# Patient Record
Sex: Female | Born: 1935
Health system: Southern US, Community
[De-identification: ages and names within clinical notes are randomized; demographics above are authoritative.]

## PROBLEM LIST (undated history)

## (undated) DIAGNOSIS — I679 Cerebrovascular disease, unspecified: Secondary | ICD-10-CM

## (undated) DIAGNOSIS — I951 Orthostatic hypotension: Secondary | ICD-10-CM

## (undated) DIAGNOSIS — F32A Depression, unspecified: Secondary | ICD-10-CM

## (undated) DIAGNOSIS — I38 Endocarditis, valve unspecified: Secondary | ICD-10-CM

## (undated) DIAGNOSIS — R42 Dizziness and giddiness: Secondary | ICD-10-CM

## (undated) DIAGNOSIS — E7800 Pure hypercholesterolemia, unspecified: Secondary | ICD-10-CM

## (undated) DIAGNOSIS — I482 Chronic atrial fibrillation, unspecified: Secondary | ICD-10-CM

## (undated) DIAGNOSIS — K529 Noninfective gastroenteritis and colitis, unspecified: Secondary | ICD-10-CM

## (undated) DIAGNOSIS — Z7901 Long term (current) use of anticoagulants: Secondary | ICD-10-CM

## (undated) DIAGNOSIS — I10 Essential (primary) hypertension: Secondary | ICD-10-CM

## (undated) DIAGNOSIS — J189 Pneumonia, unspecified organism: Secondary | ICD-10-CM

## (undated) DIAGNOSIS — K5900 Constipation, unspecified: Secondary | ICD-10-CM

## (undated) DIAGNOSIS — I509 Heart failure, unspecified: Secondary | ICD-10-CM

## (undated) DIAGNOSIS — I4891 Unspecified atrial fibrillation: Secondary | ICD-10-CM

## (undated) DIAGNOSIS — M898X1 Other specified disorders of bone, shoulder: Secondary | ICD-10-CM

## (undated) DIAGNOSIS — B955 Unspecified streptococcus as the cause of diseases classified elsewhere: Secondary | ICD-10-CM

## (undated) DIAGNOSIS — Z9289 Personal history of other medical treatment: Secondary | ICD-10-CM

## (undated) DIAGNOSIS — Z952 Presence of prosthetic heart valve: Secondary | ICD-10-CM

## (undated) DIAGNOSIS — F419 Anxiety disorder, unspecified: Secondary | ICD-10-CM

## (undated) DIAGNOSIS — K219 Gastro-esophageal reflux disease without esophagitis: Secondary | ICD-10-CM

## (undated) DIAGNOSIS — F4323 Adjustment disorder with mixed anxiety and depressed mood: Secondary | ICD-10-CM

## (undated) DIAGNOSIS — I2510 Atherosclerotic heart disease of native coronary artery without angina pectoris: Secondary | ICD-10-CM

## (undated) DIAGNOSIS — M791 Myalgia, unspecified site: Secondary | ICD-10-CM

## (undated) DIAGNOSIS — I330 Acute and subacute infective endocarditis: Secondary | ICD-10-CM

## (undated) DIAGNOSIS — R0789 Other chest pain: Secondary | ICD-10-CM

## (undated) DIAGNOSIS — T826XXA Infection and inflammatory reaction due to cardiac valve prosthesis, initial encounter: Secondary | ICD-10-CM

## (undated) DIAGNOSIS — S92902A Unspecified fracture of left foot, initial encounter for closed fracture: Secondary | ICD-10-CM

## (undated) DIAGNOSIS — F329 Major depressive disorder, single episode, unspecified: Secondary | ICD-10-CM

## (undated) DIAGNOSIS — Z5181 Encounter for therapeutic drug level monitoring: Secondary | ICD-10-CM

## (undated) DIAGNOSIS — L309 Dermatitis, unspecified: Secondary | ICD-10-CM

## (undated) DIAGNOSIS — R413 Other amnesia: Secondary | ICD-10-CM

## (undated) DIAGNOSIS — I639 Cerebral infarction, unspecified: Secondary | ICD-10-CM

## (undated) DIAGNOSIS — R569 Unspecified convulsions: Secondary | ICD-10-CM

## (undated) DIAGNOSIS — R509 Fever, unspecified: Secondary | ICD-10-CM

## (undated) DIAGNOSIS — T50905A Adverse effect of unspecified drugs, medicaments and biological substances, initial encounter: Secondary | ICD-10-CM

## (undated) DIAGNOSIS — I25810 Atherosclerosis of coronary artery bypass graft(s) without angina pectoris: Secondary | ICD-10-CM

## (undated) DIAGNOSIS — M069 Rheumatoid arthritis, unspecified: Secondary | ICD-10-CM

## (undated) DIAGNOSIS — R9089 Other abnormal findings on diagnostic imaging of central nervous system: Secondary | ICD-10-CM

## (undated) HISTORY — DX: Acute and subacute infective endocarditis: I33.0

## (undated) HISTORY — DX: Endocarditis, valve unspecified: I38

## (undated) HISTORY — DX: Infection and inflammatory reaction due to cardiac valve prosthesis, initial encounter: T82.6XXA

## (undated) HISTORY — DX: Cerebrovascular disease, unspecified: I67.9

## (undated) HISTORY — DX: Unspecified convulsions: R56.9

## (undated) HISTORY — PX: CATARACT EXTRACTION W/ INTRAOCULAR LENS IMPLANT: SHX1309

## (undated) HISTORY — DX: Other abnormal findings on diagnostic imaging of central nervous system: R90.89

## (undated) HISTORY — PX: CARDIAC VALVE REPLACEMENT: SHX585

---
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
## 2000-05-27 DIAGNOSIS — I639 Cerebral infarction, unspecified: Secondary | ICD-10-CM

## 2000-05-27 HISTORY — DX: Cerebral infarction, unspecified: I63.9

## 2007-05-28 HISTORY — PX: CORONARY ANGIOPLASTY: SHX604

## 2008-03-27 HISTORY — PX: MITRAL VALVE REPAIR (MV)/CORONARY ARTERY BYPASS GRAFTING (CABG): SHX5983

## 2008-03-27 HISTORY — PX: AORTIC VALVE REPLACEMENT: SHX41

## 2008-03-27 HISTORY — PX: CORONARY ARTERY BYPASS GRAFT: SHX141

## 2008-04-13 ENCOUNTER — Telehealth (INDEPENDENT_AMBULATORY_CARE_PROVIDER_SITE_OTHER): Payer: Self-pay | Admitting: *Deleted

## 2008-04-15 ENCOUNTER — Encounter: Payer: Self-pay | Admitting: Internal Medicine

## 2008-05-31 ENCOUNTER — Ambulatory Visit: Payer: Self-pay | Admitting: Internal Medicine

## 2008-05-31 DIAGNOSIS — I509 Heart failure, unspecified: Secondary | ICD-10-CM

## 2008-05-31 DIAGNOSIS — K5900 Constipation, unspecified: Secondary | ICD-10-CM

## 2008-05-31 DIAGNOSIS — R0789 Other chest pain: Secondary | ICD-10-CM

## 2008-05-31 DIAGNOSIS — M791 Myalgia, unspecified site: Secondary | ICD-10-CM | POA: Insufficient documentation

## 2008-05-31 DIAGNOSIS — I10 Essential (primary) hypertension: Secondary | ICD-10-CM

## 2008-05-31 DIAGNOSIS — I2510 Atherosclerotic heart disease of native coronary artery without angina pectoris: Secondary | ICD-10-CM | POA: Insufficient documentation

## 2008-05-31 DIAGNOSIS — I482 Chronic atrial fibrillation, unspecified: Secondary | ICD-10-CM

## 2008-05-31 HISTORY — DX: Essential (primary) hypertension: I10

## 2008-05-31 HISTORY — DX: Chronic atrial fibrillation, unspecified: I48.20

## 2008-05-31 HISTORY — DX: Myalgia, unspecified site: M79.10

## 2008-05-31 HISTORY — DX: Constipation, unspecified: K59.00

## 2008-05-31 HISTORY — DX: Other chest pain: R07.89

## 2008-05-31 HISTORY — DX: Heart failure, unspecified: I50.9

## 2008-05-31 HISTORY — DX: Atherosclerotic heart disease of native coronary artery without angina pectoris: I25.10

## 2008-06-06 ENCOUNTER — Encounter: Payer: Self-pay | Admitting: Internal Medicine

## 2008-06-27 ENCOUNTER — Telehealth: Payer: Self-pay | Admitting: Internal Medicine

## 2008-07-14 ENCOUNTER — Encounter: Payer: Self-pay | Admitting: Internal Medicine

## 2008-09-13 ENCOUNTER — Telehealth: Payer: Self-pay | Admitting: Internal Medicine

## 2009-04-18 ENCOUNTER — Ambulatory Visit: Payer: Self-pay | Admitting: Internal Medicine

## 2009-04-25 ENCOUNTER — Telehealth: Payer: Self-pay | Admitting: Internal Medicine

## 2009-05-03 ENCOUNTER — Encounter: Payer: Self-pay | Admitting: Internal Medicine

## 2009-07-19 ENCOUNTER — Ambulatory Visit: Payer: Self-pay | Admitting: Internal Medicine

## 2012-03-26 ENCOUNTER — Ambulatory Visit: Payer: Self-pay | Admitting: Internal Medicine

## 2012-03-30 ENCOUNTER — Encounter: Payer: Self-pay | Admitting: Internal Medicine

## 2012-03-30 ENCOUNTER — Ambulatory Visit (INDEPENDENT_AMBULATORY_CARE_PROVIDER_SITE_OTHER): Payer: Self-pay | Admitting: Internal Medicine

## 2012-03-30 VITALS — BP 110/70 | HR 76 | Temp 97.1°F | Resp 16 | Wt 160.0 lb

## 2012-03-30 DIAGNOSIS — Z5181 Encounter for therapeutic drug level monitoring: Secondary | ICD-10-CM

## 2012-03-30 DIAGNOSIS — I509 Heart failure, unspecified: Secondary | ICD-10-CM

## 2012-03-30 DIAGNOSIS — I2510 Atherosclerotic heart disease of native coronary artery without angina pectoris: Secondary | ICD-10-CM

## 2012-03-30 DIAGNOSIS — I10 Essential (primary) hypertension: Secondary | ICD-10-CM

## 2012-03-30 DIAGNOSIS — Z7901 Long term (current) use of anticoagulants: Secondary | ICD-10-CM

## 2012-03-30 HISTORY — DX: Encounter for therapeutic drug level monitoring: Z51.81

## 2012-03-30 MED ORDER — IBUPROFEN 400 MG PO TABS: 400.0000 mg | ORAL_TABLET | Freq: Two times a day (BID) | ORAL | Status: DC | PRN

## 2012-03-30 MED ORDER — BISOPROLOL FUMARATE 5 MG PO TABS: 2.5000 mg | ORAL_TABLET | Freq: Every day | ORAL | Status: DC

## 2012-03-30 MED ORDER — RAMIPRIL 5 MG PO CAPS: 5.0000 mg | ORAL_CAPSULE | Freq: Every day | ORAL | Status: DC

## 2012-03-30 MED ORDER — ATORVASTATIN CALCIUM 10 MG PO TABS: 10.0000 mg | ORAL_TABLET | Freq: Every day | ORAL | Status: DC

## 2012-03-30 MED ORDER — TORSEMIDE 5 MG PO TABS: 5.0000 mg | ORAL_TABLET | Freq: Every day | ORAL | Status: DC

## 2012-03-30 MED ORDER — SPIRONOLACTONE 25 MG PO TABS: 25.0000 mg | ORAL_TABLET | Freq: Every day | ORAL | Status: DC

## 2012-03-30 MED ORDER — WARFARIN SODIUM 2.5 MG PO TABS: ORAL_TABLET | ORAL | Status: DC

## 2012-03-30 NOTE — Progress Notes (Signed)
  Subjective:    Patient ID: Alexis [name], female    DOB: 1934/01/13, 78 y.o.   MRN: [id number]  HPI C/o LLE pain x 3-4 d, resolved on Ibuprofen x 1 dose The patient presents for a follow-up of  chronic hypertension, chronic dyslipidemia, CAD controlled with medicines     Review of Systems  Constitutional: Negative for chills, activity change, appetite change, fatigue and unexpected weight change.  HENT: Negative for congestion, mouth sores and sinus pressure.   Eyes: Negative for visual disturbance.  Respiratory: Negative for cough and chest tightness.   Gastrointestinal: Negative for nausea and abdominal pain.  Genitourinary: Negative for frequency, difficulty urinating and vaginal pain.  Musculoskeletal: Negative for back pain and gait problem.  Skin: Negative for pallor and rash.  Neurological: Negative for dizziness, tremors, weakness, numbness and headaches.  Psychiatric/Behavioral: Negative for confusion and sleep disturbance.       Objective:   Physical Exam  Constitutional: She appears well-developed. No distress.       Obese  HENT:  Head: Normocephalic.  Right Ear: External ear normal.  Left Ear: External ear normal.  Nose: Nose normal.  Mouth/Throat: Oropharynx is clear and moist.  Eyes: Conjunctivae normal are normal. Pupils are equal, round, and reactive to light. Right eye exhibits no discharge. Left eye exhibits no discharge.  Neck: Normal range of motion. Neck supple. No JVD present. No tracheal deviation present. No thyromegaly present.  Cardiovascular: Normal rate, regular rhythm and normal heart sounds.   Pulmonary/Chest: No stridor. No respiratory distress. She has no wheezes.  Abdominal: Soft. Bowel sounds are normal. She exhibits no distension and no mass. There is no tenderness. There is no rebound and no guarding.  Musculoskeletal: She exhibits no edema and no tenderness.  Lymphadenopathy:    She has no cervical adenopathy.  Neurological: She  displays normal reflexes. No cranial nerve deficit. She exhibits normal muscle tone. Coordination normal.  Skin: No rash noted. No erythema.  Psychiatric: She has a normal mood and affect. Her behavior is normal. Judgment and thought content normal.           Assessment & Plan:

## 2012-03-30 NOTE — Progress Notes (Deleted)
Patient ID: Alexis [name], female   DOB: 09-21-1933, 78 y.o.   MRN: [id number]

## 2012-03-30 NOTE — Assessment & Plan Note (Signed)
Continue with current prescription therapy as reflected on the Med list.  

## 2012-04-07 ENCOUNTER — Other Ambulatory Visit: Payer: Self-pay | Admitting: *Deleted

## 2012-04-07 MED ORDER — TORSEMIDE 5 MG PO TABS: 5.0000 mg | ORAL_TABLET | Freq: Every day | ORAL | Status: DC

## 2012-04-10 ENCOUNTER — Other Ambulatory Visit: Payer: Self-pay | Admitting: *Deleted

## 2012-04-10 ENCOUNTER — Telehealth: Payer: Self-pay | Admitting: *Deleted

## 2012-04-10 MED ORDER — BISOPROLOL FUMARATE 5 MG PO TABS: 2.5000 mg | ORAL_TABLET | Freq: Every day | ORAL | Status: DC

## 2012-04-10 MED ORDER — SPIRONOLACTONE 25 MG PO TABS: 25.0000 mg | ORAL_TABLET | Freq: Every day | ORAL | Status: DC

## 2012-04-10 MED ORDER — WARFARIN SODIUM 2.5 MG PO TABS: ORAL_TABLET | ORAL | Status: DC

## 2012-04-10 MED ORDER — IBUPROFEN 400 MG PO TABS: 400.0000 mg | ORAL_TABLET | Freq: Two times a day (BID) | ORAL | Status: DC | PRN

## 2012-04-10 MED ORDER — RAMIPRIL 5 MG PO CAPS: 5.0000 mg | ORAL_CAPSULE | Freq: Every day | ORAL | Status: DC

## 2012-04-10 MED ORDER — ATORVASTATIN CALCIUM 10 MG PO TABS: 10.0000 mg | ORAL_TABLET | Freq: Every day | ORAL | Status: DC

## 2012-04-10 NOTE — Telephone Encounter (Signed)
Pharmacy states pt does not need to be on Coumadin and Ibuprofen.   And also the ramipril with spironolactone could increase her Kcl level. Please advise- does pt need all these meds?

## 2012-04-10 NOTE — Telephone Encounter (Signed)
Kristin informed per Dr. Alain Marion- ok to fill Ramipril and spironolactone. Pt can take ibuprofen prn once weekly but should not take it daily.

## 2012-04-13 ENCOUNTER — Telehealth: Payer: Self-pay | Admitting: Internal Medicine

## 2012-04-13 MED ORDER — DIGOXIN 250 MCG PO TABS: 0.1250 ug | ORAL_TABLET | Freq: Every day | ORAL | Status: DC

## 2012-05-08 ENCOUNTER — Telehealth: Payer: Self-pay | Admitting: Internal Medicine

## 2012-05-08 MED ORDER — DIGOXIN 250 MCG PO TABS: 0.1250 ug | ORAL_TABLET | Freq: Every day | ORAL | Status: DC

## 2012-05-08 NOTE — Telephone Encounter (Signed)
Rf sent

## 2012-05-08 NOTE — Telephone Encounter (Signed)
The patient's daughter called the triage line and is hoping to get a refill of her Digoxin .25mg  , thanks!

## 2012-06-12 ENCOUNTER — Telehealth: Payer: Self-pay | Admitting: *Deleted

## 2012-06-12 NOTE — Telephone Encounter (Signed)
Pt's daughter [name] calling requesting to have coumadin level checked here now. She hasn't had it checked since 02/2012. Jenny Reichmann, Can you call her please?

## 2012-06-15 NOTE — Telephone Encounter (Signed)
LMOM for pt or dtr to call coumadin clinic to schedule INR check.

## 2012-06-17 ENCOUNTER — Other Ambulatory Visit: Payer: Self-pay | Admitting: General Practice

## 2012-06-17 ENCOUNTER — Ambulatory Visit (INDEPENDENT_AMBULATORY_CARE_PROVIDER_SITE_OTHER): Payer: Self-pay | Admitting: General Practice

## 2012-06-17 DIAGNOSIS — Z7901 Long term (current) use of anticoagulants: Secondary | ICD-10-CM | POA: Insufficient documentation

## 2012-06-17 DIAGNOSIS — I2510 Atherosclerotic heart disease of native coronary artery without angina pectoris: Secondary | ICD-10-CM

## 2012-06-29 NOTE — Telephone Encounter (Signed)
x

## 2012-08-14 ENCOUNTER — Ambulatory Visit (INDEPENDENT_AMBULATORY_CARE_PROVIDER_SITE_OTHER): Payer: Self-pay | Admitting: General Practice

## 2012-08-14 DIAGNOSIS — Z7901 Long term (current) use of anticoagulants: Secondary | ICD-10-CM

## 2012-08-14 DIAGNOSIS — I2510 Atherosclerotic heart disease of native coronary artery without angina pectoris: Secondary | ICD-10-CM

## 2012-09-04 ENCOUNTER — Ambulatory Visit (INDEPENDENT_AMBULATORY_CARE_PROVIDER_SITE_OTHER): Payer: Self-pay | Admitting: General Practice

## 2012-09-04 DIAGNOSIS — Z7901 Long term (current) use of anticoagulants: Secondary | ICD-10-CM

## 2012-09-04 DIAGNOSIS — I2510 Atherosclerotic heart disease of native coronary artery without angina pectoris: Secondary | ICD-10-CM

## 2012-09-04 LAB — POCT INR: INR: 2.1

## 2012-09-15 ENCOUNTER — Other Ambulatory Visit: Payer: Self-pay | Admitting: *Deleted

## 2012-09-15 MED ORDER — DIGOXIN 250 MCG PO TABS: 0.1250 ug | ORAL_TABLET | Freq: Every day | ORAL | Status: DC

## 2013-04-16 ENCOUNTER — Encounter: Payer: Self-pay | Admitting: Internal Medicine

## 2013-04-16 ENCOUNTER — Ambulatory Visit (INDEPENDENT_AMBULATORY_CARE_PROVIDER_SITE_OTHER): Payer: Self-pay | Admitting: Internal Medicine

## 2013-04-16 VITALS — BP 122/64 | HR 68 | Temp 96.8°F | Resp 16 | Wt 162.0 lb

## 2013-04-16 DIAGNOSIS — Z23 Encounter for immunization: Secondary | ICD-10-CM

## 2013-04-16 DIAGNOSIS — I10 Essential (primary) hypertension: Secondary | ICD-10-CM

## 2013-04-16 DIAGNOSIS — I2510 Atherosclerotic heart disease of native coronary artery without angina pectoris: Secondary | ICD-10-CM

## 2013-04-16 DIAGNOSIS — I509 Heart failure, unspecified: Secondary | ICD-10-CM

## 2013-04-16 MED ORDER — MECLIZINE HCL 12.5 MG PO TABS: 12.5000 mg | ORAL_TABLET | Freq: Three times a day (TID) | ORAL | Status: DC | PRN

## 2013-04-16 MED ORDER — DIGOXIN 250 MCG PO TABS: 0.1250 ug | ORAL_TABLET | Freq: Every day | ORAL | Status: DC

## 2013-04-16 NOTE — Progress Notes (Signed)
  Subjective:    HPI   C/o LLE pain x 3-4 d, resolved on Ibuprofen x 1 dose The patient presents for a follow-up of  chronic hypertension, chronic dyslipidemia, CAD controlled with medicines     Review of Systems  Constitutional: Negative for chills, activity change, appetite change, fatigue and unexpected weight change.  HENT: Negative for congestion, mouth sores and sinus pressure.   Eyes: Negative for visual disturbance.  Respiratory: Negative for cough and chest tightness.   Gastrointestinal: Negative for nausea and abdominal pain.  Genitourinary: Negative for frequency, difficulty urinating and vaginal pain.  Musculoskeletal: Negative for back pain and gait problem.  Skin: Negative for pallor and rash.  Neurological: Negative for dizziness, tremors, weakness, numbness and headaches.  Psychiatric/Behavioral: Negative for confusion and sleep disturbance.       Objective:   Physical Exam  Constitutional: She appears well-developed. No distress.  Obese  HENT:  Head: Normocephalic.  Right Ear: External ear normal.  Left Ear: External ear normal.  Nose: Nose normal.  Mouth/Throat: Oropharynx is clear and moist.  Eyes: Conjunctivae are normal. Pupils are equal, round, and reactive to light. Right eye exhibits no discharge. Left eye exhibits no discharge.  Neck: Normal range of motion. Neck supple. No JVD present. No tracheal deviation present. No thyromegaly present.  Cardiovascular: Normal rate, regular rhythm and normal heart sounds.   Pulmonary/Chest: No stridor. No respiratory distress. She has no wheezes.  Abdominal: Soft. Bowel sounds are normal. She exhibits no distension and no mass. There is no tenderness. There is no rebound and no guarding.  Musculoskeletal: She exhibits no edema and no tenderness.  Lymphadenopathy:    She has no cervical adenopathy.  Neurological: She displays normal reflexes. No cranial nerve deficit. She exhibits normal muscle tone.  Coordination normal.  Skin: No rash noted. No erythema.  Psychiatric: She has a normal mood and affect. Her behavior is normal. Judgment and thought content normal.           Assessment & Plan:

## 2013-04-16 NOTE — Progress Notes (Signed)
Pre visit review using our clinic review tool, if applicable. No additional management support is needed unless otherwise documented below in the visit note. 

## 2013-04-17 NOTE — Assessment & Plan Note (Signed)
Continue with current prescription therapy as reflected on the Med list.  

## 2013-06-07 ENCOUNTER — Other Ambulatory Visit: Payer: Self-pay | Admitting: General Practice

## 2013-06-07 ENCOUNTER — Telehealth: Payer: Self-pay | Admitting: General Practice

## 2013-06-08 ENCOUNTER — Telehealth: Payer: Self-pay | Admitting: General Practice

## 2013-06-08 NOTE — Telephone Encounter (Signed)
[facility] for patient to return call to coumadin clinic @ [phone].

## 2013-06-11 NOTE — Telephone Encounter (Signed)
LMOM for patient to call office to schedule appointment for coumadin clinic.

## 2013-07-06 ENCOUNTER — Other Ambulatory Visit: Payer: Self-pay | Admitting: General Practice

## 2013-07-06 ENCOUNTER — Ambulatory Visit (INDEPENDENT_AMBULATORY_CARE_PROVIDER_SITE_OTHER): Payer: Self-pay | Admitting: General Practice

## 2013-07-06 DIAGNOSIS — Z5181 Encounter for therapeutic drug level monitoring: Secondary | ICD-10-CM

## 2013-07-06 DIAGNOSIS — I2510 Atherosclerotic heart disease of native coronary artery without angina pectoris: Secondary | ICD-10-CM

## 2013-07-06 LAB — POCT INR: INR: 2.4

## 2013-07-06 MED ORDER — WARFARIN SODIUM 2.5 MG PO TABS: ORAL_TABLET | ORAL | Status: DC

## 2013-07-06 NOTE — Progress Notes (Signed)
Pre-visit discussion using our clinic review tool. No additional management support is needed unless otherwise documented below in the visit note.  

## 2013-08-05 ENCOUNTER — Telehealth: Payer: Self-pay | Admitting: *Deleted

## 2013-08-05 MED ORDER — SPIRONOLACTONE 25 MG PO TABS: 25.0000 mg | ORAL_TABLET | Freq: Every day | ORAL | Status: DC

## 2013-08-05 NOTE — Telephone Encounter (Signed)
Daughter phoned requesting refill for aldactone.  Refilled per protocol.

## 2013-08-09 ENCOUNTER — Other Ambulatory Visit: Payer: Self-pay | Admitting: Internal Medicine

## 2013-08-13 ENCOUNTER — Telehealth: Payer: Self-pay

## 2013-08-13 NOTE — Telephone Encounter (Signed)
Ok next wk Thx

## 2013-08-13 NOTE — Telephone Encounter (Signed)
Phone call from patient's daughter [phone] stating patient is showing signs of dementia. She asks is you next available appt okay or do you want to see patient sooner? Please advise.

## 2013-08-16 ENCOUNTER — Ambulatory Visit (INDEPENDENT_AMBULATORY_CARE_PROVIDER_SITE_OTHER): Payer: Self-pay | Admitting: Internal Medicine

## 2013-08-16 ENCOUNTER — Encounter: Payer: Self-pay | Admitting: Internal Medicine

## 2013-08-16 VITALS — BP 122/84 | HR 43 | Temp 97.5°F | Resp 16 | Ht 59.0 in | Wt 153.0 lb

## 2013-08-16 DIAGNOSIS — I10 Essential (primary) hypertension: Secondary | ICD-10-CM

## 2013-08-16 DIAGNOSIS — I509 Heart failure, unspecified: Secondary | ICD-10-CM

## 2013-08-16 DIAGNOSIS — I2510 Atherosclerotic heart disease of native coronary artery without angina pectoris: Secondary | ICD-10-CM

## 2013-08-16 DIAGNOSIS — R413 Other amnesia: Secondary | ICD-10-CM

## 2013-08-16 HISTORY — DX: Other amnesia: R41.3

## 2013-08-16 NOTE — Assessment & Plan Note (Signed)
Mild. She is leaving for [place] tomorrow. Options to treat was discussed.

## 2013-08-16 NOTE — Assessment & Plan Note (Signed)
Compensated. Continue with current prescription therapy as reflected on the Med list.

## 2013-08-16 NOTE — Progress Notes (Signed)
   Subjective:    HPI  C/o memory loss  LLE pain x 3-4 d, resolved on Ibuprofen x 1 dose The patient presents for a follow-up of  chronic hypertension, chronic dyslipidemia, CAD controlled with medicines     Review of Systems  Constitutional: Negative for chills, activity change, appetite change, fatigue and unexpected weight change.  HENT: Negative for congestion, mouth sores and sinus pressure.   Eyes: Negative for visual disturbance.  Respiratory: Negative for cough and chest tightness.   Gastrointestinal: Negative for nausea and abdominal pain.  Genitourinary: Negative for frequency, difficulty urinating and vaginal pain.  Musculoskeletal: Negative for back pain and gait problem.  Skin: Negative for pallor and rash.  Neurological: Negative for dizziness, tremors, weakness, numbness and headaches.  Psychiatric/Behavioral: Negative for confusion and sleep disturbance.  memory loss     Objective:   Physical Exam  Constitutional: She appears well-developed. No distress.  Obese  HENT:  Head: Normocephalic.  Right Ear: External ear normal.  Left Ear: External ear normal.  Nose: Nose normal.  Mouth/Throat: Oropharynx is clear and moist.  Eyes: Conjunctivae are normal. Pupils are equal, round, and reactive to light. Right eye exhibits no discharge. Left eye exhibits no discharge.  Neck: Normal range of motion. Neck supple. No JVD present. No tracheal deviation present. No thyromegaly present.  Cardiovascular: Normal rate, regular rhythm and normal heart sounds.   Pulmonary/Chest: No stridor. No respiratory distress. She has no wheezes.  Abdominal: Soft. Bowel sounds are normal. She exhibits no distension and no mass. There is no tenderness. There is no rebound and no guarding.  Musculoskeletal: She exhibits no edema and no tenderness.  Lymphadenopathy:    She has no cervical adenopathy.  Neurological: She displays normal reflexes. No cranial nerve deficit. She exhibits normal  muscle tone. Coordination normal.  Skin: No rash noted. No erythema.  Psychiatric: She has a normal mood and affect. Her behavior is normal. Thought content normal.  Serial 7s WNL Clock drawing impaired A/o/c x3           Assessment & Plan:

## 2013-08-16 NOTE — Assessment & Plan Note (Signed)
Continue with current prescription therapy as reflected on the Med list.  

## 2013-08-16 NOTE — Patient Instructions (Signed)
Return when you are back in [place]

## 2013-08-16 NOTE — Progress Notes (Signed)
Pre visit review using our clinic review tool, if applicable. No additional management support is needed unless otherwise documented below in the visit note. 

## 2013-08-17 ENCOUNTER — Telehealth: Payer: Self-pay | Admitting: Internal Medicine

## 2013-08-17 NOTE — Telephone Encounter (Signed)
Relevant patient education mailed to patient.  

## 2013-08-31 ENCOUNTER — Ambulatory Visit (INDEPENDENT_AMBULATORY_CARE_PROVIDER_SITE_OTHER): Payer: Self-pay | Admitting: General Practice

## 2013-08-31 DIAGNOSIS — I2510 Atherosclerotic heart disease of native coronary artery without angina pectoris: Secondary | ICD-10-CM

## 2013-08-31 DIAGNOSIS — Z5181 Encounter for therapeutic drug level monitoring: Secondary | ICD-10-CM

## 2013-08-31 NOTE — Progress Notes (Signed)
Pre visit review using our clinic review tool, if applicable. No additional management support is needed unless otherwise documented below in the visit note. 

## 2014-06-20 ENCOUNTER — Other Ambulatory Visit (INDEPENDENT_AMBULATORY_CARE_PROVIDER_SITE_OTHER): Payer: Self-pay

## 2014-06-20 ENCOUNTER — Ambulatory Visit (INDEPENDENT_AMBULATORY_CARE_PROVIDER_SITE_OTHER): Payer: Self-pay | Admitting: Internal Medicine

## 2014-06-20 VITALS — BP 128/80 | HR 49 | Wt 158.0 lb

## 2014-06-20 DIAGNOSIS — I951 Orthostatic hypotension: Secondary | ICD-10-CM

## 2014-06-20 DIAGNOSIS — K529 Noninfective gastroenteritis and colitis, unspecified: Secondary | ICD-10-CM

## 2014-06-20 DIAGNOSIS — E871 Hypo-osmolality and hyponatremia: Secondary | ICD-10-CM

## 2014-06-20 HISTORY — DX: Orthostatic hypotension: I95.1

## 2014-06-20 HISTORY — DX: Noninfective gastroenteritis and colitis, unspecified: K52.9

## 2014-06-20 LAB — BASIC METABOLIC PANEL
BUN: 65 mg/dL — ABNORMAL HIGH (ref 6–23)
CO2: 18 mEq/L — ABNORMAL LOW (ref 19–32)
CREATININE: 1.66 mg/dL — AB (ref 0.40–1.20)
Calcium: 9.5 mg/dL (ref 8.4–10.5)
Chloride: 93 mEq/L — ABNORMAL LOW (ref 96–112)
GFR: 31.74 mL/min — ABNORMAL LOW (ref >60.00)
Glucose, Bld: 117 mg/dL — ABNORMAL HIGH (ref 70–99)
Potassium: 4.6 mEq/L (ref 3.5–5.1)
SODIUM: 121 meq/L — AB (ref 135–145)

## 2014-06-20 LAB — MAGNESIUM: MAGNESIUM: 2.4 mg/dL (ref 1.5–2.5)

## 2014-06-20 LAB — CBC
HCT: 37.6 % (ref 36.0–46.0)
Hemoglobin: 13.4 g/dL (ref 12.0–15.0)
MCHC: 35.5 g/dL (ref 30.0–36.0)
MCV: 83.4 fl (ref 78.0–100.0)
Platelets: 368 10*3/uL (ref 150.0–400.0)
RBC: 4.51 Mil/uL (ref 3.87–5.11)
RDW: 12.8 % (ref 11.5–15.5)
WBC: 9.8 10*3/uL (ref 4.0–10.5)

## 2014-06-20 MED ORDER — VITAMIN D 1000 UNITS PO TABS: 1000.0000 [IU] | ORAL_TABLET | Freq: Every day | ORAL | Status: AC

## 2014-06-20 NOTE — Patient Instructions (Addendum)
  Hold Ramipril and Spironolactone x 3 days ?????? (Diarrhea) ?????? -- ??? ?????? ?????? ????. ?????? ????? ????? ???????? ????????????? ?????????. ????????????? ???????? ? ?????? ??? ? ??????? ?????, ??????? ?? ???, ?????????? ??????????????, ???? ????? ??????????? ?????? ???????. ?????? ???????? ????????? ??????-?? ???????????, ???????? ????? -- ????????????????? ????????. ? ??????????? ??????? ?????? ???????????? 2-3 ???. ?????? ?????? ?????? ????? ???????????? ??????, ???? ??? ???????? ????????? ?????????? ???????????. ????? ?????? ?????? ? ???????????? ? ???????????? ????? ??? ????, ????? ????????? ??????????? ??? ?? ????????? ?????????? ???????? ??????.  ???????  ????????? ???????????????? ???????:   ???????? ?????????-????????? ??????, ????????, ????????, ?????????????, ??? ????????? ??????????.  ??????? ?????????? ??? ???????? ?? ???????? ???????.  ????? ????????? ??????????, ????????, ????????????, ????????????, ????????????.  ???????????? ? ???? ??????????? ?????? ? ????????.  ???????????? ???????????. ?????????? ?? ????? ? ???????? ????????   ?????????? ?????????? ??????????? ????????? ???????? (????????????): ????? ??????? ???????? ?????? ?????????? ???????? 1 ?????? (8 ?????) ????????. ?? ????? ????????, ??????? ???????? ??????? ?????? ??? ?????????? ???????, ????????? ????, ?????????????? ???????? ? ???????????? ???????. ???? ???? ?????? ?????????? ?????????? ??? ?????? ??????, ???? ?? ????? ??????????? ?????????? ????????. ????? ??????????? ???????????????? ???????, ??????? ????? ?????? ? ??????, ? ???????????? ??? ?? ?????????. ?? ?????? ?????????????? ?????????? ???????????????? ???????, ?????? ? ????????? ? ???? ????????? ??????????:  ?-? ?????? ????? ????.   ?????? ????? ??????? ????.  ? ?????? ????? ?????????? ????, ??????????? ?????? ?????.  1 ? ???????? ????? ??????.  1 ???? (34 ?????) ????.  ????????? ???????? ??????? ? ??????? ????? ??????? ????????? ?????  ?????????-???????? ????? (???). ????????????? ???????? ? ??????? ?? ??????? ????????? ? ???? ??? ??????:  ???????? ??????? ? ???????, ?????????? ??????.  ???????? ? ??????? ??????????? ?????????, ????????, ????? ?????? ? ?????, ?????, ???????, ?????-???????? ??????? ?? ???????? ???? ? ???????? ????.  ???????? ??????? ? ???????, ?????????? ????? ?????????????, ??? ????????, ???????? ? ????????.  ????????? ???????? ??????? ????? ?????? ??????????? ? ????????? ????:  ????????, ?????????? ???????, ????????, ???, ?????, ?????????? ???????, ???? ??? ??????, ???????, ??????? ???? ??????? ??????, ?????????? ?????????, ???????, ?????.  ??????.  ???????? ????.  ??????? ? ?????? ??????? ????????, ?????????? ??????????, ????? ????????? ???????????? ???????? ???????? ? ???, ????????, ?????? ? ????????????? ????????.  ????? ??????? ????????? ??????? ????????? ????? ???? ? ?????.  ?????????? ?????? ?????????? ?????? ?????????????? ??? ??????????? ?????????.  ?????????? ?????? ?????? ??? ?????????? ?????? ??? ???? ? ????? ??-?? ??????? ??????. ?????????? ?????????? ? ????? ??? ????????? ?????? ??????, ????:   ???????? ????????? ????????.  ???????? ??????????? ?????.  ? ????? ???????????? ?????, ??? ??????? ????? ??????? ? ??????????????.  ? ??? ?? ???? ?????????????? ?? ????????? 6-8 ?????, ??? ???? ?????????????? ? ????? ??????????? ???? ??? ????? ????? ??????? ?????.  ? ??? ????????? ???? ? ??????? ???????, ??????? ??????????? ??? ????????????.  ?? ?????????? ????????, ??????????????, ?????????? ???????? ??? ???????? ? ??????.  ? ??? ????????? ?????? ???????? ????.  ?????? ???????????.  ?????????? ??????????? ??? ?????????? ???????? ???????????? ?????? 2-3 ????.  ? ??? ???????? ?????????, ?/??? ???????? ???????? ??????????. ?????????, ??? ??:   ????????? ????????? ??????????.  ?????? ?????????????? ??????? ?? ??????????.  ??????????????? ?????????? ? ?????, ???? ??? ?? ??????????  ????? ??? ?????????? ????. Document Released: 03/10/2009 Document Revised: 04/29/2012 [facility] Patient Information 2015 [place], [place]. This information is not intended to replace advice given to you by your health care provider. Make sure you discuss any questions you have with your health care provider.

## 2014-06-20 NOTE — Progress Notes (Signed)
Pre visit review using our clinic review tool, if applicable. No additional management support is needed unless otherwise documented below in the visit note. 

## 2014-06-20 NOTE — Progress Notes (Signed)
   Subjective:    Diarrhea  This is a new problem. The current episode started in the past 7 days. The problem occurs 2 to 4 times per day. The problem has been resolved (stopped after 3 days). Associated symptoms include chills. Pertinent negatives include no abdominal pain, coughing, fever or headaches.  C/o nausea, poor appetite  F/u memory loss  LLE pain x 3-4 d, resolved on Ibuprofen x 1 dose The patient presents for a follow-up of  chronic hypertension, chronic dyslipidemia, CAD controlled with medicines  BP Readings from Last 3 Encounters:  06/20/14 128/80  08/16/13 122/84  04/16/13 122/64   BP supine 120/80, standing 100/70    Review of Systems  Constitutional: Positive for chills. Negative for fever, activity change, appetite change, fatigue and unexpected weight change.  HENT: Negative for congestion, mouth sores and sinus pressure.   Eyes: Negative for visual disturbance.  Respiratory: Negative for cough and chest tightness.   Gastrointestinal: Positive for diarrhea. Negative for nausea and abdominal pain.  Genitourinary: Negative for frequency, difficulty urinating and vaginal pain.  Musculoskeletal: Negative for back pain and gait problem.  Skin: Negative for pallor and rash.  Neurological: Negative for dizziness, tremors, weakness, numbness and headaches.  Psychiatric/Behavioral: Negative for confusion and sleep disturbance.  memory loss     Objective:   Physical Exam  Constitutional: She appears well-developed. No distress.  Obese  HENT:  Head: Normocephalic.  Right Ear: External ear normal.  Left Ear: External ear normal.  Nose: Nose normal.  Mouth/Throat: Oropharynx is clear and moist.  Eyes: Conjunctivae are normal. Pupils are equal, round, and reactive to light. Right eye exhibits no discharge. Left eye exhibits no discharge.  Neck: Normal range of motion. Neck supple. No JVD present. No tracheal deviation present. No thyromegaly present.   Cardiovascular: Normal rate, regular rhythm and normal heart sounds.   Pulmonary/Chest: No stridor. No respiratory distress. She has no wheezes.  Abdominal: Soft. Bowel sounds are normal. She exhibits no distension and no mass. There is no tenderness. There is no rebound and no guarding.  Musculoskeletal: She exhibits no edema or tenderness.  Lymphadenopathy:    She has no cervical adenopathy.  Neurological: She displays normal reflexes. No cranial nerve deficit. She exhibits normal muscle tone. Coordination normal.  Skin: No rash noted. No erythema.  Psychiatric: She has a normal mood and affect. Her behavior is normal. Thought content normal.  Serial 7s WNL Clock drawing impaired A/o/c x3   Lab Results  Component Value Date   WBC 9.8 06/20/2014   HGB 13.4 06/20/2014   HCT 37.6 06/20/2014   PLT 368.0 06/20/2014   GLUCOSE 117* 06/20/2014   NA 121* 06/20/2014   K 4.6 06/20/2014   CL 93* 06/20/2014   CREATININE 1.66* 06/20/2014   BUN 65* 06/20/2014   CO2 18* 06/20/2014   INR 2.4 07/06/2013         Assessment & Plan:

## 2014-06-20 NOTE — Assessment & Plan Note (Signed)
Likely viral 1/16 Hold Ramipril and Spironolactone x 3 days Labs

## 2014-06-21 ENCOUNTER — Telehealth: Payer: Self-pay | Admitting: *Deleted

## 2014-06-21 ENCOUNTER — Other Ambulatory Visit: Payer: Self-pay | Admitting: Internal Medicine

## 2014-06-21 DIAGNOSIS — Z5181 Encounter for therapeutic drug level monitoring: Secondary | ICD-10-CM

## 2014-06-21 DIAGNOSIS — E871 Hypo-osmolality and hyponatremia: Secondary | ICD-10-CM

## 2014-06-21 LAB — DIGOXIN LEVEL: Digoxin Level: 2.8 ng/mL (ref 0.8–2.0)

## 2014-06-21 NOTE — Telephone Encounter (Signed)
P/t's daughter informed

## 2014-06-21 NOTE — Telephone Encounter (Signed)
Pt's dogoxin is critically high at 2.8.  Per MD- stop Digoxin. And repeat lab in 1 week. Left mess for patient's daughter to call back.

## 2014-06-21 NOTE — Assessment & Plan Note (Signed)
1/16 due to diarrhea, meds I left a VM on dtr's  Repeat Labs in 1 wk

## 2014-07-05 ENCOUNTER — Other Ambulatory Visit (INDEPENDENT_AMBULATORY_CARE_PROVIDER_SITE_OTHER): Payer: Self-pay

## 2014-07-05 DIAGNOSIS — E871 Hypo-osmolality and hyponatremia: Secondary | ICD-10-CM

## 2014-07-05 DIAGNOSIS — Z5181 Encounter for therapeutic drug level monitoring: Secondary | ICD-10-CM

## 2014-07-05 LAB — BASIC METABOLIC PANEL
BUN: 33 mg/dL — ABNORMAL HIGH (ref 6–23)
CO2: 23 meq/L (ref 19–32)
CREATININE: 1.34 mg/dL — AB (ref 0.40–1.20)
Calcium: 9.7 mg/dL (ref 8.4–10.5)
Chloride: 106 mEq/L (ref 96–112)
GFR: 40.64 mL/min — AB (ref >60.00)
Glucose, Bld: 101 mg/dL — ABNORMAL HIGH (ref 70–99)
POTASSIUM: 4.4 meq/L (ref 3.5–5.1)
Sodium: 138 mEq/L (ref 135–145)

## 2014-07-06 ENCOUNTER — Telehealth: Payer: Self-pay | Admitting: Internal Medicine

## 2014-07-06 LAB — DIGOXIN LEVEL: DIGOXIN LVL: 0.1 ng/mL — AB (ref 0.8–2.0)

## 2014-07-06 NOTE — Telephone Encounter (Signed)
Left detailed message per lab result note that all labs are better and to call if any questions.

## 2014-07-06 NOTE — Telephone Encounter (Signed)
Patient has called back in regards to her lab results.

## 2014-07-06 NOTE — Telephone Encounter (Signed)
Patient is requesting a detailed message if she does not answer.

## 2014-07-07 ENCOUNTER — Telehealth: Payer: Self-pay | Admitting: Internal Medicine

## 2014-07-07 NOTE — Telephone Encounter (Signed)
pls see Mychart lab note/message Thx

## 2014-07-07 NOTE — Telephone Encounter (Signed)
With lab results that just came back, patient wants to know if digoxin (LANOXIN) 0.25 MG tablet should continue to be taken

## 2014-07-08 NOTE — Telephone Encounter (Signed)
Called pt daughter no answer LMOM with md response...Alexis [name]

## 2014-08-05 ENCOUNTER — Telehealth: Payer: Self-pay

## 2014-08-08 NOTE — Telephone Encounter (Signed)
Call to confirm status of flu; Patient stated she had the flu this year and declines for now.

## 2014-08-16 ENCOUNTER — Telehealth: Payer: Self-pay | Admitting: Internal Medicine

## 2014-08-16 NOTE — Telephone Encounter (Signed)
Pt daughter called in and would like her last 2 lab result mailed to home address

## 2014-08-16 NOTE — Telephone Encounter (Signed)
Mailed last labs done in January.Marland KitchenJohny [name]

## 2014-08-29 ENCOUNTER — Telehealth: Payer: Self-pay | Admitting: Internal Medicine

## 2014-08-29 MED ORDER — BISOPROLOL FUMARATE 5 MG PO TABS: 2.5000 mg | ORAL_TABLET | Freq: Every day | ORAL | Status: DC

## 2014-08-29 NOTE — Telephone Encounter (Signed)
Patient is requesting a refill of bisoprolol (ZEBETA) 5 MG tablet [id number]. Verified pharmacy is [facility] in [place]

## 2014-08-29 NOTE — Telephone Encounter (Signed)
Rf sent

## 2014-09-07 ENCOUNTER — Other Ambulatory Visit: Payer: Self-pay | Admitting: *Deleted

## 2014-09-07 MED ORDER — WARFARIN SODIUM 2.5 MG PO TABS: ORAL_TABLET | ORAL | Status: DC

## 2014-09-07 MED ORDER — SPIRONOLACTONE 25 MG PO TABS: 25.0000 mg | ORAL_TABLET | Freq: Every day | ORAL | Status: DC

## 2014-09-07 MED ORDER — ATORVASTATIN CALCIUM 10 MG PO TABS
10.0000 mg | ORAL_TABLET | Freq: Every day | ORAL | Status: DC
Start: 2014-09-07 — End: 2016-09-02

## 2016-04-05 ENCOUNTER — Ambulatory Visit: Payer: Self-pay | Admitting: Internal Medicine

## 2016-05-27 DIAGNOSIS — I38 Endocarditis, valve unspecified: Secondary | ICD-10-CM | POA: Insufficient documentation

## 2016-06-04 ENCOUNTER — Telehealth: Payer: Self-pay | Admitting: Internal Medicine

## 2016-06-04 NOTE — Telephone Encounter (Signed)
Daughter states she took patient to the ED today for chest pain.  Went to [facility].  States they told patient she had a muscle strain.  States patient has gotten worse since she has left the ED.  I have scheduled patient for the 19th but daughter is requesting Dr. Camila Li to try to work patient in sooner.  Please advise.

## 2016-06-04 NOTE — Telephone Encounter (Signed)
OK 11:30 on Thursday Thx

## 2016-06-05 NOTE — Telephone Encounter (Signed)
Got patient scheduled

## 2016-06-06 ENCOUNTER — Ambulatory Visit (INDEPENDENT_AMBULATORY_CARE_PROVIDER_SITE_OTHER): Payer: Self-pay | Admitting: Internal Medicine

## 2016-06-06 ENCOUNTER — Encounter: Payer: Self-pay | Admitting: Internal Medicine

## 2016-06-06 DIAGNOSIS — M898X1 Other specified disorders of bone, shoulder: Secondary | ICD-10-CM

## 2016-06-06 DIAGNOSIS — R0789 Other chest pain: Secondary | ICD-10-CM

## 2016-06-06 DIAGNOSIS — L309 Dermatitis, unspecified: Secondary | ICD-10-CM

## 2016-06-06 DIAGNOSIS — I2510 Atherosclerotic heart disease of native coronary artery without angina pectoris: Secondary | ICD-10-CM

## 2016-06-06 HISTORY — DX: Other specified disorders of bone, shoulder: M89.8X1

## 2016-06-06 HISTORY — DX: Dermatitis, unspecified: L30.9

## 2016-06-06 MED ORDER — TORSEMIDE 5 MG PO TABS: 2.5000 mg | ORAL_TABLET | Freq: Every day | ORAL | 11 refills | Status: DC

## 2016-06-06 MED ORDER — VITAMIN D3 50 MCG (2000 UT) PO CAPS: 2000.0000 [IU] | ORAL_CAPSULE | Freq: Every day | ORAL | 3 refills | Status: DC

## 2016-06-06 MED ORDER — LORATADINE 10 MG PO TABS: 10.0000 mg | ORAL_TABLET | Freq: Every day | ORAL | 3 refills | Status: DC

## 2016-06-06 MED ORDER — TRIAMCINOLONE ACETONIDE 0.5 % EX OINT: 1.0000 "application " | TOPICAL_OINTMENT | Freq: Two times a day (BID) | CUTANEOUS | 3 refills | Status: DC

## 2016-06-06 NOTE — Assessment & Plan Note (Signed)
L MSK after raking leaves No CP now Tylenol or T#3 prn

## 2016-06-06 NOTE — Assessment & Plan Note (Signed)
No angina - doing well

## 2016-06-06 NOTE — Progress Notes (Signed)
Pre visit review using our clinic review tool, if applicable. No additional management support is needed unless otherwise documented below in the visit note. 

## 2016-06-06 NOTE — Progress Notes (Signed)
Subjective:  Patient ID: Alexis [name], female    DOB: 03/14/1934  Age: 83 y.o. MRN: [id number]  CC: No chief complaint on file.   HPI Alexis [name] presents for L shoulder blade pain off and on x 1 mo. Worse w/shoulder ROMin the past few days. She was seen at the [facility] on 06/04/16. CXR was ok. EKG was ok however Alexis [name] does not recall it being done... No sx's now  Outpatient Medications Prior to Visit  Medication Sig Dispense Refill  . atorvastatin (LIPITOR) 10 MG tablet Take 1 tablet (10 mg total) by mouth daily. 90 tablet 3  . bisoprolol (ZEBETA) 5 MG tablet Take 0.5 tablets (2.5 mg total) by mouth daily. 90 tablet 1  . digoxin (LANOXIN) 0.25 MG tablet Take 0.5 tablets (125 mcg total) by mouth daily. 45 tablet 3  . ibuprofen (ADVIL,MOTRIN) 400 MG tablet Take 1 tablet (400 mg total) by mouth 2 (two) times daily as needed for pain. 60 tablet 3  . ramipril (ALTACE) 5 MG capsule Take 1 capsule (5 mg total) by mouth daily. 90 capsule 3  . spironolactone (ALDACTONE) 25 MG tablet Take 1 tablet (25 mg total) by mouth daily. 90 tablet 3  . warfarin (COUMADIN) 2.5 MG tablet Take as directed by anticoagulation clinic 90 tablet 0   No facility-administered medications prior to visit.     ROS Review of Systems  Constitutional: Negative for activity change, appetite change, chills, fatigue and unexpected weight change.  HENT: Negative for congestion, mouth sores and sinus pressure.   Eyes: Negative for visual disturbance.  Respiratory: Negative for cough and chest tightness.   Gastrointestinal: Negative for abdominal pain and nausea.  Genitourinary: Negative for difficulty urinating, frequency and vaginal pain.  Musculoskeletal: Negative for back pain and gait problem.  Skin: Positive for rash. Negative for pallor.  Neurological: Negative for dizziness, tremors, weakness, numbness and headaches.  Psychiatric/Behavioral: Negative for confusion and sleep disturbance.     Objective:  BP 100/60   Pulse 75   Wt 160 lb (72.6 kg)   SpO2 97%   BMI 32.32 kg/m   BP Readings from Last 3 Encounters:  06/06/16 100/60  06/20/14 128/80  08/16/13 122/84    Wt Readings from Last 3 Encounters:  06/06/16 160 lb (72.6 kg)  06/20/14 158 lb (71.7 kg)  08/16/13 153 lb (69.4 kg)    Physical Exam  Constitutional: She appears well-developed. No distress.  HENT:  Head: Normocephalic.  Right Ear: External ear normal.  Left Ear: External ear normal.  Nose: Nose normal.  Mouth/Throat: Oropharynx is clear and moist.  Eyes: Conjunctivae are normal. Pupils are equal, round, and reactive to light. Right eye exhibits no discharge. Left eye exhibits no discharge.  Neck: Normal range of motion. Neck supple. No JVD present. No tracheal deviation present. No thyromegaly present.  Cardiovascular: Normal rate, regular rhythm and normal heart sounds.   Pulmonary/Chest: No stridor. No respiratory distress. She has no wheezes.  Abdominal: Soft. Bowel sounds are normal. She exhibits no distension and no mass. There is no tenderness. There is no rebound and no guarding.  Musculoskeletal: She exhibits no edema or tenderness.  Lymphadenopathy:    She has no cervical adenopathy.  Neurological: She displays normal reflexes. No cranial nerve deficit. She exhibits normal muscle tone. Coordination normal.  Skin: Rash noted. No erythema.  Psychiatric: She has a normal mood and affect. Her behavior is normal. Judgment and thought content normal.   Rash on back B -  eczema patches  Lab Results  Component Value Date   WBC 9.8 06/20/2014   HGB 13.4 06/20/2014   HCT 37.6 06/20/2014   PLT 368.0 06/20/2014   GLUCOSE 101 (H) 07/05/2014   NA 138 07/05/2014   K 4.4 07/05/2014   CL 106 07/05/2014   CREATININE 1.34 (H) 07/05/2014   BUN 33 (H) 07/05/2014   CO2 23 07/05/2014   INR 2.4 07/06/2013    No results found.  Assessment & Plan:   There are no diagnoses linked to this  encounter. I am having Alexis [name] maintain her ibuprofen, ramipril, digoxin, bisoprolol, warfarin, spironolactone, and atorvastatin.  No orders of the defined types were placed in this encounter.    Follow-up: No Follow-up on file.  [name], MD

## 2016-06-06 NOTE — Assessment & Plan Note (Signed)
Triamc oint 

## 2016-06-06 NOTE — Assessment & Plan Note (Signed)
Tylenol prn 

## 2016-06-14 ENCOUNTER — Ambulatory Visit: Payer: Self-pay | Admitting: Internal Medicine

## 2016-06-19 ENCOUNTER — Telehealth: Payer: Self-pay | Admitting: Emergency Medicine

## 2016-06-19 DIAGNOSIS — R509 Fever, unspecified: Secondary | ICD-10-CM

## 2016-06-19 NOTE — Telephone Encounter (Signed)
Pts daughter called and stated her mother was seen on 06/06/16 and is still having symptoms. She is running a fever now. I let her know Dr Camila Li might want her to come in and be seen again but she said no she wants to know if you can give her a call back thanks.

## 2016-06-20 ENCOUNTER — Other Ambulatory Visit (INDEPENDENT_AMBULATORY_CARE_PROVIDER_SITE_OTHER): Payer: Self-pay

## 2016-06-20 DIAGNOSIS — R509 Fever, unspecified: Secondary | ICD-10-CM

## 2016-06-20 LAB — HEPATIC FUNCTION PANEL
ALBUMIN: 3.4 g/dL — AB (ref 3.5–5.2)
ALK PHOS: 77 U/L (ref 39–117)
ALT: 24 U/L (ref 0–35)
AST: 25 U/L (ref 0–37)
BILIRUBIN DIRECT: 0.2 mg/dL (ref 0.0–0.3)
TOTAL PROTEIN: 7.5 g/dL (ref 6.0–8.3)
Total Bilirubin: 0.7 mg/dL (ref 0.2–1.2)

## 2016-06-20 LAB — URINALYSIS, ROUTINE W REFLEX MICROSCOPIC
Bilirubin Urine: NEGATIVE
Hgb urine dipstick: NEGATIVE
KETONES UR: NEGATIVE
Nitrite: NEGATIVE
PH: 5.5 (ref 5.0–8.0)
TOTAL PROTEIN, URINE-UPE24: NEGATIVE
URINE GLUCOSE: NEGATIVE
UROBILINOGEN UA: 1 (ref 0.0–1.0)

## 2016-06-20 LAB — CBC WITH DIFFERENTIAL/PLATELET
BASOS ABS: 0 10*3/uL (ref 0.0–0.1)
Basophils Relative: 0.1 % (ref 0.0–3.0)
EOS ABS: 0 10*3/uL (ref 0.0–0.7)
Eosinophils Relative: 0.2 % (ref 0.0–5.0)
HEMATOCRIT: 32.5 % — AB (ref 36.0–46.0)
Hemoglobin: 11.1 g/dL — ABNORMAL LOW (ref 12.0–15.0)
LYMPHS PCT: 10.8 % — AB (ref 12.0–46.0)
Lymphs Abs: 0.9 10*3/uL (ref 0.7–4.0)
MCHC: 34.2 g/dL (ref 30.0–36.0)
MCV: 80.8 fl (ref 78.0–100.0)
MONOS PCT: 8.5 % (ref 3.0–12.0)
Monocytes Absolute: 0.7 10*3/uL (ref 0.1–1.0)
NEUTROS PCT: 80.4 % — AB (ref 43.0–77.0)
Neutro Abs: 6.5 10*3/uL (ref 1.4–7.7)
Platelets: 231 10*3/uL (ref 150.0–400.0)
RBC: 4.02 Mil/uL (ref 3.87–5.11)
RDW: 13.3 % (ref 11.5–15.5)
WBC: 8.1 10*3/uL (ref 4.0–10.5)

## 2016-06-20 LAB — SEDIMENTATION RATE: SED RATE: 47 mm/h — AB (ref 0–30)

## 2016-06-20 NOTE — Telephone Encounter (Signed)
Spoke w/dtr T 38C daily x 2 weeks, weak. Will get labs w/fever tomorrow To ER if worse No pain

## 2016-06-22 ENCOUNTER — Encounter (HOSPITAL_COMMUNITY): Payer: Self-pay

## 2016-06-22 ENCOUNTER — Inpatient Hospital Stay (HOSPITAL_COMMUNITY)
Admission: EM | Admit: 2016-06-22 | Discharge: 2016-06-28 | DRG: 289 | Disposition: A | Discharge status: Home Health | Payer: Self-pay | Attending: Internal Medicine | Admitting: Internal Medicine

## 2016-06-22 DIAGNOSIS — I110 Hypertensive heart disease with heart failure: Secondary | ICD-10-CM | POA: Diagnosis present

## 2016-06-22 DIAGNOSIS — N289 Disorder of kidney and ureter, unspecified: Secondary | ICD-10-CM | POA: Diagnosis present

## 2016-06-22 DIAGNOSIS — R7881 Bacteremia: Secondary | ICD-10-CM

## 2016-06-22 DIAGNOSIS — B954 Other streptococcus as the cause of diseases classified elsewhere: Secondary | ICD-10-CM | POA: Diagnosis present

## 2016-06-22 DIAGNOSIS — R791 Abnormal coagulation profile: Secondary | ICD-10-CM | POA: Diagnosis not present

## 2016-06-22 DIAGNOSIS — I2510 Atherosclerotic heart disease of native coronary artery without angina pectoris: Secondary | ICD-10-CM | POA: Diagnosis present

## 2016-06-22 DIAGNOSIS — Z7901 Long term (current) use of anticoagulants: Secondary | ICD-10-CM

## 2016-06-22 DIAGNOSIS — Z79899 Other long term (current) drug therapy: Secondary | ICD-10-CM

## 2016-06-22 DIAGNOSIS — I330 Acute and subacute infective endocarditis: Principal | ICD-10-CM | POA: Diagnosis present

## 2016-06-22 DIAGNOSIS — I4891 Unspecified atrial fibrillation: Secondary | ICD-10-CM

## 2016-06-22 DIAGNOSIS — I509 Heart failure, unspecified: Secondary | ICD-10-CM

## 2016-06-22 DIAGNOSIS — E785 Hyperlipidemia, unspecified: Secondary | ICD-10-CM | POA: Diagnosis present

## 2016-06-22 DIAGNOSIS — Z952 Presence of prosthetic heart valve: Secondary | ICD-10-CM

## 2016-06-22 DIAGNOSIS — Z1611 Resistance to penicillins: Secondary | ICD-10-CM | POA: Diagnosis present

## 2016-06-22 DIAGNOSIS — I482 Chronic atrial fibrillation: Secondary | ICD-10-CM | POA: Diagnosis present

## 2016-06-22 DIAGNOSIS — Z951 Presence of aortocoronary bypass graft: Secondary | ICD-10-CM

## 2016-06-22 DIAGNOSIS — I10 Essential (primary) hypertension: Secondary | ICD-10-CM | POA: Diagnosis present

## 2016-06-22 DIAGNOSIS — Z8673 Personal history of transient ischemic attack (TIA), and cerebral infarction without residual deficits: Secondary | ICD-10-CM

## 2016-06-22 DIAGNOSIS — L309 Dermatitis, unspecified: Secondary | ICD-10-CM | POA: Diagnosis present

## 2016-06-22 DIAGNOSIS — B955 Unspecified streptococcus as the cause of diseases classified elsewhere: Secondary | ICD-10-CM | POA: Diagnosis present

## 2016-06-22 DIAGNOSIS — M549 Dorsalgia, unspecified: Secondary | ICD-10-CM | POA: Diagnosis present

## 2016-06-22 HISTORY — DX: Unspecified atrial fibrillation: I48.91

## 2016-06-22 HISTORY — DX: Cerebral infarction, unspecified: I63.9

## 2016-06-22 HISTORY — DX: Atherosclerosis of coronary artery bypass graft(s) without angina pectoris: I25.810

## 2016-06-22 LAB — COMPREHENSIVE METABOLIC PANEL
ALT: 22 U/L (ref 14–54)
ANION GAP: 9 (ref 5–15)
AST: 29 U/L (ref 15–41)
Albumin: 3 g/dL — ABNORMAL LOW (ref 3.5–5.0)
Alkaline Phosphatase: 79 U/L (ref 38–126)
BUN: 21 mg/dL — ABNORMAL HIGH (ref 6–20)
CO2: 21 mmol/L — ABNORMAL LOW (ref 22–32)
Calcium: 9.3 mg/dL (ref 8.9–10.3)
Chloride: 106 mmol/L (ref 101–111)
Creatinine, Ser: 1.48 mg/dL — ABNORMAL HIGH (ref 0.44–1.00)
GFR calc Af Amer: 37 mL/min — ABNORMAL LOW (ref >60)
GFR, EST NON AFRICAN AMERICAN: 32 mL/min — AB (ref >60)
Glucose, Bld: 157 mg/dL — ABNORMAL HIGH (ref 65–99)
POTASSIUM: 4.3 mmol/L (ref 3.5–5.1)
Sodium: 136 mmol/L (ref 135–145)
Total Bilirubin: 0.5 mg/dL (ref 0.3–1.2)
Total Protein: 7.3 g/dL (ref 6.5–8.1)

## 2016-06-22 LAB — URINALYSIS, ROUTINE W REFLEX MICROSCOPIC
BILIRUBIN URINE: NEGATIVE
Glucose, UA: NEGATIVE mg/dL
Hgb urine dipstick: NEGATIVE
KETONES UR: NEGATIVE mg/dL
Leukocytes, UA: NEGATIVE
Nitrite: NEGATIVE
Protein, ur: NEGATIVE mg/dL
SPECIFIC GRAVITY, URINE: 1.005 (ref 1.005–1.030)
pH: 5 (ref 5.0–8.0)

## 2016-06-22 LAB — CBC WITH DIFFERENTIAL/PLATELET
Basophils Absolute: 0 10*3/uL (ref 0.0–0.1)
Basophils Relative: 0 %
EOS ABS: 0 10*3/uL (ref 0.0–0.7)
Eosinophils Relative: 0 %
HCT: 32.8 % — ABNORMAL LOW (ref 36.0–46.0)
HEMOGLOBIN: 11 g/dL — AB (ref 12.0–15.0)
Lymphocytes Relative: 10 %
Lymphs Abs: 1.1 10*3/uL (ref 0.7–4.0)
MCH: 27.2 pg (ref 26.0–34.0)
MCHC: 33.5 g/dL (ref 30.0–36.0)
MCV: 81 fL (ref 78.0–100.0)
MONOS PCT: 8 %
Monocytes Absolute: 0.9 10*3/uL (ref 0.1–1.0)
NEUTROS PCT: 82 %
Neutro Abs: 8.4 10*3/uL — ABNORMAL HIGH (ref 1.7–7.7)
Platelets: 233 10*3/uL (ref 150–400)
RBC: 4.05 MIL/uL (ref 3.87–5.11)
RDW: 14.1 % (ref 11.5–15.5)
WBC: 10.4 10*3/uL (ref 4.0–10.5)

## 2016-06-22 LAB — I-STAT CG4 LACTIC ACID, ED: Lactic Acid, Venous: 1.7 mmol/L (ref 0.5–1.9)

## 2016-06-22 LAB — PROTIME-INR
INR: 3.92
Prothrombin Time: 39.4 seconds — ABNORMAL HIGH (ref 11.4–15.2)

## 2016-06-22 MED ORDER — ACETAMINOPHEN 325 MG PO TABS
650.0000 mg | ORAL_TABLET | Freq: Four times a day (QID) | ORAL | Status: DC | PRN
  Administered 2016-06-26: 650 mg via ORAL
  Filled 2016-06-22: qty 2

## 2016-06-22 MED ORDER — ACETAMINOPHEN 650 MG RE SUPP: 650.0000 mg | Freq: Four times a day (QID) | RECTAL | Status: DC | PRN

## 2016-06-22 MED ORDER — ONDANSETRON HCL 4 MG PO TABS: 4.0000 mg | ORAL_TABLET | Freq: Four times a day (QID) | ORAL | Status: DC | PRN

## 2016-06-22 MED ORDER — VANCOMYCIN HCL 10 G IV SOLR
1250.0000 mg | Freq: Once | INTRAVENOUS | Status: AC
  Administered 2016-06-22: 1250 mg via INTRAVENOUS
  Filled 2016-06-22: qty 1250

## 2016-06-22 MED ORDER — ONDANSETRON HCL 4 MG/2ML IJ SOLN: 4.0000 mg | Freq: Four times a day (QID) | INTRAMUSCULAR | Status: DC | PRN

## 2016-06-22 MED ORDER — MAGNESIUM CITRATE PO SOLN: 1.0000 | Freq: Once | ORAL | Status: DC | PRN

## 2016-06-22 MED ORDER — TORSEMIDE 5 MG PO TABS
2.5000 mg | ORAL_TABLET | Freq: Every day | ORAL | Status: DC
  Filled 2016-06-22: qty 0.5

## 2016-06-22 MED ORDER — SODIUM CHLORIDE 0.9% FLUSH
3.0000 mL | Freq: Two times a day (BID) | INTRAVENOUS | Status: DC
  Administered 2016-06-22 – 2016-06-27 (×6): 3 mL via INTRAVENOUS

## 2016-06-22 MED ORDER — BISOPROLOL FUMARATE 5 MG PO TABS
2.5000 mg | ORAL_TABLET | Freq: Every day | ORAL | Status: DC
  Administered 2016-06-24 – 2016-06-26 (×3): 2.5 mg via ORAL
  Filled 2016-06-22 (×4): qty 0.5

## 2016-06-22 MED ORDER — SODIUM CHLORIDE 0.9 % IV BOLUS (SEPSIS): 1000.0000 mL | Freq: Once | INTRAVENOUS | Status: DC

## 2016-06-22 MED ORDER — BISACODYL 10 MG RE SUPP: 10.0000 mg | Freq: Every day | RECTAL | Status: DC | PRN

## 2016-06-22 MED ORDER — WARFARIN SODIUM 2.5 MG PO TABS: 2.5000 mg | ORAL_TABLET | Freq: Every day | ORAL | Status: DC

## 2016-06-22 MED ORDER — ATORVASTATIN CALCIUM 10 MG PO TABS
10.0000 mg | ORAL_TABLET | Freq: Every day | ORAL | Status: DC
  Filled 2016-06-22: qty 1

## 2016-06-22 MED ORDER — SODIUM CHLORIDE 0.9 % IV SOLN
INTRAVENOUS | Status: DC
  Administered 2016-06-22: 75 mL/h via INTRAVENOUS
  Administered 2016-06-24: 01:00:00 via INTRAVENOUS
  Administered 2016-06-24: 1000 mL via INTRAVENOUS

## 2016-06-22 MED ORDER — KETOROLAC TROMETHAMINE 15 MG/ML IJ SOLN: 15.0000 mg | Freq: Four times a day (QID) | INTRAMUSCULAR | Status: AC | PRN

## 2016-06-22 MED ORDER — RAMIPRIL 2.5 MG PO CAPS
5.0000 mg | ORAL_CAPSULE | Freq: Every day | ORAL | Status: DC
  Administered 2016-06-23 – 2016-06-28 (×6): 5 mg via ORAL
  Filled 2016-06-22: qty 1
  Filled 2016-06-22: qty 2
  Filled 2016-06-22: qty 1
  Filled 2016-06-22 (×2): qty 2
  Filled 2016-06-22 (×2): qty 1
  Filled 2016-06-22: qty 2

## 2016-06-22 MED ORDER — SENNOSIDES-DOCUSATE SODIUM 8.6-50 MG PO TABS
1.0000 | ORAL_TABLET | Freq: Every evening | ORAL | Status: DC | PRN
  Administered 2016-06-25: 1 via ORAL
  Filled 2016-06-22 (×2): qty 1

## 2016-06-22 MED ORDER — VANCOMYCIN HCL IN DEXTROSE 750-5 MG/150ML-% IV SOLN: 750.0000 mg | INTRAVENOUS | Status: DC

## 2016-06-22 MED ORDER — TRAZODONE HCL 50 MG PO TABS
25.0000 mg | ORAL_TABLET | Freq: Every evening | ORAL | Status: DC | PRN
  Administered 2016-06-25: 25 mg via ORAL
  Filled 2016-06-22: qty 1

## 2016-06-22 MED ORDER — HYDROCODONE-ACETAMINOPHEN 5-325 MG PO TABS: 1.0000 | ORAL_TABLET | ORAL | Status: DC | PRN

## 2016-06-22 NOTE — Telephone Encounter (Signed)
2 wks of fever 38.5C and chills and sweats w/weakness twice a day Blood Cx x1 w/G+ cocci in chains Called dtr - instructed to go to [facility] ER this am Thx

## 2016-06-22 NOTE — ED Notes (Signed)
Pt ambulatory to restroom; pt attempted to produce a urine specimen, but is unable to provide one at this time

## 2016-06-22 NOTE — ED Provider Notes (Signed)
[facility] DEPT Provider Note   CSN: [id number] Arrival date & time: 06/22/16  1207     History   Chief Complaint Chief Complaint  Patient presents with  . + blood cul, chills    HPI Alexis [name] is a 83 y.o. female.  HPI  Presents with concern for generalized weakness and fevers over the last 2 weeks. The daughter reports that the fevers began around January 8, and have been around 38C. Reports that her mom has had significant generalized weakness. She has occasional cough, occasional runny nose, however nothing significant. Denies any dental pain, rash, urinary symptoms or vaginal discharge. She was evaluated by her primary care physician for these concerns who ordered blood cultures which one out of one blood culture returned showing gram-positive cocci in chains and she was sent to the emergency department  History reviewed. No pertinent past medical history.  Patient Active Problem List   Diagnosis Date Noted  . Positive blood cultures 06/22/2016  . Shoulder blade pain 06/06/2016  . Eczema 06/06/2016  . Chest wall pain 06/06/2016  . Gastroenteritis 06/20/2014  . Orthostatic hypotension 06/20/2014  . Hyponatremia 06/20/2014  . Memory loss 08/16/2013  . Encounter for therapeutic drug monitoring 07/06/2013  . Long term current use of anticoagulant therapy 06/17/2012  . Encounter for monitoring coumadin therapy 03/30/2012  . Essential hypertension 05/31/2008  . Coronary atherosclerosis 05/31/2008  . Congestive heart failure ([place]) 05/31/2008  . CONSTIPATION 05/31/2008  . MYALGIA 05/31/2008    History reviewed. No pertinent surgical history.  OB History    No data available       Home Medications    Prior to Admission medications   Medication Sig Start Date End Date Taking? Authorizing Provider  acetaminophen (TYLENOL) 500 MG tablet Take 500-1,000 mg by mouth 2 (two) times daily as needed for fever.    Yes Historical Provider, MD  atorvastatin  (LIPITOR) 10 MG tablet Take 1 tablet (10 mg total) by mouth daily. 09/07/14  Yes [name], MD  bisoprolol (ZEBETA) 5 MG tablet Take 0.5 tablets (2.5 mg total) by mouth daily. Patient taking differently: Take 1.25 mg by mouth daily.  08/29/14  Yes [name], MD  Cholecalciferol (VITAMIN D3) 2000 units capsule Take 1 capsule (2,000 Units total) by mouth daily. 06/06/16  Yes [name], MD  ramipril (ALTACE) 2.5 MG capsule Take 2.5 mg by mouth daily.   Yes Historical Provider, MD  torsemide (DEMADEX) 5 MG tablet Take 0.5 tablets (2.5 mg total) by mouth daily. 06/06/16  Yes [name], MD  UNABLE TO FIND [place] (from [place])/Choline alphoscerate: Take 400 mg by mouth two times a day   Yes Historical Provider, MD  warfarin (COUMADIN) 2.5 MG tablet Take as directed by anticoagulation clinic Patient taking differently: Take 1.25-2.5 mg by mouth See admin instructions. 1.25 mg in the evening on Sun/Tues/Thurs/Sat and 2.5 mg on Mon/Wed/Fri 09/07/14  Yes [name], MD  loratadine (CLARITIN) 10 MG tablet Take 1 tablet (10 mg total) by mouth daily. Patient not taking: Reported on 06/22/2016 06/06/16 06/06/17  [name], MD  ramipril (ALTACE) 5 MG capsule Take 1 capsule (5 mg total) by mouth daily. Patient not taking: Reported on 06/22/2016 04/10/12   [name] V, MD  triamcinolone ointment (KENALOG) 0.5 % Apply 1 application topically 2 (two) times daily. Patient not taking: Reported on 06/22/2016 06/06/16 06/06/17  [name], MD    Family History No family history on file.  Social  History Social History  Substance Use Topics  . Smoking status: Never Smoker  . Smokeless tobacco: Not on file  . Alcohol use Not on file     Allergies   Hydralazine hcl   Review of Systems Review of Systems  Constitutional: Positive for fatigue and fever.  HENT: Positive for congestion (occasional). Negative for sore throat.   Eyes: Negative for  visual disturbance.  Respiratory: Positive for cough (occasional). Negative for shortness of breath.   Cardiovascular: Negative for chest pain.  Gastrointestinal: Negative for abdominal pain, diarrhea, nausea and vomiting.  Genitourinary: Negative for difficulty urinating, dysuria and vaginal discharge.  Musculoskeletal: Negative for back pain and neck pain.  Skin: Negative for rash.  Neurological: Negative for syncope and headaches.     Physical Exam Updated Vital Signs BP 140/78   Pulse 70   Temp 97.4 F (36.3 C) (Oral)   Resp 17   Ht 4' 11.06" (1.5 m)   Wt 160 lb 0.9 oz (72.6 kg)   SpO2 100%   BMI 32.27 kg/m   Physical Exam  Constitutional: She is oriented to person, place, and time. She appears well-developed and well-nourished. No distress.  HENT:  Head: Normocephalic and atraumatic.  Eyes: Conjunctivae and EOM are normal.  Neck: Normal range of motion.  Cardiovascular: Normal rate, regular rhythm, normal heart sounds and intact distal pulses.  Exam reveals no gallop and no friction rub.   No murmur heard. Pulmonary/Chest: Effort normal and breath sounds normal. No respiratory distress. She has no wheezes. She has no rales.  Abdominal: Soft. She exhibits no distension. There is no tenderness. There is no guarding.  Musculoskeletal: She exhibits no edema or tenderness.  Neurological: She is alert and oriented to person, place, and time.  Skin: Skin is warm and dry. No rash noted. She is not diaphoretic. No erythema.  Nursing note and vitals reviewed.    ED Treatments / Results  Labs (all labs ordered are listed, but only abnormal results are displayed) Labs Reviewed  COMPREHENSIVE METABOLIC PANEL - Abnormal; Notable for the following:       Result Value   CO2 21 (*)    Glucose, Bld 157 (*)    BUN 21 (*)    Creatinine, Ser 1.48 (*)    Albumin 3.0 (*)    GFR calc non Af Amer 32 (*)    GFR calc Af Amer 37 (*)    All other components within normal limits  CBC  WITH DIFFERENTIAL/PLATELET - Abnormal; Notable for the following:    Hemoglobin 11.0 (*)    HCT 32.8 (*)    Neutro Abs 8.4 (*)    All other components within normal limits  PROTIME-INR - Abnormal; Notable for the following:    Prothrombin Time 39.4 (*)    All other components within normal limits  CULTURE, BLOOD (ROUTINE X 2)  CULTURE, BLOOD (ROUTINE X 2)  URINE CULTURE  URINALYSIS, ROUTINE W REFLEX MICROSCOPIC  INFLUENZA PANEL BY PCR (TYPE A & B)  COMPREHENSIVE METABOLIC PANEL  CBC  I-STAT CG4 LACTIC ACID, ED    EKG  EKG Interpretation None       Radiology No results found.  Procedures Procedures (including critical care time)  Medications Ordered in ED Medications  vancomycin (VANCOCIN) IVPB 750 mg/150 ml premix (not administered)  sodium chloride 0.9 % bolus 1,000 mL (not administered)  torsemide (DEMADEX) tablet 2.5 mg (not administered)  atorvastatin (LIPITOR) tablet 10 mg (not administered)  warfarin (COUMADIN) tablet 2.5 mg (  not administered)  bisoprolol (ZEBETA) tablet 2.5 mg (not administered)  ramipril (ALTACE) capsule 5 mg (not administered)  0.9 %  sodium chloride infusion (not administered)  acetaminophen (TYLENOL) tablet 650 mg (not administered)    Or  acetaminophen (TYLENOL) suppository 650 mg (not administered)  HYDROcodone-acetaminophen (NORCO/VICODIN) 5-325 MG per tablet 1-2 tablet (not administered)  ketorolac (TORADOL) 15 MG/ML injection 15 mg (not administered)  traZODone (DESYREL) tablet 25 mg (not administered)  senna-docusate (Senokot-S) tablet 1 tablet (not administered)  bisacodyl (DULCOLAX) suppository 10 mg (not administered)  magnesium citrate solution 1 Bottle (not administered)  ondansetron (ZOFRAN) tablet 4 mg (not administered)    Or  ondansetron (ZOFRAN) injection 4 mg (not administered)  sodium chloride flush (NS) 0.9 % injection 3 mL (not administered)  vancomycin (VANCOCIN) 1,250 mg in sodium chloride 0.9 % 250 mL IVPB (1,250  mg Intravenous New Bag/Given 06/22/16 1338)     Initial Impression / Assessment and Plan / ED Course  I have reviewed the triage vital signs and the nursing notes.  Pertinent labs & imaging results that were available during my care of the patient were reviewed by me and considered in my medical decision making (see chart for details).    83 year old female with a history of hypertension, hyperlipidemia, coronary artery disease, CVA, atrial fibrillation on Coumadin, presents with concern of 2 weeks of fever, generalized weakness, positive blood cultures discovered at primary care physician's office, with one out of one culture showing gram-positive cocci in chains.  Patient without clear source of infection on history or exam. CXR pending. Urinalysis negative yesterday but reordered and urine and cx pending. No sign of sepsis.  Pt given 1L of NS, vancomycin. Will admit for continued care.     Final Clinical Impressions(s) / ED Diagnoses   Final diagnoses:  Positive blood culture    New Prescriptions Current Discharge Medication List       Gareth Morgan, MD 06/22/16 [phone]

## 2016-06-22 NOTE — ED Notes (Signed)
Admitting at bedside 

## 2016-06-22 NOTE — Progress Notes (Signed)
ANTICOAGULATION CONSULT NOTE - Initial Consult  Pharmacy Consult for coumadin  Indication: atrial fibrillation  Allergies  Allergen Reactions  . Hydralazine Hcl Other (See Comments)    Headache     Patient Measurements: Height: 4' 11.06" (150 cm) Weight: 160 lb 0.9 oz (72.6 kg) IBW/kg (Calculated) : 43.33   Vital Signs: Temp: 97.4 F (36.3 C) (01/27 1218) Temp Source: Oral (01/27 1218) BP: 140/78 (01/27 1530) Pulse Rate: 70 (01/27 1515)  Labs:  Recent Labs  06/20/16 1637 06/22/16 1221 06/22/16 1614  HGB 11.1* 11.0*  --   HCT 32.5* 32.8*  --   PLT 231.0 233  --   LABPROT  --   --  39.4*  INR  --   --  3.92  CREATININE  --  1.48*  --     Estimated Creatinine Clearance: 26.3 mL/min (by C-G formula based on SCr of 1.48 mg/dL (H)).   Medical History: History reviewed. No pertinent past medical history.   Assessment: 82 YOF on coumadin PTA for afib, admitted with fever, chills, and positive blood culture. Pharmacy is consulted to manage coumadin dosing. INR 3.92 on admission.   Goal of Therapy:  INR 2-3 Monitor platelets by anticoagulation protocol: Yes   Plan:  Hold coumadin tonight Daily INR  Maryanna Shape, PharmD, BCPS  Clinical Pharmacist  Pager: [phone]  06/22/2016,5:28 PM

## 2016-06-22 NOTE — H&P (Signed)
History and Physical    Alexis [name] LZJ:[id number] DOB: [birth year] DOA: 06/22/2016   PCP: [name], MD   Patient coming from:  Home   Chief Complaint: Generalized weakness   HPI: Alexis [name] is a 83 y.o. female with a history of HTN, HlD, CAD s/p CABG 2004, CVA in the early 200s, Atrial fibrillation on Coumadin, possible cognitive deficiency  from [place], visiting her daughter from October  till April of each year, presenting to the ED as directed by her PCP, as she was noted to grow G + Cocci in chains (one of the 2 bottles) on 1/25. At the time, she had presented with complaints of fevers up to 100.3, chills, but no sweats. Information is obtained from her daughter . She is not aware of sick contacts but had been seen at the PCP due to intermittent fevers up to100.3. She denies any recent long distance trips.  Denies any shortness of breath, chest pain or palpitations. She denies any nausea or vomiting. No diarrhea. No insect bites. She denies any abdominal pain. She had decreased urine output as she had not been drinking enough fluids. No confusion. No syncope or presyncope. No falls.    ED Course:  BP 140/78   Pulse 70   Temp 97.4 F (36.3 C) (Oral)   Resp 17   Ht 4' 11.06" (1.5 m)   Wt 72.6 kg (160 lb 0.9 oz)   SpO2 100%   BMI 32.27 kg/m    hemoglobin 11 white count 10.4  creatinine 1.48 glucose 157 sodium 136 potassium 4.3 lactic acid 1.7 UA + leukocytes, neg for nitrites Recent blood culture drew G + cocci in chains (1/25)  Vancomycin IV given in the ED   Review of Systems: As per HPI otherwise 10 point review of systems negative.   History reviewed. No pertinent past medical history.  History reviewed. No pertinent surgical history.  Social History Social History   Social History  . Marital status: Married    Spouse name: N/A  . Number of children: N/A  . Years of education: N/A   Occupational History  . Not on file.   Social History  Main Topics  . Smoking status: Never Smoker  . Smokeless tobacco: Not on file  . Alcohol use Not on file  . Drug use: Unknown  . Sexual activity: Not on file   Other Topics Concern  . Not on file   Social History Narrative  . No narrative on file     Allergies  Allergen Reactions  . Hydralazine Hcl Other (See Comments)    Headache     No family history on file.    Prior to Admission medications   Medication Sig Start Date End Date Taking? Authorizing Provider  atorvastatin (LIPITOR) 10 MG tablet Take 1 tablet (10 mg total) by mouth daily. 09/07/14   [name], MD  bisoprolol (ZEBETA) 5 MG tablet Take 0.5 tablets (2.5 mg total) by mouth daily. 08/29/14   [name], MD  Cholecalciferol (VITAMIN D3) 2000 units capsule Take 1 capsule (2,000 Units total) by mouth daily. 06/06/16   [name], MD  loratadine (CLARITIN) 10 MG tablet Take 1 tablet (10 mg total) by mouth daily. 06/06/16 06/06/17  [name], MD  ramipril (ALTACE) 5 MG capsule Take 1 capsule (5 mg total) by mouth daily. 04/10/12   [name], MD  torsemide (DEMADEX) 5 MG tablet Take 0.5 tablets (2.5 mg total) by mouth daily.  06/06/16   [name], MD  triamcinolone ointment (KENALOG) 0.5 % Apply 1 application topically 2 (two) times daily. 06/06/16 06/06/17  [name], MD  warfarin (COUMADIN) 2.5 MG tablet Take as directed by anticoagulation clinic 09/07/14   [name], MD    Physical Exam:  Vitals:   06/22/16 1430 06/22/16 1500 06/22/16 1515 06/22/16 1530  BP: 129/56 138/99  140/78  Pulse: 66 72 70   Resp: 17     Temp:      TempSrc:      SpO2: 100% 98% 100%   Weight:      Height:       Constitutional: NAD, calm, comfortable, anxious appearing   Eyes: PERRL, lids and conjunctivae normal ENMT: Mucous membranes are moist, without exudate or lesions  Neck: normal, supple, no masses, no thyromegaly Respiratory: clear to auscultation bilaterally,  no wheezing, no crackles. Normal respiratory effort  Cardiovascular: regular  rate and rhythm, 1/6 systolic  murmurs / rubs / gallops. No extremity edema. 2+ pedal pulses. No carotid bruits.  Abdomen: Soft, non tender, No hepatosplenomegaly. Bowel sounds positive.  Musculoskeletal: no clubbing / cyanosis. Moves all extremities Skin: no jaundice, No lesions.  Neurologic: Sensation intact  Strength equal extremities  Psychiatric:   Alert and oriented x 3.anxious mood.     Labs on Admission: I have personally reviewed following labs and imaging studies  CBC:  Recent Labs Lab 06/20/16 1637 06/22/16 1221  WBC 8.1 10.4  NEUTROABS 6.5 8.4*  HGB 11.1* 11.0*  HCT 32.5* 32.8*  MCV 80.8 81.0  PLT 231.0 967    Basic Metabolic Panel:  Recent Labs Lab 06/22/16 1221  NA 136  K 4.3  CL 106  CO2 21*  GLUCOSE 157*  BUN 21*  CREATININE 1.48*  CALCIUM 9.3    GFR: Estimated Creatinine Clearance: 26.3 mL/min (by C-G formula based on SCr of 1.48 mg/dL (H)).  Liver Function Tests:  Recent Labs Lab 06/20/16 1637 06/22/16 1221  AST 25 29  ALT 24 22  ALKPHOS 77 79  BILITOT 0.7 0.5  PROT 7.5 7.3  ALBUMIN 3.4* 3.0*   No results for input(s): LIPASE, AMYLASE in the last 168 hours. No results for input(s): AMMONIA in the last 168 hours.  Coagulation Profile: No results for input(s): INR, PROTIME in the last 168 hours.  Cardiac Enzymes: No results for input(s): CKTOTAL, CKMB, CKMBINDEX, TROPONINI in the last 168 hours.  BNP (last 3 results) No results for input(s): PROBNP in the last 8760 hours.  HbA1C: No results for input(s): HGBA1C in the last 72 hours.  CBG: No results for input(s): GLUCAP in the last 168 hours.  Lipid Profile: No results for input(s): CHOL, HDL, LDLCALC, TRIG, CHOLHDL, LDLDIRECT in the last 72 hours.  Thyroid Function Tests: No results for input(s): TSH, T4TOTAL, FREET4, T3FREE, THYROIDAB in the last 72 hours.  Anemia Panel: No results for  input(s): VITAMINB12, FOLATE, FERRITIN, TIBC, IRON, RETICCTPCT in the last 72 hours.  Urine analysis:    Component Value Date/Time   COLORURINE YELLOW 06/20/2016 1637   APPEARANCEUR CLEAR 06/20/2016 1637   LABSPEC <=1.005 (A) 06/20/2016 1637   PHURINE 5.5 06/20/2016 1637   GLUCOSEU NEGATIVE 06/20/2016 1637   HGBUR NEGATIVE 06/20/2016 1637   BILIRUBINUR NEGATIVE 06/20/2016 1637   KETONESUR NEGATIVE 06/20/2016 1637   UROBILINOGEN 1.0 06/20/2016 1637   NITRITE NEGATIVE 06/20/2016 1637   LEUKOCYTESUR SMALL (A) 06/20/2016 1637    Sepsis Labs: @LABRCNTIP (procalcitonin:4,lacticidven:4) ) Recent Results (from the  past 240 hour(s))  Blood culture (routine single)     Status: None (Preliminary result)   Collection Time: 06/20/16  4:37 PM  Result Value Ref Range Status   Preliminary Report GRAM POSITIVE COCCI IN CHAINS  Preliminary     Radiological Exams on Admission: No results found.  EKG: Independently reviewed.  Assessment/Plan Active Problems:   Positive blood cultures   Essential hypertension   Coronary atherosclerosis   Congestive heart failure ([place])   Long term current use of anticoagulant therapy   Eczema    Generalized weakness in the setting of possible infection. Recent + culture as OP on 1/25 grew  G + cocci in chains in 1 of the 2 bottles. . UA + leukocytes, neg for nitrites. CXR pending.  white count 10.4. lactic acid 1.7. Flu panel pending  No confusion. She was febrile as OP T max 100, with chills and cough. Non septic appearing.  Received Vanco at the ED.  Admit to medsurg obs  Continue IV Vanco for now.  IVF  Await for blood and Urine cultures  Influenza panel   Hypertension BP 125/87   Pulse 75    Controlled Continue home anti-hypertensive medications  Add Hydralazine Q6 hours as needed for BP 160/90   Hyperlipidemia Continue home statins  Atrial Fibrillation CHA2DS2-VASc score 6 , on anticoagulation with Coumadin    Rate controlled Continue  meds    DVT prophylaxis: Coumadin  Code Status:   Full    Family Communication:  Discussed with patient and daughter Les Pou is her interpreter  Disposition Plan: Expect patient to be discharged to home after condition improves Consults called:    None Admission status:  Obs  Medsurg     Rondel Jumbo, PA-C [facility]   06/22/2016, 3:42 PM

## 2016-06-22 NOTE — Progress Notes (Signed)
Pharmacy Antibiotic Note  Finleigh Cheong is a 83 y.o. female admitted on 06/22/2016 with chills, fever and positive blood culture.  Pharmacy has been consulted for vancomycin dosing.  Baseline labs reviewed.   Plan: - Vanc 1250mg  IV x 1, then 750mg  IV Q24H - Monitor renal fxn, micro data, vanc trough as indicated   Height: 4' 11.06" (150 cm) Weight: 160 lb 0.9 oz (72.6 kg) IBW/kg (Calculated) : 43.33  Temp (24hrs), Avg:97.4 F (36.3 C), Min:97.4 F (36.3 C), Max:97.4 F (36.3 C)   Recent Labs Lab 06/20/16 1637 06/22/16 1221 06/22/16 1247  WBC 8.1 10.4  --   CREATININE  --  1.48*  --   LATICACIDVEN  --   --  1.70    Estimated Creatinine Clearance: 26.3 mL/min (by C-G formula based on SCr of 1.48 mg/dL (H)).    Allergies  Allergen Reactions  . Hydralazine Hcl     REACTION: headache     Vanc 1/27 >>  1/25 BCx x1 - GPC 1/27 BCx x2 -   Sokha Craker D. Mina Marble, PharmD, BCPS Pager:  [phone] 06/22/2016, 1:40 PM

## 2016-06-22 NOTE — ED Triage Notes (Addendum)
Patient sent by [facility] primary for further evaluation of chills, fever and positive blood cultures. On arrival no pain and no fever. Through interpretor reports that she had chills earlier in week. Complains of ongoing weakness with cough.

## 2016-06-23 DIAGNOSIS — R7881 Bacteremia: Secondary | ICD-10-CM

## 2016-06-23 DIAGNOSIS — B955 Unspecified streptococcus as the cause of diseases classified elsewhere: Secondary | ICD-10-CM | POA: Diagnosis present

## 2016-06-23 DIAGNOSIS — I4891 Unspecified atrial fibrillation: Secondary | ICD-10-CM

## 2016-06-23 LAB — COMPREHENSIVE METABOLIC PANEL
ALK PHOS: 72 U/L (ref 38–126)
ALT: 20 U/L (ref 14–54)
AST: 26 U/L (ref 15–41)
Albumin: 2.7 g/dL — ABNORMAL LOW (ref 3.5–5.0)
Anion gap: 8 (ref 5–15)
BUN: 15 mg/dL (ref 6–20)
CALCIUM: 8.8 mg/dL — AB (ref 8.9–10.3)
CO2: 20 mmol/L — ABNORMAL LOW (ref 22–32)
CREATININE: 1.15 mg/dL — AB (ref 0.44–1.00)
Chloride: 107 mmol/L (ref 101–111)
GFR calc Af Amer: 51 mL/min — ABNORMAL LOW (ref >60)
GFR, EST NON AFRICAN AMERICAN: 44 mL/min — AB (ref >60)
GLUCOSE: 108 mg/dL — AB (ref 65–99)
POTASSIUM: 4.1 mmol/L (ref 3.5–5.1)
Sodium: 135 mmol/L (ref 135–145)
TOTAL PROTEIN: 6.8 g/dL (ref 6.5–8.1)
Total Bilirubin: 0.7 mg/dL (ref 0.3–1.2)

## 2016-06-23 LAB — BLOOD CULTURE ID PANEL (REFLEXED)
Acinetobacter baumannii: NOT DETECTED
Candida albicans: NOT DETECTED
Candida glabrata: NOT DETECTED
Candida krusei: NOT DETECTED
Candida parapsilosis: NOT DETECTED
Candida tropicalis: NOT DETECTED
ENTEROCOCCUS SPECIES: NOT DETECTED
Enterobacter cloacae complex: NOT DETECTED
Enterobacteriaceae species: NOT DETECTED
Escherichia coli: NOT DETECTED
HAEMOPHILUS INFLUENZAE: NOT DETECTED
Klebsiella oxytoca: NOT DETECTED
Klebsiella pneumoniae: NOT DETECTED
LISTERIA MONOCYTOGENES: NOT DETECTED
NEISSERIA MENINGITIDIS: NOT DETECTED
PROTEUS SPECIES: NOT DETECTED
PSEUDOMONAS AERUGINOSA: NOT DETECTED
SERRATIA MARCESCENS: NOT DETECTED
STAPHYLOCOCCUS AUREUS BCID: NOT DETECTED
STAPHYLOCOCCUS SPECIES: NOT DETECTED
STREPTOCOCCUS PNEUMONIAE: NOT DETECTED
STREPTOCOCCUS PYOGENES: NOT DETECTED
STREPTOCOCCUS SPECIES: DETECTED — AB
Streptococcus agalactiae: NOT DETECTED

## 2016-06-23 LAB — INFLUENZA PANEL BY PCR (TYPE A & B)
INFLAPCR: NEGATIVE
INFLBPCR: NEGATIVE

## 2016-06-23 LAB — CBC
HEMATOCRIT: 29.9 % — AB (ref 36.0–46.0)
Hemoglobin: 10.2 g/dL — ABNORMAL LOW (ref 12.0–15.0)
MCH: 27.3 pg (ref 26.0–34.0)
MCHC: 34.1 g/dL (ref 30.0–36.0)
MCV: 80.2 fL (ref 78.0–100.0)
PLATELETS: 206 10*3/uL (ref 150–400)
RBC: 3.73 MIL/uL — ABNORMAL LOW (ref 3.87–5.11)
RDW: 14.2 % (ref 11.5–15.5)
WBC: 7.7 10*3/uL (ref 4.0–10.5)

## 2016-06-23 LAB — PROTIME-INR
INR: 3.32
PROTHROMBIN TIME: 34.5 s — AB (ref 11.4–15.2)

## 2016-06-23 LAB — CULTURE, BLOOD (SINGLE)

## 2016-06-23 MED ORDER — ATORVASTATIN CALCIUM 10 MG PO TABS
10.0000 mg | ORAL_TABLET | Freq: Every day | ORAL | Status: DC
  Administered 2016-06-23 – 2016-06-27 (×5): 10 mg via ORAL
  Filled 2016-06-23 (×5): qty 1

## 2016-06-23 MED ORDER — DEXTROSE 5 % IV SOLN
2.0000 g | INTRAVENOUS | Status: DC
  Administered 2016-06-24 – 2016-06-28 (×5): 2 g via INTRAVENOUS
  Filled 2016-06-23 (×7): qty 2

## 2016-06-23 MED ORDER — FUROSEMIDE 10 MG/ML PO SOLN
5.0000 mg | Freq: Every day | ORAL | Status: DC
  Administered 2016-06-24 – 2016-06-28 (×5): 5 mg via ORAL
  Filled 2016-06-23 (×7): qty 0.5

## 2016-06-23 NOTE — Progress Notes (Signed)
PHARMACY - PHYSICIAN COMMUNICATION CRITICAL VALUE ALERT - BLOOD CULTURE IDENTIFICATION (BCID)  Results for orders placed or performed during the hospital encounter of 06/22/16  Blood Culture ID Panel (Reflexed) (Collected: 06/22/2016  1:22 PM)  Result Value Ref Range   Enterococcus species NOT DETECTED NOT DETECTED   Listeria monocytogenes NOT DETECTED NOT DETECTED   Staphylococcus species NOT DETECTED NOT DETECTED   Staphylococcus aureus NOT DETECTED NOT DETECTED   Streptococcus species DETECTED (A) NOT DETECTED   Streptococcus agalactiae NOT DETECTED NOT DETECTED   Streptococcus pneumoniae NOT DETECTED NOT DETECTED   Streptococcus pyogenes NOT DETECTED NOT DETECTED   Acinetobacter baumannii NOT DETECTED NOT DETECTED   Enterobacteriaceae species NOT DETECTED NOT DETECTED   Enterobacter cloacae complex NOT DETECTED NOT DETECTED   Escherichia coli NOT DETECTED NOT DETECTED   Klebsiella oxytoca NOT DETECTED NOT DETECTED   Klebsiella pneumoniae NOT DETECTED NOT DETECTED   Proteus species NOT DETECTED NOT DETECTED   Serratia marcescens NOT DETECTED NOT DETECTED   Haemophilus influenzae NOT DETECTED NOT DETECTED   Neisseria meningitidis NOT DETECTED NOT DETECTED   Pseudomonas aeruginosa NOT DETECTED NOT DETECTED   Candida albicans NOT DETECTED NOT DETECTED   Candida glabrata NOT DETECTED NOT DETECTED   Candida krusei NOT DETECTED NOT DETECTED   Candida parapsilosis NOT DETECTED NOT DETECTED   Candida tropicalis NOT DETECTED NOT DETECTED    Name of physician (or Provider) Contacted: A. Hugelmeyer  Changes to prescribed antibiotics required: suggest de-escalating to ceftriaxone 2g IV q24h (currently on vancomycin), however vancomycin still covers organism.   Alexis [name] 06/23/2016  5:32 AM

## 2016-06-23 NOTE — Progress Notes (Signed)
ANTICOAGULATION CONSULT NOTE - Initial Consult  Pharmacy Consult for coumadin  Indication: atrial fibrillation  Allergies  Allergen Reactions  . Hydralazine Hcl Other (See Comments)    Headache     Patient Measurements: Height: 4' 11.06" (150 cm) Weight: 159 lb 9.8 oz (72.4 kg) IBW/kg (Calculated) : 43.33   Vital Signs: Temp: 98.7 F (37.1 C) (01/28 0521) Temp Source: Oral (01/28 0521) BP: 132/70 (01/28 0521) Pulse Rate: 102 (01/28 0521)  Labs:  Recent Labs  06/20/16 1637 06/22/16 1221 06/22/16 1614 06/23/16 0434  HGB 11.1* 11.0*  --  10.2*  HCT 32.5* 32.8*  --  29.9*  PLT 231.0 233  --  206  LABPROT  --   --  39.4* 34.5*  INR  --   --  3.92 3.32  CREATININE  --  1.48*  --  1.15*    Estimated Creatinine Clearance: 33.8 mL/min (by C-G formula based on SCr of 1.15 mg/dL (H)).   Medical History: History reviewed. No pertinent past medical history.   Assessment: 82 YOF on coumadin PTA for afib, admitted with fever, chills, and positive blood culture. Pharmacy is consulted to manage coumadin dosing. INR 3.32 today.  PTA dose: 1.25 mg on TTSS, 2.5 mg on MWF  Goal of Therapy:  INR 2-3 Monitor platelets by anticoagulation protocol: Yes   Plan:  Hold coumadin tonight Daily INR  Maryanna Shape, PharmD, BCPS  Clinical Pharmacist  Pager: [phone]  06/23/2016,1:07 PM

## 2016-06-23 NOTE — Consult Note (Signed)
[facility]  Total days of antibiotics 2        Day 1 ceftriaxone         Reason for Consult: strep bacteremia   Referring Physician: Grandville Silos  Principal Problem:   Bacteremia due to Streptococcus Active Problems:   Essential hypertension   Coronary atherosclerosis   Congestive heart failure ([place])   Long term current use of anticoagulant therapy   Eczema   Positive blood cultures    HPI: Alexis [name] is a 83 y.o. female [place] speaking female who stays with her daughter 6 months of the year. Past med hx includes CAD, AFIB on coumadin, CVA who reports having fevers and malaise since jan 12th. She takes her temp and vitals regularly and noticed her temp consistently greater than 38C since the 12th. Just at onset of fever, she also noticed some back pain/pain radiating beneath her scapula that has now subsided. She had labs drawn on 1/25 by her pcp given her symptoms, which grew strep viridans, and instructed to come to the ED for evaluation. Her repeat blood cx on admit to the hospital as 3/4 bottles + GPC in chains, strep species by BCID on day 1 of growth. Her daughter reports that her mother started to feel poorly roughly 3 wks ago with weakness, occasional cough, back/shoulder pain.  She initially went to [facility] when symptoms started of shoulder/back pain and malaise and was told that she had MSK pain. Here at this admit Flu screen is negative. WBC of 10K with 82N, UA not suggestive of UTI, blood cx + on 1/27 (and on ambulatory 1/25 x 1 set with strep viridans group - intermed PCN)  She was initially started on vancomycin and now changed to ceftriaxone  History reviewed. No pertinent past medical history.  Allergies:  Allergies  Allergen Reactions  . Hydralazine Hcl Other (See Comments)    Headache    MEDICATIONS: . atorvastatin  10 mg Oral Daily  . bisoprolol  2.5 mg Oral Daily  . cefTRIAXone (ROCEPHIN)  IV  2 g Intravenous Q24H  .  furosemide  5 mg Oral Daily  . ramipril  5 mg Oral Daily  . sodium chloride  1,000 mL Intravenous Once  . sodium chloride flush  3 mL Intravenous Q12H    Social History  Substance Use Topics  . Smoking status: Never Smoker  . Smokeless tobacco: Not on file  . Alcohol use Not on file    No family history on file.   Review of Systems  Constitutional: positive for fever, chills, diaphoresis, activity change, appetite change, fatigue and unexpected weight change.  HENT: Negative for congestion, sore throat, rhinorrhea, sneezing, trouble swallowing and sinus pressure.  Eyes: Negative for photophobia and visual disturbance.  Respiratory: Negative for cough, chest tightness, shortness of breath, wheezing and stridor.  Cardiovascular: Negative for chest pain, palpitations and leg swelling.  Gastrointestinal: Negative for nausea, vomiting, abdominal pain, diarrhea, constipation, blood in stool, abdominal distention and anal bleeding.  Genitourinary: Negative for dysuria, hematuria, flank pain and difficulty urinating.  Musculoskeletal: Negative for myalgias, back pain, joint swelling, arthralgias and gait problem.  Skin: Negative for color change, pallor, rash and wound.  Neurological: Negative for dizziness, tremors, weakness and light-headedness.  Hematological: Negative for adenopathy. Does not bruise/bleed easily.  Psychiatric/Behavioral: Negative for behavioral problems, confusion, sleep disturbance, dysphoric mood, decreased concentration and agitation.     OBJECTIVE: Temp:  [98.3 F (36.8 C)-98.7 F (37.1 C)] 98.7 F (  37.1 C) (01/28 0521) Pulse Rate:  [66-102] 102 (01/28 0521) Resp:  [17-19] 19 (01/28 0521) BP: (129-141)/(56-99) 132/70 (01/28 0521) SpO2:  [95 %-100 %] 95 % (01/28 0521) Weight:  [159 lb 9.8 oz (72.4 kg)] 159 lb 9.8 oz (72.4 kg) (01/28 0314) Physical Exam  Constitutional:  oriented to person, place, and time. appears well-developed and well-nourished. No  distress.  HENT: [place]/AT, PERRLA, no scleral icterus, edentulous Mouth/Throat: Oropharynx is clear and moist. No oropharyngeal exudate.  Cardiovascular: Normal rate, regular rhythm and normal heart sounds. Exam reveals no gallop and no friction rub.  No murmur heard.  Pulmonary/Chest: Effort normal and breath sounds normal. No respiratory distress.  has no wheezes.  Neck = supple, no nuchal rigidity Abdominal: Soft. Bowel sounds are normal.  exhibits no distension. There is no tenderness.  Lymphadenopathy: no cervical adenopathy. No axillary adenopathy Neurological: alert and oriented to person, place, and time.  Skin: Skin is warm and dry. No rash noted. No erythema.  Psychiatric: a normal mood and affect.  behavior is normal.    LABS: Results for orders placed or performed during the hospital encounter of 06/22/16 (from the past 48 hour(s))  Comprehensive metabolic panel     Status: Abnormal   Collection Time: 06/22/16 12:21 PM  Result Value Ref Range   Sodium 136 135 - 145 mmol/L   Potassium 4.3 3.5 - 5.1 mmol/L   Chloride 106 101 - 111 mmol/L   CO2 21 (L) 22 - 32 mmol/L   Glucose, Bld 157 (H) 65 - 99 mg/dL   BUN 21 (H) 6 - 20 mg/dL   Creatinine, Ser 1.48 (H) 0.44 - 1.00 mg/dL   Calcium 9.3 8.9 - 10.3 mg/dL   Total Protein 7.3 6.5 - 8.1 g/dL   Albumin 3.0 (L) 3.5 - 5.0 g/dL   AST 29 15 - 41 U/L   ALT 22 14 - 54 U/L   Alkaline Phosphatase 79 38 - 126 U/L   Total Bilirubin 0.5 0.3 - 1.2 mg/dL   GFR calc non Af Amer 32 (L) >60 mL/min   GFR calc Af Amer 37 (L) >60 mL/min    Comment: (NOTE) The eGFR has been calculated using the CKD EPI equation. This calculation has not been validated in all clinical situations. eGFR's persistently <60 mL/min signify possible Chronic Kidney Disease.    Anion gap 9 5 - 15  CBC with Differential     Status: Abnormal   Collection Time: 06/22/16 12:21 PM  Result Value Ref Range   WBC 10.4 4.0 - 10.5 K/uL   RBC 4.05 3.87 - 5.11 MIL/uL    Hemoglobin 11.0 (L) 12.0 - 15.0 g/dL   HCT 32.8 (L) 36.0 - 46.0 %   MCV 81.0 78.0 - 100.0 fL   MCH 27.2 26.0 - 34.0 pg   MCHC 33.5 30.0 - 36.0 g/dL   RDW 14.1 11.5 - 15.5 %   Platelets 233 150 - 400 K/uL   Neutrophils Relative % 82 %   Neutro Abs 8.4 (H) 1.7 - 7.7 K/uL   Lymphocytes Relative 10 %   Lymphs Abs 1.1 0.7 - 4.0 K/uL   Monocytes Relative 8 %   Monocytes Absolute 0.9 0.1 - 1.0 K/uL   Eosinophils Relative 0 %   Eosinophils Absolute 0.0 0.0 - 0.7 K/uL   Basophils Relative 0 %   Basophils Absolute 0.0 0.0 - 0.1 K/uL  I-Stat CG4 Lactic Acid, ED     Status: None   Collection Time: 06/22/16  12:47 PM  Result Value Ref Range   Lactic Acid, Venous 1.70 0.5 - 1.9 mmol/L  Blood culture (routine x 2)     Status: None (Preliminary result)   Collection Time: 06/22/16  1:16 PM  Result Value Ref Range   Specimen Description BLOOD RIGHT ANTECUBITAL    Special Requests BOTTLES DRAWN AEROBIC ONLY 6CC    Culture  Setup Time      AEROBIC BOTTLE ONLY GRAM POSITIVE COCCI IN CHAINS CRITICAL RESULT CALLED TO, READ BACK BY AND VERIFIED WITH: TO LBAJBUS(PHARMD) BY TCLEVELAND 06/23/2016 AT 5:24AM    Culture NO GROWTH < 24 HOURS    Report Status PENDING   Blood culture (routine x 2)     Status: None (Preliminary result)   Collection Time: 06/22/16  1:22 PM  Result Value Ref Range   Specimen Description BLOOD RIGHT HAND    Special Requests BOTTLES DRAWN AEROBIC AND ANAEROBIC 5CC    Culture  Setup Time      IN BOTH AEROBIC AND ANAEROBIC BOTTLES GRAM POSITIVE COCCI IN CHAINS CRITICAL RESULT CALLED TO, READ BACK BY AND VERIFIED WITH: TO  LBAJBUS(PHARMD) BY TCLEVELAND 06/23/2016 AT 5:24AM    Culture NO GROWTH < 24 HOURS    Report Status PENDING   Blood Culture ID Panel (Reflexed)     Status: Abnormal   Collection Time: 06/22/16  1:22 PM  Result Value Ref Range   Enterococcus species NOT DETECTED NOT DETECTED   Listeria monocytogenes NOT DETECTED NOT DETECTED   Staphylococcus species NOT  DETECTED NOT DETECTED   Staphylococcus aureus NOT DETECTED NOT DETECTED   Streptococcus species DETECTED (A) NOT DETECTED    Comment: CRITICAL RESULT CALLED TO, READ BACK BY AND VERIFIED WITH: TO LBAJBUS(PHARMD) BY TCLEVELAND 06/23/2016 AT 5:24AM    Streptococcus agalactiae NOT DETECTED NOT DETECTED   Streptococcus pneumoniae NOT DETECTED NOT DETECTED   Streptococcus pyogenes NOT DETECTED NOT DETECTED   Acinetobacter baumannii NOT DETECTED NOT DETECTED   Enterobacteriaceae species NOT DETECTED NOT DETECTED   Enterobacter cloacae complex NOT DETECTED NOT DETECTED   Escherichia coli NOT DETECTED NOT DETECTED   Klebsiella oxytoca NOT DETECTED NOT DETECTED   Klebsiella pneumoniae NOT DETECTED NOT DETECTED   Proteus species NOT DETECTED NOT DETECTED   Serratia marcescens NOT DETECTED NOT DETECTED   Haemophilus influenzae NOT DETECTED NOT DETECTED   Neisseria meningitidis NOT DETECTED NOT DETECTED   Pseudomonas aeruginosa NOT DETECTED NOT DETECTED   Candida albicans NOT DETECTED NOT DETECTED   Candida glabrata NOT DETECTED NOT DETECTED   Candida krusei NOT DETECTED NOT DETECTED   Candida parapsilosis NOT DETECTED NOT DETECTED   Candida tropicalis NOT DETECTED NOT DETECTED  Protime-INR     Status: Abnormal   Collection Time: 06/22/16  4:14 PM  Result Value Ref Range   Prothrombin Time 39.4 (H) 11.4 - 15.2 seconds   INR 3.92   Urinalysis, Routine w reflex microscopic     Status: None   Collection Time: 06/22/16  5:39 PM  Result Value Ref Range   Color, Urine YELLOW YELLOW   APPearance CLEAR CLEAR   Specific Gravity, Urine 1.005 1.005 - 1.030   pH 5.0 5.0 - 8.0   Glucose, UA NEGATIVE NEGATIVE mg/dL   Hgb urine dipstick NEGATIVE NEGATIVE   Bilirubin Urine NEGATIVE NEGATIVE   Ketones, ur NEGATIVE NEGATIVE mg/dL   Protein, ur NEGATIVE NEGATIVE mg/dL   Nitrite NEGATIVE NEGATIVE   Leukocytes, UA NEGATIVE NEGATIVE  Influenza panel by PCR (type A & B)  Status: None   Collection Time:  06/23/16  1:51 AM  Result Value Ref Range   Influenza A By PCR NEGATIVE NEGATIVE   Influenza B By PCR NEGATIVE NEGATIVE    Comment: (NOTE) The Xpert Xpress Flu assay is intended as an aid in the diagnosis of  influenza and should not be used as a sole basis for treatment.  This  assay is FDA approved for nasopharyngeal swab specimens only. Nasal  washings and aspirates are unacceptable for Xpert Xpress Flu testing.   Comprehensive metabolic panel     Status: Abnormal   Collection Time: 06/23/16  4:34 AM  Result Value Ref Range   Sodium 135 135 - 145 mmol/L   Potassium 4.1 3.5 - 5.1 mmol/L   Chloride 107 101 - 111 mmol/L   CO2 20 (L) 22 - 32 mmol/L   Glucose, Bld 108 (H) 65 - 99 mg/dL   BUN 15 6 - 20 mg/dL   Creatinine, Ser 1.15 (H) 0.44 - 1.00 mg/dL   Calcium 8.8 (L) 8.9 - 10.3 mg/dL   Total Protein 6.8 6.5 - 8.1 g/dL   Albumin 2.7 (L) 3.5 - 5.0 g/dL   AST 26 15 - 41 U/L   ALT 20 14 - 54 U/L   Alkaline Phosphatase 72 38 - 126 U/L   Total Bilirubin 0.7 0.3 - 1.2 mg/dL   GFR calc non Af Amer 44 (L) >60 mL/min   GFR calc Af Amer 51 (L) >60 mL/min    Comment: (NOTE) The eGFR has been calculated using the CKD EPI equation. This calculation has not been validated in all clinical situations. eGFR's persistently <60 mL/min signify possible Chronic Kidney Disease.    Anion gap 8 5 - 15  CBC     Status: Abnormal   Collection Time: 06/23/16  4:34 AM  Result Value Ref Range   WBC 7.7 4.0 - 10.5 K/uL   RBC 3.73 (L) 3.87 - 5.11 MIL/uL   Hemoglobin 10.2 (L) 12.0 - 15.0 g/dL   HCT 29.9 (L) 36.0 - 46.0 %   MCV 80.2 78.0 - 100.0 fL   MCH 27.3 26.0 - 34.0 pg   MCHC 34.1 30.0 - 36.0 g/dL   RDW 14.2 11.5 - 15.5 %   Platelets 206 150 - 400 K/uL  Protime-INR     Status: Abnormal   Collection Time: 06/23/16  4:34 AM  Result Value Ref Range   Prothrombin Time 34.5 (H) 11.4 - 15.2 seconds   INR 3.32     MICRO: 1/25 blood cx  X 1 strep viridans 1/27 blood cx x 2 - strep (identification  pending)  Assessment/Plan:  83yo F with history of CAD, AFIB, CVA admitted for 2 week history of fevers, malaise found to have streptococcal bacteremia per her PCP  - continue with ceftriaxone 2gm IV daily - recommend to start work up for endocarditis, TTE but preferably will also need to get TEE - repeat blood cx tomorrow evening to ensure she is clearing her bacteremia - etiology still perplexing. Recommend to get thoracic/lumbar CT if TTE is negative due to concern for discitis since she said she also had back pain that radiated to left shoulder blade initially with onset of fevers  Health maintenance = would check hep C ab   Dr [name] to see tomorrow  Caren Griffins B. [facility] [phone]

## 2016-06-23 NOTE — Evaluation (Signed)
Occupational Therapy Evaluation and Discharge Patient Details Name: Alexis [name] MRN: [id number] DOB: [birth year] Today's Date: 06/23/2016    History of Present Illness 83 y/o female presented to ED with concern for generalized weakness and fevers over the last 2 weeks. Her primary care physician ordered blood cultures which one out of one blood culture returned showing gram-positive cocci in chains. Pt has no past medical history on file. It should be noted that this patient does not speak english, [place] is her native and preferred language.   Clinical Impression   PTA Pt functioning at independent level for ADL and mobility. Pt currently independent for ADL and mobility. Shortness of breath noted, but no dizzness or lightheadedness. Energy conservation and falls education completed (handouts provided). BUE WFL. Pt's daughter (who acted as interpreter for session) asking for information about caregiver education as her mother ages. No further OT needs at this time. OT to sign off. Thank you for this referral.     Follow Up Recommendations  No OT follow up;Supervision - Intermittent    Equipment Recommendations  None recommended by OT    Recommendations for Other Services       Precautions / Restrictions Precautions Precautions: Other (comment) (droplet) Restrictions Weight Bearing Restrictions: No      Mobility Bed Mobility Overal bed mobility: Independent                Transfers Overall transfer level: Independent                    Balance Overall balance assessment: No apparent balance deficits (not formally assessed)                                          ADL Overall ADL's : Independent                                       General ADL Comments: Pt able to ambulate into the bathroom, use the toilet, perform peri care, perform sink level grooming, sit EOB and son/doff sock     Vision Vision Assessment?:  No apparent visual deficits   Perception     Praxis      Pertinent Vitals/Pain Pain Assessment: No/denies pain     Hand Dominance     Extremity/Trunk Assessment Upper Extremity Assessment Upper Extremity Assessment: Overall WFL for tasks assessed (Functional strength within limits grossly 4/5 BUE)   Lower Extremity Assessment Lower Extremity Assessment: Overall WFL for tasks assessed   Cervical / Trunk Assessment Cervical / Trunk Assessment: Kyphotic   Communication Communication Communication: Prefers language other than English;Other (comment) (Daughter used as interpreter)   Cognition Arousal/Alertness: Awake/alert Behavior During Therapy: WFL for tasks assessed/performed Overall Cognitive Status: Within Functional Limits for tasks assessed                     General Comments       Exercises       Shoulder Instructions      Home Living Family/patient expects to be discharged to:: Private residence Living Arrangements: Children Available Help at Discharge: Family;Available PRN/intermittently (daughter works) Type of Home: [employer] Layout: One level     Bathroom Shower/Tub: Tub/shower unit Shower/tub characteristics: Architectural technologist: Standard  Prior Functioning/Environment Level of Independence: Independent                 OT Problem List:     OT Treatment/Interventions:      OT Goals(Current goals can be found in the care plan section) Acute Rehab OT Goals Patient Stated Goal: to get better OT Goal Formulation: With patient Time For Goal Achievement: 06/30/16 Potential to Achieve Goals: Good  OT Frequency:     Barriers to D/C:            Co-evaluation              End of Session Nurse Communication: Mobility status (OT to sign off)  Activity Tolerance: Patient tolerated treatment well Patient left: in bed   Time: 1220-1245 OT Time Calculation (min): 25 min Charges:  OT General  Charges $OT Visit: 1 Procedure OT Evaluation $OT Eval Low Complexity: 1 Procedure OT Treatments $Self Care/Home Management : 8-22 mins G-Codes: OT G-codes **NOT FOR INPATIENT CLASS** Functional Assessment Tool Used: Clinical Judgement Functional Limitation: Self care Self Care Current Status (B1478): At least 1 percent but less than 20 percent impaired, limited or restricted Self Care Goal Status (G9562): At least 1 percent but less than 20 percent impaired, limited or restricted Self Care Discharge Status [phone]): At least 1 percent but less than 20 percent impaired, limited or restricted  Jaci Carrel 06/23/2016, 1:32 PM Hulda Humphrey OTR/L [phone]

## 2016-06-23 NOTE — Progress Notes (Signed)
Pt with bp running 98/52 manually. Paged Grandville Silos, who stated to hold pt's lasix.

## 2016-06-23 NOTE — Progress Notes (Signed)
PROGRESS NOTE    Alexis [name]  YQM:[id number] DOB: 1933-08-24 DOA: 06/22/2016 PCP: [name], MD    Brief Narrative:  Patient is a 83 year old female history of hypertension, coronary artery disease status post CABG in 2004, CVA, atrial fibrillation on chronic Coumadin presenting to the ED with positive blood cultures from PCPs office.   Assessment & Plan:   Principal Problem:   Bacteremia due to Streptococcus Active Problems:   Essential hypertension   Coronary atherosclerosis   Congestive heart failure (HCC)   Long term current use of anticoagulant therapy   Eczema   Positive blood cultures  #1 Streptococcus viridans bacteremia Patient was sent to the ED from PCPs office due to positive blood cultures. Patient was noted to be having low-grade fevers, chills, generalized weakness or fatigue. Cultures drawn at PCPs office from 06/20/2016 was positive for Streptococcus viridans. Repeat blood cultures preliminary results positive for Streptococcus. Will check a 2-D echo to rule out endocarditis. IV antibiotics have a narrowed down to IV Rocephin. Consult with ID for antibiotic recommendations and duration.  #2 hypertension Blood pressure borderline. Monitor closely. Hold diuretics today.  #3 congestive heart failure/ CAD Patient is euvolemic on examination with no signs of volume overload. Blood pressure is borderline. Hold diuretics. Continue bisoprolol, Lipitor.  #4 atrial fibrillation CHA2DS2VASC score 6 Continue bisoprolol for rate control. INR supratherapeutic. Coumadin on hold.   DVT prophylaxis: Supratherapeutic INR. Was on Coumadin prior to admission. Code Status: Full Family Communication: Updated patient and daughter at bedside. Daughter translated for patient. Disposition Plan: Home when medically stable and per recommendations of infectious diseases.   Consultants:   Infectious diseases: Dr. Baxter Flattery 06/23/2016  Procedures:   None  Antimicrobials:     IV Rocephin 06/23/2016  IV vancomycin 06/22/2016>>>> 06/23/2016   Subjective: Patient sitting up in bed. Patient denies any chest pain. Patient denies any shortness of breath. Patient states some improvement in weakness.  Objective: Vitals:   06/22/16 1930 06/22/16 2024 06/23/16 0314 06/23/16 0521  BP:  (!) 141/72  132/70  Pulse:  79  (!) 102  Resp:  19  19  Temp: 98.3 F (36.8 C) 98.5 F (36.9 C)  98.7 F (37.1 C)  TempSrc:    Oral  SpO2:  95%  95%  Weight:   72.4 kg (159 lb 9.8 oz)   Height:        Intake/Output Summary (Last 24 hours) at 06/23/16 1324 Last data filed at 06/23/16 1100  Gross per 24 hour  Intake              900 ml  Output                0 ml  Net              900 ml   Filed Weights   06/22/16 1309 06/23/16 0314  Weight: 72.6 kg (160 lb 0.9 oz) 72.4 kg (159 lb 9.8 oz)    Examination:  General exam: Appears calm and comfortable  Respiratory system: Clear to auscultation. Respiratory effort normal. Cardiovascular system: S1 & S2 heard, RRR. No JVD, murmurs, rubs, gallops or clicks. No pedal edema. Gastrointestinal system: Abdomen is nondistended, soft and nontender. No organomegaly or masses felt. Normal bowel sounds heard. Central nervous system: Alert and oriented. No focal neurological deficits. Extremities: Symmetric 5 x 5 power. Skin: No rashes, lesions or ulcers Psychiatry: Judgement and insight appear normal. Mood & affect appropriate.     Data Reviewed: I have personally  reviewed following labs and imaging studies  CBC:  Recent Labs Lab 06/20/16 1637 06/22/16 1221 06/23/16 0434  WBC 8.1 10.4 7.7  NEUTROABS 6.5 8.4*  --   HGB 11.1* 11.0* 10.2*  HCT 32.5* 32.8* 29.9*  MCV 80.8 81.0 80.2  PLT 231.0 233 696   Basic Metabolic Panel:  Recent Labs Lab 06/22/16 1221 06/23/16 0434  NA 136 135  K 4.3 4.1  CL 106 107  CO2 21* 20*  GLUCOSE 157* 108*  BUN 21* 15  CREATININE 1.48* 1.15*  CALCIUM 9.3 8.8*   GFR: Estimated  Creatinine Clearance: 33.8 mL/min (by C-G formula based on SCr of 1.15 mg/dL (H)). Liver Function Tests:  Recent Labs Lab 06/20/16 1637 06/22/16 1221 06/23/16 0434  AST 25 29 26   ALT 24 22 20   ALKPHOS 77 79 72  BILITOT 0.7 0.5 0.7  PROT 7.5 7.3 6.8  ALBUMIN 3.4* 3.0* 2.7*   No results for input(s): LIPASE, AMYLASE in the last 168 hours. No results for input(s): AMMONIA in the last 168 hours. Coagulation Profile:  Recent Labs Lab 06/22/16 1614 06/23/16 0434  INR 3.92 3.32   Cardiac Enzymes: No results for input(s): CKTOTAL, CKMB, CKMBINDEX, TROPONINI in the last 168 hours. BNP (last 3 results) No results for input(s): PROBNP in the last 8760 hours. HbA1C: No results for input(s): HGBA1C in the last 72 hours. CBG: No results for input(s): GLUCAP in the last 168 hours. Lipid Profile: No results for input(s): CHOL, HDL, LDLCALC, TRIG, CHOLHDL, LDLDIRECT in the last 72 hours. Thyroid Function Tests: No results for input(s): TSH, T4TOTAL, FREET4, T3FREE, THYROIDAB in the last 72 hours. Anemia Panel: No results for input(s): VITAMINB12, FOLATE, FERRITIN, TIBC, IRON, RETICCTPCT in the last 72 hours. Sepsis Labs:  Recent Labs Lab 06/22/16 1247  LATICACIDVEN 1.70    Recent Results (from the past 240 hour(s))  Blood culture (routine single)     Status: None   Collection Time: 06/20/16  4:37 PM  Result Value Ref Range Status   Culture VIRIDANS STREPTOCOCCUS  Final    Comment: Culture results may be compromised due to an excessive volume of blood received in culture bottles. Gram Stain Report Called to,Read Back By and Verified With: STACI VOICEMAIL DR [facility] 06-21-16 1140    Organism ID, Bacteria VIRIDANS STREPTOCOCCUS  Final      Susceptibility   Viridans streptococcus -  (no method available)    PENICILLIN 0.094 Intermediate     CEFTRIAXONE 0.047 Sensitive     LEVOFLOXACIN 0.75 Sensitive   Blood culture (routine x 2)     Status: None (Preliminary  result)   Collection Time: 06/22/16  1:16 PM  Result Value Ref Range Status   Specimen Description BLOOD RIGHT ANTECUBITAL  Final   Special Requests BOTTLES DRAWN AEROBIC ONLY 6CC  Final   Culture  Setup Time   Final    AEROBIC BOTTLE ONLY GRAM POSITIVE COCCI IN CHAINS CRITICAL RESULT CALLED TO, READ BACK BY AND VERIFIED WITH: TO LBAJBUS(PHARMD) BY TCLEVELAND 06/23/2016 AT 5:24AM    Culture NO GROWTH < 24 HOURS  Final   Report Status PENDING  Incomplete  Blood culture (routine x 2)     Status: None (Preliminary result)   Collection Time: 06/22/16  1:22 PM  Result Value Ref Range Status   Specimen Description BLOOD RIGHT HAND  Final   Special Requests BOTTLES DRAWN AEROBIC AND ANAEROBIC 5CC  Final   Culture  Setup Time   Final    IN  BOTH AEROBIC AND ANAEROBIC BOTTLES GRAM POSITIVE COCCI IN CHAINS CRITICAL RESULT CALLED TO, READ BACK BY AND VERIFIED WITH: TO  LBAJBUS(PHARMD) BY TCLEVELAND 06/23/2016 AT 5:24AM    Culture NO GROWTH < 24 HOURS  Final   Report Status PENDING  Incomplete  Blood Culture ID Panel (Reflexed)     Status: Abnormal   Collection Time: 06/22/16  1:22 PM  Result Value Ref Range Status   Enterococcus species NOT DETECTED NOT DETECTED Final   Listeria monocytogenes NOT DETECTED NOT DETECTED Final   Staphylococcus species NOT DETECTED NOT DETECTED Final   Staphylococcus aureus NOT DETECTED NOT DETECTED Final   Streptococcus species DETECTED (A) NOT DETECTED Final    Comment: CRITICAL RESULT CALLED TO, READ BACK BY AND VERIFIED WITH: TO LBAJBUS(PHARMD) BY TCLEVELAND 06/23/2016 AT 5:24AM    Streptococcus agalactiae NOT DETECTED NOT DETECTED Final   Streptococcus pneumoniae NOT DETECTED NOT DETECTED Final   Streptococcus pyogenes NOT DETECTED NOT DETECTED Final   Acinetobacter baumannii NOT DETECTED NOT DETECTED Final   Enterobacteriaceae species NOT DETECTED NOT DETECTED Final   Enterobacter cloacae complex NOT DETECTED NOT DETECTED Final   Escherichia coli NOT  DETECTED NOT DETECTED Final   Klebsiella oxytoca NOT DETECTED NOT DETECTED Final   Klebsiella pneumoniae NOT DETECTED NOT DETECTED Final   Proteus species NOT DETECTED NOT DETECTED Final   Serratia marcescens NOT DETECTED NOT DETECTED Final   Haemophilus influenzae NOT DETECTED NOT DETECTED Final   Neisseria meningitidis NOT DETECTED NOT DETECTED Final   Pseudomonas aeruginosa NOT DETECTED NOT DETECTED Final   Candida albicans NOT DETECTED NOT DETECTED Final   Candida glabrata NOT DETECTED NOT DETECTED Final   Candida krusei NOT DETECTED NOT DETECTED Final   Candida parapsilosis NOT DETECTED NOT DETECTED Final   Candida tropicalis NOT DETECTED NOT DETECTED Final         Radiology Studies: No results found.      Scheduled Meds: . atorvastatin  10 mg Oral Daily  . bisoprolol  2.5 mg Oral Daily  . cefTRIAXone (ROCEPHIN)  IV  2 g Intravenous Q24H  . ramipril  5 mg Oral Daily  . sodium chloride  1,000 mL Intravenous Once  . sodium chloride flush  3 mL Intravenous Q12H  . torsemide  2.5 mg Oral Daily   Continuous Infusions: . sodium chloride 75 mL/hr (06/22/16 1530)     LOS: 0 days    Time spent: 35 minutes    Colie Fugitt, MD [facility] Pager [phone]  If 7PM-7AM, please contact night-coverage [email or web address] Password [email or web address] 06/23/2016, 1:24 PM

## 2016-06-24 ENCOUNTER — Inpatient Hospital Stay (HOSPITAL_COMMUNITY): Payer: Self-pay

## 2016-06-24 DIAGNOSIS — Z952 Presence of prosthetic heart valve: Secondary | ICD-10-CM

## 2016-06-24 DIAGNOSIS — M549 Dorsalgia, unspecified: Secondary | ICD-10-CM

## 2016-06-24 DIAGNOSIS — R7881 Bacteremia: Secondary | ICD-10-CM

## 2016-06-24 LAB — CBC
HCT: 28.3 % — ABNORMAL LOW (ref 36.0–46.0)
Hemoglobin: 9.3 g/dL — ABNORMAL LOW (ref 12.0–15.0)
MCH: 26.4 pg (ref 26.0–34.0)
MCHC: 32.9 g/dL (ref 30.0–36.0)
MCV: 80.4 fL (ref 78.0–100.0)
PLATELETS: 199 10*3/uL (ref 150–400)
RBC: 3.52 MIL/uL — ABNORMAL LOW (ref 3.87–5.11)
RDW: 14.1 % (ref 11.5–15.5)
WBC: 6.9 10*3/uL (ref 4.0–10.5)

## 2016-06-24 LAB — BASIC METABOLIC PANEL
Anion gap: 11 (ref 5–15)
BUN: 15 mg/dL (ref 6–20)
CALCIUM: 8.7 mg/dL — AB (ref 8.9–10.3)
CO2: 20 mmol/L — AB (ref 22–32)
CREATININE: 1.07 mg/dL — AB (ref 0.44–1.00)
Chloride: 107 mmol/L (ref 101–111)
GFR calc Af Amer: 55 mL/min — ABNORMAL LOW (ref >60)
GFR calc non Af Amer: 48 mL/min — ABNORMAL LOW (ref >60)
GLUCOSE: 103 mg/dL — AB (ref 65–99)
Potassium: 3.8 mmol/L (ref 3.5–5.1)
Sodium: 138 mmol/L (ref 135–145)

## 2016-06-24 LAB — URINE CULTURE: CULTURE: NO GROWTH

## 2016-06-24 LAB — ECHOCARDIOGRAM COMPLETE
Height: 59.055 in
WEIGHTICAEL: 2543.23 [oz_av]

## 2016-06-24 LAB — PROTIME-INR
INR: 2.76
Prothrombin Time: 29.8 s — ABNORMAL HIGH (ref 11.4–15.2)

## 2016-06-24 MED ORDER — WARFARIN SODIUM 1 MG PO TABS
1.0000 mg | ORAL_TABLET | Freq: Once | ORAL | Status: AC
  Administered 2016-06-24: 1 mg via ORAL
  Filled 2016-06-24: qty 1

## 2016-06-24 MED ORDER — WARFARIN - PHARMACIST DOSING INPATIENT: Freq: Every day | Status: DC

## 2016-06-24 NOTE — Progress Notes (Signed)
PROGRESS NOTE    Alexis [name]  KPV:[id number] DOB: 04-01-1934 DOA: 06/22/2016 PCP: [name], MD    Brief Narrative:  Patient is a 83 year old female history of hypertension, coronary artery disease status post CABG in 2004, CVA, atrial fibrillation on chronic Coumadin presenting to the ED with positive blood cultures from PCPs office.   Assessment & Plan:   Principal Problem:   Bacteremia due to Streptococcus Active Problems:   Essential hypertension   Coronary atherosclerosis   Congestive heart failure (HCC)   Long term current use of anticoagulant therapy   Eczema   Positive blood cultures   Atrial fibrillation (HCC)  #1 Streptococcus viridans bacteremia Patient was sent to the ED from PCPs office due to positive blood cultures. Patient was noted to be having low-grade fevers, chills, generalized weakness or fatigue. Cultures drawn at PCPs office from 06/20/2016 was positive for Streptococcus viridans. Repeat blood cultures preliminary results positive for Streptococcus. 2-D echo pending to rule out endocarditis. IV antibiotics have a narrowed down to IV Rocephin. ID following and appreciate input and recommendations.   #2 hypertension Blood pressure improved. Will saline lock IV fluids.   #3 congestive heart failure/ CAD Patient is euvolemic on examination with no signs of volume overload. Blood pressure improved with hydration. Saline lock IV fluids. Continue bisoprolol, Lipitor, Lasix.  #4 atrial fibrillation CHA2DS2VASC score 6 Continue bisoprolol for rate control. INR therapeutic. Coumadin to be resumed per pharmacy.    DVT prophylaxis: INR therapeutic. On Coumadin.  Code Status: Full Family Communication: Updated patient and daughter at bedside. Daughter translated for patient. Disposition Plan: Home when medically stable and per recommendations of infectious diseases.   Consultants:   Infectious diseases: Dr. Baxter Flattery 06/23/2016  Procedures:   2-D  echo 06/24/2016  Antimicrobials:   IV Rocephin 06/23/2016  IV vancomycin 06/22/2016>>>> 06/23/2016   Subjective: Patient laying in bed with some concerns for SOB. No CP. Feeling better than on admission. sitting up in Patient states some improvement in weakness.  Objective: Vitals:   06/23/16 1445 06/23/16 2111 06/24/16 0500 06/24/16 0615  BP: (!) 98/52 (!) 147/73  (!) 149/75  Pulse:  73  91  Resp:  18  18  Temp:  98.7 F (37.1 C)  99.1 F (37.3 C)  TempSrc:    Oral  SpO2:  97%  90%  Weight:   72.1 kg (158 lb 15.2 oz)   Height:        Intake/Output Summary (Last 24 hours) at 06/24/16 1404 Last data filed at 06/24/16 0932  Gross per 24 hour  Intake             1270 ml  Output                0 ml  Net             1270 ml   Filed Weights   06/22/16 1309 06/23/16 0314 06/24/16 0500  Weight: 72.6 kg (160 lb 0.9 oz) 72.4 kg (159 lb 9.8 oz) 72.1 kg (158 lb 15.2 oz)    Examination:  General exam: Appears calm and comfortable  Respiratory system: Clear to auscultation. Respiratory effort normal. Cardiovascular system: S1 & S2 heard, RRR. No JVD, murmurs, rubs, gallops or clicks. No pedal edema. Gastrointestinal system: Abdomen is nondistended, soft and nontender. No organomegaly or masses felt. Normal bowel sounds heard. Central nervous system: Alert and oriented. No focal neurological deficits. Extremities: Symmetric 5 x 5 power. Skin: No rashes, lesions or ulcers Psychiatry: Judgement  and insight appear normal. Mood & affect appropriate.     Data Reviewed: I have personally reviewed following labs and imaging studies  CBC:  Recent Labs Lab 06/20/16 1637 06/22/16 1221 06/23/16 0434 06/24/16 0554  WBC 8.1 10.4 7.7 6.9  NEUTROABS 6.5 8.4*  --   --   HGB 11.1* 11.0* 10.2* 9.3*  HCT 32.5* 32.8* 29.9* 28.3*  MCV 80.8 81.0 80.2 80.4  PLT 231.0 233 206 010   Basic Metabolic Panel:  Recent Labs Lab 06/22/16 1221 06/23/16 0434 06/24/16 0554  NA 136 135 138    K 4.3 4.1 3.8  CL 106 107 107  CO2 21* 20* 20*  GLUCOSE 157* 108* 103*  BUN 21* 15 15  CREATININE 1.48* 1.15* 1.07*  CALCIUM 9.3 8.8* 8.7*   GFR: Estimated Creatinine Clearance: 36.3 mL/min (by C-G formula based on SCr of 1.07 mg/dL (H)). Liver Function Tests:  Recent Labs Lab 06/20/16 1637 06/22/16 1221 06/23/16 0434  AST 25 29 26   ALT 24 22 20   ALKPHOS 77 79 72  BILITOT 0.7 0.5 0.7  PROT 7.5 7.3 6.8  ALBUMIN 3.4* 3.0* 2.7*   No results for input(s): LIPASE, AMYLASE in the last 168 hours. No results for input(s): AMMONIA in the last 168 hours. Coagulation Profile:  Recent Labs Lab 06/22/16 1614 06/23/16 0434 06/24/16 0554  INR 3.92 3.32 2.76   Cardiac Enzymes: No results for input(s): CKTOTAL, CKMB, CKMBINDEX, TROPONINI in the last 168 hours. BNP (last 3 results) No results for input(s): PROBNP in the last 8760 hours. HbA1C: No results for input(s): HGBA1C in the last 72 hours. CBG: No results for input(s): GLUCAP in the last 168 hours. Lipid Profile: No results for input(s): CHOL, HDL, LDLCALC, TRIG, CHOLHDL, LDLDIRECT in the last 72 hours. Thyroid Function Tests: No results for input(s): TSH, T4TOTAL, FREET4, T3FREE, THYROIDAB in the last 72 hours. Anemia Panel: No results for input(s): VITAMINB12, FOLATE, FERRITIN, TIBC, IRON, RETICCTPCT in the last 72 hours. Sepsis Labs:  Recent Labs Lab 06/22/16 1247  LATICACIDVEN 1.70    Recent Results (from the past 240 hour(s))  Blood culture (routine single)     Status: None   Collection Time: 06/20/16  4:37 PM  Result Value Ref Range Status   Culture VIRIDANS STREPTOCOCCUS  Final    Comment: Culture results may be compromised due to an excessive volume of blood received in culture bottles. Gram Stain Report Called to,Read Back By and Verified With: STACI VOICEMAIL DR [facility] OFFICE 06-21-16 1140    Organism ID, Bacteria VIRIDANS STREPTOCOCCUS  Final      Susceptibility   Viridans streptococcus -   (no method available)    PENICILLIN 0.094 Intermediate     CEFTRIAXONE 0.047 Sensitive     LEVOFLOXACIN 0.75 Sensitive   Blood culture (routine x 2)     Status: Abnormal (Preliminary result)   Collection Time: 06/22/16  1:16 PM  Result Value Ref Range Status   Specimen Description BLOOD RIGHT ANTECUBITAL  Final   Special Requests BOTTLES DRAWN AEROBIC ONLY 6CC  Final   Culture  Setup Time   Final    AEROBIC BOTTLE ONLY GRAM POSITIVE COCCI IN CHAINS CRITICAL RESULT CALLED TO, READ BACK BY AND VERIFIED WITH: TO LBAJBUS(PHARMD) BY TCLEVELAND 06/23/2016 AT 5:24AM    Culture STREPTOCOCCUS SPECIES IDENTIFICATION TO FOLLOW  (A)  Final   Report Status PENDING  Incomplete  Blood culture (routine x 2)     Status: Abnormal (Preliminary result)   Collection Time: 06/22/16  1:22 PM  Result Value Ref Range Status   Specimen Description BLOOD RIGHT HAND  Final   Special Requests BOTTLES DRAWN AEROBIC AND ANAEROBIC 5CC  Final   Culture  Setup Time   Final    IN BOTH AEROBIC AND ANAEROBIC BOTTLES GRAM POSITIVE COCCI IN CHAINS CRITICAL RESULT CALLED TO, READ BACK BY AND VERIFIED WITH: TO  LBAJBUS(PHARMD) BY TCLEVELAND 06/23/2016 AT 5:24AM    Culture (A)  Final    STREPTOCOCCUS SPECIES IDENTIFICATION AND SUSCEPTIBILITIES TO FOLLOW    Report Status PENDING  Incomplete  Blood Culture ID Panel (Reflexed)     Status: Abnormal   Collection Time: 06/22/16  1:22 PM  Result Value Ref Range Status   Enterococcus species NOT DETECTED NOT DETECTED Final   Listeria monocytogenes NOT DETECTED NOT DETECTED Final   Staphylococcus species NOT DETECTED NOT DETECTED Final   Staphylococcus aureus NOT DETECTED NOT DETECTED Final   Streptococcus species DETECTED (A) NOT DETECTED Final    Comment: CRITICAL RESULT CALLED TO, READ BACK BY AND VERIFIED WITH: TO LBAJBUS(PHARMD) BY TCLEVELAND 06/23/2016 AT 5:24AM    Streptococcus agalactiae NOT DETECTED NOT DETECTED Final   Streptococcus pneumoniae NOT DETECTED NOT  DETECTED Final   Streptococcus pyogenes NOT DETECTED NOT DETECTED Final   Acinetobacter baumannii NOT DETECTED NOT DETECTED Final   Enterobacteriaceae species NOT DETECTED NOT DETECTED Final   Enterobacter cloacae complex NOT DETECTED NOT DETECTED Final   Escherichia coli NOT DETECTED NOT DETECTED Final   Klebsiella oxytoca NOT DETECTED NOT DETECTED Final   Klebsiella pneumoniae NOT DETECTED NOT DETECTED Final   Proteus species NOT DETECTED NOT DETECTED Final   Serratia marcescens NOT DETECTED NOT DETECTED Final   Haemophilus influenzae NOT DETECTED NOT DETECTED Final   Neisseria meningitidis NOT DETECTED NOT DETECTED Final   Pseudomonas aeruginosa NOT DETECTED NOT DETECTED Final   Candida albicans NOT DETECTED NOT DETECTED Final   Candida glabrata NOT DETECTED NOT DETECTED Final   Candida krusei NOT DETECTED NOT DETECTED Final   Candida parapsilosis NOT DETECTED NOT DETECTED Final   Candida tropicalis NOT DETECTED NOT DETECTED Final  Urine culture     Status: None   Collection Time: 06/22/16  5:39 PM  Result Value Ref Range Status   Specimen Description URINE, RANDOM  Final   Special Requests NONE  Final   Culture NO GROWTH  Final   Report Status 06/24/2016 FINAL  Final         Radiology Studies: No results found.      Scheduled Meds: . atorvastatin  10 mg Oral q1800  . bisoprolol  2.5 mg Oral Daily  . cefTRIAXone (ROCEPHIN)  IV  2 g Intravenous Q24H  . furosemide  5 mg Oral Daily  . ramipril  5 mg Oral Daily  . sodium chloride  1,000 mL Intravenous Once  . sodium chloride flush  3 mL Intravenous Q12H  . warfarin  1 mg Oral ONCE-1800  . Warfarin - Pharmacist Dosing Inpatient   Does not apply q1800   Continuous Infusions: . sodium chloride 1,000 mL (06/24/16 1113)     LOS: 1 day    Time spent: 64 minutes    Alexis Hutto, MD [facility] Pager [phone]  If 7PM-7AM, please contact night-coverage [email or web address] Password [email or web address] 06/24/2016, 2:04  PM

## 2016-06-24 NOTE — Progress Notes (Signed)
ANTICOAGULATION CONSULT NOTE - Follow Up Consult  Pharmacy Consult for Coumadin Indication: atrial fibrillation  Allergies  Allergen Reactions  . Hydralazine Hcl Other (See Comments)    Headache     Patient Measurements: Height: 4' 11.06" (150 cm) Weight: 158 lb 15.2 oz (72.1 kg) IBW/kg (Calculated) : 43.33  Vital Signs: Temp: 99.1 F (37.3 C) (01/29 0615) Temp Source: Oral (01/29 0615) BP: 149/75 (01/29 0615) Pulse Rate: 91 (01/29 0615)  Labs:  Recent Labs  06/22/16 1221 06/22/16 1614 06/23/16 0434 06/24/16 0554  HGB 11.0*  --  10.2* 9.3*  HCT 32.8*  --  29.9* 28.3*  PLT 233  --  206 199  LABPROT  --  39.4* 34.5* 29.8*  INR  --  3.92 3.32 2.76  CREATININE 1.48*  --  1.15* 1.07*    Estimated Creatinine Clearance: 36.3 mL/min (by C-G formula based on SCr of 1.07 mg/dL (H)).   Medications:  Scheduled:  . atorvastatin  10 mg Oral q1800  . bisoprolol  2.5 mg Oral Daily  . cefTRIAXone (ROCEPHIN)  IV  2 g Intravenous Q24H  . furosemide  5 mg Oral Daily  . ramipril  5 mg Oral Daily  . sodium chloride  1,000 mL Intravenous Once  . sodium chloride flush  3 mL Intravenous Q12H    Assessment: 83yo female with AFib, admitted with supratherapeutic INR & last dose on 1/26.  Hg decreased, pltc wnl.  No bleeding noted.    Goal of Therapy:  INR 2-3 Monitor platelets by anticoagulation protocol: Yes   Plan:  Coumadin 1mg  today Daily INR Watch for s/s of bleeding   Gracy Bruins, PharmD [facility]

## 2016-06-24 NOTE — Progress Notes (Signed)
  Echocardiogram 2D Echocardiogram has been performed.  Diamond Nickel 06/24/2016, 12:36 PM

## 2016-06-24 NOTE — Progress Notes (Signed)
[facility]   Reason for visit: Follow up on bacteremia  Interval History: afebrile, normal WBC, TTE without vegetation; no associated n/v/d/rash.  Eating ok.  Translation done with russian interpreter.     Physical Exam: Constitutional:  Vitals:   06/24/16 0615 06/24/16 1408  BP: (!) 149/75 113/68  Pulse: 91 80  Resp: 18 18  Temp: 99.1 F (37.3 C) 98.7 F (37.1 C)   patient appears in NAD Eyes: anicteric HENT: no thrush Respiratory: Normal respiratory effort; CTA B Cardiovascular: RRR GI: soft, nt, nd  Review of Systems: Constitutional: negative for fevers and chills Cardiovascular: negative for chest pressure/discomfort Gastrointestinal: negative for diarrhea  Lab Results  Component Value Date   WBC 6.9 06/24/2016   HGB 9.3 (L) 06/24/2016   HCT 28.3 (L) 06/24/2016   MCV 80.4 06/24/2016   PLT 199 06/24/2016    Lab Results  Component Value Date   CREATININE 1.07 (H) 06/24/2016   BUN 15 06/24/2016   NA 138 06/24/2016   K 3.8 06/24/2016   CL 107 06/24/2016   CO2 20 (L) 06/24/2016    Lab Results  Component Value Date   ALT 20 06/23/2016   AST 26 06/23/2016   ALKPHOS 72 06/23/2016     Microbiology: Recent Results (from the past 240 hour(s))  Blood culture (routine single)     Status: None   Collection Time: 06/20/16  4:37 PM  Result Value Ref Range Status   Culture VIRIDANS STREPTOCOCCUS  Final    Comment: Culture results may be compromised due to an excessive volume of blood received in culture bottles. Gram Stain Report Called to,Read Back By and Verified With: STACI VOICEMAIL DR PLOTNIKOV OFFICE 06-21-16 1140    Organism ID, Bacteria VIRIDANS STREPTOCOCCUS  Final      Susceptibility   Viridans streptococcus -  (no method available)    PENICILLIN 0.094 Intermediate     CEFTRIAXONE 0.047 Sensitive     LEVOFLOXACIN 0.75 Sensitive   Blood culture (routine x 2)     Status: Abnormal (Preliminary result)   Collection Time:  06/22/16  1:16 PM  Result Value Ref Range Status   Specimen Description BLOOD RIGHT ANTECUBITAL  Final   Special Requests BOTTLES DRAWN AEROBIC ONLY 6CC  Final   Culture  Setup Time   Final    AEROBIC BOTTLE ONLY GRAM POSITIVE COCCI IN CHAINS CRITICAL RESULT CALLED TO, READ BACK BY AND VERIFIED WITH: TO LBAJBUS(PHARMD) BY TCLEVELAND 06/23/2016 AT 5:24AM    Culture STREPTOCOCCUS SPECIES IDENTIFICATION TO FOLLOW  (A)  Final   Report Status PENDING  Incomplete  Blood culture (routine x 2)     Status: Abnormal (Preliminary result)   Collection Time: 06/22/16  1:22 PM  Result Value Ref Range Status   Specimen Description BLOOD RIGHT HAND  Final   Special Requests BOTTLES DRAWN AEROBIC AND ANAEROBIC 5CC  Final   Culture  Setup Time   Final    IN BOTH AEROBIC AND ANAEROBIC BOTTLES GRAM POSITIVE COCCI IN CHAINS CRITICAL RESULT CALLED TO, READ BACK BY AND VERIFIED WITH: TO  LBAJBUS(PHARMD) BY TCLEVELAND 06/23/2016 AT 5:24AM    Culture (A)  Final    STREPTOCOCCUS SPECIES IDENTIFICATION AND SUSCEPTIBILITIES TO FOLLOW    Report Status PENDING  Incomplete  Blood Culture ID Panel (Reflexed)     Status: Abnormal   Collection Time: 06/22/16  1:22 PM  Result Value Ref Range Status   Enterococcus species NOT DETECTED NOT DETECTED Final  Listeria monocytogenes NOT DETECTED NOT DETECTED Final   Staphylococcus species NOT DETECTED NOT DETECTED Final   Staphylococcus aureus NOT DETECTED NOT DETECTED Final   Streptococcus species DETECTED (A) NOT DETECTED Final    Comment: CRITICAL RESULT CALLED TO, READ BACK BY AND VERIFIED WITH: TO LBAJBUS(PHARMD) BY TCLEVELAND 06/23/2016 AT 5:24AM    Streptococcus agalactiae NOT DETECTED NOT DETECTED Final   Streptococcus pneumoniae NOT DETECTED NOT DETECTED Final   Streptococcus pyogenes NOT DETECTED NOT DETECTED Final   Acinetobacter baumannii NOT DETECTED NOT DETECTED Final   Enterobacteriaceae species NOT DETECTED NOT DETECTED Final   Enterobacter cloacae  complex NOT DETECTED NOT DETECTED Final   Escherichia coli NOT DETECTED NOT DETECTED Final   Klebsiella oxytoca NOT DETECTED NOT DETECTED Final   Klebsiella pneumoniae NOT DETECTED NOT DETECTED Final   Proteus species NOT DETECTED NOT DETECTED Final   Serratia marcescens NOT DETECTED NOT DETECTED Final   Haemophilus influenzae NOT DETECTED NOT DETECTED Final   Neisseria meningitidis NOT DETECTED NOT DETECTED Final   Pseudomonas aeruginosa NOT DETECTED NOT DETECTED Final   Candida albicans NOT DETECTED NOT DETECTED Final   Candida glabrata NOT DETECTED NOT DETECTED Final   Candida krusei NOT DETECTED NOT DETECTED Final   Candida parapsilosis NOT DETECTED NOT DETECTED Final   Candida tropicalis NOT DETECTED NOT DETECTED Final  Urine culture     Status: None   Collection Time: 06/22/16  5:39 PM  Result Value Ref Range Status   Specimen Description URINE, RANDOM  Final   Special Requests NONE  Final   Culture NO GROWTH  Final   Report Status 06/24/2016 FINAL  Final    Impression/Plan:  1. bacteremia - will repeat blood cultures  Tomorrow.  On ceftriaxone with intermediate penicillin resistance.   2.  Prosthetic valves - mitral and aortic.  In the setting of bacteremia, she will need a TEE to evaluate for prosthetic valve endocarditis.    3.  Back pain - about the same.  ? If discitis.

## 2016-06-25 LAB — CBC WITH DIFFERENTIAL/PLATELET
BASOS PCT: 0 %
Basophils Absolute: 0 10*3/uL (ref 0.0–0.1)
EOS ABS: 0.1 10*3/uL (ref 0.0–0.7)
Eosinophils Relative: 1 %
HCT: 30.1 % — ABNORMAL LOW (ref 36.0–46.0)
HEMOGLOBIN: 10 g/dL — AB (ref 12.0–15.0)
Lymphocytes Relative: 12 %
Lymphs Abs: 0.9 10*3/uL (ref 0.7–4.0)
MCH: 26.6 pg (ref 26.0–34.0)
MCHC: 33.2 g/dL (ref 30.0–36.0)
MCV: 80.1 fL (ref 78.0–100.0)
MONO ABS: 0.6 10*3/uL (ref 0.1–1.0)
MONOS PCT: 9 %
NEUTROS PCT: 78 %
Neutro Abs: 5.6 10*3/uL (ref 1.7–7.7)
Platelets: 215 10*3/uL (ref 150–400)
RBC: 3.76 MIL/uL — ABNORMAL LOW (ref 3.87–5.11)
RDW: 14.1 % (ref 11.5–15.5)
WBC: 7.2 10*3/uL (ref 4.0–10.5)

## 2016-06-25 LAB — BASIC METABOLIC PANEL
Anion gap: 8 (ref 5–15)
BUN: 10 mg/dL (ref 6–20)
CALCIUM: 8.9 mg/dL (ref 8.9–10.3)
CO2: 21 mmol/L — ABNORMAL LOW (ref 22–32)
CREATININE: 1.02 mg/dL — AB (ref 0.44–1.00)
Chloride: 108 mmol/L (ref 101–111)
GFR, EST AFRICAN AMERICAN: 59 mL/min — AB (ref >60)
GFR, EST NON AFRICAN AMERICAN: 51 mL/min — AB (ref >60)
Glucose, Bld: 120 mg/dL — ABNORMAL HIGH (ref 65–99)
Potassium: 3.8 mmol/L (ref 3.5–5.1)
SODIUM: 137 mmol/L (ref 135–145)

## 2016-06-25 LAB — CULTURE, BLOOD (ROUTINE X 2)

## 2016-06-25 LAB — PROTIME-INR
INR: 2.14
PROTHROMBIN TIME: 24.2 s — AB (ref 11.4–15.2)

## 2016-06-25 MED ORDER — WARFARIN SODIUM 2 MG PO TABS
2.0000 mg | ORAL_TABLET | Freq: Once | ORAL | Status: AC
  Administered 2016-06-25: 2 mg via ORAL
  Filled 2016-06-25: qty 1

## 2016-06-25 NOTE — Progress Notes (Signed)
[facility] has been requested to perform a transesophageal echocardiogram on 06/26/16 for bacteremia .  After careful review of history and examination, the risks and benefits of transesophageal echocardiogram have been explained including risks of esophageal damage, perforation (1:10,000 risk), bleeding, pharyngeal hematoma as well as other potential complications associated with conscious sedation including aspiration, arrhythmia, respiratory failure and death. Alternatives to treatment were discussed, questions were answered. Patient is willing to proceed.  TEE - Dr. Johnsie Cancel at 15:00 NPO after midnight. Meds with sips ok   Arbutus Leas, NP 06/25/2016 5:22 PM

## 2016-06-25 NOTE — Progress Notes (Signed)
ANTICOAGULATION CONSULT NOTE - Follow Up Consult  Pharmacy Consult for Coumadin Indication: atrial fibrillation  Allergies  Allergen Reactions  . Hydralazine Hcl Other (See Comments)    Headache     Patient Measurements: Height: 4' 11.06" (150 cm) Weight: 158 lb 8 oz (71.9 kg) IBW/kg (Calculated) : 43.33  Vital Signs: Temp: 98.1 F (36.7 C) (01/30 0443) Temp Source: Oral (01/30 0443) BP: 142/93 (01/30 1026) Pulse Rate: 87 (01/30 0443)  Labs:  Recent Labs  06/23/16 0434 06/24/16 0554 06/25/16 0537 06/25/16 0921  HGB 10.2* 9.3*  --  10.0*  HCT 29.9* 28.3*  --  30.1*  PLT 206 199  --  215  LABPROT 34.5* 29.8* 24.2*  --   INR 3.32 2.76 2.14  --   CREATININE 1.15* 1.07*  --  1.02*    Estimated Creatinine Clearance: 38 mL/min (by C-G formula based on SCr of 1.02 mg/dL (H)).   Medications:  Scheduled:  . atorvastatin  10 mg Oral q1800  . bisoprolol  2.5 mg Oral Daily  . cefTRIAXone (ROCEPHIN)  IV  2 g Intravenous Q24H  . furosemide  5 mg Oral Daily  . ramipril  5 mg Oral Daily  . sodium chloride  1,000 mL Intravenous Once  . sodium chloride flush  3 mL Intravenous Q12H  . Warfarin - Pharmacist Dosing Inpatient   Does not apply q1800    Assessment: 83yo female with AFib, admitted with supratherapeutic INR & last dose on 1/26.  Hg stable, pltc wnl.  INR decreased after held doses 1/27-28.  No bleeding noted.   Goal of Therapy:  INR 2-3 Monitor platelets by anticoagulation protocol: Yes   Plan:  Coumadin 2mg  today Continue daily INR  Gracy Bruins, PharmD Clinical Pharmacist [facility]

## 2016-06-25 NOTE — Progress Notes (Signed)
PROGRESS NOTE    Alexis [name]  CWC:[id number] DOB: 1933-08-26 DOA: 06/22/2016 PCP: [name], MD    Brief Narrative:  Patient is a 83 year old female history of hypertension, coronary artery disease status post CABG in 2004, CVA, atrial fibrillation on chronic Coumadin presenting to the ED with positive blood cultures from PCPs office. Blood cultures positive for Streptococcus viridans. 2-D echo obtained negative for vegetations however patient does have bioprosthetic valves and as such TEE has been recommended and ordered and patient to undergo TEE 06/26/2016. Patient on IV Rocephin. ID following.   Assessment & Plan:   Principal Problem:   Bacteremia due to Streptococcus Active Problems:   Essential hypertension   Coronary atherosclerosis   Congestive heart failure (HCC)   Long term current use of anticoagulant therapy   Eczema   Positive blood cultures   Atrial fibrillation (HCC)  #1 Streptococcus viridans bacteremia Patient was sent to the ED from PCPs office due to positive blood cultures. Patient was noted to be having low-grade fevers, chills, generalized weakness or fatigue. Cultures drawn at PCPs office from 06/20/2016 was positive for Streptococcus viridans. Repeat blood cultures preliminary results positive for Streptococcus. 2-D echo with EF of 55-60%, mild LVH, right ventricle poorly visualized probably mildly dilated and mildly hypokinetic, bioprosthetic aortic valve appeared to function normally no vegetation noted. Bioprosthetic mitral valve appears to have at least mild prostatic valve stenosis. Cannot rule out vegetation. TEE has been recommended by ID and ordered with cardiology to be done 06/26/2016 to rule out endocarditis as patient does have prosthetic valves.  IV antibiotics have been narrowed down to IV Rocephin. Repeat blood cultures ordered today 06/25/3016 and pending.   ID following and appreciate input and recommendations.   #2 hypertension Blood  pressure improved. Will saline lock IV fluids.   #3 congestive heart failure/ CAD Patient is euvolemic on examination with no signs of volume overload. Blood pressure improved with hydration. Saline lock IV fluids. Continue bisoprolol, Lipitor, Lasix.  #4 atrial fibrillation CHA2DS2VASC score 6 Continue bisoprolol for rate control. INR therapeutic. Coumadin per pharmacy.    DVT prophylaxis: INR therapeutic. On Coumadin.  Code Status: Full Family Communication: Updated patient and daughter at bedside. Daughter translated for patient. Disposition Plan: Home when medically stable and per recommendations of infectious diseases.   Consultants:   Infectious diseases: Dr. Baxter Flattery 06/23/2016  Procedures:   2-D echo 06/24/2016  Antimicrobials:   IV Rocephin 06/23/2016  IV vancomycin 06/22/2016>>>> 06/23/2016   Subjective: Patient sitting up in bed. No shortness of breath. No chest pain. Weakness improving.  Objective: Vitals:   06/24/16 1408 06/25/16 0443 06/25/16 1026 06/25/16 1543  BP: 113/68 137/78 (!) 142/93 (!) 156/77  Pulse: 80 87  78  Resp: 18 18  16   Temp: 98.7 F (37.1 C) 98.1 F (36.7 C)  98 F (36.7 C)  TempSrc: Oral Oral  Oral  SpO2: 95% 92%  96%  Weight:  71.9 kg (158 lb 8 oz)    Height:        Intake/Output Summary (Last 24 hours) at 06/25/16 1821 Last data filed at 06/25/16 0643  Gross per 24 hour  Intake               50 ml  Output                0 ml  Net               50 ml   Filed Weights   06/23/16  0998 06/24/16 0500 06/25/16 0443  Weight: 72.4 kg (159 lb 9.8 oz) 72.1 kg (158 lb 15.2 oz) 71.9 kg (158 lb 8 oz)    Examination:  General exam: Appears calm and comfortable  Respiratory system: Clear to auscultation. Respiratory effort normal. Cardiovascular system: S1 & S2 heard, RRR. No JVD, murmurs, rubs, gallops or clicks. No pedal edema. Gastrointestinal system: Abdomen is nondistended, soft and nontender. No organomegaly or masses felt.  Normal bowel sounds heard. Central nervous system: Alert and oriented. No focal neurological deficits. Extremities: Symmetric 5 x 5 power. Skin: No rashes, lesions or ulcers Psychiatry: Judgement and insight appear normal. Mood & affect appropriate.     Data Reviewed: I have personally reviewed following labs and imaging studies  CBC:  Recent Labs Lab 06/20/16 1637 06/22/16 1221 06/23/16 0434 06/24/16 0554 06/25/16 0921  WBC 8.1 10.4 7.7 6.9 7.2  NEUTROABS 6.5 8.4*  --   --  5.6  HGB 11.1* 11.0* 10.2* 9.3* 10.0*  HCT 32.5* 32.8* 29.9* 28.3* 30.1*  MCV 80.8 81.0 80.2 80.4 80.1  PLT 231.0 233 206 199 338   Basic Metabolic Panel:  Recent Labs Lab 06/22/16 1221 06/23/16 0434 06/24/16 0554 06/25/16 0921  NA 136 135 138 137  K 4.3 4.1 3.8 3.8  CL 106 107 107 108  CO2 21* 20* 20* 21*  GLUCOSE 157* 108* 103* 120*  BUN 21* 15 15 10   CREATININE 1.48* 1.15* 1.07* 1.02*  CALCIUM 9.3 8.8* 8.7* 8.9   GFR: Estimated Creatinine Clearance: 38 mL/min (by C-G formula based on SCr of 1.02 mg/dL (H)). Liver Function Tests:  Recent Labs Lab 06/20/16 1637 06/22/16 1221 06/23/16 0434  AST 25 29 26   ALT 24 22 20   ALKPHOS 77 79 72  BILITOT 0.7 0.5 0.7  PROT 7.5 7.3 6.8  ALBUMIN 3.4* 3.0* 2.7*   No results for input(s): LIPASE, AMYLASE in the last 168 hours. No results for input(s): AMMONIA in the last 168 hours. Coagulation Profile:  Recent Labs Lab 06/22/16 1614 06/23/16 0434 06/24/16 0554 06/25/16 0537  INR 3.92 3.32 2.76 2.14   Cardiac Enzymes: No results for input(s): CKTOTAL, CKMB, CKMBINDEX, TROPONINI in the last 168 hours. BNP (last 3 results) No results for input(s): PROBNP in the last 8760 hours. HbA1C: No results for input(s): HGBA1C in the last 72 hours. CBG: No results for input(s): GLUCAP in the last 168 hours. Lipid Profile: No results for input(s): CHOL, HDL, LDLCALC, TRIG, CHOLHDL, LDLDIRECT in the last 72 hours. Thyroid Function Tests: No  results for input(s): TSH, T4TOTAL, FREET4, T3FREE, THYROIDAB in the last 72 hours. Anemia Panel: No results for input(s): VITAMINB12, FOLATE, FERRITIN, TIBC, IRON, RETICCTPCT in the last 72 hours. Sepsis Labs:  Recent Labs Lab 06/22/16 1247  LATICACIDVEN 1.70    Recent Results (from the past 240 hour(s))  Blood culture (routine single)     Status: None   Collection Time: 06/20/16  4:37 PM  Result Value Ref Range Status   Culture VIRIDANS STREPTOCOCCUS  Final    Comment: Culture results may be compromised due to an excessive volume of blood received in culture bottles. Gram Stain Report Called to,Read Back By and Verified With: STACI VOICEMAIL DR PLOTNIKOV OFFICE 06-21-16 1140    Organism ID, Bacteria VIRIDANS STREPTOCOCCUS  Final      Susceptibility   Viridans streptococcus -  (no method available)    PENICILLIN 0.094 Intermediate     CEFTRIAXONE 0.047 Sensitive     LEVOFLOXACIN 0.75 Sensitive  Blood culture (routine x 2)     Status: Abnormal   Collection Time: 06/22/16  1:16 PM  Result Value Ref Range Status   Specimen Description BLOOD RIGHT ANTECUBITAL  Final   Special Requests BOTTLES DRAWN AEROBIC ONLY 6CC  Final   Culture  Setup Time   Final    AEROBIC BOTTLE ONLY GRAM POSITIVE COCCI IN CHAINS CRITICAL RESULT CALLED TO, READ BACK BY AND VERIFIED WITH: TO LBAJBUS(PHARMD) BY TCLEVELAND 06/23/2016 AT 5:24AM    Culture (A)  Final    VIRIDANS STREPTOCOCCUS SUSCEPTIBILITIES PERFORMED ON PREVIOUS CULTURE WITHIN THE LAST 5 DAYS.    Report Status 06/25/2016 FINAL  Final  Blood culture (routine x 2)     Status: Abnormal   Collection Time: 06/22/16  1:22 PM  Result Value Ref Range Status   Specimen Description BLOOD RIGHT HAND  Final   Special Requests BOTTLES DRAWN AEROBIC AND ANAEROBIC 5CC  Final   Culture  Setup Time   Final    IN BOTH AEROBIC AND ANAEROBIC BOTTLES GRAM POSITIVE COCCI IN CHAINS CRITICAL RESULT CALLED TO, READ BACK BY AND VERIFIED WITH: TO   LBAJBUS(PHARMD) BY TCLEVELAND 06/23/2016 AT 5:24AM    Culture VIRIDANS STREPTOCOCCUS (A)  Final   Report Status 06/25/2016 FINAL  Final   Organism ID, Bacteria VIRIDANS STREPTOCOCCUS  Final      Susceptibility   Viridans streptococcus - MIC*    PENICILLIN Value in next row Intermediate      INTERMEDIATE0.25    CEFTRIAXONE Value in next row Sensitive      SENSITIVE<=0.12    ERYTHROMYCIN Value in next row Sensitive      SENSITIVE<=0.12    LEVOFLOXACIN Value in next row Sensitive      SENSITIVE<=0.12    VANCOMYCIN Value in next row Sensitive      SENSITIVE<=0.12    * VIRIDANS STREPTOCOCCUS  Blood Culture ID Panel (Reflexed)     Status: Abnormal   Collection Time: 06/22/16  1:22 PM  Result Value Ref Range Status   Enterococcus species NOT DETECTED NOT DETECTED Final   Listeria monocytogenes NOT DETECTED NOT DETECTED Final   Staphylococcus species NOT DETECTED NOT DETECTED Final   Staphylococcus aureus NOT DETECTED NOT DETECTED Final   Streptococcus species DETECTED (A) NOT DETECTED Final    Comment: CRITICAL RESULT CALLED TO, READ BACK BY AND VERIFIED WITH: TO LBAJBUS(PHARMD) BY TCLEVELAND 06/23/2016 AT 5:24AM    Streptococcus agalactiae NOT DETECTED NOT DETECTED Final   Streptococcus pneumoniae NOT DETECTED NOT DETECTED Final   Streptococcus pyogenes NOT DETECTED NOT DETECTED Final   Acinetobacter baumannii NOT DETECTED NOT DETECTED Final   Enterobacteriaceae species NOT DETECTED NOT DETECTED Final   Enterobacter cloacae complex NOT DETECTED NOT DETECTED Final   Escherichia coli NOT DETECTED NOT DETECTED Final   Klebsiella oxytoca NOT DETECTED NOT DETECTED Final   Klebsiella pneumoniae NOT DETECTED NOT DETECTED Final   Proteus species NOT DETECTED NOT DETECTED Final   Serratia marcescens NOT DETECTED NOT DETECTED Final   Haemophilus influenzae NOT DETECTED NOT DETECTED Final   Neisseria meningitidis NOT DETECTED NOT DETECTED Final   Pseudomonas aeruginosa NOT DETECTED NOT  DETECTED Final   Candida albicans NOT DETECTED NOT DETECTED Final   Candida glabrata NOT DETECTED NOT DETECTED Final   Candida krusei NOT DETECTED NOT DETECTED Final   Candida parapsilosis NOT DETECTED NOT DETECTED Final   Candida tropicalis NOT DETECTED NOT DETECTED Final  Urine culture     Status: None   Collection Time:  06/22/16  5:39 PM  Result Value Ref Range Status   Specimen Description URINE, RANDOM  Final   Special Requests NONE  Final   Culture NO GROWTH  Final   Report Status 06/24/2016 FINAL  Final  Culture, blood (routine x 2)     Status: None (Preliminary result)   Collection Time: 06/25/16  5:35 AM  Result Value Ref Range Status   Specimen Description BLOOD LEFT ARM  Final   Special Requests BOTTLES DRAWN AEROBIC AND ANAEROBIC 5CC EACH  Final   Culture NO GROWTH < 12 HOURS  Final   Report Status PENDING  Incomplete  Culture, blood (routine x 2)     Status: None (Preliminary result)   Collection Time: 06/25/16  5:37 AM  Result Value Ref Range Status   Specimen Description BLOOD RIGHT ARM  Final   Special Requests BOTTLES DRAWN AEROBIC ONLY 5CC  Final   Culture NO GROWTH < 12 HOURS  Final   Report Status PENDING  Incomplete         Radiology Studies: No results found.      Scheduled Meds: . atorvastatin  10 mg Oral q1800  . bisoprolol  2.5 mg Oral Daily  . cefTRIAXone (ROCEPHIN)  IV  2 g Intravenous Q24H  . furosemide  5 mg Oral Daily  . ramipril  5 mg Oral Daily  . sodium chloride  1,000 mL Intravenous Once  . sodium chloride flush  3 mL Intravenous Q12H  . Warfarin - Pharmacist Dosing Inpatient   Does not apply q1800   Continuous Infusions:    LOS: 2 days    Time spent: 32 minutes    Shavonta Gossen, MD [facility] Pager [phone]  If 7PM-7AM, please contact night-coverage [email or web address] Password [facility] 06/25/2016, 6:21 PM

## 2016-06-26 ENCOUNTER — Inpatient Hospital Stay (HOSPITAL_COMMUNITY): Payer: Self-pay

## 2016-06-26 ENCOUNTER — Encounter (HOSPITAL_COMMUNITY)
Admission: EM | Disposition: A | Discharge status: Home Health | Payer: Self-pay | Source: Home / Self Care | Attending: Internal Medicine

## 2016-06-26 ENCOUNTER — Encounter (HOSPITAL_COMMUNITY): Payer: Self-pay | Admitting: *Deleted

## 2016-06-26 ENCOUNTER — Ambulatory Visit (HOSPITAL_COMMUNITY): Admit: 2016-06-26 | Payer: MEDICAID | Admitting: Cardiovascular Disease

## 2016-06-26 DIAGNOSIS — I480 Paroxysmal atrial fibrillation: Secondary | ICD-10-CM

## 2016-06-26 DIAGNOSIS — I10 Essential (primary) hypertension: Secondary | ICD-10-CM

## 2016-06-26 DIAGNOSIS — Z95828 Presence of other vascular implants and grafts: Secondary | ICD-10-CM

## 2016-06-26 DIAGNOSIS — T826XXA Infection and inflammatory reaction due to cardiac valve prosthesis, initial encounter: Secondary | ICD-10-CM

## 2016-06-26 DIAGNOSIS — I455 Other specified heart block: Secondary | ICD-10-CM

## 2016-06-26 DIAGNOSIS — N289 Disorder of kidney and ureter, unspecified: Secondary | ICD-10-CM

## 2016-06-26 DIAGNOSIS — Z951 Presence of aortocoronary bypass graft: Secondary | ICD-10-CM

## 2016-06-26 DIAGNOSIS — I330 Acute and subacute infective endocarditis: Principal | ICD-10-CM

## 2016-06-26 DIAGNOSIS — Z9889 Other specified postprocedural states: Secondary | ICD-10-CM

## 2016-06-26 DIAGNOSIS — B954 Other streptococcus as the cause of diseases classified elsewhere: Secondary | ICD-10-CM

## 2016-06-26 DIAGNOSIS — I361 Nonrheumatic tricuspid (valve) insufficiency: Secondary | ICD-10-CM

## 2016-06-26 DIAGNOSIS — Z7901 Long term (current) use of anticoagulants: Secondary | ICD-10-CM

## 2016-06-26 DIAGNOSIS — I509 Heart failure, unspecified: Secondary | ICD-10-CM

## 2016-06-26 DIAGNOSIS — I482 Chronic atrial fibrillation: Secondary | ICD-10-CM

## 2016-06-26 DIAGNOSIS — I2510 Atherosclerotic heart disease of native coronary artery without angina pectoris: Secondary | ICD-10-CM

## 2016-06-26 DIAGNOSIS — I5032 Chronic diastolic (congestive) heart failure: Secondary | ICD-10-CM

## 2016-06-26 DIAGNOSIS — Y712 Prosthetic and other implants, materials and accessory cardiovascular devices associated with adverse incidents: Secondary | ICD-10-CM

## 2016-06-26 HISTORY — PX: TEE WITHOUT CARDIOVERSION: SHX5443

## 2016-06-26 LAB — ECHO TEE
AOVTI: 43.3 cm
AV Mean grad: 13 mmHg
AVPG: 24 mmHg
AVPKVEL: 245 cm/s
CHL CUP TV REG PEAK VELOCITY: 379 cm/s
DOP CAL AO MEAN VELOCITY: 165 cm/s
MV Annulus VTI: 30.6 cm
MV M vel: 159
MVAP: 2.47 cm2
MVG: 12 mmHg
P 1/2 time: 89 ms
TRMAXVEL: 379 cm/s

## 2016-06-26 LAB — CBC
HEMATOCRIT: 28.8 % — AB (ref 36.0–46.0)
Hemoglobin: 9.7 g/dL — ABNORMAL LOW (ref 12.0–15.0)
MCH: 27.3 pg (ref 26.0–34.0)
MCHC: 33.7 g/dL (ref 30.0–36.0)
MCV: 81.1 fL (ref 78.0–100.0)
PLATELETS: 204 10*3/uL (ref 150–400)
RBC: 3.55 MIL/uL — ABNORMAL LOW (ref 3.87–5.11)
RDW: 14.4 % (ref 11.5–15.5)
WBC: 7.5 10*3/uL (ref 4.0–10.5)

## 2016-06-26 LAB — BASIC METABOLIC PANEL
ANION GAP: 10 (ref 5–15)
BUN: 10 mg/dL (ref 6–20)
CALCIUM: 8.7 mg/dL — AB (ref 8.9–10.3)
CO2: 22 mmol/L (ref 22–32)
Chloride: 104 mmol/L (ref 101–111)
Creatinine, Ser: 0.98 mg/dL (ref 0.44–1.00)
GFR calc Af Amer: >60 mL/min (ref >60)
GFR, EST NON AFRICAN AMERICAN: 53 mL/min — AB (ref >60)
GLUCOSE: 103 mg/dL — AB (ref 65–99)
POTASSIUM: 3.7 mmol/L (ref 3.5–5.1)
Sodium: 136 mmol/L (ref 135–145)

## 2016-06-26 LAB — PROTIME-INR
INR: 2.04
Prothrombin Time: 23.4 seconds — ABNORMAL HIGH (ref 11.4–15.2)

## 2016-06-26 LAB — MAGNESIUM: MAGNESIUM: 1.9 mg/dL (ref 1.7–2.4)

## 2016-06-26 SURGERY — ECHOCARDIOGRAM, TRANSESOPHAGEAL
Anesthesia: Moderate Sedation

## 2016-06-26 MED ORDER — MIDAZOLAM HCL 5 MG/ML IJ SOLN
INTRAMUSCULAR | Status: AC
  Filled 2016-06-26: qty 2

## 2016-06-26 MED ORDER — GENTAMICIN IN SALINE 1.2-0.9 MG/ML-% IV SOLN
60.0000 mg | INTRAVENOUS | Status: DC
  Administered 2016-06-27 – 2016-06-28 (×2): 60 mg via INTRAVENOUS
  Filled 2016-06-26 (×3): qty 50

## 2016-06-26 MED ORDER — FENTANYL CITRATE (PF) 100 MCG/2ML IJ SOLN
INTRAMUSCULAR | Status: AC
  Filled 2016-06-26: qty 2

## 2016-06-26 MED ORDER — FENTANYL CITRATE (PF) 100 MCG/2ML IJ SOLN
INTRAMUSCULAR | Status: DC | PRN
  Administered 2016-06-26 (×2): 25 ug via INTRAVENOUS

## 2016-06-26 MED ORDER — WARFARIN SODIUM 1 MG PO TABS
1.5000 mg | ORAL_TABLET | Freq: Once | ORAL | Status: AC
  Administered 2016-06-26: 1.5 mg via ORAL
  Filled 2016-06-26: qty 1

## 2016-06-26 MED ORDER — SODIUM CHLORIDE 0.9 % IV SOLN: INTRAVENOUS | Status: DC

## 2016-06-26 MED ORDER — MIDAZOLAM HCL 10 MG/2ML IJ SOLN
INTRAMUSCULAR | Status: DC | PRN
  Administered 2016-06-26 (×3): 2 mg via INTRAVENOUS

## 2016-06-26 MED ORDER — GENTAMICIN IN SALINE 1.6-0.9 MG/ML-% IV SOLN
80.0000 mg | Freq: Once | INTRAVENOUS | Status: AC
  Administered 2016-06-26: 80 mg via INTRAVENOUS
  Filled 2016-06-26: qty 50

## 2016-06-26 NOTE — Progress Notes (Addendum)
[facility]                                                                              Patient Demographics  Alexis [name], is a 83 y.o. female, DOB - [birth year], JYN:[id number]  Admit date - 06/22/2016   Admitting Physician Maren Reamer, MD  Outpatient Primary MD for the patient is [name], MD  Outpatient specialists:   LOS - 3  days    Chief Complaint  Patient presents with  . + blood cul, chills       Brief summary   Patient is a 83 year old female history of hypertension, coronary artery disease status post CABG in 2004, CVA, atrial fibrillation on chronic Coumadin presenting to the ED with positive blood cultures from PCPs office. Blood cultures positive for Streptococcus viridans. 2-D echo obtained negative for vegetations however patient does have bioprosthetic valves and as such TEE has been recommended and ordered and patient to undergo TEE 06/26/2016. Patient on IV Rocephin. ID following.    Assessment & Plan    Principal Problem: - Streptococcus viridans bacteremia With mitral valve endocarditis Patient was sent to the ED from PCPs office due to positive blood cultures. -  Patient was noted to be having low-grade fevers, chills, generalized weakness or fatigue. Cultures drawn at PCPs office from 06/20/2016 was positive for Streptococcus viridans. -  Repeat blood cultures preliminary results positive for Streptococcus.  - 2-D echo with EF of 55-60%, mild LVH, right ventricle poorly visualized probably mildly dilated and mildly hypokinetic, bioprosthetic aortic valve appeared to function normally no vegetation noted. Bioprosthetic mitral valve appears to have at least mild prostatic valve stenosis. Cannot rule out vegetation.  - TEE 1/31 showed small vegetations on the tissue mitral valve, and was okay with no abscess, trivial MR. Recommended treat for SBE with PICC line and antibiotics and repeat TEE after treatment - ID following,  recommended ceftriaxone and gentamicin, 6 weeks through March 12, will need cardiology follow-up prior to treatment completion, ID follow-up - Repeat blood cultures 1/30 negative so far, discussed with infectious disease, Dr [name], recommended blood cultures to be negative for 72 hours before PICC line to be placed, on Friday, 2/2   hypertension Blood pressure improved.   congestive heart failure/ CAD - Patient is euvolemic on examination with no signs of volume overload.  - Blood pressure improved with hydration.  - Continue bisoprolol, Lipitor, Lasix.  Atrial fibrillation -  CHA2DS2VASC score 6 - Continue bisoprolol for rate control. -  INR therapeutic. -  Coumadin per pharmacy.   Addendum 5:15pm Sinus pause, asymptomatic - called by RN, patient had 2.3sec pause, asymptomatic. Will get EKG, Mag level.  - Hold beta blocker  Addendum: Received call for another sinus pause 2.65 sec, discussed with Dr Ellyn Hack for cardiology consult.   Code Status: full  DVT Prophylaxis: coumadin  Family Communication: Discussed in detail with the patient, all imaging results, lab results explained to the patient and daughter    Disposition Plan: after PICC placed and cleared by ID    Time Spent in minutes  25 minutes  Procedures:  TEE   Consultants:  Cardiology  ID   Antimicrobials:   IV gentamicin to be started today 1/31  IV Rocephin   Medications  Scheduled Meds: . atorvastatin  10 mg Oral q1800  . bisoprolol  2.5 mg Oral Daily  . cefTRIAXone (ROCEPHIN)  IV  2 g Intravenous Q24H  . furosemide  5 mg Oral Daily  . ramipril  5 mg Oral Daily  . sodium chloride  1,000 mL Intravenous Once  . sodium chloride flush  3 mL Intravenous Q12H  . warfarin  1.5 mg Oral ONCE-1800  . Warfarin - Pharmacist Dosing Inpatient   Does not apply q1800   Continuous Infusions: PRN Meds:.acetaminophen **OR** acetaminophen, bisacodyl, HYDROcodone-acetaminophen, ketorolac, magnesium citrate,  ondansetron **OR** ondansetron (ZOFRAN) IV, senna-docusate, traZODone   Antibiotics   Anti-infectives    Start     Dose/Rate Route Frequency Ordered Stop   06/23/16 1400  vancomycin (VANCOCIN) IVPB 750 mg/150 ml premix  Status:  Discontinued     750 mg 150 mL/hr over 60 Minutes Intravenous Every 24 hours 06/22/16 1341 06/23/16 0559   06/23/16 0630  cefTRIAXone (ROCEPHIN) 2 g in dextrose 5 % 50 mL IVPB     2 g 100 mL/hr over 30 Minutes Intravenous Every 24 hours 06/23/16 0559     06/22/16 1315  vancomycin (VANCOCIN) 1,250 mg in sodium chloride 0.9 % 250 mL IVPB     1,250 mg 166.7 mL/hr over 90 Minutes Intravenous  Once 06/22/16 1312 06/22/16 1508        Subjective:   Alexis [name] was seen and examined today.  No specific complaints except was worried about her IV line. Patient denies dizziness, chest pain, shortness of breath, abdominal pain, N/V/D/C, new weakness, numbess, tingling. No acute events overnight.  Talk to the patient through her daughter who speaks [place].  Objective:   Vitals:   06/26/16 1100 06/26/16 1110 06/26/16 1111 06/26/16 1239  BP:   133/80 (!) 107/55  Pulse: 96 78 86   Resp: 19 (!) 32 (!) 26   Temp:      TempSrc:      SpO2: 93% 92% 95%   Weight:      Height:        Intake/Output Summary (Last 24 hours) at 06/26/16 1339 Last data filed at 06/26/16 1112  Gross per 24 hour  Intake              300 ml  Output                0 ml  Net              300 ml     Wt Readings from Last 3 Encounters:  06/26/16 68.7 kg (151 lb 6.4 oz)  06/06/16 72.6 kg (160 lb)  06/20/14 71.7 kg (158 lb)     Exam  General: Alert and oriented x 3, NAD  HEENT:    Neck: Supple, no JVD, no masses  Cardiovascular: S1 S2 auscultated, no rubs, murmurs or gallops. Regular rate and rhythm.  Respiratory: Clear to auscultation bilaterally, no wheezing, rales or rhonchi  Gastrointestinal: Soft, nontender, nondistended, + bowel sounds  Ext: no cyanosis clubbing  or edema  Neuro: AAOx3, Cr N's II- XII. Strength 5/5 upper and lower extremities bilaterally  Skin: No rashes  Psych: Normal affect and demeanor, alert and oriented x3    Data Reviewed:  I have personally reviewed following labs and imaging studies  Micro Results Recent Results (from the past 240 hour(s))  Blood  culture (routine single)     Status: None   Collection Time: 06/20/16  4:37 PM  Result Value Ref Range Status   Culture VIRIDANS STREPTOCOCCUS  Final    Comment: Culture results may be compromised due to an excessive volume of blood received in culture bottles. Gram Stain Report Called to,Read Back By and Verified With: STACI VOICEMAIL DR PLOTNI[facility]OV OFFICE 06-21-16 1140    Organism ID, Bacteria VIRIDANS STREPTOCOCCUS  Final      Susceptibility   Viridans streptococcus -  (no method available)    PENICILLIN 0.094 Intermediate     CEFTRIAXONE 0.047 Sensitive     LEVOFLOXACIN 0.75 Sensitive   Blood culture (routine x 2)     Status: Abnormal   Collection Time: 06/22/16  1:16 PM  Result Value Ref Range Status   Specimen Description BLOOD RIGHT ANTECUBITAL  Final   Special Requests BOTTLES DRAWN AEROBIC ONLY 6CC  Final   Culture  Setup Time   Final    AEROBIC BOTTLE ONLY GRAM POSITIVE COCCI IN CHAINS CRITICAL RESULT CALLED TO, READ BACK BY AND VERIFIED WITH: TO LBAJBUS(PHARMD) BY TCLEVELAND 06/23/2016 AT 5:24AM    Culture (A)  Final    VIRIDANS STREPTOCOCCUS SUSCEPTIBILITIES PERFORMED ON PREVIOUS CULTURE WITHIN THE LAST 5 DAYS.    Report Status 06/25/2016 FINAL  Final  Blood culture (routine x 2)     Status: Abnormal   Collection Time: 06/22/16  1:22 PM  Result Value Ref Range Status   Specimen Description BLOOD RIGHT HAND  Final   Special Requests BOTTLES DRAWN AEROBIC AND ANAEROBIC 5CC  Final   Culture  Setup Time   Final    IN BOTH AEROBIC AND ANAEROBIC BOTTLES GRAM POSITIVE COCCI IN CHAINS CRITICAL RESULT CALLED TO, READ BACK BY AND VERIFIED WITH: TO   LBAJBUS(PHARMD) BY TCLEVELAND 06/23/2016 AT 5:24AM    Culture VIRIDANS STREPTOCOCCUS (A)  Final   Report Status 06/25/2016 FINAL  Final   Organism ID, Bacteria VIRIDANS STREPTOCOCCUS  Final      Susceptibility   Viridans streptococcus - MIC*    PENICILLIN Value in next row Intermediate      INTERMEDIATE0.25    CEFTRIAXONE Value in next row Sensitive      SENSITIVE<=0.12    ERYTHROMYCIN Value in next row Sensitive      SENSITIVE<=0.12    LEVOFLOXACIN Value in next row Sensitive      SENSITIVE<=0.12    VANCOMYCIN Value in next row Sensitive      SENSITIVE<=0.12    * VIRIDANS STREPTOCOCCUS  Blood Culture ID Panel (Reflexed)     Status: Abnormal   Collection Time: 06/22/16  1:22 PM  Result Value Ref Range Status   Enterococcus species NOT DETECTED NOT DETECTED Final   Listeria monocytogenes NOT DETECTED NOT DETECTED Final   Staphylococcus species NOT DETECTED NOT DETECTED Final   Staphylococcus aureus NOT DETECTED NOT DETECTED Final   Streptococcus species DETECTED (A) NOT DETECTED Final    Comment: CRITICAL RESULT CALLED TO, READ BACK BY AND VERIFIED WITH: TO LBAJBUS(PHARMD) BY TCLEVELAND 06/23/2016 AT 5:24AM    Streptococcus agalactiae NOT DETECTED NOT DETECTED Final   Streptococcus pneumoniae NOT DETECTED NOT DETECTED Final   Streptococcus pyogenes NOT DETECTED NOT DETECTED Final   Acinetobacter baumannii NOT DETECTED NOT DETECTED Final   Enterobacteriaceae species NOT DETECTED NOT DETECTED Final   Enterobacter cloacae complex NOT DETECTED NOT DETECTED Final   Escherichia coli NOT DETECTED NOT DETECTED Final   Klebsiella oxytoca NOT DETECTED NOT DETECTED  Final   Klebsiella pneumoniae NOT DETECTED NOT DETECTED Final   Proteus species NOT DETECTED NOT DETECTED Final   Serratia marcescens NOT DETECTED NOT DETECTED Final   Haemophilus influenzae NOT DETECTED NOT DETECTED Final   Neisseria meningitidis NOT DETECTED NOT DETECTED Final   Pseudomonas aeruginosa NOT DETECTED NOT  DETECTED Final   Candida albicans NOT DETECTED NOT DETECTED Final   Candida glabrata NOT DETECTED NOT DETECTED Final   Candida krusei NOT DETECTED NOT DETECTED Final   Candida parapsilosis NOT DETECTED NOT DETECTED Final   Candida tropicalis NOT DETECTED NOT DETECTED Final  Urine culture     Status: None   Collection Time: 06/22/16  5:39 PM  Result Value Ref Range Status   Specimen Description URINE, RANDOM  Final   Special Requests NONE  Final   Culture NO GROWTH  Final   Report Status 06/24/2016 FINAL  Final  Culture, blood (routine x 2)     Status: None (Preliminary result)   Collection Time: 06/25/16  5:35 AM  Result Value Ref Range Status   Specimen Description BLOOD LEFT ARM  Final   Special Requests BOTTLES DRAWN AEROBIC AND ANAEROBIC 5CC EACH  Final   Culture NO GROWTH < 12 HOURS  Final   Report Status PENDING  Incomplete  Culture, blood (routine x 2)     Status: None (Preliminary result)   Collection Time: 06/25/16  5:37 AM  Result Value Ref Range Status   Specimen Description BLOOD RIGHT ARM  Final   Special Requests BOTTLES DRAWN AEROBIC ONLY 5CC  Final   Culture NO GROWTH < 12 HOURS  Final   Report Status PENDING  Incomplete    Radiology Reports No results found.  Lab Data:  CBC:  Recent Labs Lab 06/20/16 1637 06/22/16 1221 06/23/16 0434 06/24/16 0554 06/25/16 0921 06/26/16 0820  WBC 8.1 10.4 7.7 6.9 7.2 7.5  NEUTROABS 6.5 8.4*  --   --  5.6  --   HGB 11.1* 11.0* 10.2* 9.3* 10.0* 9.7*  HCT 32.5* 32.8* 29.9* 28.3* 30.1* 28.8*  MCV 80.8 81.0 80.2 80.4 80.1 81.1  PLT 231.0 233 206 199 215 962   Basic Metabolic Panel:  Recent Labs Lab 06/22/16 1221 06/23/16 0434 06/24/16 0554 06/25/16 0921 06/26/16 0820  NA 136 135 138 137 136  K 4.3 4.1 3.8 3.8 3.7  CL 106 107 107 108 104  CO2 21* 20* 20* 21* 22  GLUCOSE 157* 108* 103* 120* 103*  BUN 21* 15 15 10 10   CREATININE 1.48* 1.15* 1.07* 1.02* 0.98  CALCIUM 9.3 8.8* 8.7* 8.9 8.7*   GFR: Estimated  Creatinine Clearance: 38.7 mL/min (by C-G formula based on SCr of 0.98 mg/dL). Liver Function Tests:  Recent Labs Lab 06/20/16 1637 06/22/16 1221 06/23/16 0434  AST 25 29 26   ALT 24 22 20   ALKPHOS 77 79 72  BILITOT 0.7 0.5 0.7  PROT 7.5 7.3 6.8  ALBUMIN 3.4* 3.0* 2.7*   No results for input(s): LIPASE, AMYLASE in the last 168 hours. No results for input(s): AMMONIA in the last 168 hours. Coagulation Profile:  Recent Labs Lab 06/22/16 1614 06/23/16 0434 06/24/16 0554 06/25/16 0537 06/26/16 0820  INR 3.92 3.32 2.76 2.14 2.04   Cardiac Enzymes: No results for input(s): CKTOTAL, CKMB, CKMBINDEX, TROPONINI in the last 168 hours. BNP (last 3 results) No results for input(s): PROBNP in the last 8760 hours. HbA1C: No results for input(s): HGBA1C in the last 72 hours. CBG: No results for input(s): GLUCAP in  the last 168 hours. Lipid Profile: No results for input(s): CHOL, HDL, LDLCALC, TRIG, CHOLHDL, LDLDIRECT in the last 72 hours. Thyroid Function Tests: No results for input(s): TSH, T4TOTAL, FREET4, T3FREE, THYROIDAB in the last 72 hours. Anemia Panel: No results for input(s): VITAMINB12, FOLATE, FERRITIN, TIBC, IRON, RETICCTPCT in the last 72 hours. Urine analysis:    Component Value Date/Time   COLORURINE YELLOW 06/22/2016 1739   APPEARANCEUR CLEAR 06/22/2016 1739   LABSPEC 1.005 06/22/2016 1739   PHURINE 5.0 06/22/2016 1739   GLUCOSEU NEGATIVE 06/22/2016 1739   GLUCOSEU NEGATIVE 06/20/2016 1637   HGBUR NEGATIVE 06/22/2016 1739   BILIRUBINUR NEGATIVE 06/22/2016 1739   KETONESUR NEGATIVE 06/22/2016 1739   PROTEINUR NEGATIVE 06/22/2016 1739   UROBILINOGEN 1.0 06/20/2016 1637   NITRITE NEGATIVE 06/22/2016 1739   LEUKOCYTESUR NEGATIVE 06/22/2016 [place] M.D. [facility] 06/26/2016, 1:39 PM  Pager: [phone] Between 7am to 7pm - call Pager - [phone]  After 7pm go to [email or web address] - password [email or web address]  Call night coverage person covering  after 7pm

## 2016-06-26 NOTE — CV Procedure (Signed)
During this procedure the patient is administered a total of Versed 5 mg and Fentanyl 75 mg to achieve and maintain moderate conscious sedation.  The patient's heart rate, blood pressure, and oxygen saturation are monitored continuously during the procedure. The period of conscious sedation is 40 minutes, of which I was present face-to-face 100% of this time.  EF normal Normal tissue AV Small vegetation on tissue mitral valve. Annulus ok with no abscess. Trivial MR gradient across Valve but no MS by Pt1/2 Severe TR Severe LAE with smoke no LAA thrombus No ASD/PFO PV normal TV normal no vegetation Mild aortic debris  Would Rx for SBE with PIC line and antibiotics and repeat TEE after Rx  Jenkins Rouge

## 2016-06-26 NOTE — Progress Notes (Signed)
Notified at 1700 by CCMD that pt. Had 2.34 second pause at 1656 Md Notified

## 2016-06-26 NOTE — Progress Notes (Signed)
Pharmacy Antibiotic Note  Alexis [name] is a 83 y.o. female admitted on 06/22/2016 with prosthetic valve endocarditis.  Pharmacy has been consulted for synergistic dosing of Gentamicin.  Estimated t1/2 ~5hr and ABW 53kg.  Target pk ~3 and tr < 1  Plan: Continue Ceftriaxone 2g IV q24 Gentamicin 80mg  IV x 1, then 70mg  IV q24 Check steady Gent peak and trough Watch Cr and f/u cultures  Height: 4' 11.06" (150 cm) Weight: 151 lb 6.4 oz (68.7 kg) IBW/kg (Calculated) : 43.33  Temp (24hrs), Avg:98.2 F (36.8 C), Min:97.8 F (36.6 C), Max:98.6 F (37 C)   Recent Labs Lab 06/22/16 1221 06/22/16 1247 06/23/16 0434 06/24/16 0554 06/25/16 0921 06/26/16 0820  WBC 10.4  --  7.7 6.9 7.2 7.5  CREATININE 1.48*  --  1.15* 1.07* 1.02* 0.98  LATICACIDVEN  --  1.70  --   --   --   --     Estimated Creatinine Clearance: 38.7 mL/min (by C-G formula based on SCr of 0.98 mg/dL).    Allergies  Allergen Reactions  . Hydralazine Hcl Other (See Comments)    Headache     Antimicrobials this admission: Vanc 1/27 x1 CTX 1/28 >> Norva Karvonen 1/31 >>  Microbiology results: 1/25 BCx x1 - strep varidans (s-rocephin, lvq) 1/27 BCx x2 - 2/2 S.viridans 1/30 BCx x 2- ntd  Thank you for allowing pharmacy to be a part of this patient's care.   Gracy Bruins, PharmD Clinical Pharmacist [facility]

## 2016-06-26 NOTE — Progress Notes (Signed)
[facility]   Reason for visit: Follow up on bacteremia  Interval History: TEE done and is positive for mitral valve vegetation; feels well otherwise, no fever, no chills  No associated rash, n/v  Physical Exam: Constitutional:  Vitals:   06/26/16 1111 06/26/16 1239  BP: 133/80 (!) 107/55  Pulse: 86   Resp: (!) 26   Temp:     patient appears in NAD Eyes: anicteric Respiratory: Normal respiratory effort; CTA B Cardiovascular: RRR + murmur GI: soft, nt, nd MS: no Osler nodes  Review of Systems: Constitutional: negative for fevers and chills Respiratory: negative for cough Cardiovascular: negative for chest pressure/discomfort Gastrointestinal: negative for diarrhea Integument/breast: negative for rash  Lab Results  Component Value Date   WBC 7.5 06/26/2016   HGB 9.7 (L) 06/26/2016   HCT 28.8 (L) 06/26/2016   MCV 81.1 06/26/2016   PLT 204 06/26/2016    Lab Results  Component Value Date   CREATININE 0.98 06/26/2016   BUN 10 06/26/2016   NA 136 06/26/2016   K 3.7 06/26/2016   CL 104 06/26/2016   CO2 22 06/26/2016    Lab Results  Component Value Date   ALT 20 06/23/2016   AST 26 06/23/2016   ALKPHOS 72 06/23/2016     Microbiology: Recent Results (from the past 240 hour(s))  Blood culture (routine single)     Status: None   Collection Time: 06/20/16  4:37 PM  Result Value Ref Range Status   Culture VIRIDANS STREPTOCOCCUS  Final    Comment: Culture results may be compromised due to an excessive volume of blood received in culture bottles. Gram Stain Report Called to,Read Back By and Verified With: STACI VOICEMAIL DR PLOTNIKOV OFFICE 06-21-16 1140    Organism ID, Bacteria VIRIDANS STREPTOCOCCUS  Final      Susceptibility   Viridans streptococcus -  (no method available)    PENICILLIN 0.094 Intermediate     CEFTRIAXONE 0.047 Sensitive     LEVOFLOXACIN 0.75 Sensitive   Blood culture (routine x 2)     Status: Abnormal   Collection Time: 06/22/16  1:16 PM  Result Value Ref Range Status   Specimen Description BLOOD RIGHT ANTECUBITAL  Final   Special Requests BOTTLES DRAWN AEROBIC ONLY 6CC  Final   Culture  Setup Time   Final    AEROBIC BOTTLE ONLY GRAM POSITIVE COCCI IN CHAINS CRITICAL RESULT CALLED TO, READ BACK BY AND VERIFIED WITH: TO LBAJBUS(PHARMD) BY TCLEVELAND 06/23/2016 AT 5:24AM    Culture (A)  Final    VIRIDANS STREPTOCOCCUS SUSCEPTIBILITIES PERFORMED ON PREVIOUS CULTURE WITHIN THE LAST 5 DAYS.    Report Status 06/25/2016 FINAL  Final  Blood culture (routine x 2)     Status: Abnormal   Collection Time: 06/22/16  1:22 PM  Result Value Ref Range Status   Specimen Description BLOOD RIGHT HAND  Final   Special Requests BOTTLES DRAWN AEROBIC AND ANAEROBIC 5CC  Final   Culture  Setup Time   Final    IN BOTH AEROBIC AND ANAEROBIC BOTTLES GRAM POSITIVE COCCI IN CHAINS CRITICAL RESULT CALLED TO, READ BACK BY AND VERIFIED WITH: TO  LBAJBUS(PHARMD) BY TCLEVELAND 06/23/2016 AT 5:24AM    Culture VIRIDANS STREPTOCOCCUS (A)  Final   Report Status 06/25/2016 FINAL  Final   Organism ID, Bacteria VIRIDANS STREPTOCOCCUS  Final      Susceptibility   Viridans streptococcus - MIC*    PENICILLIN Value in next row Intermediate  INTERMEDIATE0.25    CEFTRIAXONE Value in next row Sensitive      SENSITIVE<=0.12    ERYTHROMYCIN Value in next row Sensitive      SENSITIVE<=0.12    LEVOFLOXACIN Value in next row Sensitive      SENSITIVE<=0.12    VANCOMYCIN Value in next row Sensitive      SENSITIVE<=0.12    * VIRIDANS STREPTOCOCCUS  Blood Culture ID Panel (Reflexed)     Status: Abnormal   Collection Time: 06/22/16  1:22 PM  Result Value Ref Range Status   Enterococcus species NOT DETECTED NOT DETECTED Final   Listeria monocytogenes NOT DETECTED NOT DETECTED Final   Staphylococcus species NOT DETECTED NOT DETECTED Final   Staphylococcus aureus NOT DETECTED NOT DETECTED Final   Streptococcus species DETECTED  (A) NOT DETECTED Final    Comment: CRITICAL RESULT CALLED TO, READ BACK BY AND VERIFIED WITH: TO LBAJBUS(PHARMD) BY TCLEVELAND 06/23/2016 AT 5:24AM    Streptococcus agalactiae NOT DETECTED NOT DETECTED Final   Streptococcus pneumoniae NOT DETECTED NOT DETECTED Final   Streptococcus pyogenes NOT DETECTED NOT DETECTED Final   Acinetobacter baumannii NOT DETECTED NOT DETECTED Final   Enterobacteriaceae species NOT DETECTED NOT DETECTED Final   Enterobacter cloacae complex NOT DETECTED NOT DETECTED Final   Escherichia coli NOT DETECTED NOT DETECTED Final   Klebsiella oxytoca NOT DETECTED NOT DETECTED Final   Klebsiella pneumoniae NOT DETECTED NOT DETECTED Final   Proteus species NOT DETECTED NOT DETECTED Final   Serratia marcescens NOT DETECTED NOT DETECTED Final   Haemophilus influenzae NOT DETECTED NOT DETECTED Final   Neisseria meningitidis NOT DETECTED NOT DETECTED Final   Pseudomonas aeruginosa NOT DETECTED NOT DETECTED Final   Candida albicans NOT DETECTED NOT DETECTED Final   Candida glabrata NOT DETECTED NOT DETECTED Final   Candida krusei NOT DETECTED NOT DETECTED Final   Candida parapsilosis NOT DETECTED NOT DETECTED Final   Candida tropicalis NOT DETECTED NOT DETECTED Final  Urine culture     Status: None   Collection Time: 06/22/16  5:39 PM  Result Value Ref Range Status   Specimen Description URINE, RANDOM  Final   Special Requests NONE  Final   Culture NO GROWTH  Final   Report Status 06/24/2016 FINAL  Final  Culture, blood (routine x 2)     Status: None (Preliminary result)   Collection Time: 06/25/16  5:35 AM  Result Value Ref Range Status   Specimen Description BLOOD LEFT ARM  Final   Special Requests BOTTLES DRAWN AEROBIC AND ANAEROBIC 5CC EACH  Final   Culture NO GROWTH < 12 HOURS  Final   Report Status PENDING  Incomplete  Culture, blood (routine x 2)     Status: None (Preliminary result)   Collection Time: 06/25/16  5:37 AM  Result Value Ref Range Status    Specimen Description BLOOD RIGHT ARM  Final   Special Requests BOTTLES DRAWN AEROBIC ONLY 5CC  Final   Culture NO GROWTH < 12 HOURS  Final   Report Status PENDING  Incomplete    Impression/Plan:  1. PVE of mitral valve - MIC of penicillin noted and is > 0.12.  Optimal treatment is ceftriaxone + gentamicin.  I will add gentamicin.  She will need both for 6 weeks through March 12.   I will order the OPAT consult.   Labs per home health Keep picc line in until seen by ID MD I will arrange follow up with ID clinic Will need cardiology follow up prior to treatment  completion  2.  Renal insufficiency - some baseline renal issues.  Will need close monitoring of creat at discharge.    3.  Medication monitoring - gentamicin levels per home health/pharmacy.    I will sign off, please call with any questions. thanks

## 2016-06-26 NOTE — Consult Note (Signed)
CARDIOLOGY CONSULT NOTE   Patient ID: Alexis [name] MRN: [id number], DOB/AGE: [birth year]   Admit date: 06/22/2016 Date of Consult: 06/26/2016   Primary Physician: [name], MD Primary Cardiologist: new (previous cardiologist in [facility])  Pt. Profile  Alexis [name] is 83 year old Caucasian female from [place] with past medical history of CVA, permanent atrial fibrillation on Coumadin, CAD s/p CABG in 2009, and also aortic valve replacement and mitral valve repair in 2009 presented with recurrent fever and found by PCP to have positive blood culture. TEE showed small vegetation on mitral valve recommended IV abx for 6 weeks. Cardiology consulted for afib with prolonged pauses up to 2.65 secs  Problem List  Past Medical History:  Diagnosis Date  . Atrial fibrillation ([place])   . CAD (coronary artery disease) of artery bypass graft   . CVA (cerebral vascular accident) [facility])     Past Surgical History:  Procedure Laterality Date  . CORONARY ARTERY BYPASS GRAFT    . MITRAL VALVE REPAIR (MV)/CORONARY ARTERY BYPASS GRAFTING (CABG)     2009 [facility] ([facility])  . TEE WITHOUT CARDIOVERSION N/A 06/26/2016   Procedure: TRANSESOPHAGEAL ECHOCARDIOGRAM (TEE);  Surgeon: Josue Hector, MD;  Location: [facility] ENDOSCOPY;  Service: Cardiovascular;  Laterality: N/A;     Allergies  Allergies  Allergen Reactions  . Hydralazine Hcl Other (See Comments)    Headache     HPI   Alexis [name] is 83 year old Caucasian female from [place] with past medical history of CVA, permanent atrial fibrillation on Coumadin, CAD s/p CABG in 2009, and also aortic valve replacement and mitral valve repair in 2009. According to the patient's daughter, she underwent both bypass surgery and mitral valve repair at [facility] in 2009. She has at this followed by a cardiologist at [facility] since that time, the last she was seen by her primary cardiologist was last year, since then her primary cardiologist  has moved away and that they have not established with another cardiologist. Also according to the daughter, she has had no permanent atrial fibrillation since even prior to the bypass surgery. She has been on Coumadin therapy since her stroke in 2002.   More recently, she has been having runs of fever large at home. She was seen by her PCP and blood culture came back positive for gram-positive cocci. He was admitted to [facility] for further workup. Infectious disease has since been consulted. Echocardiogram obtained on 06/24/2016 showed EF 55-60%, bioprosthetic aortic valve present, also bioprosthetic mitral valve present, severely dilated left atrium, mild-to-moderate TR, peak PA pressure 51 mmHg. She underwent TEE on 06/26/2016 which showed EF 55-60%, normal appearing tissue AVR, tissue MVR with small vegetation on the leaflet. The vegetation was felt to be small, therefore not highly embolic, recommended 6 weeks of IV antibiotic. While the patient was admitted, it was noted she occasionally have prolonged pauses. Normally she is in atrial fibrillation with heart rate in the 70s to 80s. She did have 2 pulses lasting 2.3 second and a 2.65 second. Cardiology has been consulted regarding recommendation of her atrial fibrillation with pauses. Her beta blocker since been discontinued.    Inpatient Medications  . atorvastatin  10 mg Oral q1800  . cefTRIAXone (ROCEPHIN)  IV  2 g Intravenous Q24H  . furosemide  5 mg Oral Daily  . [START ON 06/27/2016] gentamicin  60 mg Intravenous Q24H  . ramipril  5 mg Oral Daily  . sodium chloride  1,000 mL Intravenous Once  . sodium chloride flush  3 mL Intravenous Q12H  . Warfarin - Pharmacist Dosing Inpatient   Does not apply q1800    Family History Family History  Problem Relation Age of Onset  . Stroke Maternal Grandmother   . CAD Neg Hx      Social History Social History   Social History  . Marital status: Married    Spouse name: N/A  . Number  of children: N/A  . Years of education: N/A   Occupational History  . Not on file.   Social History Main Topics  . Smoking status: Never Smoker  . Smokeless tobacco: Never Used  . Alcohol use No  . Drug use: No  . Sexual activity: Not on file   Other Topics Concern  . Not on file   Social History Narrative  . No narrative on file     Review of Systems  General:  No night sweats or weight changes.  + chills, fever Cardiovascular:  No chest pain, dyspnea on exertion, edema, orthopnea, palpitations, paroxysmal nocturnal dyspnea. Dermatological: No rash, lesions/masses Respiratory: No cough, dyspnea Urologic: No hematuria, dysuria Abdominal:   No nausea, vomiting, diarrhea, bright red blood per rectum, melena, or hematemesis Neurologic:  No visual changes, wkns, changes in mental status. All other systems reviewed and are otherwise negative except as noted above.  Physical Exam  Blood pressure (!) 107/55, pulse 86, temperature 98.3 F (36.8 C), temperature source Oral, resp. rate (!) 26, height 4' 11.06" (1.5 m), weight 151 lb 6.4 oz (68.7 kg), SpO2 95 %.  General: Pleasant, NAD Psych: Normal affect. Neuro: Alert and oriented X 3. Moves all extremities spontaneously. HEENT: Normal  Neck: Supple without bruits or JVD. Lungs:  Resp regular and unlabored, CTA. Heart: irregularly irregular. no s3, s4, or murmurs. Abdomen: Soft, non-tender, non-distended, BS + x 4.  Extremities: No clubbing, cyanosis or edema. DP/PT/Radials 2+ and equal bilaterally.  Labs  No results for input(s): CKTOTAL, CKMB, TROPONINI in the last 72 hours. Lab Results  Component Value Date   WBC 7.5 06/26/2016   HGB 9.7 (L) 06/26/2016   HCT 28.8 (L) 06/26/2016   MCV 81.1 06/26/2016   PLT 204 06/26/2016    Recent Labs Lab 06/23/16 0434  06/26/16 0820  NA 135  < > 136  K 4.1  < > 3.7  CL 107  < > 104  CO2 20*  < > 22  BUN 15  < > 10  CREATININE 1.15*  < > 0.98  CALCIUM 8.8*  < > 8.7*  PROT  6.8  --   --   BILITOT 0.7  --   --   ALKPHOS 72  --   --   ALT 20  --   --   AST 26  --   --   GLUCOSE 108*  < > 103*  < > = values in this interval not displayed. No results found for: CHOL, HDL, LDLCALC, TRIG No results found for: DDIMER  Radiology/Studies  No results found.  ECG  Persistent atrial fibrillation without significant ST-T wave changes   ASSESSMENT AND PLAN  1. Permanent atrial fibrillation with prolonged pauses: She had 2 episodes of pauses. One was 2.3 second the other one was 2.6 seconds. On further discussion with the patient, she has had previous episode of disease. Also as well. Since she is on the lowest possible dose of by bisoprolol, we do agree the safest option is to discontinue the beta blocker and to see her heart rate response  afterward.  - This patients CHA2DS2-VASc Score and unadjusted Ischemic Stroke Rate (% per year) is equal to 9.7 % stroke rate/year from a score of 6  Above score calculated as 1 point each if present [CHF, HTN, DM, Vascular=MI/PAD/Aortic Plaque, Age if 65-74, or Female] Above score calculated as 2 points each if present [Age > 75, or Stroke/TIA/TE]   2. 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 Confirmed on TEE. Likely need 6 weeks of IV antibiotic due to the small size.  3. CAD s/p CABG in 2009: No obvious angina  4. S/p aortic valve replacement  5. S/p mitral valve repair   Signed, Almyra Deforest, PA-C 06/26/2016, 9:04 PM   The patient was seen and examined, and I agree with the history, physical exam, assessment and plan as documented by H. Eulah Citizen.  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  Found to have asymptomatic 2.3 and 2.65 second pauses with underlying atrial fibrillation while on bisoprolol 2.5 mg daily for rate control, which has subsequently been held.  Has a h/o dizziness for the past 5 years with near syncope. Denies exertional chest pain. Has chronic exertional dyspnea since childhood, worse in the past several years.  For the time being would continue to hold beta blocker and allow to wash out completely, while monitoring heart rate to see what strategy to adopt for rate control.

## 2016-06-26 NOTE — Progress Notes (Signed)
notified by CCMD at 1755  pt had another pause of 2.65 seconds MD notified and EKGhas been done

## 2016-06-26 NOTE — Interval H&P Note (Signed)
History and Physical Interval Note:  06/26/2016 10:16 AM  Alexis [name]  has presented today for surgery, with the diagnosis of bacteremia  The various methods of treatment have been discussed with the patient and family. After consideration of risks, benefits and other options for treatment, the patient has consented to  Procedure(s): TRANSESOPHAGEAL ECHOCARDIOGRAM (TEE) (N/A) as a surgical intervention .  The patient's history has been reviewed, patient examined, no change in status, stable for surgery.  I have reviewed the patient's chart and labs.  Questions were answered to the patient's satisfaction.     Jenkins Rouge

## 2016-06-26 NOTE — H&P (View-Only) (Signed)
PROGRESS NOTE    Alexis [name]  SLH:[id number] DOB: Feb 11, 1934 DOA: 06/22/2016 PCP: [name], MD    Brief Narrative:  Patient is a 83 year old female history of hypertension, coronary artery disease status post CABG in 2004, CVA, atrial fibrillation on chronic Coumadin presenting to the ED with positive blood cultures from PCPs office. Blood cultures positive for Streptococcus viridans. 2-D echo obtained negative for vegetations however patient does have bioprosthetic valves and as such TEE has been recommended and ordered and patient to undergo TEE 06/26/2016. Patient on IV Rocephin. ID following.   Assessment & Plan:   Principal Problem:   Bacteremia due to Streptococcus Active Problems:   Essential hypertension   Coronary atherosclerosis   Congestive heart failure (HCC)   Long term current use of anticoagulant therapy   Eczema   Positive blood cultures   Atrial fibrillation (HCC)  #1 Streptococcus viridans bacteremia Patient was sent to the ED from PCPs office due to positive blood cultures. Patient was noted to be having low-grade fevers, chills, generalized weakness or fatigue. Cultures drawn at PCPs office from 06/20/2016 was positive for Streptococcus viridans. Repeat blood cultures preliminary results positive for Streptococcus. 2-D echo with EF of 55-60%, mild LVH, right ventricle poorly visualized probably mildly dilated and mildly hypokinetic, bioprosthetic aortic valve appeared to function normally no vegetation noted. Bioprosthetic mitral valve appears to have at least mild prostatic valve stenosis. Cannot rule out vegetation. TEE has been recommended by ID and ordered with cardiology to be done 06/26/2016 to rule out endocarditis as patient does have prosthetic valves.  IV antibiotics have been narrowed down to IV Rocephin. Repeat blood cultures ordered today 06/25/3016 and pending.   ID following and appreciate input and recommendations.   #2 hypertension Blood  pressure improved. Will saline lock IV fluids.   #3 congestive heart failure/ CAD Patient is euvolemic on examination with no signs of volume overload. Blood pressure improved with hydration. Saline lock IV fluids. Continue bisoprolol, Lipitor, Lasix.  #4 atrial fibrillation CHA2DS2VASC score 6 Continue bisoprolol for rate control. INR therapeutic. Coumadin per pharmacy.    DVT prophylaxis: INR therapeutic. On Coumadin.  Code Status: Full Family Communication: Updated patient and daughter at bedside. Daughter translated for patient. Disposition Plan: Home when medically stable and per recommendations of infectious diseases.   Consultants:   Infectious diseases: Dr. Baxter Flattery 06/23/2016  Procedures:   2-D echo 06/24/2016  Antimicrobials:   IV Rocephin 06/23/2016  IV vancomycin 06/22/2016>>>> 06/23/2016   Subjective: Patient sitting up in bed. No shortness of breath. No chest pain. Weakness improving.  Objective: Vitals:   06/24/16 1408 06/25/16 0443 06/25/16 1026 06/25/16 1543  BP: 113/68 137/78 (!) 142/93 (!) 156/77  Pulse: 80 87  78  Resp: 18 18  16   Temp: 98.7 F (37.1 C) 98.1 F (36.7 C)  98 F (36.7 C)  TempSrc: Oral Oral  Oral  SpO2: 95% 92%  96%  Weight:  71.9 kg (158 lb 8 oz)    Height:        Intake/Output Summary (Last 24 hours) at 06/25/16 1821 Last data filed at 06/25/16 0643  Gross per 24 hour  Intake               50 ml  Output                0 ml  Net               50 ml   Filed Weights   06/23/16  1478 06/24/16 0500 06/25/16 0443  Weight: 72.4 kg (159 lb 9.8 oz) 72.1 kg (158 lb 15.2 oz) 71.9 kg (158 lb 8 oz)    Examination:  General exam: Appears calm and comfortable  Respiratory system: Clear to auscultation. Respiratory effort normal. Cardiovascular system: S1 & S2 heard, RRR. No JVD, murmurs, rubs, gallops or clicks. No pedal edema. Gastrointestinal system: Abdomen is nondistended, soft and nontender. No organomegaly or masses felt.  Normal bowel sounds heard. Central nervous system: Alert and oriented. No focal neurological deficits. Extremities: Symmetric 5 x 5 power. Skin: No rashes, lesions or ulcers Psychiatry: Judgement and insight appear normal. Mood & affect appropriate.     Data Reviewed: I have personally reviewed following labs and imaging studies  CBC:  Recent Labs Lab 06/20/16 1637 06/22/16 1221 06/23/16 0434 06/24/16 0554 06/25/16 0921  WBC 8.1 10.4 7.7 6.9 7.2  NEUTROABS 6.5 8.4*  --   --  5.6  HGB 11.1* 11.0* 10.2* 9.3* 10.0*  HCT 32.5* 32.8* 29.9* 28.3* 30.1*  MCV 80.8 81.0 80.2 80.4 80.1  PLT 231.0 233 206 199 295   Basic Metabolic Panel:  Recent Labs Lab 06/22/16 1221 06/23/16 0434 06/24/16 0554 06/25/16 0921  NA 136 135 138 137  K 4.3 4.1 3.8 3.8  CL 106 107 107 108  CO2 21* 20* 20* 21*  GLUCOSE 157* 108* 103* 120*  BUN 21* 15 15 10   CREATININE 1.48* 1.15* 1.07* 1.02*  CALCIUM 9.3 8.8* 8.7* 8.9   GFR: Estimated Creatinine Clearance: 38 mL/min (by C-G formula based on SCr of 1.02 mg/dL (H)). Liver Function Tests:  Recent Labs Lab 06/20/16 1637 06/22/16 1221 06/23/16 0434  AST 25 29 26   ALT 24 22 20   ALKPHOS 77 79 72  BILITOT 0.7 0.5 0.7  PROT 7.5 7.3 6.8  ALBUMIN 3.4* 3.0* 2.7*   No results for input(s): LIPASE, AMYLASE in the last 168 hours. No results for input(s): AMMONIA in the last 168 hours. Coagulation Profile:  Recent Labs Lab 06/22/16 1614 06/23/16 0434 06/24/16 0554 06/25/16 0537  INR 3.92 3.32 2.76 2.14   Cardiac Enzymes: No results for input(s): CKTOTAL, CKMB, CKMBINDEX, TROPONINI in the last 168 hours. BNP (last 3 results) No results for input(s): PROBNP in the last 8760 hours. HbA1C: No results for input(s): HGBA1C in the last 72 hours. CBG: No results for input(s): GLUCAP in the last 168 hours. Lipid Profile: No results for input(s): CHOL, HDL, LDLCALC, TRIG, CHOLHDL, LDLDIRECT in the last 72 hours. Thyroid Function Tests: No  results for input(s): TSH, T4TOTAL, FREET4, T3FREE, THYROIDAB in the last 72 hours. Anemia Panel: No results for input(s): VITAMINB12, FOLATE, FERRITIN, TIBC, IRON, RETICCTPCT in the last 72 hours. Sepsis Labs:  Recent Labs Lab 06/22/16 1247  LATICACIDVEN 1.70    Recent Results (from the past 240 hour(s))  Blood culture (routine single)     Status: None   Collection Time: 06/20/16  4:37 PM  Result Value Ref Range Status   Culture VIRIDANS STREPTOCOCCUS  Final    Comment: Culture results may be compromised due to an excessive volume of blood received in culture bottles. Gram Stain Report Called to,Read Back By and Verified With: STACI VOICEMAIL DR [facility] 06-21-16 1140    Organism ID, Bacteria VIRIDANS STREPTOCOCCUS  Final      Susceptibility   Viridans streptococcus -  (no method available)    PENICILLIN 0.094 Intermediate     CEFTRIAXONE 0.047 Sensitive     LEVOFLOXACIN 0.75 Sensitive  Blood culture (routine x 2)     Status: Abnormal   Collection Time: 06/22/16  1:16 PM  Result Value Ref Range Status   Specimen Description BLOOD RIGHT ANTECUBITAL  Final   Special Requests BOTTLES DRAWN AEROBIC ONLY 6CC  Final   Culture  Setup Time   Final    AEROBIC BOTTLE ONLY GRAM POSITIVE COCCI IN CHAINS CRITICAL RESULT CALLED TO, READ BACK BY AND VERIFIED WITH: TO LBAJBUS(PHARMD) BY TCLEVELAND 06/23/2016 AT 5:24AM    Culture (A)  Final    VIRIDANS STREPTOCOCCUS SUSCEPTIBILITIES PERFORMED ON PREVIOUS CULTURE WITHIN THE LAST 5 DAYS.    Report Status 06/25/2016 FINAL  Final  Blood culture (routine x 2)     Status: Abnormal   Collection Time: 06/22/16  1:22 PM  Result Value Ref Range Status   Specimen Description BLOOD RIGHT HAND  Final   Special Requests BOTTLES DRAWN AEROBIC AND ANAEROBIC 5CC  Final   Culture  Setup Time   Final    IN BOTH AEROBIC AND ANAEROBIC BOTTLES GRAM POSITIVE COCCI IN CHAINS CRITICAL RESULT CALLED TO, READ BACK BY AND VERIFIED WITH: TO   LBAJBUS(PHARMD) BY TCLEVELAND 06/23/2016 AT 5:24AM    Culture VIRIDANS STREPTOCOCCUS (A)  Final   Report Status 06/25/2016 FINAL  Final   Organism ID, Bacteria VIRIDANS STREPTOCOCCUS  Final      Susceptibility   Viridans streptococcus - MIC*    PENICILLIN Value in next row Intermediate      INTERMEDIATE0.25    CEFTRIAXONE Value in next row Sensitive      SENSITIVE<=0.12    ERYTHROMYCIN Value in next row Sensitive      SENSITIVE<=0.12    LEVOFLOXACIN Value in next row Sensitive      SENSITIVE<=0.12    VANCOMYCIN Value in next row Sensitive      SENSITIVE<=0.12    * VIRIDANS STREPTOCOCCUS  Blood Culture ID Panel (Reflexed)     Status: Abnormal   Collection Time: 06/22/16  1:22 PM  Result Value Ref Range Status   Enterococcus species NOT DETECTED NOT DETECTED Final   Listeria monocytogenes NOT DETECTED NOT DETECTED Final   Staphylococcus species NOT DETECTED NOT DETECTED Final   Staphylococcus aureus NOT DETECTED NOT DETECTED Final   Streptococcus species DETECTED (A) NOT DETECTED Final    Comment: CRITICAL RESULT CALLED TO, READ BACK BY AND VERIFIED WITH: TO LBAJBUS(PHARMD) BY TCLEVELAND 06/23/2016 AT 5:24AM    Streptococcus agalactiae NOT DETECTED NOT DETECTED Final   Streptococcus pneumoniae NOT DETECTED NOT DETECTED Final   Streptococcus pyogenes NOT DETECTED NOT DETECTED Final   Acinetobacter baumannii NOT DETECTED NOT DETECTED Final   Enterobacteriaceae species NOT DETECTED NOT DETECTED Final   Enterobacter cloacae complex NOT DETECTED NOT DETECTED Final   Escherichia coli NOT DETECTED NOT DETECTED Final   Klebsiella oxytoca NOT DETECTED NOT DETECTED Final   Klebsiella pneumoniae NOT DETECTED NOT DETECTED Final   Proteus species NOT DETECTED NOT DETECTED Final   Serratia marcescens NOT DETECTED NOT DETECTED Final   Haemophilus influenzae NOT DETECTED NOT DETECTED Final   Neisseria meningitidis NOT DETECTED NOT DETECTED Final   Pseudomonas aeruginosa NOT DETECTED NOT  DETECTED Final   Candida albicans NOT DETECTED NOT DETECTED Final   Candida glabrata NOT DETECTED NOT DETECTED Final   Candida krusei NOT DETECTED NOT DETECTED Final   Candida parapsilosis NOT DETECTED NOT DETECTED Final   Candida tropicalis NOT DETECTED NOT DETECTED Final  Urine culture     Status: None   Collection Time:  06/22/16  5:39 PM  Result Value Ref Range Status   Specimen Description URINE, RANDOM  Final   Special Requests NONE  Final   Culture NO GROWTH  Final   Report Status 06/24/2016 FINAL  Final  Culture, blood (routine x 2)     Status: None (Preliminary result)   Collection Time: 06/25/16  5:35 AM  Result Value Ref Range Status   Specimen Description BLOOD LEFT ARM  Final   Special Requests BOTTLES DRAWN AEROBIC AND ANAEROBIC 5CC EACH  Final   Culture NO GROWTH < 12 HOURS  Final   Report Status PENDING  Incomplete  Culture, blood (routine x 2)     Status: None (Preliminary result)   Collection Time: 06/25/16  5:37 AM  Result Value Ref Range Status   Specimen Description BLOOD RIGHT ARM  Final   Special Requests BOTTLES DRAWN AEROBIC ONLY 5CC  Final   Culture NO GROWTH < 12 HOURS  Final   Report Status PENDING  Incomplete         Radiology Studies: No results found.      Scheduled Meds: . atorvastatin  10 mg Oral q1800  . bisoprolol  2.5 mg Oral Daily  . cefTRIAXone (ROCEPHIN)  IV  2 g Intravenous Q24H  . furosemide  5 mg Oral Daily  . ramipril  5 mg Oral Daily  . sodium chloride  1,000 mL Intravenous Once  . sodium chloride flush  3 mL Intravenous Q12H  . Warfarin - Pharmacist Dosing Inpatient   Does not apply q1800   Continuous Infusions:    LOS: 2 days    Time spent: 39 minutes    Jshaun Abernathy, MD [facility] Pager [phone]  If 7PM-7AM, please contact night-coverage [email or web address] Password [facility] 06/25/2016, 6:21 PM

## 2016-06-26 NOTE — Progress Notes (Signed)
ANTICOAGULATION CONSULT NOTE - Follow Up Consult  Pharmacy Consult for Coumadin Indication: atrial fibrillation  Allergies  Allergen Reactions  . Hydralazine Hcl Other (See Comments)    Headache     Patient Measurements: Height: 4' 11.06" (150 cm) Weight: 151 lb 6.4 oz (68.7 kg) IBW/kg (Calculated) : 43.33  Vital Signs: Temp: 98.3 F (36.8 C) (01/31 1055) Temp Source: Oral (01/31 1055) BP: 133/80 (01/31 1111) Pulse Rate: 86 (01/31 1111)  Labs:  Recent Labs  06/24/16 0554 06/25/16 0537 06/25/16 0921 06/26/16 0820  HGB 9.3*  --  10.0* 9.7*  HCT 28.3*  --  30.1* 28.8*  PLT 199  --  215 204  LABPROT 29.8* 24.2*  --  23.4*  INR 2.76 2.14  --  2.04  CREATININE 1.07*  --  1.02* 0.98    Estimated Creatinine Clearance: 38.7 mL/min (by C-G formula based on SCr of 0.98 mg/dL).   Medications:  Scheduled:  . atorvastatin  10 mg Oral q1800  . bisoprolol  2.5 mg Oral Daily  . cefTRIAXone (ROCEPHIN)  IV  2 g Intravenous Q24H  . furosemide  5 mg Oral Daily  . ramipril  5 mg Oral Daily  . sodium chloride  1,000 mL Intravenous Once  . sodium chloride flush  3 mL Intravenous Q12H  . Warfarin - Pharmacist Dosing Inpatient   Does not apply q1800    Assessment: 83yo female with AFib, admitted with supratherapeutic INR &last dose on 1/26. Hg stable, pltc wnl. INR decreased but rate of fall significantly slowed.  No bleeding noted  Goal of Therapy:  INR 2-3 Monitor platelets by anticoagulation protocol: Yes   Plan:  Coumadin 1.5mg  today Daily INR Watch for s/s of bleeding  Gracy Bruins, PharmD [facility]

## 2016-06-27 LAB — CBC
HEMATOCRIT: 30.2 % — AB (ref 36.0–46.0)
Hemoglobin: 9.9 g/dL — ABNORMAL LOW (ref 12.0–15.0)
MCH: 26.9 pg (ref 26.0–34.0)
MCHC: 32.8 g/dL (ref 30.0–36.0)
MCV: 82.1 fL (ref 78.0–100.0)
Platelets: 221 10*3/uL (ref 150–400)
RBC: 3.68 MIL/uL — ABNORMAL LOW (ref 3.87–5.11)
RDW: 14.6 % (ref 11.5–15.5)
WBC: 6.1 10*3/uL (ref 4.0–10.5)

## 2016-06-27 LAB — PROTIME-INR
INR: 1.93
Prothrombin Time: 22.3 seconds — ABNORMAL HIGH (ref 11.4–15.2)

## 2016-06-27 LAB — MAGNESIUM: MAGNESIUM: 2 mg/dL (ref 1.7–2.4)

## 2016-06-27 LAB — BASIC METABOLIC PANEL
ANION GAP: 7 (ref 5–15)
BUN: 16 mg/dL (ref 6–20)
CHLORIDE: 108 mmol/L (ref 101–111)
CO2: 23 mmol/L (ref 22–32)
Calcium: 8.8 mg/dL — ABNORMAL LOW (ref 8.9–10.3)
Creatinine, Ser: 1.2 mg/dL — ABNORMAL HIGH (ref 0.44–1.00)
GFR calc non Af Amer: 42 mL/min — ABNORMAL LOW (ref >60)
GFR, EST AFRICAN AMERICAN: 48 mL/min — AB (ref >60)
Glucose, Bld: 101 mg/dL — ABNORMAL HIGH (ref 65–99)
Potassium: 4.5 mmol/L (ref 3.5–5.1)
SODIUM: 138 mmol/L (ref 135–145)

## 2016-06-27 MED ORDER — BISOPROLOL FUMARATE 5 MG PO TABS
2.5000 mg | ORAL_TABLET | Freq: Every day | ORAL | Status: DC
  Administered 2016-06-27 – 2016-06-28 (×2): 2.5 mg via ORAL
  Filled 2016-06-27 (×3): qty 0.5

## 2016-06-27 NOTE — Care Management Note (Addendum)
Case Management Note   Patient Details  Name: Alexis [name] MRN: [id number] Date of Birth: April 21, 1934  Subjective/Objective:    Admitted with bacteremia,  hx of hypertension, coronary artery disease status post CABG in 2004, CVA, atrial fibrillation(Coumadin).  From home with family /friends. Daughter states mom PTA independent with ADL's and used no assistive devices.  [name] (daughter) [phone]  PCP: [name]  Action/Plan: ID following....Marland KitchenMarland KitchenBlood cultures positive for Streptococcus viridans......TEE 1/31 showed small vegetations on the tissue mitral valve....the patient will need IV ABX x 6 weeks.....Marland KitchenPICC line to be placec 2/2. Plan is for pt to d/c with home health services. Pt with no insurance, CM has requested charity case from [facility] for IV ABX home infusion.   Expected Discharge Date:     06/28/16           Expected Discharge Plan:  [place]  In-House Referral:     Discharge planning Services  CM Consult  Post Acute Care Choice:    Choice offered to:  Adult Children [facility] Sander)  DME Arranged:  IV pump/equipment (CHARITY CASE) DME Agency:  [employer] CASE) /  Referral made with Pam @ [phone]  HH Arranged:  RN [place]:  [employer]/ referral made with Butch Penny @ [phone], awaiting charity case approval.  Status of Service:  In process, will continue to follow  If discussed at Long Length of Stay Meetings, dates discussed:    Additional Comments:  Sharin Mons, RN 06/27/2016, 7:58 PM

## 2016-06-27 NOTE — Progress Notes (Signed)
ANTICOAGULATION CONSULT NOTE - Follow Up Consult  Pharmacy Consult for Coumadin Indication: atrial fibrillation  Allergies  Allergen Reactions  . Hydralazine Hcl Other (See Comments)    Headache     Patient Measurements: Height: 4' 11.06" (150 cm) Weight: 151 lb 6.4 oz (68.7 kg) IBW/kg (Calculated) : 43.33  Vital Signs: Temp: 99.6 F (37.6 C) (02/01 0650) Temp Source: Oral (02/01 0650) BP: 135/70 (02/01 0650) Pulse Rate: 85 (02/01 0650)  Labs:  Recent Labs  06/25/16 0537  06/25/16 0921 06/26/16 0820 06/27/16 0427  HGB  --   < > 10.0* 9.7* 9.9*  HCT  --   --  30.1* 28.8* 30.2*  PLT  --   --  215 204 221  LABPROT 24.2*  --   --  23.4* 22.3*  INR 2.14  --   --  2.04 1.93  CREATININE  --   --  1.02* 0.98 1.20*  < > = values in this interval not displayed.  Estimated Creatinine Clearance: 31.6 mL/min (by C-G formula based on SCr of 1.2 mg/dL (H)).   Medications:  Scheduled:  . atorvastatin  10 mg Oral q1800  . bisoprolol  2.5 mg Oral Daily  . cefTRIAXone (ROCEPHIN)  IV  2 g Intravenous Q24H  . furosemide  5 mg Oral Daily  . gentamicin  60 mg Intravenous Q24H  . ramipril  5 mg Oral Daily  . sodium chloride  1,000 mL Intravenous Once  . sodium chloride flush  3 mL Intravenous Q12H  . Warfarin - Pharmacist Dosing Inpatient   Does not apply q1800    Assessment: 83yo female with AFib, admitted with supratherapeutic INR &last dose on 1/26. Hg stable, pltc wnl. INR decreased but rate of fall significantly slowed, unfortunately just below 2 this AM. No bleeding noted  Goal of Therapy:  INR 2-3 Monitor platelets by anticoagulation protocol: Yes   Plan:  Coumadin 2mg  x 1 today Daily INR Watch for s/s of bleeding   Gracy Bruins, PharmD [facility]

## 2016-06-27 NOTE — Progress Notes (Signed)
[facility]                                                                              Patient Demographics  Alexis [name], is a 83 y.o. female, DOB - [birth year], OJJ:[id number]  Admit date - 06/22/2016   Admitting Physician Maren Reamer, MD  Outpatient Primary MD for the patient is [name], MD  Outpatient specialists:   LOS - 4  days    Chief Complaint  Patient presents with  . + blood cul, chills       Brief summary   Patient is a 83 year old female history of hypertension, coronary artery disease status post CABG in 2004, CVA, atrial fibrillation on chronic Coumadin presenting to the ED with positive blood cultures from PCPs office. Blood cultures positive for Streptococcus viridans. 2-D echo obtained negative for vegetations however patient does have bioprosthetic valves and as such TEE has been recommended and ordered and patient to undergo TEE 06/26/2016. Patient on IV Rocephin. ID following.    Assessment & Plan    Principal Problem: - Streptococcus viridans bacteremia With mitral valve endocarditis Patient was sent to the ED from PCPs office due to positive blood cultures. -  Patient was noted to be having low-grade fevers, chills, generalized weakness or fatigue. Cultures drawn at PCPs office from 06/20/2016 was positive for Streptococcus viridans. -  Repeat blood cultures preliminary results positive for Streptococcus.  - 2-D echo with EF of 55-60%, mild LVH, right ventricle poorly visualized probably mildly dilated and mildly hypokinetic, bioprosthetic aortic valve appeared to function normally no vegetation noted. Bioprosthetic mitral valve appears to have at least mild prostatic valve stenosis. Cannot rule out vegetation.  - TEE 1/31 showed small vegetations on the tissue mitral valve, and was okay with no abscess, trivial MR. Recommended treat for SBE with PICC line and antibiotics and repeat TEE after treatment - ID following,  recommended ceftriaxone and gentamicin, 6 weeks through March 12, will need cardiology follow-up prior to treatment completion, ID follow-up - Repeat blood cultures 1/30 negative so far, discussed with infectious disease, Dr [name], recommended blood cultures to be negative for 72 hours before PICC line to be placed, tomorrow on Friday, 2/2   hypertension Blood pressure improved.   congestive heart failure/ CAD - Patient is euvolemic on examination with no signs of volume overload.  - Blood pressure improved with hydration.  - Continue bisoprolol, Lipitor, Lasix.  Atrial fibrillation -  CHA2DS2VASC score 6 - Continue bisoprolol for rate control. -  INR therapeutic. -  Coumadin per pharmacy.   Sinus pause, asymptomatic Appreciate cardiology follow-up, no further pauses overnight but now in Afib with RVR. Discussed with Dr. Debara Pickett, restarted beta blocker.  Code Status: full  DVT Prophylaxis: coumadin  Family Communication: Discussed in detail with the patient, all imaging results, lab results explained to the patient and daughter    Disposition Plan: after PICC placed and cleared by ID, likely tomorrow  Time Spent in minutes  25 minutes  Procedures:  TEE   Consultants:   Cardiology  ID   Antimicrobials:   IV gentamicin to be started today 1/31  IV  Rocephin   Medications  Scheduled Meds: . atorvastatin  10 mg Oral q1800  . bisoprolol  2.5 mg Oral Daily  . cefTRIAXone (ROCEPHIN)  IV  2 g Intravenous Q24H  . furosemide  5 mg Oral Daily  . gentamicin  60 mg Intravenous Q24H  . ramipril  5 mg Oral Daily  . sodium chloride  1,000 mL Intravenous Once  . sodium chloride flush  3 mL Intravenous Q12H  . Warfarin - Pharmacist Dosing Inpatient   Does not apply q1800   Continuous Infusions: PRN Meds:.acetaminophen **OR** acetaminophen, bisacodyl, HYDROcodone-acetaminophen, ketorolac, magnesium citrate, ondansetron **OR** ondansetron (ZOFRAN) IV, senna-docusate,  traZODone   Antibiotics   Anti-infectives    Start     Dose/Rate Route Frequency Ordered Stop   06/27/16 1600  gentamicin (GARAMYCIN) IVPB 60 mg     60 mg 100 mL/hr over 30 Minutes Intravenous Every 24 hours 06/26/16 1457     06/26/16 1600  gentamicin (GARAMYCIN) IVPB 80 mg     80 mg 100 mL/hr over 30 Minutes Intravenous  Once 06/26/16 1457 06/26/16 1610   06/23/16 1400  vancomycin (VANCOCIN) IVPB 750 mg/150 ml premix  Status:  Discontinued     750 mg 150 mL/hr over 60 Minutes Intravenous Every 24 hours 06/22/16 1341 06/23/16 0559   06/23/16 0630  cefTRIAXone (ROCEPHIN) 2 g in dextrose 5 % 50 mL IVPB     2 g 100 mL/hr over 30 Minutes Intravenous Every 24 hours 06/23/16 0559     06/22/16 1315  vancomycin (VANCOCIN) 1,250 mg in sodium chloride 0.9 % 250 mL IVPB     1,250 mg 166.7 mL/hr over 90 Minutes Intravenous  Once 06/22/16 1312 06/22/16 1508        Subjective:   Chantille Chupakhi was seen and examined today.  No specific complaints.  Patient denies dizziness, chest pain, shortness of breath, abdominal pain, N/V/D/C, new weakness, numbess, tingling. No acute events overnight.  Talk to the patient through her daughter who speaks [place].  Objective:   Vitals:   06/26/16 1110 06/26/16 1111 06/26/16 1239 06/27/16 0650  BP:  133/80 (!) 107/55 135/70  Pulse: 78 86  85  Resp: (!) 32 (!) 26  18  Temp:    99.6 F (37.6 C)  TempSrc:    Oral  SpO2: 92% 95%  96%  Weight:      Height:        Intake/Output Summary (Last 24 hours) at 06/27/16 1512 Last data filed at 06/27/16 0716  Gross per 24 hour  Intake              340 ml  Output                0 ml  Net              340 ml     Wt Readings from Last 3 Encounters:  06/26/16 68.7 kg (151 lb 6.4 oz)  06/06/16 72.6 kg (160 lb)  06/20/14 71.7 kg (158 lb)     Exam  General: Alert and oriented x 3, NAD  HEENT:    Neck: Supple, no JVD  Cardiovascular: S1 S2 auscultated,Early systolic murmur 2/6  Respiratory:  CTAB  Gastrointestinal: Soft, nontender, nondistended, + bowel sounds  Ext: no cyanosis clubbing or edema  Neuro: AAOx3, Cr N's II- XII. Strength 5/5 upper and lower extremities bilaterally  Skin: No rashes  Psych: Normal affect and demeanor, alert and oriented x3    Data Reviewed:  I have personally reviewed following labs and imaging studies  Micro Results Recent Results (from the past 240 hour(s))  Blood culture (routine single)     Status: None   Collection Time: 06/20/16  4:37 PM  Result Value Ref Range Status   Culture VIRIDANS STREPTOCOCCUS  Final    Comment: Culture results may be compromised due to an excessive volume of blood received in culture bottles. Gram Stain Report Called to,Read Back By and Verified With: STACI VOICEMAIL DR [facility] 06-21-16 1140    Organism ID, Bacteria VIRIDANS STREPTOCOCCUS  Final      Susceptibility   Viridans streptococcus -  (no method available)    PENICILLIN 0.094 Intermediate     CEFTRIAXONE 0.047 Sensitive     LEVOFLOXACIN 0.75 Sensitive   Blood culture (routine x 2)     Status: Abnormal   Collection Time: 06/22/16  1:16 PM  Result Value Ref Range Status   Specimen Description BLOOD RIGHT ANTECUBITAL  Final   Special Requests BOTTLES DRAWN AEROBIC ONLY 6CC  Final   Culture  Setup Time   Final    AEROBIC BOTTLE ONLY GRAM POSITIVE COCCI IN CHAINS CRITICAL RESULT CALLED TO, READ BACK BY AND VERIFIED WITH: TO LBAJBUS(PHARMD) BY TCLEVELAND 06/23/2016 AT 5:24AM    Culture (A)  Final    VIRIDANS STREPTOCOCCUS SUSCEPTIBILITIES PERFORMED ON PREVIOUS CULTURE WITHIN THE LAST 5 DAYS.    Report Status 06/25/2016 FINAL  Final  Blood culture (routine x 2)     Status: Abnormal   Collection Time: 06/22/16  1:22 PM  Result Value Ref Range Status   Specimen Description BLOOD RIGHT HAND  Final   Special Requests BOTTLES DRAWN AEROBIC AND ANAEROBIC 5CC  Final   Culture  Setup Time   Final    IN BOTH AEROBIC AND ANAEROBIC BOTTLES GRAM  POSITIVE COCCI IN CHAINS CRITICAL RESULT CALLED TO, READ BACK BY AND VERIFIED WITH: TO  LBAJBUS(PHARMD) BY TCLEVELAND 06/23/2016 AT 5:24AM    Culture VIRIDANS STREPTOCOCCUS (A)  Final   Report Status 06/25/2016 FINAL  Final   Organism ID, Bacteria VIRIDANS STREPTOCOCCUS  Final      Susceptibility   Viridans streptococcus - MIC*    PENICILLIN Value in next row Intermediate      INTERMEDIATE0.25    CEFTRIAXONE Value in next row Sensitive      SENSITIVE<=0.12    ERYTHROMYCIN Value in next row Sensitive      SENSITIVE<=0.12    LEVOFLOXACIN Value in next row Sensitive      SENSITIVE<=0.12    VANCOMYCIN Value in next row Sensitive      SENSITIVE<=0.12    * VIRIDANS STREPTOCOCCUS  Blood Culture ID Panel (Reflexed)     Status: Abnormal   Collection Time: 06/22/16  1:22 PM  Result Value Ref Range Status   Enterococcus species NOT DETECTED NOT DETECTED Final   Listeria monocytogenes NOT DETECTED NOT DETECTED Final   Staphylococcus species NOT DETECTED NOT DETECTED Final   Staphylococcus aureus NOT DETECTED NOT DETECTED Final   Streptococcus species DETECTED (A) NOT DETECTED Final    Comment: CRITICAL RESULT CALLED TO, READ BACK BY AND VERIFIED WITH: TO LBAJBUS(PHARMD) BY TCLEVELAND 06/23/2016 AT 5:24AM    Streptococcus agalactiae NOT DETECTED NOT DETECTED Final   Streptococcus pneumoniae NOT DETECTED NOT DETECTED Final   Streptococcus pyogenes NOT DETECTED NOT DETECTED Final   Acinetobacter baumannii NOT DETECTED NOT DETECTED Final   Enterobacteriaceae species NOT DETECTED NOT DETECTED Final   Enterobacter cloacae complex NOT  DETECTED NOT DETECTED Final   Escherichia coli NOT DETECTED NOT DETECTED Final   Klebsiella oxytoca NOT DETECTED NOT DETECTED Final   Klebsiella pneumoniae NOT DETECTED NOT DETECTED Final   Proteus species NOT DETECTED NOT DETECTED Final   Serratia marcescens NOT DETECTED NOT DETECTED Final   Haemophilus influenzae NOT DETECTED NOT DETECTED Final   Neisseria  meningitidis NOT DETECTED NOT DETECTED Final   Pseudomonas aeruginosa NOT DETECTED NOT DETECTED Final   Candida albicans NOT DETECTED NOT DETECTED Final   Candida glabrata NOT DETECTED NOT DETECTED Final   Candida krusei NOT DETECTED NOT DETECTED Final   Candida parapsilosis NOT DETECTED NOT DETECTED Final   Candida tropicalis NOT DETECTED NOT DETECTED Final  Urine culture     Status: None   Collection Time: 06/22/16  5:39 PM  Result Value Ref Range Status   Specimen Description URINE, RANDOM  Final   Special Requests NONE  Final   Culture NO GROWTH  Final   Report Status 06/24/2016 FINAL  Final  Culture, blood (routine x 2)     Status: None (Preliminary result)   Collection Time: 06/25/16  5:35 AM  Result Value Ref Range Status   Specimen Description BLOOD LEFT ARM  Final   Special Requests BOTTLES DRAWN AEROBIC AND ANAEROBIC 5CC EACH  Final   Culture NO GROWTH 1 DAY  Final   Report Status PENDING  Incomplete  Culture, blood (routine x 2)     Status: None (Preliminary result)   Collection Time: 06/25/16  5:37 AM  Result Value Ref Range Status   Specimen Description BLOOD RIGHT ARM  Final   Special Requests BOTTLES DRAWN AEROBIC ONLY 5CC  Final   Culture NO GROWTH 1 DAY  Final   Report Status PENDING  Incomplete    Radiology Reports No results found.  Lab Data:  CBC:  Recent Labs Lab 06/20/16 1637 06/22/16 1221 06/23/16 0434 06/24/16 0554 06/25/16 0921 06/26/16 0820 06/27/16 0427  WBC 8.1 10.4 7.7 6.9 7.2 7.5 6.1  NEUTROABS 6.5 8.4*  --   --  5.6  --   --   HGB 11.1* 11.0* 10.2* 9.3* 10.0* 9.7* 9.9*  HCT 32.5* 32.8* 29.9* 28.3* 30.1* 28.8* 30.2*  MCV 80.8 81.0 80.2 80.4 80.1 81.1 82.1  PLT 231.0 233 206 199 215 204 967   Basic Metabolic Panel:  Recent Labs Lab 06/23/16 0434 06/24/16 0554 06/25/16 0921 06/26/16 0820 06/26/16 1744 06/27/16 0427  NA 135 138 137 136  --  138  K 4.1 3.8 3.8 3.7  --  4.5  CL 107 107 108 104  --  108  CO2 20* 20* 21* 22  --   23  GLUCOSE 108* 103* 120* 103*  --  101*  BUN 15 15 10 10   --  16  CREATININE 1.15* 1.07* 1.02* 0.98  --  1.20*  CALCIUM 8.8* 8.7* 8.9 8.7*  --  8.8*  MG  --   --   --   --  1.9 2.0   GFR: Estimated Creatinine Clearance: 31.6 mL/min (by C-G formula based on SCr of 1.2 mg/dL (H)). Liver Function Tests:  Recent Labs Lab 06/20/16 1637 06/22/16 1221 06/23/16 0434  AST 25 29 26   ALT 24 22 20   ALKPHOS 77 79 72  BILITOT 0.7 0.5 0.7  PROT 7.5 7.3 6.8  ALBUMIN 3.4* 3.0* 2.7*   No results for input(s): LIPASE, AMYLASE in the last 168 hours. No results for input(s): AMMONIA in the last 168 hours.  Coagulation Profile:  Recent Labs Lab 06/23/16 0434 06/24/16 0554 06/25/16 0537 06/26/16 0820 06/27/16 0427  INR 3.32 2.76 2.14 2.04 1.93   Cardiac Enzymes: No results for input(s): CKTOTAL, CKMB, CKMBINDEX, TROPONINI in the last 168 hours. BNP (last 3 results) No results for input(s): PROBNP in the last 8760 hours. HbA1C: No results for input(s): HGBA1C in the last 72 hours. CBG: No results for input(s): GLUCAP in the last 168 hours. Lipid Profile: No results for input(s): CHOL, HDL, LDLCALC, TRIG, CHOLHDL, LDLDIRECT in the last 72 hours. Thyroid Function Tests: No results for input(s): TSH, T4TOTAL, FREET4, T3FREE, THYROIDAB in the last 72 hours. Anemia Panel: No results for input(s): VITAMINB12, FOLATE, FERRITIN, TIBC, IRON, RETICCTPCT in the last 72 hours. Urine analysis:    Component Value Date/Time   COLORURINE YELLOW 06/22/2016 1739   APPEARANCEUR CLEAR 06/22/2016 1739   LABSPEC 1.005 06/22/2016 1739   PHURINE 5.0 06/22/2016 1739   GLUCOSEU NEGATIVE 06/22/2016 1739   GLUCOSEU NEGATIVE 06/20/2016 1637   HGBUR NEGATIVE 06/22/2016 1739   BILIRUBINUR NEGATIVE 06/22/2016 1739   KETONESUR NEGATIVE 06/22/2016 1739   PROTEINUR NEGATIVE 06/22/2016 1739   UROBILINOGEN 1.0 06/20/2016 1637   NITRITE NEGATIVE 06/22/2016 1739   LEUKOCYTESUR NEGATIVE 06/22/2016 Ruth M.D. [facility] 06/27/2016, 3:12 PM  Pager: [phone] Between 7am to 7pm - call Pager - [phone]  After 7pm go to [email or web address] - password [email or web address]  Call night coverage person covering after 7pm

## 2016-06-27 NOTE — Progress Notes (Addendum)
DAILY PROGRESS NOTE  Subjective:  A-fib with RVR overnight. B-blocker has been held due to 2-2.5 second pauses with a-fib. There was concern this may have been related to conduction system disease secondary to endocarditis, however, there was no evidence of annular abscess.   Vitals: Temp:  [97.8 F (36.6 C)-99.6 F (37.6 C)] 99.6 F (37.6 C) (02/01 0650) Pulse Rate:  [78-98] 85 (02/01 0650) Resp:  [18-40] 18 (02/01 0650) BP: (107-198)/(55-109) 135/70 (02/01 0650) SpO2:  [91 %-100 %] 96 % (02/01 0650) Weight change:   Intake/Output from previous day: 01/31 0701 - 02/01 0700 In: 450 [IV Piggyback:450] Out: -   Intake/Output from this shift: Total I/O In: 240 [P.O.:240] Out: -   Medications: No current facility-administered medications on file prior to encounter.    Current Outpatient Prescriptions on File Prior to Encounter  Medication Sig Dispense Refill  . atorvastatin (LIPITOR) 10 MG tablet Take 1 tablet (10 mg total) by mouth daily. 90 tablet 3  . bisoprolol (ZEBETA) 5 MG tablet Take 0.5 tablets (2.5 mg total) by mouth daily. (Patient taking differently: Take 1.25 mg by mouth daily. ) 90 tablet 1  . Cholecalciferol (VITAMIN D3) 2000 units capsule Take 1 capsule (2,000 Units total) by mouth daily. 100 capsule 3  . torsemide (DEMADEX) 5 MG tablet Take 0.5 tablets (2.5 mg total) by mouth daily. 30 tablet 11  . warfarin (COUMADIN) 2.5 MG tablet Take as directed by anticoagulation clinic (Patient taking differently: Take 1.25-2.5 mg by mouth See admin instructions. 1.25 mg in the evening on Sun/Tues/Thurs/Sat and 2.5 mg on Mon/Wed/Fri) 90 tablet 0  . loratadine (CLARITIN) 10 MG tablet Take 1 tablet (10 mg total) by mouth daily. (Patient not taking: Reported on 06/22/2016) 100 tablet 3  . ramipril (ALTACE) 5 MG capsule Take 1 capsule (5 mg total) by mouth daily. (Patient not taking: Reported on 06/22/2016) 90 capsule 3  . triamcinolone ointment (KENALOG) 0.5 % Apply 1  application topically 2 (two) times daily. (Patient not taking: Reported on 06/22/2016) 60 g 3    Physical Exam: General appearance: alert and no distress Lungs: clear to auscultation bilaterally Heart: regular rate and rhythm and systolic murmur: early systolic 2/6, crescendo at apex Extremities: extremities normal, atraumatic, no cyanosis or edema Neurologic: Grossly normal  Lab Results: Results for orders placed or performed during the hospital encounter of 06/22/16 (from the past 48 hour(s))  CBC with Differential/Platelet     Status: Abnormal   Collection Time: 06/25/16  9:21 AM  Result Value Ref Range   WBC 7.2 4.0 - 10.5 K/uL   RBC 3.76 (L) 3.87 - 5.11 MIL/uL   Hemoglobin 10.0 (L) 12.0 - 15.0 g/dL   HCT 30.1 (L) 36.0 - 46.0 %   MCV 80.1 78.0 - 100.0 fL   MCH 26.6 26.0 - 34.0 pg   MCHC 33.2 30.0 - 36.0 g/dL   RDW 14.1 11.5 - 15.5 %   Platelets 215 150 - 400 K/uL   Neutrophils Relative % 78 %   Neutro Abs 5.6 1.7 - 7.7 K/uL   Lymphocytes Relative 12 %   Lymphs Abs 0.9 0.7 - 4.0 K/uL   Monocytes Relative 9 %   Monocytes Absolute 0.6 0.1 - 1.0 K/uL   Eosinophils Relative 1 %   Eosinophils Absolute 0.1 0.0 - 0.7 K/uL   Basophils Relative 0 %   Basophils Absolute 0.0 0.0 - 0.1 K/uL  Basic metabolic panel     Status: Abnormal   Collection  Time: 06/25/16  9:21 AM  Result Value Ref Range   Sodium 137 135 - 145 mmol/L   Potassium 3.8 3.5 - 5.1 mmol/L   Chloride 108 101 - 111 mmol/L   CO2 21 (L) 22 - 32 mmol/L   Glucose, Bld 120 (H) 65 - 99 mg/dL   BUN 10 6 - 20 mg/dL   Creatinine, Ser 1.02 (H) 0.44 - 1.00 mg/dL   Calcium 8.9 8.9 - 10.3 mg/dL   GFR calc non Af Amer 51 (L) >60 mL/min   GFR calc Af Amer 59 (L) >60 mL/min    Comment: (NOTE) The eGFR has been calculated using the CKD EPI equation. This calculation has not been validated in all clinical situations. eGFR's persistently <60 mL/min signify possible Chronic Kidney Disease.    Anion gap 8 5 - 15  Protime-INR      Status: Abnormal   Collection Time: 06/26/16  8:20 AM  Result Value Ref Range   Prothrombin Time 23.4 (H) 11.4 - 15.2 seconds   INR 2.04   CBC     Status: Abnormal   Collection Time: 06/26/16  8:20 AM  Result Value Ref Range   WBC 7.5 4.0 - 10.5 K/uL   RBC 3.55 (L) 3.87 - 5.11 MIL/uL   Hemoglobin 9.7 (L) 12.0 - 15.0 g/dL   HCT 28.8 (L) 36.0 - 46.0 %   MCV 81.1 78.0 - 100.0 fL   MCH 27.3 26.0 - 34.0 pg   MCHC 33.7 30.0 - 36.0 g/dL   RDW 14.4 11.5 - 15.5 %   Platelets 204 150 - 400 K/uL  Basic metabolic panel     Status: Abnormal   Collection Time: 06/26/16  8:20 AM  Result Value Ref Range   Sodium 136 135 - 145 mmol/L   Potassium 3.7 3.5 - 5.1 mmol/L   Chloride 104 101 - 111 mmol/L   CO2 22 22 - 32 mmol/L   Glucose, Bld 103 (H) 65 - 99 mg/dL   BUN 10 6 - 20 mg/dL   Creatinine, Ser 0.98 0.44 - 1.00 mg/dL   Calcium 8.7 (L) 8.9 - 10.3 mg/dL   GFR calc non Af Amer 53 (L) >60 mL/min   GFR calc Af Amer >60 >60 mL/min    Comment: (NOTE) The eGFR has been calculated using the CKD EPI equation. This calculation has not been validated in all clinical situations. eGFR's persistently <60 mL/min signify possible Chronic Kidney Disease.    Anion gap 10 5 - 15  Magnesium     Status: None   Collection Time: 06/26/16  5:44 PM  Result Value Ref Range   Magnesium 1.9 1.7 - 2.4 mg/dL  Protime-INR     Status: Abnormal   Collection Time: 06/27/16  4:27 AM  Result Value Ref Range   Prothrombin Time 22.3 (H) 11.4 - 15.2 seconds   INR 1.93   CBC     Status: Abnormal   Collection Time: 06/27/16  4:27 AM  Result Value Ref Range   WBC 6.1 4.0 - 10.5 K/uL   RBC 3.68 (L) 3.87 - 5.11 MIL/uL   Hemoglobin 9.9 (L) 12.0 - 15.0 g/dL   HCT 30.2 (L) 36.0 - 46.0 %   MCV 82.1 78.0 - 100.0 fL   MCH 26.9 26.0 - 34.0 pg   MCHC 32.8 30.0 - 36.0 g/dL   RDW 14.6 11.5 - 15.5 %   Platelets 221 150 - 400 K/uL  Basic metabolic panel     Status:  Abnormal   Collection Time: 06/27/16  4:27 AM  Result Value  Ref Range   Sodium 138 135 - 145 mmol/L   Potassium 4.5 3.5 - 5.1 mmol/L    Comment: DELTA CHECK NOTED   Chloride 108 101 - 111 mmol/L   CO2 23 22 - 32 mmol/L   Glucose, Bld 101 (H) 65 - 99 mg/dL   BUN 16 6 - 20 mg/dL   Creatinine, Ser 1.20 (H) 0.44 - 1.00 mg/dL   Calcium 8.8 (L) 8.9 - 10.3 mg/dL   GFR calc non Af Amer 42 (L) >60 mL/min   GFR calc Af Amer 48 (L) >60 mL/min    Comment: (NOTE) The eGFR has been calculated using the CKD EPI equation. This calculation has not been validated in all clinical situations. eGFR's persistently <60 mL/min signify possible Chronic Kidney Disease.    Anion gap 7 5 - 15  Magnesium     Status: None   Collection Time: 06/27/16  4:27 AM  Result Value Ref Range   Magnesium 2.0 1.7 - 2.4 mg/dL    Imaging: No results found.  Assessment:  1. Principal Problem: 2.   Bacteremia due to Streptococcus 3. Active Problems: 4.   Essential hypertension 5.   Coronary atherosclerosis 6.   Congestive heart failure ([place]) 7.   Long term current use of anticoagulant therapy 8.   Eczema 9.   Positive blood cultures 10.   Atrial fibrillation ([place]) 11.   Plan:  1. No pauses overnight, but now in a-fib with RVR. Pauses are quite common with a-fib - concerning features for conduction disease related to endocarditis would be seen with annular abscess and are typically heart block - no evidence for this. Pauses <2.5 seconds and asymptomatic. She is now in RVR without b-blocker. D/w cardiac EP - they recommend resuming her b-blocker.  No further cardiac suggestions at this time. D/W Dr. Tana Coast and the patient's daughter who is also a doctor on the phone today. Cardiology will sign-off. Call with questions. Daughter would like her to follow-up with Dr. Bronson Ing in [place] (they live in [place]).  Time Spent Directly with Patient:  15 minutes  Length of Stay:  LOS: 4 days   Pixie Casino, MD, [facility] Attending Cardiologist [place] 06/27/2016, 8:57 AM

## 2016-06-28 ENCOUNTER — Inpatient Hospital Stay (HOSPITAL_COMMUNITY): Payer: Self-pay

## 2016-06-28 LAB — BASIC METABOLIC PANEL
ANION GAP: 11 (ref 5–15)
BUN: 14 mg/dL (ref 6–20)
CALCIUM: 9 mg/dL (ref 8.9–10.3)
CO2: 21 mmol/L — AB (ref 22–32)
Chloride: 104 mmol/L (ref 101–111)
Creatinine, Ser: 1.01 mg/dL — ABNORMAL HIGH (ref 0.44–1.00)
GFR calc Af Amer: 59 mL/min — ABNORMAL LOW (ref >60)
GFR calc non Af Amer: 51 mL/min — ABNORMAL LOW (ref >60)
GLUCOSE: 99 mg/dL (ref 65–99)
Potassium: 4.3 mmol/L (ref 3.5–5.1)
Sodium: 136 mmol/L (ref 135–145)

## 2016-06-28 LAB — CBC
HEMATOCRIT: 30 % — AB (ref 36.0–46.0)
Hemoglobin: 9.9 g/dL — ABNORMAL LOW (ref 12.0–15.0)
MCH: 27.2 pg (ref 26.0–34.0)
MCHC: 33 g/dL (ref 30.0–36.0)
MCV: 82.4 fL (ref 78.0–100.0)
Platelets: 119 10*3/uL — ABNORMAL LOW (ref 150–400)
RBC: 3.64 MIL/uL — ABNORMAL LOW (ref 3.87–5.11)
RDW: 15.1 % (ref 11.5–15.5)
WBC: 6.1 10*3/uL (ref 4.0–10.5)

## 2016-06-28 LAB — PROTIME-INR
INR: 1.69
Prothrombin Time: 20.1 seconds — ABNORMAL HIGH (ref 11.4–15.2)

## 2016-06-28 MED ORDER — WARFARIN SODIUM 2.5 MG PO TABS
2.5000 mg | ORAL_TABLET | Freq: Once | ORAL | Status: AC
  Administered 2016-06-28: 2.5 mg via ORAL
  Filled 2016-06-28: qty 1

## 2016-06-28 MED ORDER — SODIUM CHLORIDE 0.9% FLUSH: 10.0000 mL | INTRAVENOUS | Status: DC | PRN

## 2016-06-28 MED ORDER — GENTAMICIN IN SALINE 1.2-0.9 MG/ML-% IV SOLN: 60.0000 mg | INTRAVENOUS | 60 refills | Status: DC

## 2016-06-28 MED ORDER — DEXTROSE 5 % IV SOLN: 2.0000 g | INTRAVENOUS | 1 refills | Status: DC

## 2016-06-28 MED ORDER — HEPARIN SOD (PORK) LOCK FLUSH 100 UNIT/ML IV SOLN
250.0000 [IU] | INTRAVENOUS | Status: AC | PRN
  Administered 2016-06-28: 250 [IU]

## 2016-06-28 NOTE — Progress Notes (Signed)
Used [place], [place] #[id number] to explain to pt. That I was her nurse until 7pm tonight, and to see if she was in any pain.  Pt. Stated she was dizzy but no pain.  Also informed pt. That I was given her coumadin now and will not need another dose tonight when she gets home, to resume tomorrow 2/3.  Informed pt. The the IV team was coming to place the PICC line so she could take antibiotics at home and that she needs to lay down and wait for them.  Will continue to care for pt.  Alphonzo Lemmings, RN

## 2016-06-28 NOTE — Progress Notes (Signed)
Spoke with Patient's daughter on phone who was tearful, but stated she will come up after work around 4:30 pm today to learn PICC care. Daughter concerned about cost and case manager Levada Dy has spoken with daughter. Daughter has multiple concerns and stated "if patient gets the picc line that means you can kick her out". Explained to daughter about PICC line as well as Dr. Tana Coast on the phone at bedside this morning. Levada Dy, CM updated.

## 2016-06-28 NOTE — Evaluation (Signed)
Physical Therapy Evaluation and Discharge Patient Details Name: Alexis [name] MRN: [id number] DOB: [birth year] Today's Date: 06/28/2016   History of Present Illness  Patient is a 83 year old female history of hypertension, coronary artery disease status post CABG in 2004, CVA, atrial fibrillation on chronic Coumadin presenting with Streptococcus viridans bacteremia With mitral valve endocarditis.  Clinical Impression  Patient evaluated by Physical Therapy with no further acute PT needs identified. All education has been completed and the patient has no further questions. Tolerated higher level dynamic gait tasks although shows decreased conditioning with short distance gait. Some trouble with static balance tasks but overall did not require any assistance to safely mobilize this date. See below for any follow-up Physical Therapy or equipment needs. PT is signing off. Thank you for this referral.     Follow Up Recommendations Outpatient PT (when appropriate from cardiac standpoint (For balance and conditioning.)    Equipment Recommendations  None recommended by PT    Recommendations for Other Services       Precautions / Restrictions Precautions Precautions: None Restrictions Weight Bearing Restrictions: No      Mobility  Bed Mobility Overal bed mobility: Independent             General bed mobility comments: sitting EOB when PT entered room  Transfers Overall transfer level: Independent                  Ambulation/Gait Ambulation/Gait assistance: Modified independent (Device/Increase time) Ambulation Distance (Feet): 100 Feet Assistive device: None Gait Pattern/deviations: Decreased stride length;Drifts right/left   Gait velocity interpretation: at or above normal speed for age/gender General Gait Details: Minor deviation from straight path with higher level dynamic gait challenges. No overt loss of balance requiring physical assist to correct. Able to  perform backwards stepping, quick turns, (minor changes with gait speed when instructed,) mild deviations with gait during vertical and horizontal head turns. Moderate dyspnea noted, but after patient sat and O2 was checked, she registered 98% on room air. HR up to 102 while ambulating.  Stairs            Wheelchair Mobility    Modified Rankin (Stroke Patients Only)       Balance Overall balance assessment: Needs assistance Sitting-balance support: No upper extremity supported Sitting balance-Leahy Scale: Normal     Standing balance support: No upper extremity supported Standing balance-Leahy Scale: Normal       Tandem Stance - Right Leg: 4 (sec) Tandem Stance - Left Leg: 6 (sec) Rhomberg - Eyes Opened: 30 (sec) Rhomberg - Eyes Closed: 8 (sec (posterior LOB)) High level balance activites:  (see gait above)               Pertinent Vitals/Pain Pain Assessment: No/denies pain    Home Living Family/patient expects to be discharged to:: Private residence Living Arrangements: Other (Comment) (friend) Available Help at Discharge: Family;Available PRN/intermittently Type of Home: House Home Access: Stairs to enter Entrance Stairs-Rails: None Entrance Stairs-Number of Steps: 1 Home Layout: One level Home Equipment: Shower seat;Grab bars - tub/shower;Walker - 4 wheels      Prior Function Level of Independence: Independent               Hand Dominance        Extremity/Trunk Assessment   Upper Extremity Assessment Upper Extremity Assessment: Defer to OT evaluation    Lower Extremity Assessment Lower Extremity Assessment: Overall WFL for tasks assessed       Communication   Communication:  ([place]  language (interpeted through daughter today))  Cognition Arousal/Alertness: Awake/alert Behavior During Therapy: WFL for tasks assessed/performed Overall Cognitive Status: Within Functional Limits for tasks assessed                       General Comments General comments (skin integrity, edema, etc.): Dyspneic with gait, easily fatigued HR 102 but once seated SpO2 registered at 98% on room air. Daughter was able to interpret for PT and patient, difficulty communicating through telephone interpretation service.    Exercises     Assessment/Plan    PT Assessment Patent does not need any further PT services  PT Problem List            PT Treatment Interventions      PT Goals (Current goals can be found in the Care Plan section)  Acute Rehab PT Goals Patient Stated Goal: to get better PT Goal Formulation: All assessment and education complete, DC therapy    Frequency     Barriers to discharge        Co-evaluation               End of Session Equipment Utilized During Treatment: Gait belt Activity Tolerance: Patient tolerated treatment well Patient left: in bed;with call bell/phone within reach;with nursing/sitter in room Nurse Communication: Mobility status         Time: 0601-5615 PT Time Calculation (min) (ACUTE ONLY): 17 min   Charges:   PT Evaluation $PT Eval Low Complexity: 1 Procedure     PT G CodesEllouise Newer 2016-07-22, 1:57 PM Elayne Snare, PT, DPT

## 2016-06-28 NOTE — Discharge Summary (Signed)
Physician Discharge Summary   Patient ID: Alexis [name] MRN: [id number] DOB/AGE: 1934/05/18 83 y.o.  Admit date: 06/22/2016 Discharge date: 06/28/2016  Primary Care Physician:  [name], MD  Discharge Diagnoses:     Streptococcus bacteremia   Mitral valve endocarditis  Atrial fibrillation . Essential hypertension . Coronary atherosclerosis . Eczema   Consults:  Cardiology Infectious disease  Recommendations for Outpatient Follow-up:  1. PT/INR on Monday 07/01/16. Thereafter, CBC, PT/INR, CMET weekly , fax results to PCP or Dr. [name] at the [place] clinic 2. Keep the PICC line until seen by ID M.D. 3. Dr. [name] will arrange follow-up with ID clinic 4. Patient will need cardiology follow-up prior to the treatment completion 5. She will need both Rocephin and gentamicin for 6 weeks through March 12.     DIET: Heart healthy diet    Allergies:   Allergies  Allergen Reactions  . Hydralazine Hcl Other (See Comments)    Headache      DISCHARGE MEDICATIONS: Current Discharge Medication List    START taking these medications   Details  cefTRIAXone 2 g in dextrose 5 % 50 mL Inject 2 g into the vein daily. Stop date on March 12th Qty: 30 ampule, Refills: 1    gentamicin (GARAMYCIN) 1.2-0.9 MG/ML-% Inject 50 mLs (60 mg total) into the vein daily. Stop date March 12th Qty: 50 mL, Refills: 60      CONTINUE these medications which have NOT CHANGED   Details  acetaminophen (TYLENOL) 500 MG tablet Take 500-1,000 mg by mouth 2 (two) times daily as needed for fever.     atorvastatin (LIPITOR) 10 MG tablet Take 1 tablet (10 mg total) by mouth daily. Qty: 90 tablet, Refills: 3    bisoprolol (ZEBETA) 5 MG tablet Take 0.5 tablets (2.5 mg total) by mouth daily. Qty: 90 tablet, Refills: 1    Cholecalciferol (VITAMIN D3) 2000 units capsule Take 1 capsule (2,000 Units total) by mouth daily. Qty: 100 capsule, Refills: 3    torsemide (DEMADEX) 5 MG tablet Take 0.5 tablets (2.5  mg total) by mouth daily. Qty: 30 tablet, Refills: 11    UNABLE TO FIND [place] (from [place])/Choline alphoscerate: Take 400 mg by mouth two times a day    warfarin (COUMADIN) 2.5 MG tablet Take as directed by anticoagulation clinic Qty: 90 tablet, Refills: 0    loratadine (CLARITIN) 10 MG tablet Take 1 tablet (10 mg total) by mouth daily. Qty: 100 tablet, Refills: 3    ramipril (ALTACE) 5 MG capsule Take 1 capsule (5 mg total) by mouth daily. Qty: 90 capsule, Refills: 3    triamcinolone ointment (KENALOG) 0.5 % Apply 1 application topically 2 (two) times daily. Qty: 60 g, Refills: 3         Brief H and P: For complete details please refer to admission H and P, but in brief Patient is a 83 year old female history of hypertension, coronary artery disease status post CABG in 2004, CVA, atrial fibrillation on chronic Coumadin presenting to the ED with positive blood cultures from PCPs office.Blood cultures positive for Streptococcus viridans. 2-D echo obtained negative for vegetations however patient does have bioprosthetic valvesand assuch TEE has been recommended and ordered and patient to undergo TEE 06/26/2016. Patient on IV Rocephin. ID following.   Hospital Course:  Streptococcus viridans bacteremia With mitral valve endocarditis Patient was sent to the ED from PCPs office due to positive blood cultures. -  Patient was noted to be having low-grade fevers, chills, generalized weakness or fatigue.  Cultures drawn at PCPs office from 06/20/2016 was positive for Streptococcus viridans. -  Repeat blood cultures preliminary results positive for Streptococcus.  - 2-D echo with EF of 55-60%, mild LVH, right ventricle poorly visualized probably mildly dilated and mildly hypokinetic, bioprosthetic aortic valve appeared to function normally no vegetation noted. Bioprosthetic mitral valve appears to have at least mild prostatic valve stenosis. Cannot rule out vegetation.  - TEE 1/31 showed  small vegetations on the tissue mitral valve, and was okay with no abscess, trivial MR. Recommended treat for SBE with PICC line and antibiotics and repeat TEE after treatment - ID recommended ceftriaxone and gentamicin, 6 weeks through March 12, will need cardiology follow-up prior to treatment completion, ID follow-up - Repeat blood cultures 1/30 negative so far, PICC line placed. Will need weekly labs CBC, CMET, INR/PT while on antibiotics    hypertension Blood pressure improved.   congestive heart failure/ CAD - Patient is euvolemic on examination with no signs of volume overload.  - Blood pressure improved with hydration.  - Continue bisoprolol, Lipitor, Lasix.  Atrial fibrillation -  CHA2DS2VASC score 6 - Continue bisoprolol for rate control. -  INR therapeutic.  Sinus pause, asymptomatic Appreciate cardiology follow-up, no further pauses overnight but now in Afib with RVR. Discussed with Dr. Debara Pickett, restarted beta blocker.   Day of Discharge BP 139/69 (BP Location: Left Arm)   Pulse 84   Temp 98.3 F (36.8 C) (Oral)   Resp 16   Ht 4' 11.06" (1.5 m)   Wt 69.4 kg (153 lb)   SpO2 97%   BMI 30.84 kg/m   Physical Exam: General: Alert and awake oriented x3 not in any acute distress. HEENT: anicteric sclera, pupils reactive to light and accommodation CVS: S1-S2 clear no murmur rubs or gallops Chest: clear to auscultation bilaterally, no wheezing rales or rhonchi Abdomen: soft nontender, nondistended, normal bowel sounds Extremities: no cyanosis, clubbing or edema noted bilaterally Neuro: Cranial nerves II-XII intact, no focal neurological deficits   The results of significant diagnostics from this hospitalization (including imaging, microbiology, ancillary and laboratory) are listed below for reference.    LAB RESULTS: Basic Metabolic Panel:  Recent Labs Lab 06/27/16 0427 06/28/16 0804  NA 138 136  K 4.5 4.3  CL 108 104  CO2 23 21*  GLUCOSE 101* 99  BUN 16  14  CREATININE 1.20* 1.01*  CALCIUM 8.8* 9.0  MG 2.0  --    Liver Function Tests:  Recent Labs Lab 06/22/16 1221 06/23/16 0434  AST 29 26  ALT 22 20  ALKPHOS 79 72  BILITOT 0.5 0.7  PROT 7.3 6.8  ALBUMIN 3.0* 2.7*   No results for input(s): LIPASE, AMYLASE in the last 168 hours. No results for input(s): AMMONIA in the last 168 hours. CBC:  Recent Labs Lab 06/25/16 0921  06/27/16 0427 06/28/16 0804  WBC 7.2  < > 6.1 6.1  NEUTROABS 5.6  --   --   --   HGB 10.0*  < > 9.9* 9.9*  HCT 30.1*  < > 30.2* 30.0*  MCV 80.1  < > 82.1 82.4  PLT 215  < > 221 119*  < > = values in this interval not displayed. Cardiac Enzymes: No results for input(s): CKTOTAL, CKMB, CKMBINDEX, TROPONINI in the last 168 hours. BNP: Invalid input(s): POCBNP CBG: No results for input(s): GLUCAP in the last 168 hours.  Significant Diagnostic Studies:  No results found.  TEE Study Conclusions  - Left ventricle: The cavity size  was normal. Wall thickness was   normal. Systolic function was normal. The estimated ejection   fraction was in the range of 55% to 60%. - Aortic valve: Normal appearing tissue AVR. - Mitral valve: Tissue MVR with small vegetation on one of the   leaflets. No significant MR Diastolic gradients elevated at HR   100 bpm in afib   but no MS by pressure half time. Valve area by pressure   half-time: 2.47 cm^2. - Left atrium: The atrium was severely dilated. No evidence of   thrombus in the atrial cavity or appendage. - Right atrium: No evidence of thrombus in the atrial cavity or   appendage. - Atrial septum: No defect or patent foramen ovale was identified.   Echo contrast study showed no right-to-left atrial level shunt,   following an increase in RA pressure induced by provocative   maneuvers. - Tricuspid valve: There was moderate regurgitation. - Recommendations: Small vegetation on tissue MVR Not high embolic   potenial with no significant MR or annular involvement  Would Rx   for 6 weeks   and consider repeating TEE.  Recommendations:  Small vegetation on tissue MVR Not high embolic potenial with no significant MR or annular involvement Would Rx for 6 weeks and consider repeating TEE.   Disposition and Follow-up: Discharge Instructions    Diet - low sodium heart healthy    Complete by:  As directed    Discharge instructions    Complete by:  As directed    PT/INR on Monday. CBC, CMET, PT/INR weekly, fax results to PCP   Discharge instructions    Complete by:  As directed    Continue antibiotics till March 12th. Check PT/INR on Monday 2/5. Check CBC, CMET, PT/INR weekly for 6 weeks while on antibiotics   Increase activity slowly    Complete by:  As directed    Increase activity slowly    Complete by:  As directed        DISPOSITION: home with home health    [place], MD. Schedule an appointment as soon as possible for a visit in 2 week(s).   Specialty:  Internal Medicine Contact information: [place] [address] [phone]        Scharlene Gloss, MD. Schedule an appointment as soon as possible for a visit in 4 week(s).   Specialty:  Infectious Diseases Contact information: [address] [address] [phone]        Kate Sable, MD. Schedule an appointment as soon as possible for a visit in 4 week(s).   Specialty:  Cardiology Contact information: [place] [address] [phone]            Time spent on Discharge: 35 mins   Signed:   Maloni Musleh M.D. [facility] 06/28/2016, 6:36 PM Pager: [phone]

## 2016-06-28 NOTE — Progress Notes (Signed)
Peripherally Inserted Central Catheter/Midline Placement  The IV Nurse has discussed with the patient and/or persons authorized to consent for the patient, the purpose of this procedure and the potential benefits and risks involved with this procedure.  The benefits include less needle sticks, lab draws from the catheter, and the patient may be discharged home with the catheter. Risks include, but not limited to, infection, bleeding, blood clot (thrombus formation), and puncture of an artery; nerve damage and irregular heartbeat and possibility to perform a PICC exchange if needed/ordered by physician.  Alternatives to this procedure were also discussed.  Bard Power PICC patient education guide, fact sheet on infection prevention and patient information card has been provided to patient /or left at bedside.   Consent obtained with interpretor  Via phone. Mitzi Hansen [phone]. PICC/Midline Placement Documentation  PICC Single Lumen 23/76/28 PICC Right Basilic 37 cm 0 cm (Active)  Indication for Insertion or Continuance of Line Home intravenous therapies (PICC only) 06/28/2016  3:00 PM  Exposed Catheter (cm) 0 cm 06/28/2016  3:00 PM  Dressing Change Due 07/05/16 06/28/2016  3:00 PM       Frances Maywood 06/28/2016, 3:24 PM

## 2016-06-28 NOTE — Progress Notes (Signed)
ANTICOAGULATION CONSULT NOTE - Follow Up Consult  Pharmacy Consult for Coumadin Indication: atrial fibrillation  Allergies  Allergen Reactions  . Hydralazine Hcl Other (See Comments)    Headache     Patient Measurements: Height: 4' 11.06" (150 cm) Weight: 153 lb (69.4 kg) IBW/kg (Calculated) : 43.33  Vital Signs: Temp: 98.3 F (36.8 C) (02/02 0507) Temp Source: Oral (02/02 0507) BP: 139/69 (02/02 0507) Pulse Rate: 84 (02/02 0507)  Labs:  Recent Labs  06/26/16 0820 06/27/16 0427 06/28/16 0804  HGB 9.7* 9.9* 9.9*  HCT 28.8* 30.2* 30.0*  PLT 204 221 119*  LABPROT 23.4* 22.3* 20.1*  INR 2.04 1.93 1.69  CREATININE 0.98 1.20* 1.01*    Estimated Creatinine Clearance: 37.7 mL/min (by C-G formula based on SCr of 1.01 mg/dL (H)).   Medications:  Scheduled:  . atorvastatin  10 mg Oral q1800  . bisoprolol  2.5 mg Oral Daily  . cefTRIAXone (ROCEPHIN)  IV  2 g Intravenous Q24H  . furosemide  5 mg Oral Daily  . gentamicin  60 mg Intravenous Q24H  . ramipril  5 mg Oral Daily  . sodium chloride  1,000 mL Intravenous Once  . sodium chloride flush  3 mL Intravenous Q12H  . Warfarin - Pharmacist Dosing Inpatient   Does not apply q1800    Assessment: 83yo female with AFib, admitted on 06/22/16 with supratherapeutic INR &last dose PTA on 1/26.  INR on admit was SUPRAtherapeutic at 3.92. Coumadin was held until 1/29 after INR decreased to therapeutic range.  INR has decreased to 1.69 today. Coumadin dose not given yesterday. Hgb  low/stable, pltc decreased to 119k. No bleeding noted. Dr. Tana Coast plans to discharge patient today after PICC line place for IV antibiotics.  PTA coumadin dose:  1.25mg  qTThSS alternating with 2.5mg  MWF (~1.8 qday or 12.5mg /per week ).    Goal of Therapy:  INR 2-3 Monitor platelets by anticoagulation protocol: Yes   Plan:  Coumadin 2.5mg  x 1 now, None tonight. ( discussed with RN)  Daily INR Watch for s/s of bleeding   Thank you for allowing  pharmacy to be part of this patients care team. Nicole Cella, RPh Clinical Pharmacist Pager: [phone] (M-F) [place] [phone] 06/28/2016 11:21 AM

## 2016-06-28 NOTE — Progress Notes (Signed)
Dr. Tana Coast paged to clarify tele orders. Patient wearing tele and no orders in at this time

## 2016-06-30 LAB — CULTURE, BLOOD (ROUTINE X 2)
CULTURE: NO GROWTH
CULTURE: NO GROWTH

## 2016-07-08 ENCOUNTER — Observation Stay (HOSPITAL_COMMUNITY)
Admission: EM | Admit: 2016-07-08 | Discharge: 2016-07-09 | Disposition: A | Discharge status: Home / Self Care | Payer: Self-pay | Attending: Internal Medicine | Admitting: Internal Medicine

## 2016-07-08 ENCOUNTER — Encounter (HOSPITAL_COMMUNITY): Payer: Self-pay | Admitting: Emergency Medicine

## 2016-07-08 ENCOUNTER — Emergency Department (HOSPITAL_COMMUNITY): Payer: Self-pay

## 2016-07-08 DIAGNOSIS — Z951 Presence of aortocoronary bypass graft: Secondary | ICD-10-CM | POA: Insufficient documentation

## 2016-07-08 DIAGNOSIS — Z823 Family history of stroke: Secondary | ICD-10-CM

## 2016-07-08 DIAGNOSIS — I482 Chronic atrial fibrillation, unspecified: Secondary | ICD-10-CM

## 2016-07-08 DIAGNOSIS — T50905A Adverse effect of unspecified drugs, medicaments and biological substances, initial encounter: Secondary | ICD-10-CM | POA: Diagnosis present

## 2016-07-08 DIAGNOSIS — B955 Unspecified streptococcus as the cause of diseases classified elsewhere: Secondary | ICD-10-CM

## 2016-07-08 DIAGNOSIS — N179 Acute kidney failure, unspecified: Secondary | ICD-10-CM | POA: Insufficient documentation

## 2016-07-08 DIAGNOSIS — N289 Disorder of kidney and ureter, unspecified: Secondary | ICD-10-CM

## 2016-07-08 DIAGNOSIS — I130 Hypertensive heart and chronic kidney disease with heart failure and stage 1 through stage 4 chronic kidney disease, or unspecified chronic kidney disease: Secondary | ICD-10-CM | POA: Insufficient documentation

## 2016-07-08 DIAGNOSIS — Z952 Presence of prosthetic heart valve: Secondary | ICD-10-CM | POA: Insufficient documentation

## 2016-07-08 DIAGNOSIS — Y712 Prosthetic and other implants, materials and accessory cardiovascular devices associated with adverse incidents: Secondary | ICD-10-CM

## 2016-07-08 DIAGNOSIS — I330 Acute and subacute infective endocarditis: Secondary | ICD-10-CM | POA: Insufficient documentation

## 2016-07-08 DIAGNOSIS — E7800 Pure hypercholesterolemia, unspecified: Secondary | ICD-10-CM | POA: Insufficient documentation

## 2016-07-08 DIAGNOSIS — E785 Hyperlipidemia, unspecified: Secondary | ICD-10-CM | POA: Insufficient documentation

## 2016-07-08 DIAGNOSIS — I38 Endocarditis, valve unspecified: Secondary | ICD-10-CM

## 2016-07-08 DIAGNOSIS — F4323 Adjustment disorder with mixed anxiety and depressed mood: Principal | ICD-10-CM | POA: Insufficient documentation

## 2016-07-08 DIAGNOSIS — Z79899 Other long term (current) drug therapy: Secondary | ICD-10-CM | POA: Insufficient documentation

## 2016-07-08 DIAGNOSIS — Z8673 Personal history of transient ischemic attack (TIA), and cerebral infarction without residual deficits: Secondary | ICD-10-CM | POA: Insufficient documentation

## 2016-07-08 DIAGNOSIS — I10 Essential (primary) hypertension: Secondary | ICD-10-CM | POA: Diagnosis present

## 2016-07-08 DIAGNOSIS — Z7901 Long term (current) use of anticoagulants: Secondary | ICD-10-CM | POA: Insufficient documentation

## 2016-07-08 DIAGNOSIS — T7840XA Allergy, unspecified, initial encounter: Secondary | ICD-10-CM

## 2016-07-08 DIAGNOSIS — B954 Other streptococcus as the cause of diseases classified elsewhere: Secondary | ICD-10-CM

## 2016-07-08 DIAGNOSIS — R21 Rash and other nonspecific skin eruption: Secondary | ICD-10-CM | POA: Insufficient documentation

## 2016-07-08 DIAGNOSIS — N189 Chronic kidney disease, unspecified: Secondary | ICD-10-CM | POA: Insufficient documentation

## 2016-07-08 DIAGNOSIS — T361X5A Adverse effect of cephalosporins and other beta-lactam antibiotics, initial encounter: Secondary | ICD-10-CM

## 2016-07-08 DIAGNOSIS — T826XXA Infection and inflammatory reaction due to cardiac valve prosthesis, initial encounter: Secondary | ICD-10-CM

## 2016-07-08 DIAGNOSIS — Z8249 Family history of ischemic heart disease and other diseases of the circulatory system: Secondary | ICD-10-CM

## 2016-07-08 DIAGNOSIS — I2510 Atherosclerotic heart disease of native coronary artery without angina pectoris: Secondary | ICD-10-CM | POA: Insufficient documentation

## 2016-07-08 DIAGNOSIS — Z881 Allergy status to other antibiotic agents status: Secondary | ICD-10-CM

## 2016-07-08 DIAGNOSIS — I509 Heart failure, unspecified: Secondary | ICD-10-CM | POA: Insufficient documentation

## 2016-07-08 HISTORY — DX: Pneumonia, unspecified organism: J18.9

## 2016-07-08 HISTORY — DX: Presence of prosthetic heart valve: Z95.2

## 2016-07-08 HISTORY — DX: Adverse effect of unspecified drugs, medicaments and biological substances, initial encounter: T50.905A

## 2016-07-08 HISTORY — DX: Personal history of other medical treatment: Z92.89

## 2016-07-08 HISTORY — DX: Rheumatoid arthritis, unspecified: M06.9

## 2016-07-08 HISTORY — DX: Unspecified streptococcus as the cause of diseases classified elsewhere: B95.5

## 2016-07-08 HISTORY — DX: Gastro-esophageal reflux disease without esophagitis: K21.9

## 2016-07-08 HISTORY — DX: Essential (primary) hypertension: I10

## 2016-07-08 HISTORY — DX: Dizziness and giddiness: R42

## 2016-07-08 HISTORY — DX: Pure hypercholesterolemia, unspecified: E78.00

## 2016-07-08 HISTORY — DX: Unspecified fracture of left foot, initial encounter for closed fracture: S92.902A

## 2016-07-08 HISTORY — DX: Depression, unspecified: F32.A

## 2016-07-08 HISTORY — DX: Major depressive disorder, single episode, unspecified: F32.9

## 2016-07-08 HISTORY — DX: Endocarditis, valve unspecified: I38

## 2016-07-08 HISTORY — DX: Anxiety disorder, unspecified: F41.9

## 2016-07-08 HISTORY — DX: Acute and subacute infective endocarditis: I33.0

## 2016-07-08 LAB — PROTIME-INR
INR: 1.71
PROTHROMBIN TIME: 20.3 s — AB (ref 11.4–15.2)

## 2016-07-08 LAB — BASIC METABOLIC PANEL
ANION GAP: 15 (ref 5–15)
BUN: 18 mg/dL (ref 6–20)
CHLORIDE: 102 mmol/L (ref 101–111)
CO2: 19 mmol/L — AB (ref 22–32)
Calcium: 9.2 mg/dL (ref 8.9–10.3)
Creatinine, Ser: 1.43 mg/dL — ABNORMAL HIGH (ref 0.44–1.00)
GFR calc non Af Amer: 34 mL/min — ABNORMAL LOW (ref >60)
GFR, EST AFRICAN AMERICAN: 39 mL/min — AB (ref >60)
Glucose, Bld: 110 mg/dL — ABNORMAL HIGH (ref 65–99)
Potassium: 3.6 mmol/L (ref 3.5–5.1)
Sodium: 136 mmol/L (ref 135–145)

## 2016-07-08 LAB — HEPATIC FUNCTION PANEL
ALBUMIN: 3.3 g/dL — AB (ref 3.5–5.0)
ALK PHOS: 63 U/L (ref 38–126)
ALT: 17 U/L (ref 14–54)
AST: 30 U/L (ref 15–41)
BILIRUBIN TOTAL: 0.9 mg/dL (ref 0.3–1.2)
Bilirubin, Direct: 0.2 mg/dL (ref 0.1–0.5)
Indirect Bilirubin: 0.7 mg/dL (ref 0.3–0.9)
Total Protein: 7.5 g/dL (ref 6.5–8.1)

## 2016-07-08 LAB — CBC WITH DIFFERENTIAL/PLATELET
BASOS ABS: 0 10*3/uL (ref 0.0–0.1)
BASOS PCT: 0 %
Eosinophils Absolute: 0.1 10*3/uL (ref 0.0–0.7)
Eosinophils Relative: 3 %
HEMATOCRIT: 32.2 % — AB (ref 36.0–46.0)
HEMOGLOBIN: 10.7 g/dL — AB (ref 12.0–15.0)
LYMPHS PCT: 14 %
Lymphs Abs: 0.6 10*3/uL — ABNORMAL LOW (ref 0.7–4.0)
MCH: 26.6 pg (ref 26.0–34.0)
MCHC: 33.2 g/dL (ref 30.0–36.0)
MCV: 80.1 fL (ref 78.0–100.0)
MONOS PCT: 10 %
Monocytes Absolute: 0.4 10*3/uL (ref 0.1–1.0)
NEUTROS ABS: 3.1 10*3/uL (ref 1.7–7.7)
NEUTROS PCT: 73 %
Platelets: 187 10*3/uL (ref 150–400)
RBC: 4.02 MIL/uL (ref 3.87–5.11)
RDW: 14.8 % (ref 11.5–15.5)
WBC: 4.3 10*3/uL (ref 4.0–10.5)

## 2016-07-08 LAB — URINALYSIS, ROUTINE W REFLEX MICROSCOPIC
BILIRUBIN URINE: NEGATIVE
GLUCOSE, UA: NEGATIVE mg/dL
KETONES UR: NEGATIVE mg/dL
NITRITE: NEGATIVE
PH: 5 (ref 5.0–8.0)
Protein, ur: NEGATIVE mg/dL
SPECIFIC GRAVITY, URINE: 1.009 (ref 1.005–1.030)

## 2016-07-08 LAB — I-STAT TROPONIN, ED
TROPONIN I, POC: 0.01 ng/mL (ref 0.00–0.08)
Troponin i, poc: 0.01 ng/mL (ref 0.00–0.08)

## 2016-07-08 LAB — CK: Total CK: 46 U/L (ref 38–234)

## 2016-07-08 LAB — LIPASE, BLOOD: Lipase: 68 U/L — ABNORMAL HIGH (ref 11–51)

## 2016-07-08 LAB — SEDIMENTATION RATE: SED RATE: 60 mm/h — AB (ref 0–22)

## 2016-07-08 LAB — C-REACTIVE PROTEIN: CRP: 1.3 mg/dL — ABNORMAL HIGH (ref <1.0)

## 2016-07-08 MED ORDER — TRAMADOL HCL 50 MG PO TABS
50.0000 mg | ORAL_TABLET | Freq: Four times a day (QID) | ORAL | Status: DC | PRN
  Administered 2016-07-08 – 2016-07-09 (×3): 50 mg via ORAL
  Filled 2016-07-08 (×3): qty 1

## 2016-07-08 MED ORDER — ACETAMINOPHEN 650 MG RE SUPP: 650.0000 mg | Freq: Four times a day (QID) | RECTAL | Status: DC | PRN

## 2016-07-08 MED ORDER — PROMETHAZINE HCL 25 MG PO TABS: 12.5000 mg | ORAL_TABLET | Freq: Four times a day (QID) | ORAL | Status: DC | PRN

## 2016-07-08 MED ORDER — ACETAMINOPHEN 325 MG PO TABS: 650.0000 mg | ORAL_TABLET | Freq: Four times a day (QID) | ORAL | Status: DC | PRN

## 2016-07-08 MED ORDER — FUROSEMIDE 10 MG/ML PO SOLN
5.0000 mg | Freq: Every day | ORAL | Status: DC
  Administered 2016-07-08 – 2016-07-09 (×3): 5 mg via ORAL
  Filled 2016-07-08 (×2): qty 0.5

## 2016-07-08 MED ORDER — GENTAMICIN SULFATE 40 MG/ML IJ SOLN
60.0000 mg | INTRAMUSCULAR | Status: DC
  Administered 2016-07-08 – 2016-07-09 (×2): 60 mg via INTRAVENOUS
  Filled 2016-07-08 (×2): qty 1.5

## 2016-07-08 MED ORDER — PENICILLIN G POTASSIUM 20000000 UNITS IJ SOLR
6.0000 10*6.[IU] | Freq: Two times a day (BID) | INTRAVENOUS | Status: DC
  Administered 2016-07-08 – 2016-07-09 (×3): 6 10*6.[IU] via INTRAVENOUS
  Filled 2016-07-08 (×5): qty 6

## 2016-07-08 MED ORDER — FENTANYL CITRATE (PF) 100 MCG/2ML IJ SOLN
50.0000 ug | Freq: Once | INTRAMUSCULAR | Status: AC
  Administered 2016-07-08: 50 ug via INTRAVENOUS
  Filled 2016-07-08: qty 2

## 2016-07-08 MED ORDER — LORAZEPAM 0.5 MG PO TABS
0.5000 mg | ORAL_TABLET | Freq: Once | ORAL | Status: AC
  Administered 2016-07-08: 0.5 mg via ORAL
  Filled 2016-07-08: qty 1

## 2016-07-08 MED ORDER — SODIUM CHLORIDE 0.9 % IV BOLUS (SEPSIS)
1000.0000 mL | Freq: Once | INTRAVENOUS | Status: AC
  Administered 2016-07-08: 1000 mL via INTRAVENOUS

## 2016-07-08 MED ORDER — ATORVASTATIN CALCIUM 10 MG PO TABS
10.0000 mg | ORAL_TABLET | Freq: Every day | ORAL | Status: DC
  Administered 2016-07-08 – 2016-07-09 (×2): 10 mg via ORAL
  Filled 2016-07-08 (×2): qty 1

## 2016-07-08 MED ORDER — MORPHINE SULFATE (PF) 4 MG/ML IV SOLN
4.0000 mg | Freq: Once | INTRAVENOUS | Status: AC
  Administered 2016-07-08: 4 mg via INTRAVENOUS
  Filled 2016-07-08: qty 1

## 2016-07-08 MED ORDER — WARFARIN - PHARMACIST DOSING INPATIENT
Freq: Every day | Status: DC
  Administered 2016-07-09: 18:00:00

## 2016-07-08 MED ORDER — WARFARIN SODIUM 2.5 MG PO TABS
2.5000 mg | ORAL_TABLET | Freq: Once | ORAL | Status: AC
  Administered 2016-07-08: 2.5 mg via ORAL
  Filled 2016-07-08: qty 1

## 2016-07-08 MED ORDER — OXYCODONE-ACETAMINOPHEN 5-325 MG PO TABS: 1.0000 | ORAL_TABLET | Freq: Once | ORAL | Status: DC

## 2016-07-08 MED ORDER — GENTAMICIN IN SALINE 1.2-0.9 MG/ML-% IV SOLN
60.0000 mg | Freq: Once | INTRAVENOUS | Status: DC
  Filled 2016-07-08: qty 50

## 2016-07-08 MED ORDER — BISOPROLOL FUMARATE 5 MG PO TABS
2.5000 mg | ORAL_TABLET | Freq: Every day | ORAL | Status: DC
  Administered 2016-07-08 – 2016-07-09 (×2): 2.5 mg via ORAL
  Filled 2016-07-08 (×2): qty 0.5

## 2016-07-08 MED ORDER — ENSURE ENLIVE PO LIQD
237.0000 mL | Freq: Two times a day (BID) | ORAL | Status: DC
  Administered 2016-07-09 (×2): 237 mL via ORAL

## 2016-07-08 MED ORDER — DEXTROSE 5 % IV SOLN
2.0000 g | Freq: Once | INTRAVENOUS | Status: AC
  Administered 2016-07-08: 2 g via INTRAVENOUS
  Filled 2016-07-08: qty 2

## 2016-07-08 MED ORDER — SODIUM CHLORIDE 0.9% FLUSH
10.0000 mL | INTRAVENOUS | Status: DC | PRN
  Administered 2016-07-09 (×2): 10 mL
  Filled 2016-07-08 (×2): qty 40

## 2016-07-08 MED ORDER — TORSEMIDE 5 MG PO TABS
2.5000 mg | ORAL_TABLET | Freq: Every day | ORAL | Status: DC
  Filled 2016-07-08: qty 0.5

## 2016-07-08 MED ORDER — PENICILLIN G POT IN DEXTROSE 60000 UNIT/ML IV SOLN
3.0000 10*6.[IU] | INTRAVENOUS | Status: DC
  Filled 2016-07-08 (×3): qty 50

## 2016-07-08 NOTE — ED Provider Notes (Signed)
[facility] DEPT Provider Note   CSN: [id number] Arrival date & time: 07/08/16  0258     History   Chief Complaint Chief Complaint  Patient presents with  . Chest Pain  . Abdominal Pain  . Shortness of Breath  . Leg Pain    HPI Alexis [name] is a 83 y.o. female.  HPI  This is an 83 year old female with a history of atrial fibrillation, coronary artery disease, CVA and recent history of endocarditis on home ceftriaxone who presents with complaints. Daughter reports that after administering her antibiotics last night she developed pain all over including chest pain, abdominal pain, shortness breath, leg pain. She's never had a reaction like this before. Daughter reports that it got progressively worse throughout the night. She gave her 2 Tylenol with no relief. Current pain is 8 out of 10. No nausea, vomiting, diarrhea. He reports compliance with antibodies home. No recent fevers, cough.  Past Medical History:  Diagnosis Date  . Atrial fibrillation ([place])   . CAD (coronary artery disease) of artery bypass graft   . CVA (cerebral vascular accident) [facility])     Patient Active Problem List   Diagnosis Date Noted  . Bacteremia due to Streptococcus 06/23/2016  . Atrial fibrillation ([place])   . Positive blood cultures 06/22/2016  . Shoulder blade pain 06/06/2016  . Eczema 06/06/2016  . Chest wall pain 06/06/2016  . Gastroenteritis 06/20/2014  . Orthostatic hypotension 06/20/2014  . Hyponatremia 06/20/2014  . Memory loss 08/16/2013  . Encounter for therapeutic drug monitoring 07/06/2013  . Long term current use of anticoagulant therapy 06/17/2012  . Encounter for monitoring coumadin therapy 03/30/2012  . Essential hypertension 05/31/2008  . Coronary atherosclerosis 05/31/2008  . Congestive heart failure ([place]) 05/31/2008  . CONSTIPATION 05/31/2008  . MYALGIA 05/31/2008    Past Surgical History:  Procedure Laterality Date  . CORONARY ARTERY BYPASS GRAFT    . MITRAL  VALVE REPAIR (MV)/CORONARY ARTERY BYPASS GRAFTING (CABG)     2009 [facility]  . TEE WITHOUT CARDIOVERSION N/A 06/26/2016   Procedure: TRANSESOPHAGEAL ECHOCARDIOGRAM (TEE);  Surgeon: Josue Hector, MD;  Location: [facility] ENDOSCOPY;  Service: Cardiovascular;  Laterality: N/A;    OB History    No data available       Home Medications    Prior to Admission medications   Medication Sig Start Date End Date Taking? Authorizing Provider  acetaminophen (TYLENOL) 500 MG tablet Take 500-1,000 mg by mouth 2 (two) times daily as needed for fever.     Historical Provider, MD  atorvastatin (LIPITOR) 10 MG tablet Take 1 tablet (10 mg total) by mouth daily. 09/07/14   [name], MD  bisoprolol (ZEBETA) 5 MG tablet Take 0.5 tablets (2.5 mg total) by mouth daily. Patient taking differently: Take 1.25 mg by mouth daily.  08/29/14   [name], MD  cefTRIAXone 2 g in dextrose 5 % 50 mL Inject 2 g into the vein daily. Stop date on March 12th 06/29/16   Ripudeep Krystal Eaton, MD  Cholecalciferol (VITAMIN D3) 2000 units capsule Take 1 capsule (2,000 Units total) by mouth daily. 06/06/16   [name], MD  gentamicin (GARAMYCIN) 1.2-0.9 MG/ML-% Inject 50 mLs (60 mg total) into the vein daily. Stop date March 12th 06/28/16   Ripudeep Krystal Eaton, MD  loratadine (CLARITIN) 10 MG tablet Take 1 tablet (10 mg total) by mouth daily. Patient not taking: Reported on 06/22/2016 06/06/16 06/06/17  [name], MD  ramipril (ALTACE) 5 MG capsule  Take 1 capsule (5 mg total) by mouth daily. Patient not taking: Reported on 06/22/2016 04/10/12   [name], MD  torsemide (DEMADEX) 5 MG tablet Take 0.5 tablets (2.5 mg total) by mouth daily. 06/06/16   [name], MD  triamcinolone ointment (KENALOG) 0.5 % Apply 1 application topically 2 (two) times daily. Patient not taking: Reported on 06/22/2016 06/06/16 06/06/17  [name], MD  UNABLE TO FIND [place] (from [place])/Choline alphoscerate:  Take 400 mg by mouth two times a day    Historical Provider, MD  warfarin (COUMADIN) 2.5 MG tablet Take as directed by anticoagulation clinic Patient taking differently: Take 1.25-2.5 mg by mouth See admin instructions. 1.25 mg in the evening on Sun/Tues/Thurs/Sat and 2.5 mg on Mon/Wed/Fri 09/07/14   [name], MD    Family History Family History  Problem Relation Age of Onset  . Stroke Maternal Grandmother   . CAD Neg Hx     Social History Social History  Substance Use Topics  . Smoking status: Never Smoker  . Smokeless tobacco: Never Used  . Alcohol use No     Allergies   Hydralazine hcl   Review of Systems Review of Systems  Constitutional: Negative for fever.  Respiratory: Negative for cough.   Cardiovascular: Positive for chest pain.  Gastrointestinal: Positive for abdominal pain. Negative for diarrhea, nausea and vomiting.  Musculoskeletal:       Bilateral leg pain  Skin: Negative for rash.  All other systems reviewed and are negative.    Physical Exam Updated Vital Signs BP 158/88   Pulse 84   Temp 98.4 F (36.9 C) (Oral)   Resp 19   Ht 4' 10.66" (1.49 m)   Wt 160 lb (72.6 kg)   SpO2 96%   BMI 32.69 kg/m   Physical Exam  Constitutional: She is oriented to person, place, and time. No distress.  Elderly, no acute distress  HENT:  Head: Normocephalic and atraumatic.  Cardiovascular: Normal rate, regular rhythm and normal heart sounds.   Pulmonary/Chest: Effort normal and breath sounds normal. No respiratory distress. She has no wheezes. She exhibits tenderness.  Abdominal: Soft. Bowel sounds are normal. There is tenderness. There is no guarding.  Epigastric tenderness to palpation without rebound or guarding  Musculoskeletal: She exhibits no edema.  PICC right upper extremity, clean dry and intact Tenderness palpation bilateral lower extremities without swelling or deformity noted  Neurological: She is alert and oriented to person, place,  and time.  Skin: Skin is warm and dry.  Psychiatric: She has a normal mood and affect.  Nursing note and vitals reviewed.    ED Treatments / Results  Labs (all labs ordered are listed, but only abnormal results are displayed) Labs Reviewed  CBC WITH DIFFERENTIAL/PLATELET - Abnormal; Notable for the following:       Result Value   Hemoglobin 10.7 (*)    HCT 32.2 (*)    Lymphs Abs 0.6 (*)    All other components within normal limits  BASIC METABOLIC PANEL - Abnormal; Notable for the following:    CO2 19 (*)    Glucose, Bld 110 (*)    Creatinine, Ser 1.43 (*)    GFR calc non Af Amer 34 (*)    GFR calc Af Amer 39 (*)    All other components within normal limits  LIPASE, BLOOD - Abnormal; Notable for the following:    Lipase 68 (*)    All other components within normal limits  URINALYSIS,  ROUTINE W REFLEX MICROSCOPIC - Abnormal; Notable for the following:    APPearance HAZY (*)    Hgb urine dipstick SMALL (*)    Leukocytes, UA SMALL (*)    Bacteria, UA RARE (*)    Squamous Epithelial / LPF 0-5 (*)    All other components within normal limits  PROTIME-INR - Abnormal; Notable for the following:    Prothrombin Time 20.3 (*)    All other components within normal limits  HEPATIC FUNCTION PANEL - Abnormal; Notable for the following:    Albumin 3.3 (*)    All other components within normal limits  CK  I-STAT TROPOININ, ED    EKG  EKG Interpretation  Date/Time:  Monday July 08 2016 05:12:50 EST Ventricular Rate:  105 PR Interval:    QRS Duration: 87 QT Interval:  386 QTC Calculation: 511 R Axis:   90 Text Interpretation:  Atrial fibrillation Borderline right axis deviation Prolonged QT interval Confirmed by Dina Rich  MD, Makhari Dovidio ([id number]) on 07/08/2016 6:06:21 AM       Radiology Dg Abdomen Acute W/chest  Result Date: 07/08/2016 CLINICAL DATA:  Epigastric pain tonight. EXAM: DG ABDOMEN ACUTE W/ 1V CHEST COMPARISON:  Chest 06/28/2016 FINDINGS: Postoperative changes in  the mediastinum. Right PICC line with tip over the low SVC region. Shallow inspiration. Heart size and pulmonary vascularity are normal. Linear scarring or atelectasis in the mid lungs, similar to prior study. Calcified and tortuous aorta. Prominent upper abdominal vascular calcifications. Scattered gas and stool throughout the colon. No small or large bowel distention. No free intra- abdominal air. No abnormal air-fluid levels. Calcifications in the pelvis consistent with fibroids. Vascular calcifications in the aorta and iliac arteries. Degenerative changes in the spine and hips. IMPRESSION: No evidence of active pulmonary disease. Nonobstructive bowel gas pattern. Vascular calcifications. Calcified fibroids. Electronically Signed   By: Lucienne Capers M.D.   On: 07/08/2016 06:03    Procedures Procedures (including critical care time)  Medications Ordered in ED Medications  cefTRIAXone (ROCEPHIN) 2 g in dextrose 5 % 50 mL IVPB (2 g Intravenous New Bag/Given 07/08/16 0737)  fentaNYL (SUBLIMAZE) injection 50 mcg (50 mcg Intravenous Given 07/08/16 0538)  sodium chloride 0.9 % bolus 1,000 mL (0 mLs Intravenous Stopped 07/08/16 0730)  morphine 4 MG/ML injection 4 mg (4 mg Intravenous Given 07/08/16 2330)     Initial Impression / Assessment and Plan / ED Course  I have reviewed the triage vital signs and the nursing notes.  Pertinent labs & imaging results that were available during my care of the patient were reviewed by me and considered in my medical decision making (see chart for details).    Patient presents with body aches, chest pain, shortness of breath, leg pain. She relates this to an infusion of Rocephin last night. She previously has tolerated this. Nontoxic. Vital signs are reassuring. No signs or symptoms of anaphylaxis. Lab work obtained including CK. Patient does appear mildly dehydrated.    I have reviewed the patient's chart. She grew out strep viridans. Due for 6 weeks of  antibiotics. Did not get her gentamicin last night after this presumed reaction to medication. Workup is largely unremarkable otherwise. I did discuss with infectious disease. They recommend challenging the patient with Rocephin at a slower rate patient is amenable. Patient is agreeable to plan.  Final Clinical Impressions(s) / ED Diagnoses   Final diagnoses:  None    New Prescriptions New Prescriptions   No medications on file     [place]  Gwinda Passe, MD 07/08/16 [phone]

## 2016-07-08 NOTE — Progress Notes (Signed)
Pharmacy Antibiotic Note  Alexis [name] is a 83 y.o. female admitted on 07/08/2016 with possible reaction to ceftriaxone (generalized ill feeling) vs psychosomatic given increased stress over anticipated mortality.  Pharmacy has been consulted for gentamicin and Strep viridans bacteremia/endocarditis dosing.   Recent admission from 06/22/16 to 06/28/16 for Strep viridans bacteremia and prosthetic valve endocarditis. She was discharged home on ceftriaxone + gentamicin for 6 weeks (through 08/05/16) per ID recommendations.  Per [facility], patient missed gentamicin dose on 2/11 (confirmed with daughter). Last labs were from 07/04/16: SCr 1.28, gent trough 0.5.  SCr is elevated today at 1.43 - will watch closely and adjust antibiotics as indicated.  Plan: Penicillin G 6 million units IV q12h (over continuous infusion) Gentamicin 60 mg IV q24h Monitor renal function, clinical progress, gent trough at Css  Height: 4' 10.66" (149 cm) Weight: 160 lb (72.6 kg) IBW/kg (Calculated) : 42.42  Temp (24hrs), Avg:98.2 F (36.8 C), Min:98 F (36.7 C), Max:98.4 F (36.9 C)   Recent Labs Lab 07/08/16 0525  WBC 4.3  CREATININE 1.43*    Estimated Creatinine Clearance: 27 mL/min (by C-G formula based on SCr of 1.43 mg/dL (H)).    Allergies  Allergen Reactions  . Hydralazine Hcl Other (See Comments)    Headache     Antimicrobials this admission: CTX 1/28 >> 2/11 Gent 1/31 >>    *missed dose 2/11 PCN G 2/12 >>  Dose adjustments this admission: 2/8 gent tr 0.5 (SCr 1.28) per [facility] - no dose adj  Microbiology results: 1/25 BCx x1 - S. viridans (s-rocephin, lvq;  I-PCN) 1/27 BCx x2 - 2/2 S. viridans 1/30 BCx x 2- NEG  Thank you for allowing pharmacy to be a part of this patient's care.  Renold Genta, PharmD, BCPS Clinical Pharmacist Phone for today - [place] - [phone] 07/08/2016 11:11 AM

## 2016-07-08 NOTE — Progress Notes (Signed)
ANTICOAGULATION CONSULT NOTE - Initial Consult  Pharmacy Consult for warfarin Indication: atrial fibrillation  Allergies  Allergen Reactions  . Hydralazine Hcl Other (See Comments)    Headache     Patient Measurements: Height: 4' 10.66" (149 cm) Weight: 160 lb (72.6 kg) IBW/kg (Calculated) : 42.42   Vital Signs: Temp: 98 F (36.7 C) (02/12 1053) Temp Source: Oral (02/12 0515) BP: 139/67 (02/12 1053) Pulse Rate: 101 (02/12 1053)  Labs:  Recent Labs  07/08/16 0525  HGB 10.7*  HCT 32.2*  PLT 187  LABPROT 20.3*  INR 1.71  CREATININE 1.43*  CKTOTAL 46    Estimated Creatinine Clearance: 27 mL/min (by C-G formula based on SCr of 1.43 mg/dL (H)).   Medical History: Past Medical History:  Diagnosis Date  . Atrial fibrillation ([place])   . CAD (coronary artery disease) of artery bypass graft   . CVA (cerebral vascular accident) [facility])     Assessment: 49 YOF with history of AFib and mitral valve replacement on warfarin. She was recently discharged on 2/2 on IV antibiotics for strep bacteremia and mitral valve vegetations (plan for antibiotics through 08/05/16 per discharge summary)  Home warfarin dose was 1.25mg  daily except 2.5mg  on MWF per last admission medication history. She has not had an anticoagulation visit since discharge that is on file.  INR 1.71 on admission today. Hgb 10.7, plts 187- no bleeding noted.   Goal of Therapy:  INR 2-3 Monitor platelets by anticoagulation protocol: Yes   Plan:  -warfarin 2.5mg  po x1 tonight -daily INR and CBC -follow s/s bleeding  Chiquitta Matty D. Jakaylah Schlafer, PharmD, BCPS Clinical Pharmacist Pager: [phone] 07/08/2016 11:14 AM

## 2016-07-08 NOTE — Progress Notes (Signed)
[facility]  Readmitted Active pt with [facility].  [facility] was providing HHRN and Home Infusion Pharmacy servcies at home.  Pt was receiving Rocephin 2 Grams IV Daily to finish on 08-05-16.  [facility] team will follow pt while an inpatient to support transition home upon DC.  If patient discharges after hours, please call [phone].   Larry Sierras 07/08/2016, 12:52 PM

## 2016-07-08 NOTE — Consult Note (Signed)
Date of Admission:  07/08/2016  Date of Consult:  07/08/2016  Reason for Consult: Reported adverse reaction to ceftriaxone in patient with prosthetic valve endocarditis Referring Physician: Dr. Alvino Chapel   HPI: Alexis [name] is an 83 y.o. 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 Reportedly since Saturday she began having chest pain knee pain and multiple joint pains along with abdominal pain during infusion of her ceftriaxone. Apparently yesterday became so severe that she was yelling and screaming in pain and nearly passed out. Family called EMS who arrived and felt that she was not suffering from any significant adverse reaction. Ultimately brought her to the emergency department where repeat dose of ceftriaxone was given with the patient again developing chest pain abdominal pain and multiple joint pains. She has a rash on her back which has happened in the past over the summer does not really appear related to antibiotics.  She has no other significant rashes and no systemic signs of his severe allergic reaction.   Past Medical History:  Diagnosis Date  . Atrial fibrillation ([place])   . CAD (coronary artery disease) of artery bypass graft   . CVA (cerebral vascular accident) [facility])     Past Surgical History:  Procedure Laterality Date  . CORONARY ARTERY BYPASS GRAFT    . MITRAL VALVE REPAIR (MV)/CORONARY ARTERY BYPASS GRAFTING (CABG)     2009 [facility]  . TEE WITHOUT CARDIOVERSION N/A 06/26/2016   Procedure: TRANSESOPHAGEAL ECHOCARDIOGRAM (TEE);  Surgeon: Josue Hector, MD;  Location: [facility] ENDOSCOPY;  Service: Cardiovascular;  Laterality: N/A;    Social History:  reports that she has never smoked. She has never used smokeless tobacco. She reports that she does not drink alcohol or use drugs.   Family History  Problem Relation Age of Onset    . Stroke Maternal Grandmother   . CAD Neg Hx     Allergies  Allergen Reactions  . Ceftriaxone Other (See Comments)    Per MD progress note 2/12:  Infusion reaction.  Denies other associated symptoms such as diaphoresis, nausea, vomiting, dysuria, diarrhea, lower abdominal pain, received palpitations, swelling, difficulty breathing  . Hydralazine Hcl Other (See Comments)    Headache      Medications: I have reviewed patients current medications as documented in Epic Anti-infectives    Start     Dose/Rate Route Frequency Ordered Stop   07/08/16 1230  penicillin G potassium 6 Million Units in dextrose 5 % 500 mL continuous infusion     6 Million Units 41.7 mL/hr over 12 Hours Intravenous Every 12 hours 07/08/16 1143     07/08/16 1200  penicillin G potassium 3 Million Units in dextrose 50m IVPB  Status:  Discontinued     3 Million Units 100 mL/hr over 30 Minutes Intravenous Every 4 hours 07/08/16 1052 07/08/16 1143   07/08/16 1200  gentamicin (GARAMYCIN) 60 mg in dextrose 5 % 100 mL IVPB     60 mg 101.5 mL/hr over 60 Minutes Intravenous Every 24 hours 07/08/16 1131     07/08/16 0715  cefTRIAXone (ROCEPHIN) 2 g in dextrose 5 % 50 mL IVPB     2 g 100 mL/hr over 30 Minutes Intravenous  Once 07/08/16 0703 07/08/16 0815   07/08/16 0700  gentamicin (GARAMYCIN) IVPB 60 mg  Status:  Discontinued     60 mg 100 mL/hr over 30 Minutes Intravenous  Once  07/08/16 0639 07/08/16 0657         ROS:  as in HPI otherwise remainder of 12 point Review of Systems is negative   Blood pressure 139/67, pulse (!) 101, temperature 98 F (36.7 C), resp. rate 18, height 4' 10.66" (1.49 m), weight 160 lb (72.6 kg), SpO2 96 %. General: Alert and awake, Anxious appearing. HEENT: anicteric sclera,  EOMI, oropharynx clear and without exudate Cardiovascular: Tachycardic rate, normal r, Pulmonary: clear to auscultation bilaterally, no wheezing, rales or rhonchi Gastrointestinal: soft nontender,  nondistended, normal bowel sounds, Musculoskeletal: no  clubbing or edema noted bilaterally Skin, she has a rash on her back and see picture 07/08/2016:     Neuro: nonfocal, strength and sensation intact   Results for orders placed or performed during the hospital encounter of 07/08/16 (from the past 48 hour(s))  CBC with Differential     Status: Abnormal   Collection Time: 07/08/16  5:25 AM  Result Value Ref Range   WBC 4.3 4.0 - 10.5 K/uL   RBC 4.02 3.87 - 5.11 MIL/uL   Hemoglobin 10.7 (L) 12.0 - 15.0 g/dL   HCT 32.2 (L) 36.0 - 46.0 %   MCV 80.1 78.0 - 100.0 fL   MCH 26.6 26.0 - 34.0 pg   MCHC 33.2 30.0 - 36.0 g/dL   RDW 14.8 11.5 - 15.5 %   Platelets 187 150 - 400 K/uL   Neutrophils Relative % 73 %   Neutro Abs 3.1 1.7 - 7.7 K/uL   Lymphocytes Relative 14 %   Lymphs Abs 0.6 (L) 0.7 - 4.0 K/uL   Monocytes Relative 10 %   Monocytes Absolute 0.4 0.1 - 1.0 K/uL   Eosinophils Relative 3 %   Eosinophils Absolute 0.1 0.0 - 0.7 K/uL   Basophils Relative 0 %   Basophils Absolute 0.0 0.0 - 0.1 K/uL  Basic metabolic panel     Status: Abnormal   Collection Time: 07/08/16  5:25 AM  Result Value Ref Range   Sodium 136 135 - 145 mmol/L   Potassium 3.6 3.5 - 5.1 mmol/L   Chloride 102 101 - 111 mmol/L   CO2 19 (L) 22 - 32 mmol/L   Glucose, Bld 110 (H) 65 - 99 mg/dL   BUN 18 6 - 20 mg/dL   Creatinine, Ser 1.43 (H) 0.44 - 1.00 mg/dL   Calcium 9.2 8.9 - 10.3 mg/dL   GFR calc non Af Amer 34 (L) >60 mL/min   GFR calc Af Amer 39 (L) >60 mL/min    Comment: (NOTE) The eGFR has been calculated using the CKD EPI equation. This calculation has not been validated in all clinical situations. eGFR's persistently <60 mL/min signify possible Chronic Kidney Disease.    Anion gap 15 5 - 15  Lipase, blood     Status: Abnormal   Collection Time: 07/08/16  5:25 AM  Result Value Ref Range   Lipase 68 (H) 11 - 51 U/L  CK     Status: None   Collection Time: 07/08/16  5:25 AM  Result Value Ref  Range   Total CK 46 38 - 234 U/L  Protime-INR     Status: Abnormal   Collection Time: 07/08/16  5:25 AM  Result Value Ref Range   Prothrombin Time 20.3 (H) 11.4 - 15.2 seconds   INR 1.71   Hepatic function panel     Status: Abnormal   Collection Time: 07/08/16  5:25 AM  Result Value Ref Range   Total Protein 7.5  6.5 - 8.1 g/dL   Albumin 3.3 (L) 3.5 - 5.0 g/dL   AST 30 15 - 41 U/L   ALT 17 14 - 54 U/L   Alkaline Phosphatase 63 38 - 126 U/L   Total Bilirubin 0.9 0.3 - 1.2 mg/dL   Bilirubin, Direct 0.2 0.1 - 0.5 mg/dL   Indirect Bilirubin 0.7 0.3 - 0.9 mg/dL  I-Stat Troponin, ED (not at [facility])     Status: None   Collection Time: 07/08/16  5:28 AM  Result Value Ref Range   Troponin i, poc 0.01 0.00 - 0.08 ng/mL   Comment 3            Comment: Due to the release kinetics of cTnI, a negative result within the first hours of the onset of symptoms does not rule out myocardial infarction with certainty. If myocardial infarction is still suspected, repeat the test at appropriate intervals.   Urinalysis, Routine w reflex microscopic     Status: Abnormal   Collection Time: 07/08/16  5:39 AM  Result Value Ref Range   Color, Urine YELLOW YELLOW   APPearance HAZY (A) CLEAR   Specific Gravity, Urine 1.009 1.005 - 1.030   pH 5.0 5.0 - 8.0   Glucose, UA NEGATIVE NEGATIVE mg/dL   Hgb urine dipstick SMALL (A) NEGATIVE   Bilirubin Urine NEGATIVE NEGATIVE   Ketones, ur NEGATIVE NEGATIVE mg/dL   Protein, ur NEGATIVE NEGATIVE mg/dL   Nitrite NEGATIVE NEGATIVE   Leukocytes, UA SMALL (A) NEGATIVE   RBC / HPF 0-5 0 - 5 RBC/hpf   WBC, UA 0-5 0 - 5 WBC/hpf   Bacteria, UA RARE (A) NONE SEEN   Squamous Epithelial / LPF 0-5 (A) NONE SEEN   Hyaline Casts, UA PRESENT   I-Stat Troponin, ED (not at [facility])     Status: None   Collection Time: 07/08/16  8:29 AM  Result Value Ref Range   Troponin i, poc 0.01 0.00 - 0.08 ng/mL   Comment 3            Comment: Due to the release kinetics of cTnI, a negative  result within the first hours of the onset of symptoms does not rule out myocardial infarction with certainty. If myocardial infarction is still suspected, repeat the test at appropriate intervals.    '@BRIEFLABTABLE' (sdes,specrequest,cult,reptstatus)   ) Recent Results (from the past 720 hour(s))  Blood culture (routine single)     Status: None   Collection Time: 06/20/16  4:37 PM  Result Value Ref Range Status   Culture VIRIDANS STREPTOCOCCUS  Final    Comment: Culture results may be compromised due to an excessive volume of blood received in culture bottles. Gram Stain Report Called to,Read Back By and Verified With: STACI VOICEMAIL DR [facility] OFFICE 06-21-16 1140    Organism ID, Bacteria VIRIDANS STREPTOCOCCUS  Final      Susceptibility   Viridans streptococcus -  (no method available)    PENICILLIN 0.094 Intermediate     CEFTRIAXONE 0.047 Sensitive     LEVOFLOXACIN 0.75 Sensitive   Blood culture (routine x 2)     Status: Abnormal   Collection Time: 06/22/16  1:16 PM  Result Value Ref Range Status   Specimen Description BLOOD RIGHT ANTECUBITAL  Final   Special Requests BOTTLES DRAWN AEROBIC ONLY 6CC  Final   Culture  Setup Time   Final    AEROBIC BOTTLE ONLY GRAM POSITIVE COCCI IN CHAINS CRITICAL RESULT CALLED TO, READ BACK BY AND VERIFIED WITH: TO LBAJBUS(PHARMD)  BY [facility] 06/23/2016 AT 5:24AM    Culture (A)  Final    VIRIDANS STREPTOCOCCUS SUSCEPTIBILITIES PERFORMED ON PREVIOUS CULTURE WITHIN THE LAST 5 DAYS.    Report Status 06/25/2016 FINAL  Final  Blood culture (routine x 2)     Status: Abnormal   Collection Time: 06/22/16  1:22 PM  Result Value Ref Range Status   Specimen Description BLOOD RIGHT HAND  Final   Special Requests BOTTLES DRAWN AEROBIC AND ANAEROBIC 5CC  Final   Culture  Setup Time   Final    IN BOTH AEROBIC AND ANAEROBIC BOTTLES GRAM POSITIVE COCCI IN CHAINS CRITICAL RESULT CALLED TO, READ BACK BY AND VERIFIED WITH: TO  LBAJBUS(PHARMD) BY  TCLEVELAND 06/23/2016 AT 5:24AM    Culture VIRIDANS STREPTOCOCCUS (A)  Final   Report Status 06/25/2016 FINAL  Final   Organism ID, Bacteria VIRIDANS STREPTOCOCCUS  Final      Susceptibility   Viridans streptococcus - MIC*    PENICILLIN Value in next row Intermediate      INTERMEDIATE0.25    CEFTRIAXONE Value in next row Sensitive      SENSITIVE<=0.12    ERYTHROMYCIN Value in next row Sensitive      SENSITIVE<=0.12    LEVOFLOXACIN Value in next row Sensitive      SENSITIVE<=0.12    VANCOMYCIN Value in next row Sensitive      SENSITIVE<=0.12    * VIRIDANS STREPTOCOCCUS  Blood Culture ID Panel (Reflexed)     Status: Abnormal   Collection Time: 06/22/16  1:22 PM  Result Value Ref Range Status   Enterococcus species NOT DETECTED NOT DETECTED Final   Listeria monocytogenes NOT DETECTED NOT DETECTED Final   Staphylococcus species NOT DETECTED NOT DETECTED Final   Staphylococcus aureus NOT DETECTED NOT DETECTED Final   Streptococcus species DETECTED (A) NOT DETECTED Final    Comment: CRITICAL RESULT CALLED TO, READ BACK BY AND VERIFIED WITH: TO LBAJBUS(PHARMD) BY TCLEVELAND 06/23/2016 AT 5:24AM    Streptococcus agalactiae NOT DETECTED NOT DETECTED Final   Streptococcus pneumoniae NOT DETECTED NOT DETECTED Final   Streptococcus pyogenes NOT DETECTED NOT DETECTED Final   Acinetobacter baumannii NOT DETECTED NOT DETECTED Final   Enterobacteriaceae species NOT DETECTED NOT DETECTED Final   Enterobacter cloacae complex NOT DETECTED NOT DETECTED Final   Escherichia coli NOT DETECTED NOT DETECTED Final   Klebsiella oxytoca NOT DETECTED NOT DETECTED Final   Klebsiella pneumoniae NOT DETECTED NOT DETECTED Final   Proteus species NOT DETECTED NOT DETECTED Final   Serratia marcescens NOT DETECTED NOT DETECTED Final   Haemophilus influenzae NOT DETECTED NOT DETECTED Final   Neisseria meningitidis NOT DETECTED NOT DETECTED Final   Pseudomonas aeruginosa NOT DETECTED NOT DETECTED Final   Candida  albicans NOT DETECTED NOT DETECTED Final   Candida glabrata NOT DETECTED NOT DETECTED Final   Candida krusei NOT DETECTED NOT DETECTED Final   Candida parapsilosis NOT DETECTED NOT DETECTED Final   Candida tropicalis NOT DETECTED NOT DETECTED Final  Urine culture     Status: None   Collection Time: 06/22/16  5:39 PM  Result Value Ref Range Status   Specimen Description URINE, RANDOM  Final   Special Requests NONE  Final   Culture NO GROWTH  Final   Report Status 06/24/2016 FINAL  Final  Culture, blood (routine x 2)     Status: None   Collection Time: 06/25/16  5:35 AM  Result Value Ref Range Status   Specimen Description BLOOD LEFT ARM  Final  Special Requests BOTTLES DRAWN AEROBIC AND ANAEROBIC 5CC EACH  Final   Culture NO GROWTH 5 DAYS  Final   Report Status 06/30/2016 FINAL  Final  Culture, blood (routine x 2)     Status: None   Collection Time: 06/25/16  5:37 AM  Result Value Ref Range Status   Specimen Description BLOOD RIGHT ARM  Final   Special Requests BOTTLES DRAWN AEROBIC ONLY 5CC  Final   Culture NO GROWTH 5 DAYS  Final   Report Status 06/30/2016 FINAL  Final     Impression/Recommendation  Active Problems:   Essential hypertension   Medication reaction   Streptococcal endocarditis   Mitral valve replaced   Acute on chronic renal insufficiency   Chronic atrial fibrillation (HCC)   Alexis [name] is a 83 y.o. female with  Prosthetic mitral valve endocarditis with viridans group streptococci with intermediate susceptibility to penicillin, who has had apparent reaction to ceftriaxone though not sure if this is a to reaction or something mediated by her anxiety.  #1 Prosthetic valve endocarditis: Would prefer to be going with a beta-lactam in an aminoglycoside so I will try to see if she can tolerate continuous infusion of penicillin. Will do this along with gentamicin and a complete 6 weeks of total therapy.  #2 alleged reaction to ceftriaxone: Again not clear  what is going on here. Will monitor her response to penicillin perhaps she will have less of a reaction sensitive as a continuous infusion  #3 rash: She was apparently had this before during the summertime is on the back and not located anywhere else in the bodies I doubt is related to antibiotics.    07/08/2016, 12:31 PM   Thank you so much for this interesting consult  [place] [facility] [place] [phone] (pager) [phone] (office) 07/08/2016, 12:31 PM  Rhina [place] 07/08/2016, 12:31 PM

## 2016-07-08 NOTE — ED Notes (Signed)
Attempted report to floor x 1 

## 2016-07-08 NOTE — ED Triage Notes (Signed)
Pt presents from home with daughter who states that patient began experiencing CP, Abd pain, SOB, and leg pain since last night at 6pm when the daughter administered an ABX via PICC for endocarditis; pt recently admitted to hospital and discharged on 06/28/2016

## 2016-07-08 NOTE — ED Notes (Signed)
Patient transported to X-ray with transporter 

## 2016-07-08 NOTE — Progress Notes (Signed)
Roland Prine is a 83 y.o. female patient admitted from ED awake, alert - oriented  X 4 - no acute distress noted.  VSS - Blood pressure 139/67, pulse (!) 101, temperature 98 F (36.7 C), resp. rate 18, height 4' 10.66" (1.49 m), weight 72.6 kg (160 lb), SpO2 96 %.    IV in place, occlusive dsg intact without redness.  Orientation to room, and floor completed with information packet given to patient/family.  Patient declined safety video at this time.  Admission INP armband ID verified with patient/family, and in place.   SR up x 2, fall assessment complete, with patient and family able to verbalize understanding of risk associated with falls, and verbalized understanding to call nsg before up out of bed.  Call light within reach, patient able to voice, and demonstrate understanding.  Skin, clean-dry- intact without evidence of bruising, or skin tears.  Moisture associated damage was noted under the breast and on fold of abdomen. Pt speaks [place], but patient daughter  was in room to translate form english to [place] language.   Will cont to eval and treat per MD orders.  Dorris Carnes, RN 07/08/2016 12:38 PM

## 2016-07-08 NOTE — H&P (Signed)
History and Physical    Alexis [name] OBS:[id number] DOB: [birth year] DOA: 07/08/2016  PCP: [name], MD Patient coming from: Home  Chief Complaint: Reaction to medication.  HPI: Alexis [name] is a 83 y.o. female with medical history significant of atrial fibrillation, CAD, CVA, mitral valve replacement, CABG recently admitted for strep bacteremia with mitral valve vegetations. Discharged on 06/28/2016 with continued IV antibiotic regimen at home. Since time of discharge patient reports being in her normal state of health until 07/06/2016 when she developed generalized ill feeling with intermittent symmetrical extremity pain that occurred partway through her gentamicin infusion. The symptoms have continued since that time with sporadic involvement of extremities and back. No aggravating or alleviating factors other than antibiotic infusions. Patients symptoms became worse with her ceftriaxone infusion on 07/07/2016. Overnight patient woke from sleep at approximately 03:00 and EMS was called. Patient brought to the emergency room. In the ED patient was again given her IV infusions causing worsening generalized pain. Patient denies other associated symptoms such as diaphoresis, nausea, vomiting, dysuria, diarrhea, lower abdominal pain, received palpitations, swelling, difficulty breathing.   History obtained through interpretive services of patient's daughter. Patient speaks polish.   Patient has been under an increasing amount of stress due to self induced worry that she will die soon. Patient was told that time for mitral valve replacement that the life expectancy for patients with her condition is approximately 9 years. Patient states that she is now more than 9 years past from her mitral valve replacement and expects that this is the beginning of the end and she will die soon.     ED Course: attempted ceftriaxone administration in the ED with adverse reaction to the medicine. At no  with some improvement generalized pain.   Review of Systems: As per HPI otherwise 10 point review of systems negative.   Ambulatory Status: Restricted by cardiac umbilicus.   Past Medical History:  Diagnosis Date  . Atrial fibrillation ([place])   . CAD (coronary artery disease) of artery bypass graft   . CVA (cerebral vascular accident) [facility])     Past Surgical History:  Procedure Laterality Date  . CORONARY ARTERY BYPASS GRAFT    . MITRAL VALVE REPAIR (MV)/CORONARY ARTERY BYPASS GRAFTING (CABG)     2009 [facility]  . TEE WITHOUT CARDIOVERSION N/A 06/26/2016   Procedure: TRANSESOPHAGEAL ECHOCARDIOGRAM (TEE);  Surgeon: Josue Hector, MD;  Location: [facility] ENDOSCOPY;  Service: Cardiovascular;  Laterality: N/A;    Social History   Social History  . Marital status: Married    Spouse name: N/A  . Number of children: N/A  . Years of education: N/A   Occupational History  . Not on file.   Social History Main Topics  . Smoking status: Never Smoker  . Smokeless tobacco: Never Used  . Alcohol use No  . Drug use: No  . Sexual activity: Not on file   Other Topics Concern  . Not on file   Social History Narrative  . No narrative on file    Allergies  Allergen Reactions  . Hydralazine Hcl Other (See Comments)    Headache     Family History  Problem Relation Age of Onset  . Stroke Maternal Grandmother   . CAD Neg Hx     Prior to Admission medications   Medication Sig Start Date End Date Taking? Authorizing Provider  acetaminophen (TYLENOL) 500 MG tablet Take 500-1,000 mg by mouth 2 (two) times daily as needed for fever.  Historical Provider, MD  atorvastatin (LIPITOR) 10 MG tablet Take 1 tablet (10 mg total) by mouth daily. 09/07/14   [name], MD  bisoprolol (ZEBETA) 5 MG tablet Take 0.5 tablets (2.5 mg total) by mouth daily. Patient taking differently: Take 1.25 mg by mouth daily.  08/29/14   [name], MD  cefTRIAXone 2 g in dextrose 5 % 50 mL  Inject 2 g into the vein daily. Stop date on March 12th 06/29/16   Ripudeep Krystal Eaton, MD  Cholecalciferol (VITAMIN D3) 2000 units capsule Take 1 capsule (2,000 Units total) by mouth daily. 06/06/16   [name], MD  gentamicin (GARAMYCIN) 1.2-0.9 MG/ML-% Inject 50 mLs (60 mg total) into the vein daily. Stop date March 12th 06/28/16   Ripudeep Krystal Eaton, MD  loratadine (CLARITIN) 10 MG tablet Take 1 tablet (10 mg total) by mouth daily. Patient not taking: Reported on 06/22/2016 06/06/16 06/06/17  [name], MD  ramipril (ALTACE) 5 MG capsule Take 1 capsule (5 mg total) by mouth daily. Patient not taking: Reported on 06/22/2016 04/10/12   [name], MD  torsemide (DEMADEX) 5 MG tablet Take 0.5 tablets (2.5 mg total) by mouth daily. 06/06/16   [name], MD  triamcinolone ointment (KENALOG) 0.5 % Apply 1 application topically 2 (two) times daily. Patient not taking: Reported on 06/22/2016 06/06/16 06/06/17  [name], MD  UNABLE TO FIND [place] (from [place])/Choline alphoscerate: Take 400 mg by mouth two times a day    Historical Provider, MD  warfarin (COUMADIN) 2.5 MG tablet Take as directed by anticoagulation clinic Patient taking differently: Take 1.25-2.5 mg by mouth See admin instructions. 1.25 mg in the evening on Sun/Tues/Thurs/Sat and 2.5 mg on Mon/Wed/Fri 09/07/14   [name], MD    Physical Exam: Vitals:   07/08/16 0845 07/08/16 0900 07/08/16 0930 07/08/16 1000  BP: 176/90 177/90 165/73 171/79  Pulse: 94 94 110 91  Resp: (!) 34 (!) 28 (!) 31 23  Temp:      TempSrc:      SpO2: 96% (!) 87% (!) 80% 93%  Weight:      Height:         General: Appears somewhat anxious, resting in bed.  Eyes:  PERRL, EOMI, normal lids, iris ENT: Edentulous, moist mucous membranes  Neck:  no LAD, masses or thyromegaly Cardiovascular:  2/6 systolic murmur, irregularly irregular rhythm . No LE edema.  Respiratory:  CTA bilaterally, no w/r/r. Normal respiratory  effort. Abdomen:  soft, ntnd, NABS Skin:  no rash or induration seen on limited exam Musculoskeletal:  grossly normal tone BUE/BLE, good ROM, no bony abnormality Psychiatric:  grossly normal mood and affect, speech fluent and appropriate, AOx3 Neurologic:  CN 2-12 grossly intact, moves all extremities in coordinated fashion, sensation intact  Labs on Admission: I have personally reviewed following labs and imaging studies  CBC:  Recent Labs Lab 07/08/16 0525  WBC 4.3  NEUTROABS 3.1  HGB 10.7*  HCT 32.2*  MCV 80.1  PLT 287   Basic Metabolic Panel:  Recent Labs Lab 07/08/16 0525  NA 136  K 3.6  CL 102  CO2 19*  GLUCOSE 110*  BUN 18  CREATININE 1.43*  CALCIUM 9.2   GFR: Estimated Creatinine Clearance: 27 mL/min (by C-G formula based on SCr of 1.43 mg/dL (H)). Liver Function Tests:  Recent Labs Lab 07/08/16 0525  AST 30  ALT 17  ALKPHOS 63  BILITOT 0.9  PROT 7.5  ALBUMIN  3.3*    Recent Labs Lab 07/08/16 0525  LIPASE 68*   No results for input(s): AMMONIA in the last 168 hours. Coagulation Profile:  Recent Labs Lab 07/08/16 0525  INR 1.71   Cardiac Enzymes:  Recent Labs Lab 07/08/16 0525  CKTOTAL 46   BNP (last 3 results) No results for input(s): PROBNP in the last 8760 hours. HbA1C: No results for input(s): HGBA1C in the last 72 hours. CBG: No results for input(s): GLUCAP in the last 168 hours. Lipid Profile: No results for input(s): CHOL, HDL, LDLCALC, TRIG, CHOLHDL, LDLDIRECT in the last 72 hours. Thyroid Function Tests: No results for input(s): TSH, T4TOTAL, FREET4, T3FREE, THYROIDAB in the last 72 hours. Anemia Panel: No results for input(s): VITAMINB12, FOLATE, FERRITIN, TIBC, IRON, RETICCTPCT in the last 72 hours. Urine analysis:    Component Value Date/Time   COLORURINE YELLOW 07/08/2016 0539   APPEARANCEUR HAZY (A) 07/08/2016 0539   LABSPEC 1.009 07/08/2016 0539   PHURINE 5.0 07/08/2016 0539   GLUCOSEU NEGATIVE 07/08/2016  0539   GLUCOSEU NEGATIVE 06/20/2016 1637   HGBUR SMALL (A) 07/08/2016 0539   BILIRUBINUR NEGATIVE 07/08/2016 0539   KETONESUR NEGATIVE 07/08/2016 0539   PROTEINUR NEGATIVE 07/08/2016 0539   UROBILINOGEN 1.0 06/20/2016 1637   NITRITE NEGATIVE 07/08/2016 0539   LEUKOCYTESUR SMALL (A) 07/08/2016 0539    Creatinine Clearance: Estimated Creatinine Clearance: 27 mL/min (by C-G formula based on SCr of 1.43 mg/dL (H)).  Sepsis Labs: @LABRCNTIP (procalcitonin:4,lacticidven:4) )No results found for this or any previous visit (from the past 240 hour(s)).   Radiological Exams on Admission: Dg Abdomen Acute W/chest  Result Date: 07/08/2016 CLINICAL DATA:  Epigastric pain tonight. EXAM: DG ABDOMEN ACUTE W/ 1V CHEST COMPARISON:  Chest 06/28/2016 FINDINGS: Postoperative changes in the mediastinum. Right PICC line with tip over the low SVC region. Shallow inspiration. Heart size and pulmonary vascularity are normal. Linear scarring or atelectasis in the mid lungs, similar to prior study. Calcified and tortuous aorta. Prominent upper abdominal vascular calcifications. Scattered gas and stool throughout the colon. No small or large bowel distention. No free intra- abdominal air. No abnormal air-fluid levels. Calcifications in the pelvis consistent with fibroids. Vascular calcifications in the aorta and iliac arteries. Degenerative changes in the spine and hips. IMPRESSION: No evidence of active pulmonary disease. Nonobstructive bowel gas pattern. Vascular calcifications. Calcified fibroids. Electronically Signed   By: Lucienne Capers M.D.   On: 07/08/2016 06:03    EKG: Independently reviewed. A. fib, no ACS, prolonged QT   Assessment/Plan Active Problems:   Essential hypertension   Medication reaction   Streptococcal endocarditis   Mitral valve replaced   Acute on chronic renal insufficiency   Chronic atrial fibrillation (HCC)   Genaralized intermittent migratory pain and malaise: Unsure if due to a  medication reaction (allergy???) versus psychosomatic/conversion given patient's increased stress over anticipated mortality. Of note patient discharged on 06/28/2016 after treatment for strep endocarditis. Doing well without symptoms until 2 days prior to admission.  - Psych consult  - Change medications as below  - PT/OT  (further workup warranted if other musculoskeletal abnormalities found.) - Tramadol prn (No NSAIDs until renal function improves).  Strep V. Bacteremia/Endocarditis: TEE showing MV vegitations on 1/31. Started on ABX on 06/22/16. CTX and Gent since discahrge on 06/28/16. Possible reaction to medication.  - appreciate ID consult for ABX regimen change and duration (originally set to complete on 08/05/16 - 6 week total) - repeat BCX  AoCKD: Cr 1.43. Baseline 1.0.  - IVF -  BMET in am  HTN: - continue bisoprolol  Afib: CHA2DS2-VASc = 6 - continue bisoprolol, coumadin  CHF: compensated. EF 54% w/ mild diastolic dysfunction - Strict I/O, dly wts - Continue torsemide, bblocker  HD: - ciontinue lipitor    DVT prophylaxis: Coumadin  Code Status: FULL  Family Communication: daughter  Disposition Plan: pending improvement  Consults called: Psych  Admission status: observation    Sari Cogan J MD [facility]  If 7PM-7AM, please contact night-coverage [email or web address] Password [facility]  07/08/2016, 10:23 AM

## 2016-07-08 NOTE — ED Provider Notes (Signed)
  Physical Exam  BP 177/90   Pulse 94   Temp 98.4 F (36.9 C) (Oral)   Resp (!) 28   Ht 4' 10.66" (1.49 m)   Wt 160 lb (72.6 kg)   SpO2 (!) 87%   BMI 32.69 kg/m   Physical Exam  ED Course  Procedures  MDM Patient had possible reaction to Rocephin last night. Chest pain abdominal pain and leg pain. Labs reassuring, however she had another episode here during our trial infusion. Same symptoms. I discussed with Dr. Megan Salon last night. Today talked with Dr. Lucianne Lei dam and will admit patient for monitoring and medication adjustment.       Davonna Belling, MD 07/08/16 [phone]

## 2016-07-08 NOTE — ED Notes (Signed)
Pt has one person personal bag one pair of shoes and one coat on bed ready for transport

## 2016-07-08 NOTE — ED Notes (Signed)
Pt family member called out, stating pt had pain all over. Stopped abx, EDP Dr. Alvino Chapel aware.

## 2016-07-09 DIAGNOSIS — Z888 Allergy status to other drugs, medicaments and biological substances status: Secondary | ICD-10-CM

## 2016-07-09 DIAGNOSIS — T887XXA Unspecified adverse effect of drug or medicament, initial encounter: Secondary | ICD-10-CM

## 2016-07-09 DIAGNOSIS — Z79899 Other long term (current) drug therapy: Secondary | ICD-10-CM

## 2016-07-09 DIAGNOSIS — F4323 Adjustment disorder with mixed anxiety and depressed mood: Secondary | ICD-10-CM

## 2016-07-09 HISTORY — DX: Adjustment disorder with mixed anxiety and depressed mood: F43.23

## 2016-07-09 LAB — COMPREHENSIVE METABOLIC PANEL
ALK PHOS: 60 U/L (ref 38–126)
ALT: 15 U/L (ref 14–54)
AST: 24 U/L (ref 15–41)
Albumin: 2.9 g/dL — ABNORMAL LOW (ref 3.5–5.0)
Anion gap: 9 (ref 5–15)
BILIRUBIN TOTAL: 0.6 mg/dL (ref 0.3–1.2)
BUN: 13 mg/dL (ref 6–20)
CALCIUM: 8.5 mg/dL — AB (ref 8.9–10.3)
CO2: 23 mmol/L (ref 22–32)
CREATININE: 1.15 mg/dL — AB (ref 0.44–1.00)
Chloride: 98 mmol/L — ABNORMAL LOW (ref 101–111)
GFR calc Af Amer: 51 mL/min — ABNORMAL LOW (ref >60)
GFR, EST NON AFRICAN AMERICAN: 44 mL/min — AB (ref >60)
GLUCOSE: 128 mg/dL — AB (ref 65–99)
Potassium: 3.6 mmol/L (ref 3.5–5.1)
Sodium: 130 mmol/L — ABNORMAL LOW (ref 135–145)
TOTAL PROTEIN: 7 g/dL (ref 6.5–8.1)

## 2016-07-09 LAB — CBC
HEMATOCRIT: 29.5 % — AB (ref 36.0–46.0)
Hemoglobin: 9.7 g/dL — ABNORMAL LOW (ref 12.0–15.0)
MCH: 26.7 pg (ref 26.0–34.0)
MCHC: 32.9 g/dL (ref 30.0–36.0)
MCV: 81.3 fL (ref 78.0–100.0)
PLATELETS: 168 10*3/uL (ref 150–400)
RBC: 3.63 MIL/uL — ABNORMAL LOW (ref 3.87–5.11)
RDW: 15.1 % (ref 11.5–15.5)
WBC: 5.9 10*3/uL (ref 4.0–10.5)

## 2016-07-09 LAB — HIV ANTIBODY (ROUTINE TESTING W REFLEX): HIV SCREEN 4TH GENERATION: NONREACTIVE

## 2016-07-09 LAB — PROTIME-INR
INR: 2.08
PROTHROMBIN TIME: 23.7 s — AB (ref 11.4–15.2)

## 2016-07-09 MED ORDER — GABAPENTIN 100 MG PO CAPS: 100.0000 mg | ORAL_CAPSULE | Freq: Two times a day (BID) | ORAL | Status: DC

## 2016-07-09 MED ORDER — GENTAMICIN IV (FOR PTA / DISCHARGE USE ONLY): INTRAVENOUS | 0 refills | Status: DC

## 2016-07-09 MED ORDER — WARFARIN SODIUM 2.5 MG PO TABS
2.5000 mg | ORAL_TABLET | Freq: Once | ORAL | Status: AC
  Administered 2016-07-09: 2.5 mg via ORAL
  Filled 2016-07-09: qty 1

## 2016-07-09 MED ORDER — PENICILLIN G POTASSIUM IV (FOR PTA / DISCHARGE USE ONLY): 12.0000 10*6.[IU] | Freq: Every day | INTRAVENOUS | 0 refills | Status: DC

## 2016-07-09 MED ORDER — TORSEMIDE 5 MG PO TABS: 2.5000 mg | ORAL_TABLET | ORAL | 0 refills | Status: DC

## 2016-07-09 MED ORDER — GABAPENTIN 100 MG PO CAPS
100.0000 mg | ORAL_CAPSULE | Freq: Two times a day (BID) | ORAL | Status: DC
  Administered 2016-07-09: 100 mg via ORAL
  Filled 2016-07-09: qty 1

## 2016-07-09 MED ORDER — RAMIPRIL 5 MG PO CAPS: 2.5000 mg | ORAL_CAPSULE | Freq: Every day | ORAL | 0 refills | Status: DC

## 2016-07-09 MED ORDER — RAMIPRIL 2.5 MG PO CAPS: 2.5000 mg | ORAL_CAPSULE | Freq: Every day | ORAL | 0 refills | Status: DC

## 2016-07-09 MED ORDER — ENSURE ENLIVE PO LIQD: 237.0000 mL | Freq: Two times a day (BID) | ORAL | 12 refills | Status: DC

## 2016-07-09 MED ORDER — LORAZEPAM 0.5 MG PO TABS: 0.5000 mg | ORAL_TABLET | Freq: Every evening | ORAL | 0 refills | Status: DC | PRN

## 2016-07-09 MED ORDER — GABAPENTIN 100 MG PO CAPS: 100.0000 mg | ORAL_CAPSULE | Freq: Two times a day (BID) | ORAL | 0 refills | Status: DC

## 2016-07-09 MED ORDER — GENTAMICIN IV (FOR PTA / DISCHARGE USE ONLY): 60.0000 mg | INTRAVENOUS | 0 refills | Status: DC

## 2016-07-09 MED ORDER — HEPARIN SOD (PORK) LOCK FLUSH 100 UNIT/ML IV SOLN
250.0000 [IU] | INTRAVENOUS | Status: AC | PRN
  Administered 2016-07-09: 250 [IU]

## 2016-07-09 MED ORDER — PENICILLIN G POTASSIUM IV (FOR PTA / DISCHARGE USE ONLY): 6.0000 10*6.[IU] | Freq: Two times a day (BID) | INTRAVENOUS | 0 refills | Status: DC

## 2016-07-09 NOTE — Consult Note (Signed)
[facility] Face-to-Face Psychiatry Consult   Reason for Consult:  Conversion disorder Referring Physician:  Dr. Erlinda Hong Patient Identification: Alexis [name] MRN:  [id number] Principal Diagnosis: Adjustment disorder with mixed anxiety and depressed mood Diagnosis:   Patient Active Problem List   Diagnosis Date Noted  . Medication reaction [T88.7XXA] 07/08/2016  . Streptococcal endocarditis [I33.0, B95.5] 07/08/2016  . Mitral valve replaced [Z95.2] 07/08/2016  . Acute on chronic renal insufficiency [N28.9, N18.9] 07/08/2016  . Chronic atrial fibrillation ([place]) [I48.2] 07/08/2016  . 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([place]) [R00.6XXA]   . Bacteremia due to Streptococcus [R78.81] 06/23/2016  . Atrial fibrillation ([place]) [I48.91]   . Positive blood cultures [R78.81] 06/22/2016  . Shoulder blade pain [M89.8X1] 06/06/2016  . Eczema [L30.9] 06/06/2016  . Chest wall pain [R07.89] 06/06/2016  . Gastroenteritis [K52.9] 06/20/2014  . Orthostatic hypotension [I95.1] 06/20/2014  . Hyponatremia [E87.1] 06/20/2014  . Memory loss [R41.3] 08/16/2013  . Encounter for therapeutic drug monitoring [Z51.81] 07/06/2013  . Long term current use of anticoagulant therapy [Z79.01] 06/17/2012  . Encounter for monitoring coumadin therapy [Z51.81, Z79.01] 03/30/2012  . Essential hypertension [I10] 05/31/2008  . Coronary atherosclerosis [I25.10] 05/31/2008  . Congestive heart failure ([place]) [I50.9] 05/31/2008  . CONSTIPATION [K59.00] 05/31/2008  . Myalgia [M79.1] 05/31/2008    Total Time spent with patient: 1 hour  Subjective:   Alexis [name] is a 83 y.o. female patient admitted with generalized pain and weakness and thoughts of death.  HPI:  Alexis [name] is a 83 y.o. female with medical history significant of atrial fibrillation, CAD, CVA, mitral valve replacement, CABG recently admitted for strep bacteremia with mitral valve vegetations. Discharged on 06/28/2016 with continued IV antibiotic regimen at home.  Since time of discharge patient reports being in her normal state of health until 07/06/2016 when she developed generalized ill feeling with intermittent symmetrical extremity pain that occurred partway through her gentamicin infusion.   Patient seen, chart reviewed and case discussed with the staff RN and patient daughter on the phone. Patient has been interviewed with the help of translator from [place] to [place]. Patient reported she has been anxious for the last few days because she has been thinking something wrong discontinue happen to her based on the information provided by a cardi surgeon about 8 years ago that her while replacement will be function only 8 years. Patient daughter reported that she received lorazepam 0.5 mg yesterday which actually stopped her shaking immediately. She is also willing to start medication gabapentin 100 mg twice daily to control her anxiety without worrying about dependency on the benzodiazepines. This and denied symptoms of depression, mania, psychosis and she has no loss of memory. Patient has intact cognitions during my evaluation.  Past Psychiatric History:none reported  Risk to Self: Is patient at risk for suicide?: No Risk to Others:   Prior Inpatient Therapy:   Prior Outpatient Therapy:    Past Medical History:  Past Medical History:  Diagnosis Date  . Anxiety   . Atrial fibrillation ([place])   . CAD (coronary artery disease) of artery bypass graft   . CVA (cerebral vascular accident) ([place]) 05/2000   "memory issues, dizziness since" (07/08/2016  . Depression   . Dizzinesses    "often since [facility] 2009" (07/08/2016)  . Endocarditis dx'd 05/2016  . Foot fracture, left    "no OR; from fall"  . GERD (gastroesophageal reflux disease)    hx  . High cholesterol   . History of blood transfusion ~ 1960  . Hypertension   .  Pneumonia 1-2 X  . Polyarthritis rheumatica (HCC)     Past Surgical History:  Procedure Laterality Date  . AORTIC VALVE REPLACEMENT   03/2008  . CARDIAC VALVE REPLACEMENT    . CATARACT EXTRACTION W/ INTRAOCULAR LENS IMPLANT Left   . CORONARY ANGIOPLASTY  2009  . CORONARY ARTERY BYPASS GRAFT  03/2008  . MITRAL VALVE REPAIR (MV)/CORONARY ARTERY BYPASS GRAFTING (CABG)  03/2008   2009 [facility]  . TEE WITHOUT CARDIOVERSION N/A 06/26/2016   Procedure: TRANSESOPHAGEAL ECHOCARDIOGRAM (TEE);  Surgeon: Josue Hector, MD;  Location: [facility] ENDOSCOPY;  Service: Cardiovascular;  Laterality: N/A;   Family History:  Family History  Problem Relation Age of Onset  . Stroke Maternal Grandmother   . CAD Neg Hx    Family Psychiatric  History: Patient has no known family history of mental illness. Patient stated she has been living in [place] for the last 5 years straight and before that she has been in and out of the country but never actually learn to speak [place].    Social History:  History  Alcohol Use No     History  Drug Use No    Social History   Social History  . Marital status: Widowed    Spouse name: N/A  . Number of children: N/A  . Years of education: N/A   Social History Main Topics  . Smoking status: Never Smoker  . Smokeless tobacco: Never Used  . Alcohol use No  . Drug use: No  . Sexual activity: Not Asked   Other Topics Concern  . None   Social History Narrative  . None   Additional Social History:    Allergies:   Allergies  Allergen Reactions  . Ceftriaxone Other (See Comments)    Per MD progress note 2/12:  Infusion reaction.  Denies other associated symptoms such as diaphoresis, nausea, vomiting, dysuria, diarrhea, lower abdominal pain, received palpitations, swelling, difficulty breathing  . Hydralazine Hcl Other (See Comments)    Headache     Labs:  Results for orders placed or performed during the hospital encounter of 07/08/16 (from the past 48 hour(s))  CBC with Differential     Status: Abnormal   Collection Time: 07/08/16  5:25 AM  Result Value Ref Range   WBC 4.3  4.0 - 10.5 K/uL   RBC 4.02 3.87 - 5.11 MIL/uL   Hemoglobin 10.7 (L) 12.0 - 15.0 g/dL   HCT 32.2 (L) 36.0 - 46.0 %   MCV 80.1 78.0 - 100.0 fL   MCH 26.6 26.0 - 34.0 pg   MCHC 33.2 30.0 - 36.0 g/dL   RDW 14.8 11.5 - 15.5 %   Platelets 187 150 - 400 K/uL   Neutrophils Relative % 73 %   Neutro Abs 3.1 1.7 - 7.7 K/uL   Lymphocytes Relative 14 %   Lymphs Abs 0.6 (L) 0.7 - 4.0 K/uL   Monocytes Relative 10 %   Monocytes Absolute 0.4 0.1 - 1.0 K/uL   Eosinophils Relative 3 %   Eosinophils Absolute 0.1 0.0 - 0.7 K/uL   Basophils Relative 0 %   Basophils Absolute 0.0 0.0 - 0.1 K/uL  Basic metabolic panel     Status: Abnormal   Collection Time: 07/08/16  5:25 AM  Result Value Ref Range   Sodium 136 135 - 145 mmol/L   Potassium 3.6 3.5 - 5.1 mmol/L   Chloride 102 101 - 111 mmol/L   CO2 19 (L) 22 - 32 mmol/L   Glucose,  Bld 110 (H) 65 - 99 mg/dL   BUN 18 6 - 20 mg/dL   Creatinine, Ser 1.43 (H) 0.44 - 1.00 mg/dL   Calcium 9.2 8.9 - 10.3 mg/dL   GFR calc non Af Amer 34 (L) >60 mL/min   GFR calc Af Amer 39 (L) >60 mL/min    Comment: (NOTE) The eGFR has been calculated using the CKD EPI equation. This calculation has not been validated in all clinical situations. eGFR's persistently <60 mL/min signify possible Chronic Kidney Disease.    Anion gap 15 5 - 15  Lipase, blood     Status: Abnormal   Collection Time: 07/08/16  5:25 AM  Result Value Ref Range   Lipase 68 (H) 11 - 51 U/L  CK     Status: None   Collection Time: 07/08/16  5:25 AM  Result Value Ref Range   Total CK 46 38 - 234 U/L  Protime-INR     Status: Abnormal   Collection Time: 07/08/16  5:25 AM  Result Value Ref Range   Prothrombin Time 20.3 (H) 11.4 - 15.2 seconds   INR 1.71   Hepatic function panel     Status: Abnormal   Collection Time: 07/08/16  5:25 AM  Result Value Ref Range   Total Protein 7.5 6.5 - 8.1 g/dL   Albumin 3.3 (L) 3.5 - 5.0 g/dL   AST 30 15 - 41 U/L   ALT 17 14 - 54 U/L   Alkaline Phosphatase 63  38 - 126 U/L   Total Bilirubin 0.9 0.3 - 1.2 mg/dL   Bilirubin, Direct 0.2 0.1 - 0.5 mg/dL   Indirect Bilirubin 0.7 0.3 - 0.9 mg/dL  I-Stat Troponin, ED (not at [facility])     Status: None   Collection Time: 07/08/16  5:28 AM  Result Value Ref Range   Troponin i, poc 0.01 0.00 - 0.08 ng/mL   Comment 3            Comment: Due to the release kinetics of cTnI, a negative result within the first hours of the onset of symptoms does not rule out myocardial infarction with certainty. If myocardial infarction is still suspected, repeat the test at appropriate intervals.   Urinalysis, Routine w reflex microscopic     Status: Abnormal   Collection Time: 07/08/16  5:39 AM  Result Value Ref Range   Color, Urine YELLOW YELLOW   APPearance HAZY (A) CLEAR   Specific Gravity, Urine 1.009 1.005 - 1.030   pH 5.0 5.0 - 8.0   Glucose, UA NEGATIVE NEGATIVE mg/dL   Hgb urine dipstick SMALL (A) NEGATIVE   Bilirubin Urine NEGATIVE NEGATIVE   Ketones, ur NEGATIVE NEGATIVE mg/dL   Protein, ur NEGATIVE NEGATIVE mg/dL   Nitrite NEGATIVE NEGATIVE   Leukocytes, UA SMALL (A) NEGATIVE   RBC / HPF 0-5 0 - 5 RBC/hpf   WBC, UA 0-5 0 - 5 WBC/hpf   Bacteria, UA RARE (A) NONE SEEN   Squamous Epithelial / LPF 0-5 (A) NONE SEEN   Hyaline Casts, UA PRESENT   I-Stat Troponin, ED (not at [facility])     Status: None   Collection Time: 07/08/16  8:29 AM  Result Value Ref Range   Troponin i, poc 0.01 0.00 - 0.08 ng/mL   Comment 3            Comment: Due to the release kinetics of cTnI, a negative result within the first hours of the onset of symptoms does not rule out myocardial  infarction with certainty. If myocardial infarction is still suspected, repeat the test at appropriate intervals.   C-reactive protein     Status: Abnormal   Collection Time: 07/08/16  4:20 PM  Result Value Ref Range   CRP 1.3 (H) <1.0 mg/dL  Sedimentation rate     Status: Abnormal   Collection Time: 07/08/16  5:20 PM  Result Value Ref Range    Sed Rate 60 (H) 0 - 22 mm/hr  Comprehensive metabolic panel     Status: Abnormal   Collection Time: 07/09/16  5:48 AM  Result Value Ref Range   Sodium 130 (L) 135 - 145 mmol/L   Potassium 3.6 3.5 - 5.1 mmol/L   Chloride 98 (L) 101 - 111 mmol/L   CO2 23 22 - 32 mmol/L   Glucose, Bld 128 (H) 65 - 99 mg/dL   BUN 13 6 - 20 mg/dL   Creatinine, Ser 1.15 (H) 0.44 - 1.00 mg/dL   Calcium 8.5 (L) 8.9 - 10.3 mg/dL   Total Protein 7.0 6.5 - 8.1 g/dL   Albumin 2.9 (L) 3.5 - 5.0 g/dL   AST 24 15 - 41 U/L   ALT 15 14 - 54 U/L   Alkaline Phosphatase 60 38 - 126 U/L   Total Bilirubin 0.6 0.3 - 1.2 mg/dL   GFR calc non Af Amer 44 (L) >60 mL/min   GFR calc Af Amer 51 (L) >60 mL/min    Comment: (NOTE) The eGFR has been calculated using the CKD EPI equation. This calculation has not been validated in all clinical situations. eGFR's persistently <60 mL/min signify possible Chronic Kidney Disease.    Anion gap 9 5 - 15  CBC     Status: Abnormal   Collection Time: 07/09/16  5:48 AM  Result Value Ref Range   WBC 5.9 4.0 - 10.5 K/uL   RBC 3.63 (L) 3.87 - 5.11 MIL/uL   Hemoglobin 9.7 (L) 12.0 - 15.0 g/dL   HCT 29.5 (L) 36.0 - 46.0 %   MCV 81.3 78.0 - 100.0 fL   MCH 26.7 26.0 - 34.0 pg   MCHC 32.9 30.0 - 36.0 g/dL   RDW 15.1 11.5 - 15.5 %   Platelets 168 150 - 400 K/uL  Protime-INR     Status: Abnormal   Collection Time: 07/09/16  5:48 AM  Result Value Ref Range   Prothrombin Time 23.7 (H) 11.4 - 15.2 seconds   INR 2.08     Current Facility-Administered Medications  Medication Dose Route Frequency Provider Last Rate Last Dose  . acetaminophen (TYLENOL) tablet 650 mg  650 mg Oral Q6H PRN Waldemar Dickens, MD       Or  . acetaminophen (TYLENOL) suppository 650 mg  650 mg Rectal Q6H PRN Waldemar Dickens, MD      . atorvastatin (LIPITOR) tablet 10 mg  10 mg Oral q1800 Waldemar Dickens, MD   10 mg at 07/08/16 1817  . bisoprolol (ZEBETA) tablet 2.5 mg  2.5 mg Oral Daily Waldemar Dickens, MD   2.5 mg at  07/09/16 0908  . feeding supplement (ENSURE ENLIVE) (ENSURE ENLIVE) liquid 237 mL  237 mL Oral BID BM Waldemar Dickens, MD   237 mL at 07/09/16 0908  . furosemide (LASIX) 10 MG/ML solution 5 mg  5 mg Oral Daily Waldemar Dickens, MD   5 mg at 07/09/16 0908  . gentamicin (GARAMYCIN) 60 mg in dextrose 5 % 100 mL IVPB  60 mg Intravenous Q24H Anderson Malta  D Prowers, RPH   60 mg at 07/09/16 1103  . penicillin G potassium 6 Million Units in dextrose 5 % 500 mL continuous infusion  6 Million Units Intravenous Q12H Donalynn Furlong [place], [facility]   6 Million Units at 07/09/16 3762  . promethazine (PHENERGAN) tablet 12.5 mg  12.5 mg Oral Q6H PRN Waldemar Dickens, MD      . sodium chloride flush (NS) 0.9 % injection 10-40 mL  10-40 mL Intracatheter PRN Waldemar Dickens, MD   10 mL at 07/09/16 0549  . traMADol (ULTRAM) tablet 50-100 mg  50-100 mg Oral Q6H PRN Waldemar Dickens, MD   50 mg at 07/09/16 8315  . warfarin (COUMADIN) tablet 2.5 mg  2.5 mg Oral ONCE-1800 Kendra P Hiatt, RPH      . Warfarin - Pharmacist Dosing Inpatient   Does not apply q1800 Lauren D Bajbus, RPH        Musculoskeletal: Strength & Muscle Tone: decreased Gait & Station: unable to stand Patient leans: N/A  Psychiatric Specialty Exam: Physical Exam as per history and physical   ROS anxiety and nervousness and shakes which was relieved with the once single dose of lorazepam last evening. Patient denies current symptoms of depression, mania and psychosis.   Blood pressure (!) 146/89, pulse 96, temperature 98.5 F (36.9 C), temperature source Oral, resp. rate (!) 22, height 4' 10.66" (1.49 m), weight 71.4 kg (157 lb 4.8 oz), SpO2 96 %.Body mass index is 32.14 kg/m.  Psychiatric Specialty Exam: Physical Exam as per history and physical   ROS denied symptoms of depression, anxiety and psychosis. Patient is willing to follow with current medications and outpatient ligation management. Patient has chest pain on arrival which has been controlled with  medication treatment. No Fever-chills, No Headache, No changes with Vision or hearing, reports vertigo No problems swallowing food or Liquids, No Chest pain, Cough or Shortness of Breath, No Abdominal pain, No Nausea or Vommitting, Bowel movements are regular, No Blood in stool or Urine, No dysuria, No new skin rashes or bruises, No new joints pains-aches,  No new weakness, tingling, numbness in any extremity, No recent weight gain or loss, No polyuria, polydypsia or polyphagia,   A full 10 point Review of Systems was done, except as stated above, all other Review of Systems were negative.  Blood pressure (!) 142/74, pulse 78, temperature 98.3 F (36.8 C), temperature source Oral, resp. rate 16, height '5\' 4"'  (1.626 m), weight 106.9 kg (235 lb 9.6 oz), SpO2 96 %.Body mass index is 40.44 kg/m.  General Appearance: Casual  Eye Contact:  Good  Speech:  Clear and Coherent  Volume:  Normal  Mood: generalized anxiety and worry of her health problem  Affect:  Appropriate and Congruent  Thought Process:  Coherent and Goal Directed  Orientation:  Full (Time, Place, and Person)  Thought Content:  WDL  Suicidal Thoughts:  No  Homicidal Thoughts:  No  Memory:  Immediate;   Good Recent;   Fair Remote;   Fair  Judgement:  Intact  Insight:  Good  Psychomotor Activity:  Decreased  Concentration:  Concentration: Good and Attention Span: Good  Recall:  Good  Fund of Knowledge:  Good  Language:  Good  Akathisia:  Yes  Handed:  Right  AIMS (if indicated):     Assets:  Communication Skills Desire for Improvement Financial Resources/Insurance Housing Leisure Time Resilience Social Support Transportation  ADL's:  Intact  Cognition:  WNL  Sleep:  Treatment Plan Summary: 83 years old female who speaks only [place] language required interpreter during my evaluation. Case discussed with the patient daughter on the phone. Patient complaining about anxiety, shaking,  generalized pains and worry since she thought about her health problems including status post heart valve surgery about 8 days ago. Recent acute medical hospitalization required antibiotic therapy.  Case discussed with Dr. Erlinda Hong  Patient denies side suicidal/homicidal ideation and contracts for safety Patient started feeling better since his been in hospital and given lorazepam dose last night She is willing to take gabapentin 100 mg twice daily for controlling anxiety which have no drug drug interactions and minimal side effects and no dependency like benzodiazepines  Appreciate psychiatric consultation and we sign off as of today Please contact [phone] or [phone] if needs further assistance   Disposition: Patient is psychiatrically cleared to be discharged back home when medically stable. No evidence of imminent risk to self or others at present.   Patient does not meet criteria for psychiatric inpatient admission. Supportive therapy provided about ongoing stressors.  Ambrose Finland, MD 07/09/2016 12:42 PM

## 2016-07-09 NOTE — Progress Notes (Signed)
Alexis [name] to be D/C'd Home per MD order.  Discussed with the patient and all questions fully answered. CCMD notified   VSS, Skin clean, dry and intact without evidence of skin break down, no evidence of skin tears noted. IV catheter discontinued intact. Site without signs and symptoms of complications. Dressing and pressure applied.  An After Visit Summary was printed and given to the patient. Patient received prescription.  D/c education completed with patient/family including follow up instructions, medication list, d/c activities limitations if indicated, with other d/c instructions as indicated by MD - patient able to verbalize understanding, all questions fully answered.   Patient instructed to return to ED, call 911, or call MD for any changes in condition.   Patient escorted via [place], and D/C home via private auto.  Alexis [name] 07/09/2016 5:25 PM

## 2016-07-09 NOTE — Discharge Summary (Addendum)
Discharge Summary  Alexis [name] HQR:[id number] DOB: 1933/06/16  PCP: Alexis Kehr, MD  Admit date: 07/08/2016 Discharge date: 07/09/2016  Time spent: >110mns from 2:30 to 3:30pm, more than 50% time spent on coordination of care, communicating with subspecialist, arrange home health, home antibiotic therapy.  Recommendations for Outpatient Follow-up:  1. F/u with PMD within a week  for hospital discharge follow up, repeat cbc/bmp at follow up 2. F/u with infectious disease 3. F/u with cardiology 4. F/u with coumadin clinic, home health RN to check INR in three days  Discharge Diagnoses:  Active Hospital Problems   Diagnosis Date Noted  . Adjustment disorder with mixed anxiety and depressed mood 07/09/2016  . Medication reaction 07/08/2016  . Streptococcal endocarditis 07/08/2016  . Mitral valve replaced 07/08/2016  . Acute on chronic renal insufficiency 07/08/2016  . Chronic atrial fibrillation ([place] 07/08/2016  . 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([place]   . Essential hypertension 05/31/2008    Resolved Hospital Problems   Diagnosis Date Noted Date Resolved  No resolved problems to display.    Discharge Condition: stable  Diet recommendation: heart healthy  Filed Weights   07/08/16 0512 07/09/16 0500  Weight: 72.6 kg (160 lb) 71.4 kg (157 lb 4.8 oz)    History of present illness:  PCP: AWalker Kehr MD Patient coming from: Home  Chief Complaint: Reaction to medication.  HPI: Alexis Zahradnikis a 83y.o. female with medical history significant of atrial fibrillation, CAD, CVA, mitral valve replacement, CABG recently admitted for strep bacteremia with mitral valve vegetations. Discharged on 06/28/2016 with continued IV antibiotic regimen at home. Since time of discharge patient reports being in her normal state of health until 07/06/2016 when she developed generalized ill feeling with intermittent symmetrical extremity pain that occurred partway through her  gentamicin infusion. The symptoms have continued since that time with sporadic involvement of extremities and back. No aggravating or alleviating factors other than antibiotic infusions. Patients symptoms became worse with her ceftriaxone infusion on 07/07/2016. Overnight patient woke from sleep at approximately 03:00 and EMS was called. Patient brought to the emergency room. In the ED patient was again given her IV infusions causing worsening generalized pain. Patient denies other associated symptoms such as diaphoresis, nausea, vomiting, dysuria, diarrhea, lower abdominal pain, received palpitations, swelling, difficulty breathing.   History obtained through interpretive services of patient's daughter. Patient speaks polish.   Patient has been under an increasing amount of stress due to self induced worry that she will die soon. Patient was told that time for mitral valve replacement that the life expectancy for patients with her condition is approximately 9 years. Patient states that she is now more than 9 years past from her mitral valve replacement and expects that this is the beginning of the end and she will die soon.     ED Course: attempted ceftriaxone administration in the ED with adverse reaction to the medicine. At no with some improvement generalized pain.    Hospital Course:  Principal Problem:   Adjustment disorder with mixed anxiety and depressed mood Active Problems:   Essential hypertension   Medication reaction   Streptococcal endocarditis   Mitral valve replaced   Acute on chronic renal insufficiency   Chronic atrial fibrillation (HCC)   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(HCC)   Strep V. Bacteremia/Endocarditis: TEE showing MV vegitations on 1/31. Started on ABX on 06/22/16. CTX and Gent since discahrge on 06/28/16. Presenting symptom Possible reaction to rocephin, +/- anxiety.  - appreciate ID consult for ABX regimen change  and duration  - repeat BCX per infectious  disease: " Discharge antibiotics: IV penicillin 12 million units per day per continuous infusion and gentamicin dosed by home health agency.  Duration: 6 weeks total End Date: March 12th  [facility] Care Per Protocol: BWEEKLY LABS:   x__BMP w GFR  weekly while on IV antibiotics: _x_ CBC with differential   x__ CRP x__ ESR _x_ Gentamicin levels  x__ Please pull PIC at completion of IV antibiotics __ Please leave PIC in place until doctor has seen patient or been notified  Fax weekly labs to [phone]"  Anxiety: psych consult appreciated who recommended neurontin for anxiety control. Per psychiatry Alexis [name] " She is willing to take gabapentin 100 mg twice daily for controlling anxiety which have no drug drug interactions and minimal side effects and no dependency like benzodiazepines"  AoCKD: Cr 1.43. Baseline 1.0.  -clinically dry - - cr 1.15 after gentle hydration, diuretic dose decreased at discharge.  HTN: - continue bisoprolol, acei, diuretics  Chronic Afib: CHA2DS2-VASc = 6 - continue bisoprolol, coumadin -rate controlled, INR therapeutic  CHF: compensated. No edema, lung clear EF 51% w/ mild diastolic dysfunction - Strict I/O, dly wts - Continue bblocker, acei, decrease demadex from daily to every MWF, as clinically she is dry  Hyperlipidemia: - ciontinue lipitor    DVT prophylaxis : Coumadin  Code Status: FULL  Family Communication: daughter  Disposition Plan: home with home health, cleared by infectious disease Consults : Psych , infectious disease    Procedures:  none   Discharge Exam: BP 110/65 (BP Location: Left Arm)   Pulse 68   Temp 97.9 F (36.6 C) (Oral)   Resp 16   Ht 4' 10.66" (1.49 m)   Wt 71.4 kg (157 lb 4.8 oz)   SpO2 96%   BMI 32.14 kg/m   General: NAD Cardiovascular: IRRR Respiratory: clear Extremity: no edema Skin: scattered patchy erythematous rash on back only  Discharge Instructions You  were cared for by a hospitalist during your hospital stay. If you have any questions about your discharge medications or the care you received while you were in the hospital after you are discharged, you can call the unit and asked to speak with the hospitalist on call if the hospitalist that took care of you is not available. Once you are discharged, your primary care physician will handle any further medical issues. Please note that NO REFILLS for any discharge medications will be authorized once you are discharged, as it is imperative that you return to your primary care physician (or establish a relationship with a primary care physician if you do not have one) for your aftercare needs so that they can reassess your need for medications and monitor your lab values.  Discharge Instructions    Diet general    Complete by:  As directed    Home infusion instructions [facility] May follow [place] Dosing Protocol; May administer Cathflo as needed to maintain patency of vascular access device.; Flushing of vascular access device: per [facility] Protocol: 0.9% NaCl pre/post medica...    Complete by:  As directed    Instructions:  May follow [place] Dosing Protocol   Instructions:  May administer Cathflo as needed to maintain patency of vascular access device.   Instructions:  Flushing of vascular access device: per [facility] Protocol: 0.9% NaCl pre/post medication administration and prn patency; Heparin 100 u/ml, 5m for implanted ports and Heparin 10u/ml, 544mfor all other central venous catheters.  Instructions:  May follow [facility] Anaphylaxis Protocol for First Dose Administration in the home: 0.9% NaCl at 25-50 ml/hr to maintain IV access for protocol meds. Epinephrine 0.3 ml IV/IM PRN and Benadryl 25-50 IV/IM PRN s/s of anaphylaxis.   Instructions:  [facility] Infusion Coordinator (RN) to assist per patient IV care needs in the home PRN.   Home infusion instructions [facility] May  follow [place] Dosing Protocol; May administer Cathflo as needed to maintain patency of vascular access device.; Flushing of vascular access device: per [facility] Protocol: 0.9% NaCl pre/post medica...    Complete by:  As directed    Instructions:  May follow [place] Dosing Protocol   Instructions:  May administer Cathflo as needed to maintain patency of vascular access device.   Instructions:  Flushing of vascular access device: per [facility] Protocol: 0.9% NaCl pre/post medication administration and prn patency; Heparin 100 u/ml, 28m for implanted ports and Heparin 10u/ml, 556mfor all other central venous catheters.   Instructions:  May follow [facility] Anaphylaxis Protocol for First Dose Administration in the home: 0.9% NaCl at 25-50 ml/hr to maintain IV access for protocol meds. Epinephrine 0.3 ml IV/IM PRN and Benadryl 25-50 IV/IM PRN s/s of anaphylaxis.   Instructions:  [facility] Coordinator (RN) to assist per patient IV care needs in the home PRN.   Increase activity slowly    Complete by:  As directed      Allergies as of 07/09/2016      Reactions   Ceftriaxone Other (See Comments)   Per MD progress note 2/12:  Infusion reaction.  Denies other associated symptoms such as diaphoresis, nausea, vomiting, dysuria, diarrhea, lower abdominal pain, received palpitations, swelling, difficulty breathing   Hydralazine Hcl Other (See Comments)   Headache      Medication List    STOP taking these medications   cefTRIAXone 2 g in dextrose 5 % 50 mL   gentamicin 1.2-0.9 MG/ML-% Commonly known as:  GARAMYCIN   UNABLE TO FIND     TAKE these medications   acetaminophen 500 MG tablet Commonly known as:  TYLENOL Take 500-1,000 mg by mouth 2 (two) times daily as needed for fever.   atorvastatin 10 MG tablet Commonly known as:  LIPITOR Take 1 tablet (10 mg total) by mouth daily.   bisoprolol 5 MG tablet Commonly known as:  ZEBETA Take 0.5 tablets (2.5 mg total) by mouth  daily. What changed:  how much to take   feeding supplement (ENSURE ENLIVE) Liqd Take 237 mLs by mouth 2 (two) times daily between meals.   gabapentin 100 MG capsule Commonly known as:  NEURONTIN Take 1 capsule (100 mg total) by mouth 2 (two) times daily.   gentamicin IVPB Commonly known as:  GARAMYCIN Inject 60 mg into the vein daily. Indication: 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Last Day of Therapy:  08/05/16 Labs - Sunday/Monday:  CBC/D, BMP, and gentamicin trough. Labs - Thursday:  BMP and gentamicin trough Labs - Every other week:  ESR and CRP   loratadine 10 MG tablet Commonly known as:  CLARITIN Take 1 tablet (10 mg total) by mouth daily.   penicillin G IVPB Inject 12 Million Units into the vein daily. as continuous infusion. Indication: 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Last Day of Therapy:  08/05/16 Labs - Once weekly:  CBC/D and BMP, Labs - Every other week:  ESR and CRP   ramipril 2.5 MG capsule Commonly known as:  ALTACE Take 1 capsule (2.5 mg total)  by mouth daily. What changed:  medication strength  how much to take   torsemide 5 MG tablet Commonly known as:  DEMADEX Take 0.5 tablets (2.5 mg total) by mouth every Monday, Wednesday, and Friday. Start taking on:  07/10/2016 What changed:  when to take this   triamcinolone ointment 0.5 % Commonly known as:  KENALOG Apply 1 application topically 2 (two) times daily.   Vitamin D3 2000 units capsule Take 1 capsule (2,000 Units total) by mouth daily.   warfarin 2.5 MG tablet Commonly known as:  COUMADIN Take as directed by anticoagulation clinic What changed:  how much to take  how to take this  when to take this  additional instructions            Home Infusion Instuctions        Start     Ordered   07/09/16 0000  Home infusion instructions [facility] May follow [place] Dosing Protocol; May administer Cathflo as needed to maintain patency of vascular access device.; Flushing of vascular  access device: per [facility] Protocol: 0.9% NaCl pre/post medica...    Question Answer Comment  Instructions May follow [place] Dosing Protocol   Instructions May administer Cathflo as needed to maintain patency of vascular access device.   Instructions Flushing of vascular access device: per [facility] Protocol: 0.9% NaCl pre/post medication administration and prn patency; Heparin 100 u/ml, 22m for implanted ports and Heparin 10u/ml, 573mfor all other central venous catheters.   Instructions May follow [facility] Anaphylaxis Protocol for First Dose Administration in the home: 0.9% NaCl at 25-50 ml/hr to maintain IV access for protocol meds. Epinephrine 0.3 ml IV/IM PRN and Benadryl 25-50 IV/IM PRN s/s of anaphylaxis.   Instructions [facility] Infusion Coordinator (RN) to assist per patient IV care needs in the home PRN.      07/09/16 1520   07/09/16 0000  Home infusion instructions [facility] May follow [place] Protocol; May administer Cathflo as needed to maintain patency of vascular access device.; [place] of vascular access device[facility]: 0.9% NaCl pre/post medica...    Question Answer Comment  Instructions May follow [facility]   Instructions May administer Cathflo as needed to maintain patency of vascular access device.   Instructions Flushing of vascular access device: [facility]: 0.9% NaCl pre/post medication administration and prn patency; Heparin 100 u/ml, 43m343mor implanted ports and Heparin 10u/ml, 43ml643mr all other central venous catheters.   Instructions May follow [facility] Anaphylaxis Protocol for First Dose Administration in the home: 0.9% NaCl at 25-50 ml/hr to maintain IV access for protocol meds. Epinephrine 0.3 ml IV/IM PRN and Benadryl 25-50 IV/IM PRN s/s of anaphylaxis.   Instructions [facility] Infusion Coordinator (RN) to assist per patient IV care needs in the home PRN.      07/09/16 1443     Allergies  Allergen  Reactions  . Ceftriaxone Other (See Comments)    Per MD progress note 2/12:  Infusion reaction.  Denies other associated symptoms such as diaphoresis, nausea, vomiting, dysuria, diarrhea, lower abdominal pain, received palpitations, swelling, difficulty breathing  . Hydralazine Hcl Other (See Comments)    Headache    Follow-up Information    [facility] Follow up.   Why:  resumption of home health services  Contact information: [address] [place] [phone]        Inc[facility] - Dme [facility] Follow  up.   Why:  resumption of services ( IV antibiotic/ pump) Contact information: [address] [place] [address] [phone]        Alex Plotnikov, MD Follow up in 1 week(s).   Specialty:  Internal Medicine Why:  hospital discharge follow up, repeat cbc/bmp at follow up Contact information: [place] [address] [phone]        [facility]              Follow up.   Why:  endocarditis Contact information: [address] [place] [phone]       Kate Sable, MD Follow up in 3 week(s).   Specialty:  Cardiology Why:  endocarditis Contact information: [place] [address] [phone]            The results of significant diagnostics from this hospitalization (including imaging, microbiology, ancillary and laboratory) are listed below for reference.    Significant Diagnostic Studies: Dg Chest Port 1 View  Result Date: 06/28/2016 CLINICAL DATA:  Central line placement. EXAM: PORTABLE CHEST 1 VIEW COMPARISON:  None. FINDINGS: The patient's right PICC is noted ending about the mid SVC. Mild vascular congestion is noted. Mild peribronchial thickening is seen. Minimal bilateral atelectasis is seen. No pleural effusion or pneumothorax is identified. The cardiomediastinal silhouette is mildly enlarged. The  patient is status post median sternotomy. No acute osseous abnormalities are identified. IMPRESSION: 1. Right PICC noted ending about the mid SVC. 2. Mild vascular congestion and mild cardiomegaly. Peribronchial thickening seen. Minimal bilateral atelectasis noted. Electronically Signed   By: Garald Balding M.D.   On: 06/28/2016 16:48   Dg Abdomen Acute W/chest  Result Date: 07/08/2016 CLINICAL DATA:  Epigastric pain tonight. EXAM: DG ABDOMEN ACUTE W/ 1V CHEST COMPARISON:  Chest 06/28/2016 FINDINGS: Postoperative changes in the mediastinum. Right PICC line with tip over the low SVC region. Shallow inspiration. Heart size and pulmonary vascularity are normal. Linear scarring or atelectasis in the mid lungs, similar to prior study. Calcified and tortuous aorta. Prominent upper abdominal vascular calcifications. Scattered gas and stool throughout the colon. No small or large bowel distention. No free intra- abdominal air. No abnormal air-fluid levels. Calcifications in the pelvis consistent with fibroids. Vascular calcifications in the aorta and iliac arteries. Degenerative changes in the spine and hips. IMPRESSION: No evidence of active pulmonary disease. Nonobstructive bowel gas pattern. Vascular calcifications. Calcified fibroids. Electronically Signed   By: Lucienne Capers M.D.   On: 07/08/2016 06:03    Microbiology: No results found for this or any previous visit (from the past 240 hour(s)).   Labs: Basic Metabolic Panel:  Recent Labs Lab 07/08/16 0525 07/09/16 0548  NA 136 130*  K 3.6 3.6  CL 102 98*  CO2 19* 23  GLUCOSE 110* 128*  BUN 18 13  CREATININE 1.43* 1.15*  CALCIUM 9.2 8.5*   Liver Function Tests:  Recent Labs Lab 07/08/16 0525 07/09/16 0548  AST 30 24  ALT 17 15  ALKPHOS 63 60  BILITOT 0.9 0.6  PROT 7.5 7.0  ALBUMIN 3.3* 2.9*    Recent Labs Lab 07/08/16 0525  LIPASE 68*   No results for input(s): AMMONIA in the last 168 hours. CBC:  Recent Labs Lab  07/08/16 0525 07/09/16 0548  WBC 4.3 5.9  NEUTROABS 3.1  --   HGB 10.7* 9.7*  HCT 32.2* 29.5*  MCV 80.1 81.3  PLT 187 168  Cardiac Enzymes:  Recent Labs Lab 07/08/16 0525  CKTOTAL 46   BNP: BNP (last 3 results) No results for input(s): BNP in the last 8760 hours.  ProBNP (last 3 results) No results for input(s): PROBNP in the last 8760 hours.  CBG: No results for input(s): GLUCAP in the last 168 hours.     SignedFlorencia Reasons MD, PhD  [facility] 07/09/2016, 3:30 PM

## 2016-07-09 NOTE — Care Management Note (Signed)
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 Presented with potential medication reaction (Rocephin).  Dorita Fray (Daughter)     [phone]       Action/Plan: Plan is to d/c to home with resumption of home health services (RN/ IV ABX).   Expected Discharge Date:    07/09/2016          Expected Discharge Plan:  [place]  In-House Referral:     Discharge planning Services  CM Consult  Post Acute Care Choice:  Resumption of Svcs/PTA Provider (charity case) Choice offered to:     DME Arranged:  IV pump/equipment (Charity case) DME Agency:  [place]:  RN [facility] Agency:  [place]  Status of Service:  Completed, signed off  If discussed at [place] of Stay Meetings, dates discussed:    Additional Comments:  Sharin Mons, RN 07/09/2016, 1:06 PM

## 2016-07-09 NOTE — Evaluation (Signed)
Occupational Therapy Evaluation Patient Details Name: Alexis [name] MRN: [id number] DOB: 1934/05/26 Today's Date: 07/09/2016    History of Present Illness Patient is a 83 year old female history of hypertension, coronary artery disease status post CABG in 2004, CVA, atrial fibrillation on chronic Coumadin presenting with potential medication reaction.   Clinical Impression   OT evaluation initiated and completed. Pt performed self care tasks, transfers, and ambulation in room while holding onto IV pole as she was receiving medications. Pt performed all tasks at mod I level. Pt would likely need intermittent supervision for home for tasks with increased fall risk such as shower transfers and bathing. Pt does not need acute OT intervention at this time. OT SIGNING OFF. Thank you for the referral.    Follow Up Recommendations  No OT follow up;Supervision - Intermittent    Equipment Recommendations  None recommended by OT    Recommendations for Other Services       Precautions / Restrictions Precautions Precautions: None Restrictions Weight Bearing Restrictions: No      Mobility Bed Mobility Overal bed mobility: Independent             General bed mobility comments: able to sit up at the edge of the bed without difficulty   Transfers Overall transfer level: Modified independent                    Balance Overall balance assessment: Modified Independent                                          ADL Overall ADL's : Independent                                       General ADL Comments: Pt navigated room with IV pole, performed toilet transfers and clothing management without issue. She is able to don and doff B socks while seated on EOB.     Vision     Perception     Praxis      Pertinent Vitals/Pain Pain Assessment: No/denies pain     Hand Dominance Right   Extremity/Trunk Assessment Upper Extremity  Assessment Upper Extremity Assessment: Overall WFL for tasks assessed   Lower Extremity Assessment Lower Extremity Assessment: Overall WFL for tasks assessed   Cervical / Trunk Assessment Cervical / Trunk Assessment: Kyphotic   Communication Communication Communication: Other (comment) (Pt speaks russian and daughter was interpreter via phone)   Cognition Arousal/Alertness: Awake/alert Behavior During Therapy: WFL for tasks assessed/performed Overall Cognitive Status: Within Functional Limits for tasks assessed                     General Comments       Exercises       Shoulder Instructions      Home Living Family/patient expects to be discharged to:: Private residence Living Arrangements: Other (Comment) (friend) Available Help at Discharge: Family;Available PRN/intermittently Type of Home: House Home Access: Stairs to enter Entrance Stairs-Number of Steps: 1 Entrance Stairs-Rails: None Home Layout: One level     Bathroom Shower/Tub: English as a second language teacher characteristics: Door Biochemist, clinical: Standard     Home Equipment: Shower seat;Grab bars - tub/shower;Walker - 4 wheels          Prior Functioning/Environment Level of Independence: Independent  OT Problem List:     OT Treatment/Interventions:      OT Goals(Current goals can be found in the care plan section) Acute Rehab OT Goals Patient Stated Goal: to get better OT Goal Formulation: With patient Time For Goal Achievement: 07/23/16 Potential to Achieve Goals: Good  OT Frequency:     Barriers to D/C:            Co-evaluation              End of Session    Activity Tolerance: Patient tolerated treatment well Patient left: in bed;with call bell/phone within reach   Time: 8546-2703 OT Time Calculation (min): 13 min Charges:  OT General Charges $OT Visit: 1 Procedure OT Evaluation $OT Eval Low Complexity: 1 Procedure G-Codes: OT G-codes **NOT FOR  INPATIENT CLASS** Functional Assessment Tool Used: Clinical Judgement Functional Limitation: Self care Self Care Current Status (J0093): At least 1 percent but less than 20 percent impaired, limited or restricted Self Care Goal Status (G1829): At least 1 percent but less than 20 percent impaired, limited or restricted Self Care Discharge Status [phone]): At least 1 percent but less than 20 percent impaired, limited or restricted  Gypsy Decant 07/09/2016, 3:00 PM

## 2016-07-09 NOTE — Progress Notes (Signed)
ANTICOAGULATION CONSULT NOTE - Follow Up Consult  Pharmacy Consult for Coumadin Indication: atrial fibrillation  Allergies  Allergen Reactions  . Ceftriaxone Other (See Comments)    Per MD progress note 2/12:  Infusion reaction.  Denies other associated symptoms such as diaphoresis, nausea, vomiting, dysuria, diarrhea, lower abdominal pain, received palpitations, swelling, difficulty breathing  . Hydralazine Hcl Other (See Comments)    Headache     Patient Measurements: Height: 4' 10.66" (149 cm) Weight: 157 lb 4.8 oz (71.4 kg) IBW/kg (Calculated) : 42.42  Vital Signs: Temp: 98.5 F (36.9 C) (02/13 0518) Temp Source: Oral (02/13 0518) BP: 146/89 (02/13 0904) Pulse Rate: 96 (02/13 0904)  Labs:  Recent Labs  07/08/16 0525 07/09/16 0548  HGB 10.7* 9.7*  HCT 32.2* 29.5*  PLT 187 168  LABPROT 20.3* 23.7*  INR 1.71 2.08  CREATININE 1.43* 1.15*  CKTOTAL 46  --     Estimated Creatinine Clearance: 33.3 mL/min (by C-G formula based on SCr of 1.15 mg/dL (H)).   Medications:  Scheduled:  . atorvastatin  10 mg Oral q1800  . bisoprolol  2.5 mg Oral Daily  . feeding supplement (ENSURE ENLIVE)  237 mL Oral BID BM  . furosemide  5 mg Oral Daily  . gentamicin  60 mg Intravenous Q24H  . penicillin g continuous IV infusion  6 Million Units Intravenous Q12H  . Warfarin - Pharmacist Dosing Inpatient   Does not apply q1800    Assessment: 26 YOF with history of AFib and mitral valve replacement on warfarin.  INR within goal range today. No bleeding noted.    Goal of Therapy:  INR 2-3 Monitor platelets by anticoagulation protocol: Yes   Plan:  Repeat Coumadin 2.5mg  today Daily INR Watch for s/s of bleeding   Gracy Bruins, PharmD [facility]

## 2016-07-09 NOTE — Progress Notes (Signed)
Pharmacy OPAT Consult  Indication: Viridans group streptococcus prosthetic valve endocarditis Regimen: Penicillin G 12 million units IV daily as continuous infusion, gentamicin 60 mg IV daily End date: 08/05/16   Thank you for allowing pharmacy to be a part of this patient's care,  Gwenlyn Perking, PharmD PGY1 Pharmacy Resident Pager: [phone] 07/09/2016 3:59 PM

## 2016-07-09 NOTE — Progress Notes (Signed)
Subjective:  Her multiple joint pains have disappeared along with her abdominal pain or epigastric pain improved after being given an anxiolytic.  Antibiotics:  Anti-infectives    Start     Dose/Rate Route Frequency Ordered Stop   07/08/16 1230  penicillin G potassium 6 Million Units in dextrose 5 % 500 mL continuous infusion     6 Million Units 41.7 mL/hr over 12 Hours Intravenous Every 12 hours 07/08/16 1143     07/08/16 1200  penicillin G potassium 3 Million Units in dextrose 53m IVPB  Status:  Discontinued     3 Million Units 100 mL/hr over 30 Minutes Intravenous Every 4 hours 07/08/16 1052 07/08/16 1143   07/08/16 1200  gentamicin (GARAMYCIN) 60 mg in dextrose 5 % 100 mL IVPB     60 mg 101.5 mL/hr over 60 Minutes Intravenous Every 24 hours 07/08/16 1131     07/08/16 0715  cefTRIAXone (ROCEPHIN) 2 g in dextrose 5 % 50 mL IVPB     2 g 100 mL/hr over 30 Minutes Intravenous  Once 07/08/16 0703 07/08/16 0815   07/08/16 0700  gentamicin (GARAMYCIN) IVPB 60 mg  Status:  Discontinued     60 mg 100 mL/hr over 30 Minutes Intravenous  Once 07/08/16 0639 07/08/16 0657      Medications: Scheduled Meds: . atorvastatin  10 mg Oral q1800  . bisoprolol  2.5 mg Oral Daily  . feeding supplement (ENSURE ENLIVE)  237 mL Oral BID BM  . furosemide  5 mg Oral Daily  . gentamicin  60 mg Intravenous Q24H  . penicillin g continuous IV infusion  6 Million Units Intravenous Q12H  . warfarin  2.5 mg Oral ONCE-1800  . Warfarin - Pharmacist Dosing Inpatient   Does not apply q1800   Continuous Infusions: PRN Meds:.acetaminophen **OR** acetaminophen, promethazine, sodium chloride flush, traMADol    Objective: Weight change: -2 lb 11.2 oz (-1.225 kg)  Intake/Output Summary (Last 24 hours) at 07/09/16 1322 Last data filed at 07/09/16 0918  Gross per 24 hour  Intake           1211.5 ml  Output              103 ml  Net           1108.5 ml   Blood pressure (!) 146/89, pulse 96,  temperature 98.5 F (36.9 C), temperature source Oral, resp. rate (!) 22, height 4' 10.66" (1.49 m), weight 157 lb 4.8 oz (71.4 kg), SpO2 96 %. Temp:  [98.5 F (36.9 C)-98.9 F (37.2 C)] 98.5 F (36.9 C) (02/13 0518) Pulse Rate:  [88-99] 96 (02/13 0904) Resp:  [18-24] 22 (02/13 0518) BP: (141-154)/(67-89) 146/89 (02/13 0904) SpO2:  [92 %-97 %] 96 % (02/13 0904) Weight:  [157 lb 4.8 oz (71.4 kg)] 157 lb 4.8 oz (71.4 kg) (02/13 0500)  Physical Exam: General: Alert and awake, Anxious appearing. HEENT: anicteric sclera,  EOMI, oropharynx clear and without exudate Cardiovascular: Tachycardic rate, normal r, Pulmonary: clear to auscultation bilaterally, no wheezing, rales or rhonchi Gastrointestinal: soft nontender, nondistended, normal bowel sounds,  Neuro: nonfocal  CBC:  CBC Latest Ref Rng & Units 07/09/2016 07/08/2016 06/28/2016  WBC 4.0 - 10.5 K/uL 5.9 4.3 6.1  Hemoglobin 12.0 - 15.0 g/dL 9.7(L) 10.7(L) 9.9(L)  Hematocrit 36.0 - 46.0 % 29.5(L) 32.2(L) 30.0(L)  Platelets 150 - 400 K/uL 168 187 119(L)     BMET  Recent Labs  07/08/16 0525 07/09/16 0548  NA  136 130*  K 3.6 3.6  CL 102 98*  CO2 19* 23  GLUCOSE 110* 128*  BUN 18 13  CREATININE 1.43* 1.15*  CALCIUM 9.2 8.5*     Liver Panel   Recent Labs  07/08/16 0525 07/09/16 0548  PROT 7.5 7.0  ALBUMIN 3.3* 2.9*  AST 30 24  ALT 17 15  ALKPHOS 63 60  BILITOT 0.9 0.6  BILIDIR 0.2  --   IBILI 0.7  --        Sedimentation Rate  Recent Labs  07/08/16 1720  ESRSEDRATE 60*   C-Reactive Protein  Recent Labs  07/08/16 1620  CRP 1.3*    Micro Results: Recent Results (from the past 720 hour(s))  Blood culture (routine single)     Status: None   Collection Time: 06/20/16  4:37 PM  Result Value Ref Range Status   Culture VIRIDANS STREPTOCOCCUS  Final    Comment: Culture results may be compromised due to an excessive volume of blood received in culture bottles. Gram Stain Report Called to,Read Back  By and Verified With: STACI VOICEMAIL DR [facility] 06-21-16 1140    Organism ID, Bacteria VIRIDANS STREPTOCOCCUS  Final      Susceptibility   Viridans streptococcus -  (no method available)    PENICILLIN 0.094 Intermediate     CEFTRIAXONE 0.047 Sensitive     LEVOFLOXACIN 0.75 Sensitive   Blood culture (routine x 2)     Status: Abnormal   Collection Time: 06/22/16  1:16 PM  Result Value Ref Range Status   Specimen Description BLOOD RIGHT ANTECUBITAL  Final   Special Requests BOTTLES DRAWN AEROBIC ONLY 6CC  Final   Culture  Setup Time   Final    AEROBIC BOTTLE ONLY GRAM POSITIVE COCCI IN CHAINS CRITICAL RESULT CALLED TO, READ BACK BY AND VERIFIED WITH: TO LBAJBUS(PHARMD) BY TCLEVELAND 06/23/2016 AT 5:24AM    Culture (A)  Final    VIRIDANS STREPTOCOCCUS SUSCEPTIBILITIES PERFORMED ON PREVIOUS CULTURE WITHIN THE LAST 5 DAYS.    Report Status 06/25/2016 FINAL  Final  Blood culture (routine x 2)     Status: Abnormal   Collection Time: 06/22/16  1:22 PM  Result Value Ref Range Status   Specimen Description BLOOD RIGHT HAND  Final   Special Requests BOTTLES DRAWN AEROBIC AND ANAEROBIC 5CC  Final   Culture  Setup Time   Final    IN BOTH AEROBIC AND ANAEROBIC BOTTLES GRAM POSITIVE COCCI IN CHAINS CRITICAL RESULT CALLED TO, READ BACK BY AND VERIFIED WITH: TO  LBAJBUS(PHARMD) BY TCLEVELAND 06/23/2016 AT 5:24AM    Culture VIRIDANS STREPTOCOCCUS (A)  Final   Report Status 06/25/2016 FINAL  Final   Organism ID, Bacteria VIRIDANS STREPTOCOCCUS  Final      Susceptibility   Viridans streptococcus - MIC*    PENICILLIN Value in next row Intermediate      INTERMEDIATE0.25    CEFTRIAXONE Value in next row Sensitive      SENSITIVE<=0.12    ERYTHROMYCIN Value in next row Sensitive      SENSITIVE<=0.12    LEVOFLOXACIN Value in next row Sensitive      SENSITIVE<=0.12    VANCOMYCIN Value in next row Sensitive      SENSITIVE<=0.12    * VIRIDANS STREPTOCOCCUS  Blood Culture ID Panel (Reflexed)      Status: Abnormal   Collection Time: 06/22/16  1:22 PM  Result Value Ref Range Status   Enterococcus species NOT DETECTED NOT DETECTED Final   Listeria monocytogenes  NOT DETECTED NOT DETECTED Final   Staphylococcus species NOT DETECTED NOT DETECTED Final   Staphylococcus aureus NOT DETECTED NOT DETECTED Final   Streptococcus species DETECTED (A) NOT DETECTED Final    Comment: CRITICAL RESULT CALLED TO, READ BACK BY AND VERIFIED WITH: TO LBAJBUS(PHARMD) BY TCLEVELAND 06/23/2016 AT 5:24AM    Streptococcus agalactiae NOT DETECTED NOT DETECTED Final   Streptococcus pneumoniae NOT DETECTED NOT DETECTED Final   Streptococcus pyogenes NOT DETECTED NOT DETECTED Final   Acinetobacter baumannii NOT DETECTED NOT DETECTED Final   Enterobacteriaceae species NOT DETECTED NOT DETECTED Final   Enterobacter cloacae complex NOT DETECTED NOT DETECTED Final   Escherichia coli NOT DETECTED NOT DETECTED Final   Klebsiella oxytoca NOT DETECTED NOT DETECTED Final   Klebsiella pneumoniae NOT DETECTED NOT DETECTED Final   Proteus species NOT DETECTED NOT DETECTED Final   Serratia marcescens NOT DETECTED NOT DETECTED Final   Haemophilus influenzae NOT DETECTED NOT DETECTED Final   Neisseria meningitidis NOT DETECTED NOT DETECTED Final   Pseudomonas aeruginosa NOT DETECTED NOT DETECTED Final   Candida albicans NOT DETECTED NOT DETECTED Final   Candida glabrata NOT DETECTED NOT DETECTED Final   Candida krusei NOT DETECTED NOT DETECTED Final   Candida parapsilosis NOT DETECTED NOT DETECTED Final   Candida tropicalis NOT DETECTED NOT DETECTED Final  Urine culture     Status: None   Collection Time: 06/22/16  5:39 PM  Result Value Ref Range Status   Specimen Description URINE, RANDOM  Final   Special Requests NONE  Final   Culture NO GROWTH  Final   Report Status 06/24/2016 FINAL  Final  Culture, blood (routine x 2)     Status: None   Collection Time: 06/25/16  5:35 AM  Result Value Ref Range Status    Specimen Description BLOOD LEFT ARM  Final   Special Requests BOTTLES DRAWN AEROBIC AND ANAEROBIC 5CC EACH  Final   Culture NO GROWTH 5 DAYS  Final   Report Status 06/30/2016 FINAL  Final  Culture, blood (routine x 2)     Status: None   Collection Time: 06/25/16  5:37 AM  Result Value Ref Range Status   Specimen Description BLOOD RIGHT ARM  Final   Special Requests BOTTLES DRAWN AEROBIC ONLY 5CC  Final   Culture NO GROWTH 5 DAYS  Final   Report Status 06/30/2016 FINAL  Final    Studies/Results: Dg Abdomen Acute W/chest  Result Date: 07/08/2016 CLINICAL DATA:  Epigastric pain tonight. EXAM: DG ABDOMEN ACUTE W/ 1V CHEST COMPARISON:  Chest 06/28/2016 FINDINGS: Postoperative changes in the mediastinum. Right PICC line with tip over the low SVC region. Shallow inspiration. Heart size and pulmonary vascularity are normal. Linear scarring or atelectasis in the mid lungs, similar to prior study. Calcified and tortuous aorta. Prominent upper abdominal vascular calcifications. Scattered gas and stool throughout the colon. No small or large bowel distention. No free intra- abdominal air. No abnormal air-fluid levels. Calcifications in the pelvis consistent with fibroids. Vascular calcifications in the aorta and iliac arteries. Degenerative changes in the spine and hips. IMPRESSION: No evidence of active pulmonary disease. Nonobstructive bowel gas pattern. Vascular calcifications. Calcified fibroids. Electronically Signed   By: Lucienne Capers M.D.   On: 07/08/2016 06:03      Assessment/Plan:  INTERVAL HISTORY: Patient is tolerating penicillin without problems and her other symptoms have resolved   Active Problems:   Essential hypertension   Medication reaction   Streptococcal endocarditis   Mitral valve replaced  Acute on chronic renal insufficiency   Chronic atrial fibrillation (HCC)   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([place])    Alexis [name] is a 83 y.o. female with prosthetic mitral  valve endocarditis with viridans group streptococci with intermediate susceptibility to penicillin, who has had apparent reaction to ceftriaxone though not sure if this is a to reaction or something mediated by her anxiety. As tolerated penicillin well and now she would like to go home and the daughter like to take her home.  #1 prosthetic valve endocarditis  Diagnosis: PVE  Culture Result: Viridans group streptococcus  Allergies  Allergen Reactions  . Ceftriaxone Other (See Comments)    Per MD progress note 2/12:  Infusion reaction.  Denies other associated symptoms such as diaphoresis, nausea, vomiting, dysuria, diarrhea, lower abdominal pain, received palpitations, swelling, difficulty breathing  . Hydralazine Hcl Other (See Comments)    Headache     Discharge antibiotics: IV penicillin 12 million units per day per continuous infusion and gentamicin dosed by home health agency.   Duration: 6 weeks total End Date: March 12th  [facility] Care Per Protocol: BWEEKLY LABS:   x__BMP w GFR  weekly while on IV antibiotics: _x_ CBC with differential   x__ CRP x__ ESR _x_ Gentamicin levels  x__ Please pull PIC at completion of IV antibiotics __ Please leave PIC in place until doctor has seen patient or been notified  Fax weekly labs to [phone]  Clinic Follow Up Appt:  I will arrange clinic follow-up prior to her stopping her antibiotics in the 12th. I will sign off for now please call with further questions.    LOS: 0 days   Alcide Evener 07/09/2016, 1:22 PM

## 2016-07-09 NOTE — Progress Notes (Signed)
Physical Therapy Evaluation Patient Details Name: Alexis [name] MRN: [id number] DOB: [birth year] Today's Date: 07/09/2016   History of Present Illness  Patient is a 83 year old female history of hypertension, coronary artery disease status post CABG in 2004, CVA, atrial fibrillation on chronic Coumadin presenting with potential medication reaction.  Clinical Impression  Patient presents with decreased endurance and some balance defects which may be baseline. She reports for about 6 months her left leg has felt weaker then her right. She may benefit from the use of a cane. She will continue to be followed by skilled acute therapy to improve her endurance and balance with gait. She may also benefit from skilled home health for the same goals. She was seen for a low complexity evaluation.     Follow Up Recommendations Home health PT (for balance and endurance training )    Equipment Recommendations  None recommended by PT    Recommendations for Other Services       Precautions / Restrictions Precautions Precautions: None Restrictions Weight Bearing Restrictions: No      Mobility  Bed Mobility Overal bed mobility: Independent             General bed mobility comments: able to sit up at the edge of the bed without difficulty   Transfers Overall transfer level: Needs assistance   Transfers: Sit to/from Stand Sit to Stand: Supervision         General transfer comment: Sit to stand 3x with therapy. She reported initial syncope upon standing. but syncope cleared after several seconds.   Ambulation/Gait Ambulation/Gait assistance: Modified independent (Device/Increase time) Ambulation Distance (Feet): 120 Feet Assistive device: None (held IV pole at times but no apparent LOB ) Gait Pattern/deviations: Decreased stride length;Drifts right/left   Gait velocity interpretation: Below normal speed for age/gender General Gait Details: Patient reported syncope while looking  up and down the hallways.   Stairs            Wheelchair Mobility    Modified Rankin (Stroke Patients Only)       Balance Overall balance assessment: Needs assistance Sitting-balance support: No upper extremity supported Sitting balance-Leahy Scale: Good     Standing balance support: No upper extremity supported Standing balance-Leahy Scale: Fair Standing balance comment: syncope upon initial standing            [place]homberg - Eyes Closed:  (sec (posterior LOB))                 Pertinent Vitals/Pain Pain Assessment: No/denies pain    Home Living Family/patient expects to be discharged to:: Private residence Living Arrangements: Other (Comment) (friend) Available Help at Discharge: Family;Available PRN/intermittently Type of Home: House Home Access: Stairs to enter Entrance Stairs-Rails: None Entrance Stairs-Number of Steps: 1 Home Layout: One level Home Equipment: Shower seat;Grab bars - tub/shower;Walker - 4 wheels      Prior Function                 Hand Dominance        Extremity/Trunk Assessment   Upper Extremity Assessment Upper Extremity Assessment: Overall WFL for tasks assessed    Lower Extremity Assessment Lower Extremity Assessment: Overall WFL for tasks assessed       Communication      Cognition Arousal/Alertness: Awake/alert Behavior During Therapy: WFL for tasks assessed/performed Overall Cognitive Status: Within Functional Limits for tasks assessed  General Comments General comments (skin integrity, edema, etc.): fatigue noted with gait.     Exercises     Assessment/Plan    PT Assessment Patent does not need any further PT services  PT Problem List            PT Treatment Interventions      PT Goals (Current goals can be found in the Care Plan section)  Acute Rehab PT Goals Patient Stated Goal: to get better PT Goal Formulation: With patient/family Time For Goal Achievement:  07/16/16 Potential to Achieve Goals: Good    Frequency     Barriers to discharge        Co-evaluation               End of Session Equipment Utilized During Treatment: Gait belt Activity Tolerance: Patient tolerated treatment well Patient left: in bed;with call bell/phone within reach;with nursing/sitter in room (in bed per patient request ) Nurse Communication: Mobility status    Functional Assessment Tool Used: clinical decision making  Functional Limitation: Mobility: Walking and moving around Mobility: Walking and Moving Around Current Status (Q1975): At least 1 percent but less than 20 percent impaired, limited or restricted Mobility: Walking and Moving Around Goal Status [phone]): At least 1 percent but less than 20 percent impaired, limited or restricted    Time: 0903-0924 PT Time Calculation (min) (ACUTE ONLY): 21 min   Charges:   PT Evaluation $PT Eval Low Complexity: 1 Procedure     PT G Codes:   PT G-Codes **NOT FOR INPATIENT CLASS** Functional Assessment Tool Used: clinical decision making  Functional Limitation: Mobility: Walking and moving around Mobility: Walking and Moving Around Current Status (Q9826): At least 1 percent but less than 20 percent impaired, limited or restricted Mobility: Walking and Moving Around Goal Status [phone]): At least 1 percent but less than 20 percent impaired, limited or restricted    Carney Living PT DPT  07/09/2016, 11:20 AM

## 2016-07-10 LAB — HCV COMMENT:

## 2016-07-10 LAB — HEPATITIS C ANTIBODY (REFLEX): HCV Ab: <0.1 s/co ratio (ref 0.0–0.9)

## 2016-07-10 LAB — HEPATITIS B SURFACE ANTIGEN: HEP B S AG: NEGATIVE

## 2016-07-12 ENCOUNTER — Inpatient Hospital Stay (HOSPITAL_COMMUNITY)
Admission: EM | Admit: 2016-07-12 | Discharge: 2016-07-18 | DRG: 640 | Disposition: A | Discharge status: Home Health | Payer: Self-pay | Attending: Internal Medicine | Admitting: Internal Medicine

## 2016-07-12 ENCOUNTER — Emergency Department (HOSPITAL_COMMUNITY): Payer: Self-pay

## 2016-07-12 ENCOUNTER — Encounter (HOSPITAL_COMMUNITY): Payer: Self-pay

## 2016-07-12 DIAGNOSIS — F0390 Unspecified dementia without behavioral disturbance: Secondary | ICD-10-CM | POA: Diagnosis present

## 2016-07-12 DIAGNOSIS — R569 Unspecified convulsions: Secondary | ICD-10-CM

## 2016-07-12 DIAGNOSIS — I129 Hypertensive chronic kidney disease with stage 1 through stage 4 chronic kidney disease, or unspecified chronic kidney disease: Secondary | ICD-10-CM | POA: Diagnosis present

## 2016-07-12 DIAGNOSIS — G9340 Encephalopathy, unspecified: Secondary | ICD-10-CM

## 2016-07-12 DIAGNOSIS — E512 Wernicke's encephalopathy: Principal | ICD-10-CM | POA: Diagnosis present

## 2016-07-12 DIAGNOSIS — Z952 Presence of prosthetic heart valve: Secondary | ICD-10-CM

## 2016-07-12 DIAGNOSIS — I330 Acute and subacute infective endocarditis: Secondary | ICD-10-CM | POA: Diagnosis present

## 2016-07-12 DIAGNOSIS — R471 Dysarthria and anarthria: Secondary | ICD-10-CM

## 2016-07-12 DIAGNOSIS — B955 Unspecified streptococcus as the cause of diseases classified elsewhere: Secondary | ICD-10-CM | POA: Diagnosis present

## 2016-07-12 DIAGNOSIS — K219 Gastro-esophageal reflux disease without esophagitis: Secondary | ICD-10-CM | POA: Diagnosis present

## 2016-07-12 DIAGNOSIS — Z888 Allergy status to other drugs, medicaments and biological substances status: Secondary | ICD-10-CM

## 2016-07-12 DIAGNOSIS — G459 Transient cerebral ischemic attack, unspecified: Secondary | ICD-10-CM | POA: Diagnosis present

## 2016-07-12 DIAGNOSIS — Z79899 Other long term (current) drug therapy: Secondary | ICD-10-CM

## 2016-07-12 DIAGNOSIS — M130 Polyarthritis, unspecified: Secondary | ICD-10-CM | POA: Diagnosis present

## 2016-07-12 DIAGNOSIS — B954 Other streptococcus as the cause of diseases classified elsewhere: Secondary | ICD-10-CM | POA: Diagnosis present

## 2016-07-12 DIAGNOSIS — Z7901 Long term (current) use of anticoagulants: Secondary | ICD-10-CM

## 2016-07-12 DIAGNOSIS — E785 Hyperlipidemia, unspecified: Secondary | ICD-10-CM | POA: Diagnosis present

## 2016-07-12 DIAGNOSIS — F4323 Adjustment disorder with mixed anxiety and depressed mood: Secondary | ICD-10-CM | POA: Diagnosis present

## 2016-07-12 DIAGNOSIS — I25810 Atherosclerosis of coronary artery bypass graft(s) without angina pectoris: Secondary | ICD-10-CM | POA: Diagnosis present

## 2016-07-12 DIAGNOSIS — I509 Heart failure, unspecified: Secondary | ICD-10-CM

## 2016-07-12 DIAGNOSIS — I482 Chronic atrial fibrillation: Secondary | ICD-10-CM | POA: Diagnosis present

## 2016-07-12 DIAGNOSIS — Z881 Allergy status to other antibiotic agents status: Secondary | ICD-10-CM

## 2016-07-12 DIAGNOSIS — Z8673 Personal history of transient ischemic attack (TIA), and cerebral infarction without residual deficits: Secondary | ICD-10-CM

## 2016-07-12 DIAGNOSIS — I4891 Unspecified atrial fibrillation: Secondary | ICD-10-CM | POA: Diagnosis present

## 2016-07-12 DIAGNOSIS — R9089 Other abnormal findings on diagnostic imaging of central nervous system: Secondary | ICD-10-CM

## 2016-07-12 DIAGNOSIS — Z823 Family history of stroke: Secondary | ICD-10-CM

## 2016-07-12 DIAGNOSIS — R7881 Bacteremia: Secondary | ICD-10-CM | POA: Diagnosis present

## 2016-07-12 DIAGNOSIS — Z792 Long term (current) use of antibiotics: Secondary | ICD-10-CM

## 2016-07-12 DIAGNOSIS — I679 Cerebrovascular disease, unspecified: Secondary | ICD-10-CM

## 2016-07-12 DIAGNOSIS — Z951 Presence of aortocoronary bypass graft: Secondary | ICD-10-CM

## 2016-07-12 DIAGNOSIS — N183 Chronic kidney disease, stage 3 (moderate): Secondary | ICD-10-CM | POA: Diagnosis present

## 2016-07-12 DIAGNOSIS — I639 Cerebral infarction, unspecified: Secondary | ICD-10-CM

## 2016-07-12 DIAGNOSIS — Z961 Presence of intraocular lens: Secondary | ICD-10-CM | POA: Diagnosis present

## 2016-07-12 LAB — CBC WITH DIFFERENTIAL/PLATELET
BASOS PCT: 0 %
Basophils Absolute: 0 10*3/uL (ref 0.0–0.1)
Eosinophils Absolute: 0.1 10*3/uL (ref 0.0–0.7)
Eosinophils Relative: 2 %
HEMATOCRIT: 31.2 % — AB (ref 36.0–46.0)
HEMOGLOBIN: 10.2 g/dL — AB (ref 12.0–15.0)
LYMPHS ABS: 0.7 10*3/uL (ref 0.7–4.0)
Lymphocytes Relative: 10 %
MCH: 27.2 pg (ref 26.0–34.0)
MCHC: 32.7 g/dL (ref 30.0–36.0)
MCV: 83.2 fL (ref 78.0–100.0)
MONO ABS: 0.8 10*3/uL (ref 0.1–1.0)
MONOS PCT: 11 %
NEUTROS ABS: 5.9 10*3/uL (ref 1.7–7.7)
NEUTROS PCT: 77 %
Platelets: 218 10*3/uL (ref 150–400)
RBC: 3.75 MIL/uL — ABNORMAL LOW (ref 3.87–5.11)
RDW: 15.5 % (ref 11.5–15.5)
WBC: 7.6 10*3/uL (ref 4.0–10.5)

## 2016-07-12 LAB — I-STAT CHEM 8, ED
BUN: 13 mg/dL (ref 6–20)
CALCIUM ION: 1.19 mmol/L (ref 1.15–1.40)
CREATININE: 1.3 mg/dL — AB (ref 0.44–1.00)
Chloride: 104 mmol/L (ref 101–111)
GLUCOSE: 101 mg/dL — AB (ref 65–99)
HCT: 30 % — ABNORMAL LOW (ref 36.0–46.0)
HEMOGLOBIN: 10.2 g/dL — AB (ref 12.0–15.0)
Potassium: 4.3 mmol/L (ref 3.5–5.1)
Sodium: 138 mmol/L (ref 135–145)
TCO2: 25 mmol/L (ref 0–100)

## 2016-07-12 LAB — COMPREHENSIVE METABOLIC PANEL
ALK PHOS: 65 U/L (ref 38–126)
ALT: 17 U/L (ref 14–54)
ANION GAP: 9 (ref 5–15)
AST: 23 U/L (ref 15–41)
Albumin: 3 g/dL — ABNORMAL LOW (ref 3.5–5.0)
BUN: 12 mg/dL (ref 6–20)
CALCIUM: 9.1 mg/dL (ref 8.9–10.3)
CHLORIDE: 105 mmol/L (ref 101–111)
CO2: 23 mmol/L (ref 22–32)
CREATININE: 1.2 mg/dL — AB (ref 0.44–1.00)
GFR calc non Af Amer: 42 mL/min — ABNORMAL LOW (ref >60)
GFR, EST AFRICAN AMERICAN: 48 mL/min — AB (ref >60)
GLUCOSE: 101 mg/dL — AB (ref 65–99)
Potassium: 4.3 mmol/L (ref 3.5–5.1)
SODIUM: 137 mmol/L (ref 135–145)
Total Bilirubin: 0.9 mg/dL (ref 0.3–1.2)
Total Protein: 7.2 g/dL (ref 6.5–8.1)

## 2016-07-12 LAB — URINALYSIS, ROUTINE W REFLEX MICROSCOPIC
BILIRUBIN URINE: NEGATIVE
GLUCOSE, UA: NEGATIVE mg/dL
HGB URINE DIPSTICK: NEGATIVE
KETONES UR: NEGATIVE mg/dL
Leukocytes, UA: NEGATIVE
Nitrite: NEGATIVE
PH: 6 (ref 5.0–8.0)
Protein, ur: NEGATIVE mg/dL
SPECIFIC GRAVITY, URINE: 1.011 (ref 1.005–1.030)

## 2016-07-12 LAB — I-STAT CG4 LACTIC ACID, ED: LACTIC ACID, VENOUS: 0.93 mmol/L (ref 0.5–1.9)

## 2016-07-12 LAB — PROTIME-INR
INR: 2.13
Prothrombin Time: 24.2 seconds — ABNORMAL HIGH (ref 11.4–15.2)

## 2016-07-12 MED ORDER — STROKE: EARLY STAGES OF RECOVERY BOOK
Freq: Once | Status: AC
  Administered 2016-07-12: 20:00:00

## 2016-07-12 MED ORDER — TORSEMIDE 5 MG PO TABS
2.5000 mg | ORAL_TABLET | ORAL | Status: DC
Start: 2016-07-12 — End: 2016-07-12
  Filled 2016-07-12: qty 0.5

## 2016-07-12 MED ORDER — GENTAMICIN SULFATE 40 MG/ML IJ SOLN
60.0000 mg | INTRAMUSCULAR | Status: DC
  Administered 2016-07-12 – 2016-07-16 (×5): 60 mg via INTRAVENOUS
  Filled 2016-07-12 (×6): qty 1.5

## 2016-07-12 MED ORDER — LORATADINE 10 MG PO TABS
10.0000 mg | ORAL_TABLET | Freq: Every day | ORAL | Status: DC
  Administered 2016-07-12 – 2016-07-18 (×7): 10 mg via ORAL
  Filled 2016-07-12 (×7): qty 1

## 2016-07-12 MED ORDER — GENTAMICIN SULFATE 40 MG/ML IJ SOLN
60.0000 mg | INTRAVENOUS | Status: DC
  Filled 2016-07-12: qty 1.5

## 2016-07-12 MED ORDER — RAMIPRIL 2.5 MG PO CAPS
2.5000 mg | ORAL_CAPSULE | Freq: Every day | ORAL | Status: DC
  Administered 2016-07-12 – 2016-07-18 (×7): 2.5 mg via ORAL
  Filled 2016-07-12 (×7): qty 1

## 2016-07-12 MED ORDER — ACETAMINOPHEN 325 MG PO TABS
650.0000 mg | ORAL_TABLET | ORAL | Status: DC | PRN
  Administered 2016-07-18: 650 mg via ORAL
  Filled 2016-07-12 (×2): qty 2

## 2016-07-12 MED ORDER — ACETAMINOPHEN 160 MG/5ML PO SOLN: 650.0000 mg | ORAL | Status: DC | PRN

## 2016-07-12 MED ORDER — PENICILLIN G POTASSIUM IV (FOR PTA / DISCHARGE USE ONLY): 12.0000 10*6.[IU] | Freq: Every day | INTRAVENOUS | Status: DC

## 2016-07-12 MED ORDER — WARFARIN - PHARMACIST DOSING INPATIENT
Freq: Every day | Status: DC
  Administered 2016-07-15 – 2016-07-17 (×2)

## 2016-07-12 MED ORDER — DEXTROSE 5 % IV SOLN
6.0000 10*6.[IU] | Freq: Two times a day (BID) | INTRAVENOUS | Status: DC
  Administered 2016-07-12 – 2016-07-13 (×2): 6 10*6.[IU] via INTRAVENOUS
  Filled 2016-07-12 (×2): qty 6

## 2016-07-12 MED ORDER — GENTAMICIN IV (FOR PTA / DISCHARGE USE ONLY): 60.0000 mg | INTRAVENOUS | Status: DC

## 2016-07-12 MED ORDER — ATORVASTATIN CALCIUM 10 MG PO TABS
10.0000 mg | ORAL_TABLET | Freq: Every day | ORAL | Status: DC
  Administered 2016-07-12 – 2016-07-14 (×3): 10 mg via ORAL
  Filled 2016-07-12 (×3): qty 1

## 2016-07-12 MED ORDER — WARFARIN SODIUM 5 MG PO TABS
2.5000 mg | ORAL_TABLET | Freq: Once | ORAL | Status: AC
  Administered 2016-07-12: 2.5 mg via ORAL
  Filled 2016-07-12: qty 1

## 2016-07-12 MED ORDER — BISOPROLOL FUMARATE 5 MG PO TABS
2.5000 mg | ORAL_TABLET | Freq: Every day | ORAL | Status: DC
  Administered 2016-07-13 – 2016-07-18 (×6): 2.5 mg via ORAL
  Filled 2016-07-12 (×8): qty 0.5

## 2016-07-12 MED ORDER — ACETAMINOPHEN 650 MG RE SUPP: 650.0000 mg | RECTAL | Status: DC | PRN

## 2016-07-12 MED ORDER — SODIUM CHLORIDE 0.9% FLUSH
10.0000 mL | INTRAVENOUS | Status: DC | PRN
  Administered 2016-07-17 – 2016-07-18 (×3): 10 mL
  Filled 2016-07-12 (×3): qty 40

## 2016-07-12 MED ORDER — ASPIRIN EC 325 MG PO TBEC
325.0000 mg | DELAYED_RELEASE_TABLET | Freq: Once | ORAL | Status: AC
  Administered 2016-07-12: 325 mg via ORAL
  Filled 2016-07-12: qty 1

## 2016-07-12 MED ORDER — VITAMIN D 1000 UNITS PO TABS
2000.0000 [IU] | ORAL_TABLET | Freq: Every day | ORAL | Status: DC
  Administered 2016-07-12 – 2016-07-18 (×7): 2000 [IU] via ORAL
  Filled 2016-07-12 (×7): qty 2

## 2016-07-12 NOTE — H&P (Signed)
TRH H&P   Patient Demographics:    Alexis [name], is a 83 y.o. female  MRN: [id number]   DOB - 24-Jul-1933  Admit Date - 07/12/2016  Outpatient Primary MD for the patient is [name], MD  Referring MD/NP/PA: Dr Kenton Kingfisher  Patient coming from: Home  Chief Complaint  Patient presents with  . Weakness    Pt was recently tx and discharged for endocarditis comes in today for weakness      HPI:    Alexis [name]  is a 83 y.o. female, with medical history significant of atrial fibrillation, CAD, CVA, mitral valve replacement, CABG recently admitted for strep bacteremia with mitral valve endocarditis, another admission secondary to reaction to Rocephin that was changed to ampicillin and gentamicin, was just discharged 07/09/2016, a shunt is Russian-speaking, history obtained from the daughter, reports she tried to call her mother where she did not answer, she was hard to arouse, almost took 15 minutes to wake her up, she was confused, incoherent, she was able to stand up, she reports generalized weakness, daughter did not notice any focal deficits(daughter actually checked her extremity motor strength, and facial muscle strength with no facial droop), ports she is currently more apparent and mental status Improved, but she reports still some minimal confusion, patient afebrile, denies any cough, dysuria, polyuria, chest pain, shortness of breath, nausea or vomiting, denies any focal deficits, tingling or numbness. In ED CT head with no acute findings, no significant labs abnormalities, she received full dose aspirin and I was called to admit.    Review of systems:    In addition to the HPI above,  No Fever-chills, No Headache, No changes with hearing,Ports worsening visual acuity(chronic problem but much worsened today) No problems swallowing food or Liquids, No Chest pain, Cough  or Shortness of Breath, No Abdominal pain, No Nausea or Vommitting, Bowel movements are regular, No Blood in stool or Urine, No dysuria, No new skin rashes or bruises, No new joints pains-aches,  No new weakness, tingling, numbness in any extremity, report generalized weakness, but denies any focal specific deficits No recent weight gain or loss, No polyuria, polydypsia or polyphagia, Reports mother has depression and anxiety, as well confusion earlier today  A full 10 point Review of Systems was done, except as stated above, all other Review of Systems were negative.   With Past History of the following :    Past Medical History:  Diagnosis Date  . Anxiety   . Atrial fibrillation ([place])   . CAD (coronary artery disease) of artery bypass graft   . CVA (cerebral vascular accident) ([place]) 05/2000   "memory issues, dizziness since" (07/08/2016  . Depression   . Dizzinesses    "often since [facility] 2009" (07/08/2016)  . Endocarditis dx'd 05/2016  . Foot fracture, left    "no OR; from fall"  . GERD (gastroesophageal reflux disease)  hx  . High cholesterol   . History of blood transfusion ~ 1960  . Hypertension   . Pneumonia 1-2 X  . Polyarthritis rheumatica (HCC)       Past Surgical History:  Procedure Laterality Date  . AORTIC VALVE REPLACEMENT  03/2008  . CARDIAC VALVE REPLACEMENT    . CATARACT EXTRACTION W/ INTRAOCULAR LENS IMPLANT Left   . CORONARY ANGIOPLASTY  2009  . CORONARY ARTERY BYPASS GRAFT  03/2008  . MITRAL VALVE REPAIR (MV)/CORONARY ARTERY BYPASS GRAFTING (CABG)  03/2008   2009 [facility]  . TEE WITHOUT CARDIOVERSION N/A 06/26/2016   Procedure: TRANSESOPHAGEAL ECHOCARDIOGRAM (TEE);  Surgeon: Josue Hector, MD;  Location: [facility] ENDOSCOPY;  Service: Cardiovascular;  Laterality: N/A;      Social History:     Social History  Substance Use Topics  . Smoking status: Never Smoker  . Smokeless tobacco: Never Used  . Alcohol use No     Lives - At  home  Mobility - with daughter     Family History :     Family History  Problem Relation Age of Onset  . Stroke Maternal Grandmother   . CAD Neg Hx      Home Medications:   Prior to Admission medications   Medication Sig Start Date End Date Taking? Authorizing Provider  atorvastatin (LIPITOR) 10 MG tablet Take 1 tablet (10 mg total) by mouth daily. 09/07/14  Yes [name], MD  bisoprolol (ZEBETA) 5 MG tablet Take 0.5 tablets (2.5 mg total) by mouth daily. Patient taking differently: Take 1.25 mg by mouth daily.  08/29/14  Yes [name], MD  Cholecalciferol (VITAMIN D3) 2000 units capsule Take 1 capsule (2,000 Units total) by mouth daily. 06/06/16  Yes [name], MD  gentamicin (GARAMYCIN) IVPB Inject 60 mg into the vein daily. Indication: 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Last Day of Therapy:  08/05/16 Labs - Sunday/Monday:  CBC/D, BMP, and gentamicin trough. Labs - Thursday:  BMP and gentamicin trough Labs - Every other week:  ESR and CRP 07/09/16 08/05/16 Yes Florencia Reasons, MD  loratadine (CLARITIN) 10 MG tablet Take 1 tablet (10 mg total) by mouth daily. 06/06/16 06/06/17 Yes [name], MD  penicillin G IVPB Inject 12 Million Units into the vein daily. as continuous infusion. Indication: 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Last Day of Therapy:  08/05/16 Labs - Once weekly:  CBC/D and BMP, Labs - Every other week:  ESR and CRP 07/09/16 08/05/16 Yes Florencia Reasons, MD  ramipril (ALTACE) 2.5 MG capsule Take 1 capsule (2.5 mg total) by mouth daily. 07/09/16  Yes Florencia Reasons, MD  torsemide [facility]) 5 MG tablet Take 0.5 tablets (2.5 mg total) by mouth every Monday, Wednesday, and Friday. 07/10/16  Yes Florencia Reasons, MD  triamcinolone ointment (KENALOG) 0.5 % Apply 1 application topically 2 (two) times daily. 06/06/16 06/06/17 Yes [name], MD  warfarin (COUMADIN) 2.5 MG tablet Take as directed by anticoagulation clinic Patient taking differently: Take 1.25-2.5 mg by mouth See  admin instructions. 1.25 mg in the evening on Sun/Tues/Thurs/Sat and 2.5 mg on Mon/Wed/Fri 09/07/14  Yes [name], MD  feeding supplement, ENSURE ENLIVE, (ENSURE ENLIVE) LIQD Take 237 mLs by mouth 2 (two) times daily between meals. Patient not taking: Reported on 07/12/2016 07/09/16   Florencia Reasons, MD  gabapentin (NEURONTIN) 100 MG capsule Take 1 capsule (100 mg total) by mouth 2 (two) times daily. Patient not taking: Reported on 07/12/2016 07/09/16   Florencia Reasons, MD  Allergies:     Allergies  Allergen Reactions  . Ceftriaxone Other (See Comments)    Per MD progress note 2/12:  Infusion reaction.  Denies other associated symptoms such as diaphoresis, nausea, vomiting, dysuria, diarrhea, lower abdominal pain, received palpitations, swelling, difficulty breathing  . Hydralazine Hcl Other (See Comments)    Headache      Physical Exam:   Vitals  Blood pressure 170/82, pulse 76, temperature 97.5 F (36.4 C), temperature source Oral, resp. rate 21, height 5' (1.524 m), weight 65.8 kg (145 lb), SpO2 100 %.   1. General Well-developed elderly female lying in bed in NAD,   2.  Awake Alert, Oriented X 3.  3. Very minimal left facial droop, Strength 5/5 all 4 extremities, Sensation intact all 4 extremities, Plantars down going.  4. Ears and Eyes appear Normal, Conjunctivae clear, PERRLA. Moist Oral Mucosa.  5. Supple Neck, No JVD, No cervical lymphadenopathy appriciated, No Carotid Bruits.  6. Symmetrical Chest wall movement, Good air movement bilaterally, CTAB.  7. Irregular irregular, No Gallops, Rubs or Murmurs, No Parasternal Heave.  8. Positive Bowel Sounds, Abdomen Soft, No tenderness, No organomegaly appriciated,No rebound -guarding or rigidity.  9.  No Cyanosis, Normal Skin Turgor, No Skin Rash or Bruise.  10. Good muscle tone,  joints appear normal , no effusions, Normal ROM.  11. No Palpable Lymph Nodes in Neck or Axillae     Data Review:    CBC  Recent Labs Lab  07/08/16 0525 07/09/16 0548 07/12/16 1326 07/12/16 1336  WBC 4.3 5.9 7.6  --   HGB 10.7* 9.7* 10.2* 10.2*  HCT 32.2* 29.5* 31.2* 30.0*  PLT 187 168 218  --   MCV 80.1 81.3 83.2  --   MCH 26.6 26.7 27.2  --   MCHC 33.2 32.9 32.7  --   RDW 14.8 15.1 15.5  --   LYMPHSABS 0.6*  --  0.7  --   MONOABS 0.4  --  0.8  --   EOSABS 0.1  --  0.1  --   BASOSABS 0.0  --  0.0  --    ------------------------------------------------------------------------------------------------------------------  Chemistries   Recent Labs Lab 07/08/16 0525 07/09/16 0548 07/12/16 1326 07/12/16 1336  NA 136 130* 137 138  K 3.6 3.6 4.3 4.3  CL 102 98* 105 104  CO2 19* 23 23  --   GLUCOSE 110* 128* 101* 101*  BUN _0 CREATININE 1.43* 1.15* 1.20* 1.30*  CALCIUM 9.2 8.5* 9.1  --   AST _1 --   ALT _2 --   ALKPHOS 63 60 65  --   BILITOT 0.9 0.6 0.9  --    ------------------------------------------------------------------------------------------------------------------ estimated creatinine clearance is 29.2 mL/min (by C-G formula based on SCr of 1.3 mg/dL (H)). ------------------------------------------------------------------------------------------------------------------ No results for input(s): TSH, T4TOTAL, T3FREE, THYROIDAB in the last 72 hours.  Invalid input(s): FREET3  Coagulation profile  Recent Labs Lab 07/08/16 0525 07/09/16 0548  INR 1.71 2.08   ------------------------------------------------------------------------------------------------------------------- No results for input(s): DDIMER in the last 72 hours. -------------------------------------------------------------------------------------------------------------------  Cardiac Enzymes No results for input(s): CKMB, TROPONINI, MYOGLOBIN in the last 168 hours.  Invalid input(s): CK ------------------------------------------------------------------------------------------------------------------ No  results found for: BNP   ---------------------------------------------------------------------------------------------------------------  Urinalysis    Component Value Date/Time   COLORURINE YELLOW 07/12/2016 [place] 07/12/2016 1513   LABSPEC 1.011 07/12/2016 1513   PHURINE 6.0 07/12/2016 1513   GLUCOSEU NEGATIVE 07/12/2016 1513   GLUCOSEU  NEGATIVE 06/20/2016 [place] 07/12/2016 1513   [place] 07/12/2016 1513   KETONESUR NEGATIVE 07/12/2016 1513   PROTEINUR NEGATIVE 07/12/2016 1513   UROBILINOGEN 1.0 06/20/2016 1637   NITRITE NEGATIVE 07/12/2016 1513   LEUKOCYTESUR NEGATIVE 07/12/2016 1513    ----------------------------------------------------------------------------------------------------------------   Imaging Results:    Dg Chest 2 View  Result Date: 07/12/2016 CLINICAL DATA:  Sepsis, generalized weakness, recent flu symptoms, history hypertension, atrial fibrillation, stroke, coronary artery disease EXAM: CHEST  2 VIEW COMPARISON:  07/08/2016 FINDINGS: RIGHT arm PICC line tip projects over SVC. Minimal enlargement of cardiac silhouette post CABG, MVR, and AVR. Atherosclerotic calcification aorta. Mild pulmonary vascular congestion. Bibasilar atelectasis. Lungs otherwise clear. No pleural effusion or pneumothorax. Osseous demineralization. IMPRESSION: Minimal enlargement of cardiac silhouette post CABG, AVR and MVR. Aortic atherosclerosis. Bibasilar atelectasis. Electronically Signed   By: Lavonia Dana M.D.   On: 07/12/2016 13:58   Ct Head Wo Contrast  Result Date: 07/12/2016 CLINICAL DATA:  Confusion, lethargy, body weakness and slurred speech beginning today, history stroke, coronary artery disease post CABG, atrial fibrillation, hypertension EXAM: CT HEAD WITHOUT CONTRAST TECHNIQUE: Contiguous axial images were obtained from the base of the skull through the vertex without intravenous contrast. COMPARISON:  None FINDINGS: Brain:  Generalized atrophy. Normal ventricular morphology. No midline shift or mass effect. Mild small vessel chronic ischemic changes of deep cerebral white matter. No intracranial hemorrhage, mass lesion or evidence acute infarction. No extra-axial fluid collections. Vascular: Extensive atherosclerotic calcification of internal carotid and vertebrobasilar arteries at skullbase. Skull: Intact Sinuses/Orbits: Clear Other: N/A IMPRESSION: Atrophy with mild small vessel chronic ischemic changes of deep cerebral white matter. No acute intracranial abnormalities. Electronically Signed   By: Lavonia Dana M.D.   On: 07/12/2016 16:20    My personal review of EKG: Rhythm A fib, Rate  73 /min, QTc 458 , no Acute ST changes   Assessment & Plan:    Active Problems:   Congestive heart failure (HCC)   Bacteremia due to Streptococcus   Atrial fibrillation (HCC)   Streptococcal endocarditis   Mitral valve replaced   Adjustment disorder with mixed anxiety and depressed mood   TIA (transient ischemic attack)  Encephalopathy/TIA - An episode of confusion, slurred speech, generalized weakness, and worsening visual acuity ,etiology is no acute findings much she is afebrile, no evidence of active infection, negative urinalysis, lactic acid within normal limits, his x-ray with no evidence of acute cardiopulmonary disease. - Will be admitted to rule out TIA, CT head with no acute findings, will obtain MRI brain, MRA head, bilateral carotid Dopplers, hemoglobin A1c and lipid panel, she just had echo done last month, so no need to repeat, seems 1 dose of aspirin in NAD, neurology consulted  Streptococcus viridans bacteremia with mitral valve endocarditis - Continue with IV antibiotic regimen including IV ampicillin and IV gentamicin through PICC line(antibiotic regimen recently change secondary to reaction to Rocephin)  Hypertension - Blood pressure acceptable, continue with home medication  Atrial fibrillation -  CHA2DS2VASC score 6 - Continue bisoprolol for rate control. - On warfarin, pharmacy to dose  CKD stage III - At baseline, continue to monitor  Hyperlipidemia - Continue with Lipitor  DVT Prophylaxis on warfarin  AM Labs Ordered, also please review Full Orders  Family Communication: Admission, patients condition and plan of care including tests being ordered have been discussed with the patient and daughter who indicate understanding and agree with the plan and Code Status.  Code Status Full  Likely  DC to  Home  Condition GUARDED    Consults called: neurology  Admission status: Observation  Time spent in minutes : 55 minutes   Izaan Kingbird M.D on 07/12/2016 at 5:20 PM  Between 7am to 7pm - Pager - [phone]. After 7pm go to [email or web address] - password [facility]  [facility] - Office  [phone]

## 2016-07-12 NOTE — ED Provider Notes (Signed)
[name] DEPT Provider Note   CSN: [id number] Arrival date & time: 07/12/16  1238     History   Chief Complaint   HPI Alexis [name] is a 83 y.o. [name] There is a language barrier as the patient speaks [place] and her daughter provides translation. She has a history of mitral valve replacement and was admitted for septic endocarditis. Patient is currently on parenteral antibiotics. She came back 2 days ago and was admitted. To be a reaction to Rocephin, which included diffuse body pain. Patient was switched to penicillin and gentamicin, which has been working well for her. She is brought in today because her daughter states that she was calling her mother, but she would not pick up the phone. She asked her husband to go check on her and she was minimal use responsive. Very somnolent and difficult to arouse. She is not taking any narcotic medications. Review of the MR shows that there is some concern for anxiety and depression. Her daughter states that she has been depressed. She states that she did not want to move to a married but has needed to because of her age and health status. She is also socially isolated. Because of her language barrier. The patient states that she feels that she is "annoying" everyone and that she is a burden to her family. She states that she "just wants to sleep for the rest of her life." Her daughter states that she has also been hallucinating and yesterday became upset because she thought someone and was being attacked and Talking about having needs to go to help her. Also, on the way to the hospital. She kept talking about how they needed to get there to help support people [place]. She has no significant psychiatric history or history of dementia. She is also complaining of central vision loss. Her daughter states that she may have complained of this before, however, it has become more significant to the patient. No other neurologic deficits, patient has  not been running fevers.  Patient may have had some slurred speech upon waking this morning. Speech Garbled and dysarthric.  It was transient lasting just a few minutes. HPI  Past Medical History:  Diagnosis Date  . Anxiety   . Atrial fibrillation ([place])   . CAD (coronary artery disease) of artery bypass graft   . CVA (cerebral vascular accident) ([place]) 05/2000   "memory issues, dizziness since" (07/08/2016  . Depression   . Dizzinesses    "often since OHS 2009" (07/08/2016)  . Endocarditis dx'd 05/2016  . Foot fracture, left    "no OR; from fall"  . GERD (gastroesophageal reflux disease)    hx  . High cholesterol   . History of blood transfusion ~ 1960  . Hypertension   . Pneumonia 1-2 X  . Polyarthritis rheumatica [facility])     Patient Active Problem List   Diagnosis Date Noted  . Adjustment disorder with mixed anxiety and depressed mood 07/09/2016  . Medication reaction 07/08/2016  . Streptococcal endocarditis 07/08/2016  . Mitral valve replaced 07/08/2016  . Acute on chronic renal insufficiency 07/08/2016  . Chronic atrial fibrillation ([place]) 07/08/2016  . 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([place])   . Bacteremia due to Streptococcus 06/23/2016  . Atrial fibrillation ([place])   . Positive blood cultures 06/22/2016  . Shoulder blade pain 06/06/2016  . Eczema 06/06/2016  . Chest wall pain 06/06/2016  . Gastroenteritis 06/20/2014  . Orthostatic hypotension 06/20/2014  . Hyponatremia 06/20/2014  . Memory loss 08/16/2013  .  Encounter for therapeutic drug monitoring 07/06/2013  . Long term current use of anticoagulant therapy 06/17/2012  . Encounter for monitoring coumadin therapy 03/30/2012  . Essential hypertension 05/31/2008  . Coronary atherosclerosis 05/31/2008  . Congestive heart failure ([place]) 05/31/2008  . CONSTIPATION 05/31/2008  . Myalgia 05/31/2008    Past Surgical History:  Procedure Laterality Date  . AORTIC VALVE REPLACEMENT  03/2008  . CARDIAC VALVE REPLACEMENT      . CATARACT EXTRACTION W/ INTRAOCULAR LENS IMPLANT Left   . CORONARY ANGIOPLASTY  2009  . CORONARY ARTERY BYPASS GRAFT  03/2008  . MITRAL VALVE REPAIR (MV)/CORONARY ARTERY BYPASS GRAFTING (CABG)  03/2008   2009 [facility]  . TEE WITHOUT CARDIOVERSION N/A 06/26/2016   Procedure: TRANSESOPHAGEAL ECHOCARDIOGRAM (TEE);  Surgeon: Josue Hector, MD;  Location[facility] ENDOSCOPY;  Service: Cardiovascular;  Laterality: N/A;    OB History    No data available       Home Medications    Prior to Admission medications   Medication Sig Start Date End Date Taking? Authorizing Provider  acetaminophen (TYLENOL) 500 MG tablet Take 500-1,000 mg by mouth 2 (two) times daily as needed for fever.     Historical Provider, MD  atorvastatin (LIPITOR) 10 MG tablet Take 1 tablet (10 mg total) by mouth daily. 09/07/14   [name], MD  bisoprolol (ZEBETA) 5 MG tablet Take 0.5 tablets (2.5 mg total) by mouth daily. Patient taking differently: Take 1.25 mg by mouth daily.  08/29/14   [name], MD  Cholecalciferol (VITAMIN D3) 2000 units capsule Take 1 capsule (2,000 Units total) by mouth daily. Patient not taking: Reported on 07/08/2016 06/06/16   [name], MD  feeding supplement, ENSURE ENLIVE, (ENSURE ENLIVE) LIQD Take 237 mLs by mouth 2 (two) times daily between meals. 07/09/16   Florencia Reasons, MD  gabapentin (NEURONTIN) 100 MG capsule Take 1 capsule (100 mg total) by mouth 2 (two) times daily. 07/09/16   Florencia Reasons, MD  gentamicin (GARAMYCIN) IVPB Inject 60 mg into the vein daily. Indication: 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Last Day of Therapy:  08/05/16 Labs - Sunday/Monday:  CBC/D, BMP, and gentamicin trough. Labs - Thursday:  BMP and gentamicin trough Labs - Every other week:  ESR and CRP 07/09/16 08/05/16  Florencia Reasons, MD  loratadine (CLARITIN) 10 MG tablet Take 1 tablet (10 mg total) by mouth daily. Patient not taking: Reported on 06/22/2016 06/06/16 06/06/17  [name], MD  penicillin  G IVPB Inject 12 Million Units into the vein daily. as continuous infusion. Indication: 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Last Day of Therapy:  08/05/16 Labs - Once weekly:  CBC/D and BMP, Labs - Every other week:  ESR and CRP 07/09/16 08/05/16  Florencia Reasons, MD  ramipril (ALTACE) 2.5 MG capsule Take 1 capsule (2.5 mg total) by mouth daily. 07/09/16   Florencia Reasons, MD  torsemide [facility]) 5 MG tablet Take 0.5 tablets (2.5 mg total) by mouth every Monday, Wednesday, and Friday. 07/10/16   Florencia Reasons, MD  triamcinolone ointment (KENALOG) 0.5 % Apply 1 application topically 2 (two) times daily. Patient not taking: Reported on 06/22/2016 06/06/16 06/06/17  [name] V, MD  warfarin (COUMADIN) 2.5 MG tablet Take as directed by anticoagulation clinic Patient taking differently: Take 1.25-2.5 mg by mouth See admin instructions. 1.25 mg in the evening on Sun/Tues/Thurs/Sat and 2.5 mg on Mon/Wed/Fri 09/07/14   [name], MD    Family History Family History  Problem Relation Age of Onset  .  Stroke Maternal Grandmother   . CAD Neg Hx     Social History Social History  Substance Use Topics  . Smoking status: Never Smoker  . Smokeless tobacco: Never Used  . Alcohol use No     Allergies   Ceftriaxone and Hydralazine hcl   Review of Systems Review of Systems  Ten systems reviewed and are negative for acute change, except as noted in the HPI.   Physical Exam Updated Vital Signs BP 154/90 (BP Location: Left Arm)   Pulse 76   Temp 97.5 F (36.4 C) (Oral)   Resp 16   Ht 5' (1.524 m)   Wt 65.8 kg   SpO2 97%   BMI 28.32 kg/m   Physical Exam  Constitutional: She is oriented to person, place, and time. She appears well-developed and well-nourished. No distress.  HENT:  Head: Normocephalic and atraumatic.  Eyes: Conjunctivae are normal. No scleral icterus.  Neck: Normal range of motion.  Cardiovascular: Normal rate, regular rhythm and normal heart sounds.  Exam reveals no gallop and no  friction rub.   No murmur heard. Pulmonary/Chest: Effort normal and breath sounds normal. No respiratory distress.  Abdominal: Soft. Bowel sounds are normal. She exhibits no distension and no mass. There is no tenderness. There is no guarding.  Neurological: She is alert and oriented to person, place, and time.  Speech is clear and goal oriented, follows commands Major Cranial nerves without deficit, no facial droop Normal strength in upper and lower extremities bilaterally including dorsiflexion and plantar flexion, strong and equal grip strength Sensation normal to light and sharp touch Moves extremities without ataxia, coordination intact    Skin: Skin is warm and dry. She is not diaphoretic.     ED Treatments / Results  Labs (all labs ordered are listed, but only abnormal results are displayed) Labs Reviewed  COMPREHENSIVE METABOLIC PANEL - Abnormal; Notable for the following:       Result Value   Glucose, Bld 101 (*)    Creatinine, Ser 1.20 (*)    Albumin 3.0 (*)    GFR calc non Af Amer 42 (*)    GFR calc Af Amer 48 (*)    All other components within normal limits  CBC WITH DIFFERENTIAL/PLATELET - Abnormal; Notable for the following:    RBC 3.75 (*)    Hemoglobin 10.2 (*)    HCT 31.2 (*)    All other components within normal limits  I-STAT CHEM 8, ED - Abnormal; Notable for the following:    Creatinine, Ser 1.30 (*)    Glucose, Bld 101 (*)    Hemoglobin 10.2 (*)    HCT 30.0 (*)    All other components within normal limits  CULTURE, BLOOD (ROUTINE X 2)  CULTURE, BLOOD (ROUTINE X 2)  URINE CULTURE  URINALYSIS, ROUTINE W REFLEX MICROSCOPIC  I-STAT CG4 LACTIC ACID, ED    EKG  EKG Interpretation None       Radiology Dg Chest 2 View  Result Date: 07/12/2016 CLINICAL DATA:  Sepsis, generalized weakness, recent flu symptoms, history hypertension, atrial fibrillation, stroke, coronary artery disease EXAM: CHEST  2 VIEW COMPARISON:  07/08/2016 FINDINGS: RIGHT arm  PICC line tip projects over SVC. Minimal enlargement of cardiac silhouette post CABG, MVR, and AVR. Atherosclerotic calcification aorta. Mild pulmonary vascular congestion. Bibasilar atelectasis. Lungs otherwise clear. No pleural effusion or pneumothorax. Osseous demineralization. IMPRESSION: Minimal enlargement of cardiac silhouette post CABG, AVR and MVR. Aortic atherosclerosis. Bibasilar atelectasis. Electronically Signed   By: Elta [place]  Thornton Papas M.D.   On: 07/12/2016 13:58    Procedures Procedures (including critical care time)  Medications Ordered in ED Medications - No data to display   Initial Impression / Assessment and Plan / ED Course  I have reviewed the triage vital signs and the nursing notes.  Pertinent labs & imaging results that were available during my care of the patient were reviewed by me and considered in my medical decision making (see chart for details).     Patient with TIA like sxs.? Depression with psychotic overtones.  I have given sign out to Dr. Melina Copa ([facility] resident).   Final Clinical Impressions(s) / ED Diagnoses   Final diagnoses:  None    New Prescriptions New Prescriptions   No medications on file     Margarita Mail, PA-C 07/12/16 [place], DO 07/13/16 [id number]

## 2016-07-12 NOTE — ED Notes (Signed)
Attempted report x1. 

## 2016-07-12 NOTE — Consult Note (Signed)
Neurology Consultation Reason for Consult: Altered mental status Referring Physician: Elgergawy, D  CC: Altered mental status  History is obtained from: Patient  HPI: Alexis [name] is a 83 y.o. female with a history of mitral valve replacement who was recently admitted with bacteremia with endocarditis. She was started on ceftriaxone, but returned with complaints of not tolerating it due to pain, therefore is changed to penicillin.  Today, family had difficulty arousing her and she has been confused all day. She apparently does not remember much of the past few days, but does have good memory prior to that. Daughter denies any hallucinations. She has been very sleepy all day.   ROS:  Unable to obtain due to altered mental status.   Past Medical History:  Diagnosis Date  . Anxiety   . Atrial fibrillation ([place])   . CAD (coronary artery disease) of artery bypass graft   . CVA (cerebral vascular accident) ([place]) 05/2000   "memory issues, dizziness since" (07/08/2016  . Depression   . Dizzinesses    "often since OHS 2009" (07/08/2016)  . Endocarditis dx'd 05/2016  . Foot fracture, left    "no OR; from fall"  . GERD (gastroesophageal reflux disease)    hx  . High cholesterol   . History of blood transfusion ~ 1960  . Hypertension   . Pneumonia 1-2 X  . Polyarthritis rheumatica ([facility])      Family History  Problem Relation Age of Onset  . Stroke Maternal Grandmother   . CAD Neg Hx      Social History:  reports that she has never smoked. She has never used smokeless tobacco. She reports that she does not drink alcohol or use drugs.   Exam: Current vital signs: BP 136/77 (BP Location: Left Arm)   Pulse 76   Temp 98.4 F (36.9 C) (Oral)   Resp 19   Ht 5' (1.524 m)   Wt 65.8 kg (145 lb)   SpO2 100%   BMI 28.32 kg/m  Vital signs in last 24 hours: Temp:  [97.5 F (36.4 C)-98.4 F (36.9 C)] 98.4 F (36.9 C) (02/16 2200) Pulse Rate:  [76-81] 76 (02/16 2200) Resp:   [16-29] 19 (02/16 2200) BP: (136-170)/(64-90) 136/77 (02/16 2200) SpO2:  [95 %-100 %] 100 % (02/16 2200) Weight:  [65.8 kg (145 lb)] 65.8 kg (145 lb) (02/16 1248)   Physical Exam  Constitutional: Appears well-developed and well-nourished.  Psych: Affect appropriate to situation Eyes: No scleral injection HENT: No OP obstrucion Head: Normocephalic.  Cardiovascular: Normal rate and regular rhythm.  Respiratory: Effort normal and breath sounds normal to anterior ascultation GI: Soft.  No distension. There is no tenderness.  Skin: WDI  Neuro: Mental Status: Patient is very lethargic, with repeated stimulation, she does follow commands to squeeze fingers, but no more complicated commands. Cranial Nerves: II: She keeps her eyes tightly shut, does not fixate or track, does not blink to threat. Pupils are equal, round, and reactive to light.   III,IV, VI: She crosses midline in both directions, but called to evaluate both eyes at the same time. V: Facial sensation is symmetric to temperature VII: Facial movement is symmetric.  Motor: She moves all extremities purposefully Sensory: She withdraws to noxious stimulation in all 4 extremities Cerebellar: No clear ataxia, but she does not produce pain with formal testing  Her neck is supple, able to flex chin to chest without difficulty.   have reviewed labs in epic and the results pertinent to this consultation are:  Borderline elevated creatinine  I have reviewed the images obtained: CT head-unremarkable  Impression: 83 year old female with new onset encephalopathy in the setting of bacteremia. Possibilities do include septic emboli, hypoactive delirium due to infection, but I would also consider penicillin induced encephalopathy given that the change occurred a few days after starting penicillin.  Recommendations: 1) MRI brain 2) EEG 3) repeat blood cultures 4) TSH, ammonia 5) if no other explanation is found, would consider  changing from penicillin to another agent.   Roland Rack, MD [facility] [phone]  If 7pm- 7am, please page neurology on call as listed in [place].

## 2016-07-12 NOTE — ED Provider Notes (Signed)
  Physical Exam  BP 170/82   Pulse 76   Temp 97.5 F (36.4 C) (Oral)   Resp 21   Ht 5' (1.524 m)   Wt 65.8 kg   SpO2 100%   BMI 28.32 kg/m   Physical Exam  ED Course  Procedures  MDM Assumed care of patient at approximately 4 PM. Patient has a history of mitral valve replacement. She was recently admitted with endocarditis. She is currently receiving antibiotics through a PICC line at home. She presented today to increased somnolence and difficulty arousing. When she was finally awoken earlier today, she had mild dysarthria which has since resolved. She was also having some hallucinations yesterday. She does endorse depression.  Lab workup today is unremarkable. CT head is pending. Plan is to consult neuro for TIA workup following completion of the Ct.  CT negative for acute changes. Neuro consulted who agrees with TIA workup. Hospitalist consutled for admission.       Clifton James, MD 07/12/16 [place] Liu, MD 07/13/16 [phone]

## 2016-07-12 NOTE — ED Notes (Signed)
Per Dr. Waldron Labs PICC line may be used and pt does not need to have IV.

## 2016-07-12 NOTE — Progress Notes (Addendum)
ANTICOAGULATION CONSULT NOTE - Initial Consult  Pharmacy Consult for warfarin Indication: atrial fibrillation  Allergies  Allergen Reactions  . Ceftriaxone Other (See Comments)    Per MD progress note 2/12:  Infusion reaction.  Denies other associated symptoms such as diaphoresis, nausea, vomiting, dysuria, diarrhea, lower abdominal pain, received palpitations, swelling, difficulty breathing  . Hydralazine Hcl Other (See Comments)    Headache     Patient Measurements: Height: 5' (152.4 cm) Weight: 145 lb (65.8 kg) IBW/kg (Calculated) : 45.5  Vital Signs: Temp: 97.5 F (36.4 C) (02/16 1242) Temp Source: Oral (02/16 1242) BP: 170/82 (02/16 1545) Pulse Rate: 76 (02/16 1545)  Labs:  Recent Labs  07/12/16 1326 07/12/16 1336  HGB 10.2* 10.2*  HCT 31.2* 30.0*  PLT 218  --   CREATININE 1.20* 1.30*    Estimated Creatinine Clearance: 29.2 mL/min (by C-G formula based on SCr of 1.3 mg/dL (H)).   Medical History: Past Medical History:  Diagnosis Date  . Anxiety   . Atrial fibrillation ([place])   . CAD (coronary artery disease) of artery bypass graft   . CVA (cerebral vascular accident) ([place]) 05/2000   "memory issues, dizziness since" (07/08/2016  . Depression   . Dizzinesses    "often since OHS 2009" (07/08/2016)  . Endocarditis dx'd 05/2016  . Foot fracture, left    "no OR; from fall"  . GERD (gastroesophageal reflux disease)    hx  . High cholesterol   . History of blood transfusion ~ 1960  . Hypertension   . Pneumonia 1-2 X  . Polyarthritis rheumatica ([facility])     Assessment: 74 yoF on warfarin PTA for atrial fibrillation/mitral valve repair presents with somnolence and difficult arousing. Pt is to resume warfarin per pharmacy. INR on admit therapeutic at 2.13, Hgb 10.2 but appears close to baseline, CT negative for acute findings. Per daughter last dose was yesterday evening.  PTA Warfarin Dose = 2.5mg  Mon/Wed/Fri, 1.25mg  Tues/Thurs/Sat/Sun  Goal of Therapy:  INR  2-3 Monitor platelets by anticoagulation protocol: Yes   Plan:  -Warfarin 2.5mg  PO x1 tonight -Monitor INR, S/Sx bleeding daily  Arrie Senate, PharmD PGY-1 Pharmacy Resident Pager: [phone] 07/12/2016

## 2016-07-12 NOTE — Progress Notes (Signed)
Pharmacy Antibiotic Note  Alexis [name] is a 83 y.o. female admitted on 07/12/2016 with weakness.   Patient was discharged on 07/09/16 on penicillin g 12 million units IV daily as continuous infusion and gentamicin 60mg  IV daily for viridans group streptococcus prosthetic valve endocarditis for 6 weeks through 08/05/16.  SCr is elevated today at 1.3  Plan: Penicillin G 6 million units IV q12h (over continuous infusion) Gentamicin 60 mg IV q24h Monitor renal function, clinical progress, gent trough at Css  Height: 5' (152.4 cm) Weight: 145 lb (65.8 kg) IBW/kg (Calculated) : 45.5  Temp (24hrs), Avg:97.8 F (36.6 C), Min:97.5 F (36.4 C), Max:98 F (36.7 C)   Recent Labs Lab 07/08/16 0525 07/09/16 0548 07/12/16 1326 07/12/16 1336 07/12/16 1337  WBC 4.3 5.9 7.6  --   --   CREATININE 1.43* 1.15* 1.20* 1.30*  --   LATICACIDVEN  --   --   --   --  0.93    Estimated Creatinine Clearance: 29.2 mL/min (by C-G formula based on SCr of 1.3 mg/dL (H)).    Allergies  Allergen Reactions  . Ceftriaxone Other (See Comments)    Per MD progress note 2/12:  Infusion reaction.  Denies other associated symptoms such as diaphoresis, nausea, vomiting, dysuria, diarrhea, lower abdominal pain, received palpitations, swelling, difficulty breathing  . Hydralazine Hcl Other (See Comments)    Headache     Antimicrobials this admission: CTX 1/28 >> 2/11 Gent 1/31 >>    *missed dose 2/11 PCN G 2/12 >>  Dose adjustments this admission: 2/8 gent tr 0.5 (SCr 1.28) per [facility] - no dose adj   Andrey Cota. Diona Foley, PharmD, BCPS Clinical Pharmacist [phone] 07/12/2016 6:33 PM

## 2016-07-12 NOTE — Progress Notes (Signed)
Patient admitted to 5M10. Patient is alert, oriented x4, rash noted on patient's back, patient's daughter this is from a previous antibiotic reaction. Patient denies pain, NIHHS 0. Patient speaks russian, rn discussed plan of care with patient using interpretor. Will continue to monitor.

## 2016-07-12 NOTE — ED Triage Notes (Signed)
Pt coming in for generalized weakness no deficits noted

## 2016-07-12 NOTE — ED Notes (Signed)
Patient transported to CT 

## 2016-07-13 ENCOUNTER — Observation Stay (HOSPITAL_COMMUNITY): Payer: Self-pay

## 2016-07-13 ENCOUNTER — Observation Stay (HOSPITAL_COMMUNITY)
Admit: 2016-07-13 | Discharge: 2016-07-13 | Disposition: A | Discharge status: Home / Self Care | Payer: Self-pay | Attending: Neurology | Admitting: Neurology

## 2016-07-13 DIAGNOSIS — G9340 Encephalopathy, unspecified: Secondary | ICD-10-CM

## 2016-07-13 LAB — CULTURE, BLOOD (ROUTINE X 2)
CULTURE: NO GROWTH
Culture: NO GROWTH

## 2016-07-13 LAB — PROTIME-INR
INR: 2.26
PROTHROMBIN TIME: 25.3 s — AB (ref 11.4–15.2)

## 2016-07-13 LAB — BLOOD CULTURE ID PANEL (REFLEXED)
ACINETOBACTER BAUMANNII: NOT DETECTED
Candida albicans: NOT DETECTED
Candida glabrata: NOT DETECTED
Candida krusei: NOT DETECTED
Candida parapsilosis: NOT DETECTED
Candida tropicalis: NOT DETECTED
ESCHERICHIA COLI: NOT DETECTED
Enterobacter cloacae complex: NOT DETECTED
Enterobacteriaceae species: NOT DETECTED
Enterococcus species: NOT DETECTED
Haemophilus influenzae: NOT DETECTED
Klebsiella oxytoca: NOT DETECTED
Klebsiella pneumoniae: NOT DETECTED
Listeria monocytogenes: NOT DETECTED
Methicillin resistance: DETECTED — AB
Neisseria meningitidis: NOT DETECTED
PROTEUS SPECIES: NOT DETECTED
PSEUDOMONAS AERUGINOSA: NOT DETECTED
SERRATIA MARCESCENS: NOT DETECTED
STAPHYLOCOCCUS AUREUS BCID: NOT DETECTED
STREPTOCOCCUS PNEUMONIAE: NOT DETECTED
Staphylococcus species: DETECTED — AB
Streptococcus agalactiae: NOT DETECTED
Streptococcus pyogenes: NOT DETECTED
Streptococcus species: NOT DETECTED

## 2016-07-13 LAB — AMMONIA: Ammonia: 12 umol/L (ref 9–35)

## 2016-07-13 LAB — URINE CULTURE: Culture: NO GROWTH

## 2016-07-13 LAB — LIPID PANEL
Cholesterol: 138 mg/dL (ref 0–200)
HDL: 31 mg/dL — ABNORMAL LOW (ref >40)
LDL CALC: 86 mg/dL (ref 0–99)
TRIGLYCERIDES: 105 mg/dL (ref <150)
Total CHOL/HDL Ratio: 4.5 RATIO
VLDL: 21 mg/dL (ref 0–40)

## 2016-07-13 LAB — TSH: TSH: 0.518 u[IU]/mL (ref 0.350–4.500)

## 2016-07-13 LAB — GLUCOSE, CAPILLARY: GLUCOSE-CAPILLARY: 109 mg/dL — AB (ref 65–99)

## 2016-07-13 MED ORDER — PENICILLIN G POTASSIUM 20000000 UNITS IJ SOLR
6.0000 10*6.[IU] | Freq: Two times a day (BID) | INTRAVENOUS | Status: DC
  Administered 2016-07-13 – 2016-07-14 (×2): 6 10*6.[IU] via INTRAVENOUS
  Filled 2016-07-13 (×3): qty 6

## 2016-07-13 MED ORDER — SODIUM CHLORIDE 0.9 % IV SOLN
500.0000 mg | Freq: Two times a day (BID) | INTRAVENOUS | Status: DC
  Administered 2016-07-13 – 2016-07-15 (×4): 500 mg via INTRAVENOUS
  Filled 2016-07-13 (×5): qty 5

## 2016-07-13 MED ORDER — VANCOMYCIN HCL IN DEXTROSE 750-5 MG/150ML-% IV SOLN
750.0000 mg | INTRAVENOUS | Status: DC
  Administered 2016-07-13: 750 mg via INTRAVENOUS
  Filled 2016-07-13 (×2): qty 150

## 2016-07-13 MED ORDER — WARFARIN 1.25 MG HALF TABLET
1.2500 mg | ORAL_TABLET | Freq: Once | ORAL | Status: AC
  Administered 2016-07-13: 1.25 mg via ORAL
  Filled 2016-07-13: qty 1

## 2016-07-13 NOTE — Progress Notes (Signed)
RN spoke with MRI technician. Patient may possibly be transported for MRI in next 1-2 hours. RN will continue to monitor patient.

## 2016-07-13 NOTE — Procedures (Signed)
EEG REPORT  Clinical History:  Abrupt change in mental status with return to baseline.  Technical Summary:  A 19 channel digital EEG recording was performed using the 10-20 international system of electrode placement.  Bipolar and referential montages were used.  Total recording time was about 20 minutes.  EEG Description:  There is a well developed and regulated posterior alpha rhythm of 8 Hz reactive to eye opening and closure.  Both hemispheres are symmetrical and there is no focal slowing present.  On 2 occasions, there are epileptiform discharges with maximal negativity at T3 confirmed on bipolar and referential montages.  No electrographic seizures are present. No sleep was recorded.  Impression:   This is an abnormal EEG in the awake state.  There is evidence of a seizure tendency emanating from the left mid-temporal lobe putting the patient at risk of focal seizures with or without secondary generalization.  Correlation with a lesion on MRI Brain is recommended.     Rogue Jury, MD

## 2016-07-13 NOTE — Progress Notes (Signed)
[facility]  Active pt with [facility] prior to this admission for [facility] services and Home IV ABX.  [facility] hospital team will follow Alexis [name] while here.  If patient discharges after hours, please call [phone].   Larry Sierras 07/13/2016, 7:37 AM

## 2016-07-13 NOTE — Progress Notes (Signed)
PROGRESS NOTE                                                                                                                                                                                                             Patient Demographics:    Alexis [name], is a 83 y.o. female, DOB - 1933-09-05, UVO:[id number]  Admit date - 07/12/2016   Admitting Physician Albertine Patricia, MD  Outpatient Primary MD for the patient is [name], MD  LOS - 0 Chief Complaint  Patient presents with  . Weakness    Pt was recently tx and discharged for endocarditis comes in today for weakness       Brief Narrative   83 y.o. female, with medical history significant of atrial fibrillation, CAD, CVA, mitral valve replacement, CABG recently admitted for strep bacteremia with mitral valve endocarditis, another admission secondary to reaction to Rocephin that was changed to ampicillin and gentamicin, was just discharged 07/09/2016, He presents with altered mental status and slurred speech, admitted for further workup  Subjective:    Alexis [name] today has, No headache, No chest pain, No abdominal pain, Daughter reports mother still slightly above baseline, as well she report mother has unsteady gait   Assessment  & Plan :    Active Problems:   Congestive heart failure (HCC)   Bacteremia due to Streptococcus   Atrial fibrillation (HCC)   Streptococcal endocarditis   Mitral valve replaced   Adjustment disorder with mixed anxiety and depressed mood   TIA (transient ischemic attack)   Encephalopathy - An episode of confusion, slurred speech, generalized weakness, and worsening visual acuity , she is afebrile, no evidence of active infection, negative urinalysis, lactic acid within normal limits, his x-ray with no evidence of acute cardiopulmonary disease. - Neurology input greatly appreciated, acute CVA versus seizures is currently on the differentials,  especially with abnormal MRI of the brain with suspicion subacute CVA, as well as abnormal EEG with seizure potentials, will await neurology recommendation.  Streptococcus viridans bacteremia with mitral valve endocarditis - Continue with IV antibiotic regimen including IV ampicillin and IV gentamicin through PICC line(antibiotic regimen recently change secondary to reaction to Rocephin)  Hypertension - Blood pressure acceptable, continue with home medication  Atrial fibrillation - CHA2DS2VASC score 6 - Continue bisoprolol for rate control. - On warfarin, pharmacy to dose  CKD stage III - At baseline, continue to monitor  Hyperlipidemia - Continue with Lipitor    Code Status : Full  Family Communication  : Discussed with daughter at bedside  Disposition Plan  : Pending further workup   Consults  :  neurology  Procedures  : none  DVT Prophylaxis  :  On warfarin  Lab Results  Component Value Date   PLT 218 07/12/2016    Antibiotics  :    Anti-infectives    Start     Dose/Rate Route Frequency Ordered Stop   07/13/16 1400  penicillin G potassium 6 Million Units in dextrose 5 % 500 mL continuous infusion     6 Million Units 41.7 mL/hr over 12 Hours Intravenous Every 12 hours 07/13/16 0805     07/13/16 0800  gentamicin (GARAMYCIN) 60 mg in dextrose 5 % 100 mL IVPB  Status:  Discontinued     60 mg 101.5 mL/hr over 60 Minutes Intravenous Every 24 hours 07/12/16 1859 07/12/16 1902   07/12/16 2000  penicillin G potassium 6 Million Units in dextrose 5 % 500 mL continuous infusion  Status:  Discontinued     6 Million Units 41.7 mL/hr over 12 Hours Intravenous Every 12 hours 07/12/16 1859 07/13/16 0805   07/12/16 2000  gentamicin (GARAMYCIN) 60 mg in dextrose 5 % 100 mL IVPB     60 mg 101.5 mL/hr over 60 Minutes Intravenous Every 24 hours 07/12/16 1902     07/12/16 1830  gentamicin (GARAMYCIN) IVPB  Status:  Discontinued    Comments:  Indication: Prosthetic valve  endocarditis Last Day of Therapy:  08/05/16 Labs - 'Sunday/Monday:  CBC/D, BMP, and gentamicin trough. Labs - Thursday:  BMP and gentamicin trough Labs - Every other week:  ESR and CRP     60 mg Intravenous Every 24 hours 07/12/16 1825 07/12/16 1859   07/12/16 1830  penicillin G potassium IVPB  Status:  Discontinued    Comments:  as continuous infusion. Indication: 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Last Day of Therapy:  08/05/16 Labs - Once weekly:  CBC/D and BMP, Labs - Every other week:  ESR and CRP     12 Million Units Intravenous Daily 07/12/16 1825 07/12/16 1859        Objective:   Vitals:   07/13/16 0203 07/13/16 0400 07/13/16 0600 07/13/16 0906  BP: 127/60 129/67 (!) 137/59 (!) 119/59  Pulse: 66 65 75 74  Resp:  18 18 20  Temp:    98.4 F (36.9 C)  TempSrc:    Oral  SpO2:  95% 98% 99%  Weight:      Height:        Wt Readings from Last 3 Encounters:  07/12/16 65.8 kg (145 lb)  07/09/16 71.4 kg (157 lb 4.8 oz)  06/28/16 69.4 kg (153 lb)     Intake/Output Summary (Last 24 hours) at 07/13/16 1132 Last data filed at 07/13/16 0623  Gross per 24 hour  Intake            184.9 ml  Output              30'$ 0 ml  Net           -115.1 ml     Physical Exam  Awake Alert, Oriented X 3,(daughter at bedside translate ) Supple Neck,No JVD,  Symmetrical Chest wall movement, Good air movement bilaterally, CTAB RRR,No Gallops,Rubs or new Murmurs, No Parasternal Heave +ve B.Sounds, Abd Soft, No tenderness, No rebound - guarding  or rigidity. No Cyanosis, Clubbing or edema, No new Rash or bruise      Data Review:    CBC  Recent Labs Lab 07/08/16 0525 07/09/16 0548 07/12/16 1326 07/12/16 1336  WBC 4.3 5.9 7.6  --   HGB 10.7* 9.7* 10.2* 10.2*  HCT 32.2* 29.5* 31.2* 30.0*  PLT 187 168 218  --   MCV 80.1 81.3 83.2  --   MCH 26.6 26.7 27.2  --   MCHC 33.2 32.9 32.7  --   RDW 14.8 15.1 15.5  --   LYMPHSABS 0.6*  --  0.7  --   MONOABS 0.4  --  0.8  --   EOSABS 0.1  --   0.1  --   BASOSABS 0.0  --  0.0  --     Chemistries   Recent Labs Lab 07/08/16 0525 07/09/16 0548 07/12/16 1326 07/12/16 1336  NA 136 130* 137 138  K 3.6 3.6 4.3 4.3  CL 102 98* 105 104  CO2 19* 23 23  --   GLUCOSE 110* 128* 101* 101*  BUN '18 13 12 13  '$ CREATININE 1.43* 1.15* 1.20* 1.30*  CALCIUM 9.2 8.5* 9.1  --   AST '30 24 23  '$ --   ALT '17 15 17  '$ --   ALKPHOS 63 60 65  --   BILITOT 0.9 0.6 0.9  --    ------------------------------------------------------------------------------------------------------------------  Recent Labs  07/13/16 0510  CHOL 138  HDL 31*  LDLCALC 86  TRIG 105  CHOLHDL 4.5    No results found for: HGBA1C ------------------------------------------------------------------------------------------------------------------  Recent Labs  07/13/16 0044  TSH 0.518   ------------------------------------------------------------------------------------------------------------------ No results for input(s): VITAMINB12, FOLATE, FERRITIN, TIBC, IRON, RETICCTPCT in the last 72 hours.  Coagulation profile  Recent Labs Lab 07/08/16 0525 07/09/16 0548 07/12/16 1744 07/13/16 0510  INR 1.71 2.08 2.13 2.26    No results for input(s): DDIMER in the last 72 hours.  Cardiac Enzymes No results for input(s): CKMB, TROPONINI, MYOGLOBIN in the last 168 hours.  Invalid input(s): CK ------------------------------------------------------------------------------------------------------------------ No results found for: BNP  Inpatient Medications  Scheduled Meds: . atorvastatin  10 mg Oral Daily  . bisoprolol  2.5 mg Oral Daily  . cholecalciferol  2,000 Units Oral Daily  . gentamicin  60 mg Intravenous Q24H  . loratadine  10 mg Oral Daily  . penicillin g continuous IV infusion  6 Million Units Intravenous Q12H  . ramipril  2.5 mg Oral Daily  . warfarin  1.25 mg Oral ONCE-1800  . Warfarin - Pharmacist Dosing Inpatient   Does not apply q1800    Continuous Infusions: PRN Meds:.acetaminophen **OR** acetaminophen (TYLENOL) oral liquid 160 mg/5 mL **OR** acetaminophen, sodium chloride flush  Micro Results Recent Results (from the past 240 hour(s))  Culture, blood (routine x 2)     Status: None   Collection Time: 07/08/16 11:40 AM  Result Value Ref Range Status   Specimen Description BLOOD LEFT ASSIST CONTROL  Final   Special Requests BOTTLES DRAWN AEROBIC AND ANAEROBIC 5CC EACH  Final   Culture NO GROWTH 5 DAYS  Final   Report Status 07/13/2016 FINAL  Final  Culture, blood (routine x 2)     Status: None   Collection Time: 07/08/16 11:55 AM  Result Value Ref Range Status   Specimen Description BLOOD LEFT HAND  Final   Special Requests BOTTLES DRAWN AEROBIC ONLY 5CC  Final   Culture NO GROWTH 5 DAYS  Final   Report Status 07/13/2016 FINAL  Final  Blood Culture (routine x 2)     Status: None (Preliminary result)   Collection Time: 07/12/16  1:29 PM  Result Value Ref Range Status   Specimen Description BLOOD LEFT ANTECUBITAL  Final   Special Requests BOTTLES DRAWN AEROBIC AND ANAEROBIC 5CC  Final   Culture NO GROWTH < 24 HOURS  Final   Report Status PENDING  Incomplete  Blood Culture (routine x 2)     Status: None (Preliminary result)   Collection Time: 07/12/16  2:15 PM  Result Value Ref Range Status   Specimen Description BLOOD BLOOD LEFT FOREARM  Final   Special Requests AEROBIC BOTTLE ONLY 5CC  Final   Culture NO GROWTH < 24 HOURS  Final   Report Status PENDING  Incomplete    Radiology Reports Dg Chest 2 View  Result Date: 07/12/2016 CLINICAL DATA:  Sepsis, generalized weakness, recent flu symptoms, history hypertension, atrial fibrillation, stroke, coronary artery disease EXAM: CHEST  2 VIEW COMPARISON:  07/08/2016 FINDINGS: RIGHT arm PICC line tip projects over SVC. Minimal enlargement of cardiac silhouette post CABG, MVR, and AVR. Atherosclerotic calcification aorta. Mild pulmonary vascular congestion. Bibasilar  atelectasis. Lungs otherwise clear. No pleural effusion or pneumothorax. Osseous demineralization. IMPRESSION: Minimal enlargement of cardiac silhouette post CABG, AVR and MVR. Aortic atherosclerosis. Bibasilar atelectasis. Electronically Signed   By: Lavonia Dana M.D.   On: 07/12/2016 13:58   Ct Head Wo Contrast  Result Date: 07/12/2016 CLINICAL DATA:  Confusion, lethargy, body weakness and slurred speech beginning today, history stroke, coronary artery disease post CABG, atrial fibrillation, hypertension EXAM: CT HEAD WITHOUT CONTRAST TECHNIQUE: Contiguous axial images were obtained from the base of the skull through the vertex without intravenous contrast. COMPARISON:  None FINDINGS: Brain: Generalized atrophy. Normal ventricular morphology. No midline shift or mass effect. Mild small vessel chronic ischemic changes of deep cerebral white matter. No intracranial hemorrhage, mass lesion or evidence acute infarction. No extra-axial fluid collections. Vascular: Extensive atherosclerotic calcification of internal carotid and vertebrobasilar arteries at skullbase. Skull: Intact Sinuses/Orbits: Clear Other: N/A IMPRESSION: Atrophy with mild small vessel chronic ischemic changes of deep cerebral white matter. No acute intracranial abnormalities. Electronically Signed   By: Lavonia Dana M.D.   On: 07/12/2016 16:20   Mr Brain Wo Contrast  Result Date: 07/13/2016 CLINICAL DATA:  Generalized weakness. Altered mental status. Recent mitral valve endocarditis. EXAM: MRI HEAD WITHOUT CONTRAST MRA HEAD WITHOUT CONTRAST TECHNIQUE: Multiplanar, multiecho pulse sequences of the brain and surrounding structures were obtained without intravenous contrast. Angiographic images of the head were obtained using MRA technique without contrast. COMPARISON:  Yesterday FINDINGS: MRI HEAD FINDINGS Brain: Hyperintense DWI signal along the lower thalamus bilaterally extending into the upper right midbrain right of the cerebral aquaduct.  These areas are isointense on ADC map. The deep gray nuclei corpus callosum have normal signal and appearance. The mamillary bodies are not well covered due to slice selection and motion artifact. Mild signal abnormality in the cerebral white matter and dorsal pons, likely chronic microvascular ischemia. Normal brain volume. No hemorrhage or hydrocephalus. Negative for mass. Vascular: Major flow voids are preserved, including the deep veins and basilar. Skull and upper cervical spine: No marrow lesion noted. Sinuses/Orbits: Left cataract resection. MRA HEAD FINDINGS Extremely motion degraded exam. Poorly visualized proximal right P1 segment is attributed to fetal type circulation based on contemporaneous T2 weighted acquisition. There is likely bilateral atherosclerotic irregularity. No proximal occlusion noted. IMPRESSION: 1. Abnormal signal in the lower thalami and right midbrain favor subacute infarct from  thalamoperforators or artery of Percheron. Would correlate for symptoms/risk factors of Wernicke's, which can also affect these structures. 2. Extremely limited intracranial MRA due to more motion artifact. No proximal occlusion noted. Electronically Signed   By: Monte Fantasia M.D.   On: 07/13/2016 11:05   Dg Chest Port 1 View  Result Date: 06/28/2016 CLINICAL DATA:  Central line placement. EXAM: PORTABLE CHEST 1 VIEW COMPARISON:  None. FINDINGS: The patient's right PICC is noted ending about the mid SVC. Mild vascular congestion is noted. Mild peribronchial thickening is seen. Minimal bilateral atelectasis is seen. No pleural effusion or pneumothorax is identified. The cardiomediastinal silhouette is mildly enlarged. The patient is status post median sternotomy. No acute osseous abnormalities are identified. IMPRESSION: 1. Right PICC noted ending about the mid SVC. 2. Mild vascular congestion and mild cardiomegaly. Peribronchial thickening seen. Minimal bilateral atelectasis noted. Electronically Signed    By: Garald Balding M.D.   On: 06/28/2016 16:48   Dg Abdomen Acute W/chest  Result Date: 07/08/2016 CLINICAL DATA:  Epigastric pain tonight. EXAM: DG ABDOMEN ACUTE W/ 1V CHEST COMPARISON:  Chest 06/28/2016 FINDINGS: Postoperative changes in the mediastinum. Right PICC line with tip over the low SVC region. Shallow inspiration. Heart size and pulmonary vascularity are normal. Linear scarring or atelectasis in the mid lungs, similar to prior study. Calcified and tortuous aorta. Prominent upper abdominal vascular calcifications. Scattered gas and stool throughout the colon. No small or large bowel distention. No free intra- abdominal air. No abnormal air-fluid levels. Calcifications in the pelvis consistent with fibroids. Vascular calcifications in the aorta and iliac arteries. Degenerative changes in the spine and hips. IMPRESSION: No evidence of active pulmonary disease. Nonobstructive bowel gas pattern. Vascular calcifications. Calcified fibroids. Electronically Signed   By: Lucienne Capers M.D.   On: 07/08/2016 06:03   Mr Jodene Nam Head/brain HY Cm  Result Date: 07/13/2016 CLINICAL DATA:  Generalized weakness. Altered mental status. Recent mitral valve endocarditis. EXAM: MRI HEAD WITHOUT CONTRAST MRA HEAD WITHOUT CONTRAST TECHNIQUE: Multiplanar, multiecho pulse sequences of the brain and surrounding structures were obtained without intravenous contrast. Angiographic images of the head were obtained using MRA technique without contrast. COMPARISON:  Yesterday FINDINGS: MRI HEAD FINDINGS Brain: Hyperintense DWI signal along the lower thalamus bilaterally extending into the upper right midbrain right of the cerebral aquaduct. These areas are isointense on ADC map. The deep gray nuclei corpus callosum have normal signal and appearance. The mamillary bodies are not well covered due to slice selection and motion artifact. Mild signal abnormality in the cerebral white matter and dorsal pons, likely chronic  microvascular ischemia. Normal brain volume. No hemorrhage or hydrocephalus. Negative for mass. Vascular: Major flow voids are preserved, including the deep veins and basilar. Skull and upper cervical spine: No marrow lesion noted. Sinuses/Orbits: Left cataract resection. MRA HEAD FINDINGS Extremely motion degraded exam. Poorly visualized proximal right P1 segment is attributed to fetal type circulation based on contemporaneous T2 weighted acquisition. There is likely bilateral atherosclerotic irregularity. No proximal occlusion noted. IMPRESSION: 1. Abnormal signal in the lower thalami and right midbrain favor subacute infarct from thalamoperforators or artery of Percheron. Would correlate for symptoms/risk factors of Wernicke's, which can also affect these structures. 2. Extremely limited intracranial MRA due to more motion artifact. No proximal occlusion noted. Electronically Signed   By: Monte Fantasia M.D.   On: 07/13/2016 11:05     Amiree No M.D on 07/13/2016 at 11:32 AM  Between 7am to 7pm - Pager - [phone]  After 7pm go  to [email or web address] - password [facility]  [facility] -  Office  [phone]

## 2016-07-13 NOTE — Evaluation (Signed)
Occupational Therapy Evaluation Patient Details Name: Alexis [name] MRN: [id number] DOB: 01/08/1934 Today's Date: 07/13/2016    History of Present Illness Patient is a 83 year old female history of hypertension, coronary artery disease status post CABG in 2004, CVA, atrial fibrillation on chronic Coumadin. Pt admitted for weakness.   Clinical Impression   Pt admitted with above. She demonstrates the below listed deficits and will benefit from continued OT to maximize safety and independence with BADLs.  Pt presents to OT with generalized weakness, visual impairment including subjective blurriness with Lt gaze and vertical nystagmus noted worse on the left with decreased suprior gaze  on Lt, and impaired cognition.  She requires mod A, overall for ADLs due to fatigue.  Daughter supportive, recommend HHOT       Follow Up Recommendations  Home health OT;Supervision/Assistance - 24 hour    Equipment Recommendations  None recommended by OT    Recommendations for Other Services       Precautions / Restrictions Precautions Precautions: None      Mobility Bed Mobility Overal bed mobility: Needs Assistance Bed Mobility: Supine to Sit;Sit to Supine     Supine to sit: Min assist Sit to supine: Supervision   General bed mobility comments: min A to initiate moving LEs out of bed   Transfers                 General transfer comment: Pt fatigued and did not perform     Balance Overall balance assessment: Needs assistance Sitting-balance support: Feet supported Sitting balance-Leahy Scale: Fair                                      ADL Overall ADL's : Needs assistance/impaired Eating/Feeding: Independent   Grooming: Wash/dry hands;Wash/dry face;Oral care;Brushing hair;Set up;Sitting   Upper Body Bathing: Minimal assistance;Sitting   Lower Body Bathing: Moderate assistance;Sitting/lateral leans   Upper Body Dressing : Minimal  assistance;Sitting   Lower Body Dressing: Maximal assistance;Bed level Lower Body Dressing Details (indicate cue type and reason): Pt too fatigued to complete    Toilet Transfer Details (indicate cue type and reason): unable to perform due to pt fatigued  Toileting- Clothing Manipulation and Hygiene: Maximal assistance;Sitting/lateral lean         General ADL Comments: Pt moved to EOB and participated in visual assessment then returned to supine stating she was fatigued      Vision Vision Assessment?: Yes Eye Alignment: Within Functional Limits Ocular Range of Motion: Within Functional Limits Tracking/Visual Pursuits: Decreased smoothness of horizontal tracking;Decreased smoothness of vertical tracking Visual Fields: No apparent deficits Additional Comments: Pt demonstrates vertical diplopia and decreased Lt superior gaze.  She reports blurriness on Lt.      Perception Perception Perception Tested?: Yes   Praxis Praxis Praxis tested?: Within functional limits    Pertinent Vitals/Pain Pain Assessment: No/denies pain     Hand Dominance Right   Extremity/Trunk Assessment Upper Extremity Assessment Upper Extremity Assessment: Overall WFL for tasks assessed   Lower Extremity Assessment Lower Extremity Assessment: Defer to PT evaluation   Cervical / Trunk Assessment Cervical / Trunk Assessment: Kyphotic   Communication Communication Communication: Interpreter utilized;Prefers language other than English (daughter interpreted )   Cognition Arousal/Alertness: Awake/alert Behavior During Therapy: WFL for tasks assessed/performed Overall Cognitive Status: Difficult to assess                 General Comments: pt  is easily distracted.   She fatigued quickly making it difficult to accurately assess cognition    General Comments       Exercises       Shoulder Instructions      Home Living Family/patient expects to be discharged to:: Private residence Living  Arrangements: Children Available Help at Discharge: Family;Available 24 hours/day Type of Home: House Home Access: Stairs to enter [employer] of Steps: 1 Entrance Stairs-Rails: None Home Layout: One level     Bathroom Shower/Tub: English as a second language teacher characteristics: Charity fundraiser: Standard     Home Equipment: Shower seat;Grab bars - tub/shower;Walker - 4 wheels   Additional Comments: patient spends winters living with daughter but in Summer months she lives in [place], [place] and lives by herself while there   Lives With: Daughter (patient spends winters living with daughter but in Summer months she lives in [place], [place] and lives by herself while there )    Prior Functioning/Environment Level of Independence: Needs assistance  Gait / Transfers Assistance Needed: supervision with RW  ADL's / Homemaking Assistance Needed: Pt's daughter reports prior to January, pt was independent.  Since January, she has required supervision with ADLs and assist with IADLs  Communication / Swallowing Assistance Needed: speaks [place]           OT Problem List: Decreased strength;Decreased activity tolerance;Impaired balance (sitting and/or standing);Impaired vision/perception;Decreased safety awareness;Decreased cognition   OT Treatment/Interventions: Self-care/ADL training;DME and/or AE instruction;Therapeutic activities;Cognitive remediation/compensation;Visual/perceptual remediation/compensation;Patient/family education;Balance training;Neuromuscular education    OT Goals(Current goals can be found in the care plan section) Acute Rehab OT Goals Patient Stated Goal: to get better OT Goal Formulation: With patient/family Time For Goal Achievement: 07/27/16 Potential to Achieve Goals: Good ADL Goals Pt Will Perform Grooming: with min guard assist;standing Pt Will Perform Upper Body Bathing: with set-up;sitting Pt Will Perform Lower Body Bathing: with  min guard assist;sit to/from stand Pt Will Perform Upper Body Dressing: with supervision;sitting Pt Will Perform Lower Body Dressing: with min guard assist;sit to/from stand Pt Will Transfer to Toilet: with min guard assist;ambulating;regular height toilet;bedside commode;grab bars Pt Will Perform Toileting - Clothing Manipulation and hygiene: with min guard assist;sit to/from stand Pt Will Perform Tub/Shower Transfer: with min guard assist;ambulating;tub bench;shower seat;rolling walker  OT Frequency: Min 2X/week   Barriers to D/C:            Co-evaluation              End of Session Nurse Communication: Mobility status  Activity Tolerance: Patient limited by fatigue Patient left: in bed;with call bell/phone within reach;with bed alarm set;with family/visitor present   Time: 7654-6503 OT Time Calculation (min): 16 min Charges:  OT General Charges $OT Visit: 1 Procedure OT Evaluation $OT Eval Moderate Complexity: 1 Procedure G-Codes: OT G-codes **NOT FOR INPATIENT CLASS** Functional Assessment Tool Used: Clinical Judgement Functional Limitation: Self care Self Care Current Status (T4656): At least 40 percent but less than 60 percent impaired, limited or restricted Self Care Goal Status (C1275): At least 20 percent but less than 40 percent impaired, limited or restricted  Alexis [name] M 07/13/2016, 2:45 PM

## 2016-07-13 NOTE — Evaluation (Signed)
Speech Language Pathology Evaluation Patient Details Name: Alexis [name] MRN: [id number] DOB: 07-11-1933 Today's Date: 07/13/2016 Time: [id number] SLP Time Calculation (min) (ACUTE ONLY): 25 min  Problem List:  Patient Active Problem List   Diagnosis Date Noted  . TIA (transient ischemic attack) 07/12/2016  . Adjustment disorder with mixed anxiety and depressed mood 07/09/2016  . Medication reaction 07/08/2016  . Streptococcal endocarditis 07/08/2016  . Mitral valve replaced 07/08/2016  . Acute on chronic renal insufficiency 07/08/2016  . Chronic atrial fibrillation ([place]) 07/08/2016  . 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([place])   . Bacteremia due to Streptococcus 06/23/2016  . Atrial fibrillation ([place])   . Positive blood cultures 06/22/2016  . Shoulder blade pain 06/06/2016  . Eczema 06/06/2016  . Chest wall pain 06/06/2016  . Gastroenteritis 06/20/2014  . Orthostatic hypotension 06/20/2014  . Hyponatremia 06/20/2014  . Memory loss 08/16/2013  . Encounter for therapeutic drug monitoring 07/06/2013  . Long term current use of anticoagulant therapy 06/17/2012  . Encounter for monitoring coumadin therapy 03/30/2012  . Essential hypertension 05/31/2008  . Coronary atherosclerosis 05/31/2008  . Congestive heart failure ([place]) 05/31/2008  . CONSTIPATION 05/31/2008  . Myalgia 05/31/2008   Past Medical History:  Past Medical History:  Diagnosis Date  . Anxiety   . Atrial fibrillation ([place])   . CAD (coronary artery disease) of artery bypass graft   . CVA (cerebral vascular accident) ([place]) 05/2000   "memory issues, dizziness since" (07/08/2016  . Depression   . Dizzinesses    "often since [facility] 2009" (07/08/2016)  . Endocarditis dx'd 05/2016  . Foot fracture, left    "no OR; from fall"  . GERD (gastroesophageal reflux disease)    hx  . High cholesterol   . History of blood transfusion ~ 1960  . Hypertension   . Pneumonia 1-2 X  . Polyarthritis rheumatica (H[facility])    Past Surgical  History:  Past Surgical History:  Procedure Laterality Date  . AORTIC VALVE REPLACEMENT  03/2008  . CARDIAC VALVE REPLACEMENT    . CATARACT EXTRACTION W/ INTRAOCULAR LENS IMPLANT Left   . CORONARY ANGIOPLASTY  2009  . CORONARY ARTERY BYPASS GRAFT  03/2008  . MITRAL VALVE REPAIR ([facility])/CORONARY ARTERY BYPASS GRAFTING (CABG)  03/2008   2009 [facility]  . TEE WITHOUT CARDIOVERSION N/A 06/26/2016   Procedure: TRANSESOPHAGEAL ECHOCARDIOGRAM (TEE);  Surgeon: Josue Hector, MD;  Location: [facility] ENDOSCOPY;  Service: Cardiovascular;  Laterality: N/A;   HPI:  Patient is an 83 y.o. female with PMH: Atrial fibrillation, CAD, CVA, mitral valve replacement, CABG, recently admitted with strep bacteremia with mitral valve endocarditis and discharged on 2/13. She was admitted on 2/16 with AMS, slurred speech.  MD has not yet determined CVA versus seizures.   Assessment / Plan / Recommendation Clinical Impression  Patient presents with a mild flaccid dysarthria, which per daughter, has fluctuated some but is better than yesterday. Patient also exhibits a mild cognitive impairment, although difficult to determine if this is related to just being fatigued and may be a transient impairment. She was oriented but did seem to need extra time to process when answering questions related to memory of recent events, etc. Per patient's daughter, she has had fluctuations in her cogniton as well and did have some possible hallucinations when patient asked daughter, "Where did the patient go?", thinking there was someone else in the room. SLP discussed with daughter need to monitor her for safety with ADL's when home with daughter, and to supervise her especially until she is  able to perform safely with more independence.     SLP Assessment  Patient does not need any further Speech Lanaguage Pathology Services    Follow Up Recommendations  None    Frequency and Duration   N/A        SLP Evaluation Cognition   Overall Cognitive Status: Impaired/Different from baseline Orientation Level: Oriented X4 Attention: Selective Selective Attention: Appears intact Memory: Impaired Memory Impairment: Retrieval deficit;Decreased short term memory Decreased Short Term Memory: Verbal complex Awareness: Appears intact Problem Solving: Appears intact       Comprehension  Auditory Comprehension Overall Auditory Comprehension: Appears within functional limits for tasks assessed    Expression Expression Primary Mode of Expression: Verbal Verbal Expression Overall Verbal Expression: Appears within functional limits for tasks assessed   Oral / Motor  Oral Motor/Sensory Function Overall Oral Motor/Sensory Function: Mild impairment Facial ROM: Within Functional Limits Facial Symmetry: Abnormal symmetry left Facial Strength: Within Functional Limits Facial Sensation: Within Functional Limits Lingual ROM: Other (Comment) (reduced up and down movement) Lingual Strength: Reduced [place]um: Within Functional Limits Motor Speech Overall Motor Speech: Impaired Respiration: Within functional limits Resonance: Within functional limits Articulation: Impaired Level of Impairment: Sentence Intelligibility: Intelligibility reduced (intelligibilty reduced as per daughter report as patient speaks [place]. SLP did not observe significant dysarthria) Word: 75-100% accurate Phrase: 75-100% accurate Sentence: 75-100% accurate Conversation: Not tested Motor Planning: Witnin functional limits Motor Speech Errors: Not applicable   GO          Functional Limitations: Memory Motor Speech Current Status [phone]): At least 20 percent but less than 40 percent impaired, limited or restricted Motor Speech Goal Status [phone]): At least 1 percent but less than 20 percent impaired, limited or restricted Motor Speech Goal Status [phone]): At least 1 percent but less than 20 percent impaired, limited or restricted Memory Current Status  (I3474): At least 1 percent but less than 20 percent impaired, limited or restricted Memory Goal Status (Q5956): At least 1 percent but less than 20 percent impaired, limited or restricted Memory Discharge Status [phone]): At least 1 percent but less than 20 percent impaired, limited or restricted          Dannial Monarch, MA, CCC-SLP

## 2016-07-13 NOTE — Progress Notes (Signed)
PT Cancellation Note  Patient Details Name: Alexis [name] MRN: [id number] DOB: 1933-06-11   Cancelled Treatment:    Reason Eval/Treat Not Completed: Patient at procedure or test/unavailable. Pt being transported to MRI. PT to re-attempt as time allows.   Lorriane Shire 07/13/2016, 9:11 AM

## 2016-07-13 NOTE — Progress Notes (Signed)
EEG completed; results pending.    

## 2016-07-13 NOTE — Progress Notes (Signed)
Subjective: Interval History:  Daughter says she is repeating herself a lot, mildly confused, visual distortions, and ?visual hallucinations.  Objective: Vital signs in last 24 hours: Temp:  [97.5 F (36.4 C)-98.4 F (36.9 C)] 98.4 F (36.9 C) (02/16 2200) Pulse Rate:  [65-84] 75 (02/17 0600) Resp:  [16-29] 18 (02/17 0600) BP: (117-170)/(59-90) 137/59 (02/17 0600) SpO2:  [95 %-100 %] 98 % (02/17 0600) Weight:  [65.8 kg (145 lb)] 65.8 kg (145 lb) (02/16 1248)  Intake/Output from previous day: 02/16 0701 - 02/17 0700 In: 184.9 [IV Piggyback:184.9] Out: 300 [Urine:300] Intake/Output this shift: No intake/output data recorded. Nutritional status: Diet Heart Room service appropriate? Yes; Fluid consistency: Thin  Neurologic Exam:  Awake, alert.  No English. PERL, EOMI, face symmetric, tongue midline. Strength 5/5 UE and LE. Coord- FTN is intact.  Sensory- normal. No babinski.  No hoffman's.  Lab Results:  Recent Labs  07/12/16 1326 07/12/16 1336  WBC 7.6  --   HGB 10.2* 10.2*  HCT 31.2* 30.0*  PLT 218  --   NA 137 138  K 4.3 4.3  CL 105 104  CO2 23  --   GLUCOSE 101* 101*  BUN 12 13  CREATININE 1.20* 1.30*  CALCIUM 9.1  --    Lipid Panel  Recent Labs  07/13/16 0510  CHOL 138  TRIG 105  HDL 31*  CHOLHDL 4.5  VLDL 21  LDLCALC 86    Studies/Results: Dg Chest 2 View  Result Date: 07/12/2016 CLINICAL DATA:  Sepsis, generalized weakness, recent flu symptoms, history hypertension, atrial fibrillation, stroke, coronary artery disease EXAM: CHEST  2 VIEW COMPARISON:  07/08/2016 FINDINGS: RIGHT arm PICC line tip projects over SVC. Minimal enlargement of cardiac silhouette post CABG, MVR, and AVR. Atherosclerotic calcification aorta. Mild pulmonary vascular congestion. Bibasilar atelectasis. Lungs otherwise clear. No pleural effusion or pneumothorax. Osseous demineralization. IMPRESSION: Minimal enlargement of cardiac silhouette post CABG, AVR and MVR. Aortic  atherosclerosis. Bibasilar atelectasis. Electronically Signed   By: Lavonia Dana M.D.   On: 07/12/2016 13:58   Ct Head Wo Contrast  Result Date: 07/12/2016 CLINICAL DATA:  Confusion, lethargy, body weakness and slurred speech beginning today, history stroke, coronary artery disease post CABG, atrial fibrillation, hypertension EXAM: CT HEAD WITHOUT CONTRAST TECHNIQUE: Contiguous axial images were obtained from the base of the skull through the vertex without intravenous contrast. COMPARISON:  None FINDINGS: Brain: Generalized atrophy. Normal ventricular morphology. No midline shift or mass effect. Mild small vessel chronic ischemic changes of deep cerebral white matter. No intracranial hemorrhage, mass lesion or evidence acute infarction. No extra-axial fluid collections. Vascular: Extensive atherosclerotic calcification of internal carotid and vertebrobasilar arteries at skullbase. Skull: Intact Sinuses/Orbits: Clear Other: N/A IMPRESSION: Atrophy with mild small vessel chronic ischemic changes of deep cerebral white matter. No acute intracranial abnormalities. Electronically Signed   By: Lavonia Dana M.D.   On: 07/12/2016 16:20    Medications:  Scheduled: . atorvastatin  10 mg Oral Daily  . bisoprolol  2.5 mg Oral Daily  . cholecalciferol  2,000 Units Oral Daily  . gentamicin  60 mg Intravenous Q24H  . loratadine  10 mg Oral Daily  . penicillin g continuous IV infusion  6 Million Units Intravenous Q12H  . ramipril  2.5 mg Oral Daily  . Warfarin - Pharmacist Dosing Inpatient   Does not apply q1800    Assessment/Plan:  Recent streptococcus viridans mitral valve endocarditis presenting with MS changes.  Septic embolism is a concern, although her neurological is non-focal.  Awaiting MRI/MRA Brain, EEG, and further blood culture repeats.     LOS: 0 days   Rogue Jury, MD 07/13/2016  9:05 AM

## 2016-07-13 NOTE — Progress Notes (Signed)
[place] for warfarin Indication: atrial fibrillation  Allergies  Allergen Reactions  . Ceftriaxone Other (See Comments)    Per MD progress note 2/12:  Infusion reaction.  Denies other associated symptoms such as diaphoresis, nausea, vomiting, dysuria, diarrhea, lower abdominal pain, received palpitations, swelling, difficulty breathing  . Hydralazine Hcl Other (See Comments)    Headache     Patient Measurements: Height: 5' (152.4 cm) Weight: 145 lb (65.8 kg) IBW/kg (Calculated) : 45.5  Vital Signs: Temp: 98.4 F (36.9 C) (02/17 0906) Temp Source: Oral (02/17 0906) BP: 119/59 (02/17 0906) Pulse Rate: 74 (02/17 0906)  Labs:  Recent Labs  07/12/16 1326 07/12/16 1336 07/12/16 1744 07/13/16 0510  HGB 10.2* 10.2*  --   --   HCT 31.2* 30.0*  --   --   PLT 218  --   --   --   LABPROT  --   --  24.2* 25.3*  INR  --   --  2.13 2.26  CREATININE 1.20* 1.30*  --   --     Estimated Creatinine Clearance: 29.2 mL/min (by C-G formula based on SCr of 1.3 mg/dL (H)).     Assessment: 33 yoF on warfarin PTA for atrial fibrillation/mitral valve repair presents with somnolence and difficult arousing. Pt is to resume warfarin per pharmacy. INR on admit therapeutic at 2.13  INR today = 2.26  PTA Warfarin Dose = 2.5mg  Mon/Wed/Fri, 1.25mg  Tues/Thurs/Sat/Sun  Goal of Therapy:  INR 2-3 Monitor platelets by anticoagulation protocol: Yes   Plan:  -Warfarin 1.25mg  PO x1 tonight -Monitor INR, S/Sx bleeding daily  Thank you Anette Guarneri, PharmD [phone] 07/13/2016

## 2016-07-13 NOTE — Progress Notes (Signed)
OT Cancellation Note  Patient Details Name: Alexis [name] MRN: [id number] DOB: 1933/10/15   Cancelled Treatment:    Reason Eval/Treat Not Completed: Patient at procedure or test/ unavailable.  Pt with EEG then MRI.  Will reattempt.  [place], OTR/L [phone]   Lucille Passy M 07/13/2016, 10:25 AM

## 2016-07-13 NOTE — Progress Notes (Signed)
Patients q 2 hour neuro checks and vital signs complete. Patient verbalizes "not want to go home, wants to stay here." Patients daughter states patient is in better spirits, less depressed since prior hospitalization. RN provided reassurance to patients family concerning different stages of life and changes in patients needs concerning physical care. RN will continue to monitor.

## 2016-07-13 NOTE — Progress Notes (Signed)
PHARMACY - PHYSICIAN COMMUNICATION CRITICAL VALUE ALERT - BLOOD CULTURE IDENTIFICATION (BCID)  Results for orders placed or performed during the hospital encounter of 07/12/16  Blood Culture ID Panel (Reflexed) (Collected: 07/12/2016  1:29 PM)  Result Value Ref Range   Enterococcus species NOT DETECTED NOT DETECTED   Listeria monocytogenes NOT DETECTED NOT DETECTED   Staphylococcus species DETECTED (A) NOT DETECTED   Staphylococcus aureus NOT DETECTED NOT DETECTED   Methicillin resistance DETECTED (A) NOT DETECTED   Streptococcus species NOT DETECTED NOT DETECTED   Streptococcus agalactiae NOT DETECTED NOT DETECTED   Streptococcus pneumoniae NOT DETECTED NOT DETECTED   Streptococcus pyogenes NOT DETECTED NOT DETECTED   Acinetobacter baumannii NOT DETECTED NOT DETECTED   Enterobacteriaceae species NOT DETECTED NOT DETECTED   Enterobacter cloacae complex NOT DETECTED NOT DETECTED   Escherichia coli NOT DETECTED NOT DETECTED   Klebsiella oxytoca NOT DETECTED NOT DETECTED   Klebsiella pneumoniae NOT DETECTED NOT DETECTED   Proteus species NOT DETECTED NOT DETECTED   Serratia marcescens NOT DETECTED NOT DETECTED   Haemophilus influenzae NOT DETECTED NOT DETECTED   Neisseria meningitidis NOT DETECTED NOT DETECTED   Pseudomonas aeruginosa NOT DETECTED NOT DETECTED   Candida albicans NOT DETECTED NOT DETECTED   Candida glabrata NOT DETECTED NOT DETECTED   Candida krusei NOT DETECTED NOT DETECTED   Candida parapsilosis NOT DETECTED NOT DETECTED   Candida tropicalis NOT DETECTED NOT DETECTED    Name of physician (or Provider) Contacted: Dr. Maudie Mercury   Changes to prescribed antibiotics required: Patient is currently on penicillin and gentamicin for strep viridans bacteremia with mitral valve endocarditis. Patient is readmitted with altered mental status and concern for septic embolism. May potentially be contaminant but will start vancomycin tonight to cover for this pathogen, consider  re-evaluation in morning. Her current SCr is 1.30 (baseline ~ 1.0)  Plan:  - Vancomycin 750 mg IV q24h - Continue penicillin G and gentamicin - Monitor culture and sensitivities and follow up duration of vancomycin  Dimitri Ped, PharmD, [place] PGY-2 Infectious Diseases Pharmacy Resident Pager: [phone] 07/13/2016  7:20 PM

## 2016-07-13 NOTE — Progress Notes (Signed)
VASCULAR LAB PRELIMINARY  PRELIMINARY  PRELIMINARY  PRELIMINARY  Carotid duplex completed.    Preliminary report:  1-39% ICA plaquing.  Vertebral artery flow is antegrade.   Robbyn Hodkinson, RVT 07/13/2016, 11:25 AM

## 2016-07-14 ENCOUNTER — Inpatient Hospital Stay (HOSPITAL_COMMUNITY): Payer: Self-pay

## 2016-07-14 LAB — BASIC METABOLIC PANEL
ANION GAP: 10 (ref 5–15)
BUN: 13 mg/dL (ref 6–20)
CHLORIDE: 100 mmol/L — AB (ref 101–111)
CO2: 23 mmol/L (ref 22–32)
Calcium: 8.7 mg/dL — ABNORMAL LOW (ref 8.9–10.3)
Creatinine, Ser: 1.16 mg/dL — ABNORMAL HIGH (ref 0.44–1.00)
GFR calc Af Amer: 50 mL/min — ABNORMAL LOW (ref >60)
GFR, EST NON AFRICAN AMERICAN: 43 mL/min — AB (ref >60)
Glucose, Bld: 109 mg/dL — ABNORMAL HIGH (ref 65–99)
POTASSIUM: 4 mmol/L (ref 3.5–5.1)
SODIUM: 133 mmol/L — AB (ref 135–145)

## 2016-07-14 LAB — CBC
HCT: 30.2 % — ABNORMAL LOW (ref 36.0–46.0)
HEMOGLOBIN: 9.8 g/dL — AB (ref 12.0–15.0)
MCH: 26.8 pg (ref 26.0–34.0)
MCHC: 32.5 g/dL (ref 30.0–36.0)
MCV: 82.7 fL (ref 78.0–100.0)
PLATELETS: 235 10*3/uL (ref 150–400)
RBC: 3.65 MIL/uL — AB (ref 3.87–5.11)
RDW: 15.1 % (ref 11.5–15.5)
WBC: 7 10*3/uL (ref 4.0–10.5)

## 2016-07-14 LAB — PROTIME-INR
INR: 2.28
PROTHROMBIN TIME: 25.5 s — AB (ref 11.4–15.2)

## 2016-07-14 LAB — HEMOGLOBIN A1C
Hgb A1c MFr Bld: 5.8 % — ABNORMAL HIGH (ref 4.8–5.6)
Mean Plasma Glucose: 120 mg/dL

## 2016-07-14 LAB — VITAMIN B12: Vitamin B-12: 198 pg/mL (ref 180–914)

## 2016-07-14 MED ORDER — HALOPERIDOL LACTATE 5 MG/ML IJ SOLN
INTRAMUSCULAR | Status: AC
  Filled 2016-07-14: qty 1

## 2016-07-14 MED ORDER — LORAZEPAM 2 MG/ML IJ SOLN
1.0000 mg | Freq: Once | INTRAMUSCULAR | Status: AC
  Administered 2016-07-14: 1 mg via INTRAVENOUS
  Filled 2016-07-14: qty 1

## 2016-07-14 MED ORDER — PENICILLIN G POTASSIUM 20000000 UNITS IJ SOLR
6.0000 10*6.[IU] | Freq: Two times a day (BID) | INTRAVENOUS | Status: DC
  Filled 2016-07-14: qty 6

## 2016-07-14 MED ORDER — STROKE: EARLY STAGES OF RECOVERY BOOK
Freq: Once | Status: DC
  Administered 2016-07-15: 22:00:00

## 2016-07-14 MED ORDER — HALOPERIDOL LACTATE 5 MG/ML IJ SOLN
2.0000 mg | Freq: Once | INTRAMUSCULAR | Status: AC
  Administered 2016-07-14: 2 mg via INTRAVENOUS

## 2016-07-14 MED ORDER — DAPTOMYCIN 500 MG IV SOLR
500.0000 mg | INTRAVENOUS | Status: DC
  Administered 2016-07-14 – 2016-07-17 (×4): 500 mg via INTRAVENOUS
  Filled 2016-07-14 (×5): qty 10

## 2016-07-14 MED ORDER — WARFARIN 1.25 MG HALF TABLET
1.2500 mg | ORAL_TABLET | Freq: Once | ORAL | Status: AC
  Administered 2016-07-14: 1.25 mg via ORAL
  Filled 2016-07-14: qty 1

## 2016-07-14 NOTE — Progress Notes (Addendum)
Pharmacy Antibiotic Note  Alexis [name] is a 83 y.o. female admitted on 07/12/2016 with endocarditis.  Pharmacy has been consulted for Daptomycin dosing.  Previously on Vancomycin, gentamicin, and continuous PCN, now switching to Daptomycin CrCl = 32 ml/ min  Plan: Daptomycin 500 mg iv Q 24 hours for now Gentamicin 60 mg iv Q 24 hours (as PTA, synergy) DC atorvastatin while on daptomycin CK in am and weekly  Height: 5' (152.4 cm) Weight: 145 lb (65.8 kg) IBW/kg (Calculated) : 45.5  Temp (24hrs), Avg:98.3 F (36.8 C), Min:97.9 F (36.6 C), Max:98.6 F (37 C)   Recent Labs Lab 07/08/16 0525 07/09/16 0548 07/12/16 1326 07/12/16 1336 07/12/16 1337 07/14/16 0439  WBC 4.3 5.9 7.6  --   --  7.0  CREATININE 1.43* 1.15* 1.20* 1.30*  --  1.16*  LATICACIDVEN  --   --   --   --  0.93  --     Estimated Creatinine Clearance: 32.7 mL/min (by C-G formula based on SCr of 1.16 mg/dL (H)).    Allergies  Allergen Reactions  . Ceftriaxone Other (See Comments)    Per MD progress note 2/12:  Infusion reaction.  Denies other associated symptoms such as diaphoresis, nausea, vomiting, dysuria, diarrhea, lower abdominal pain, received palpitations, swelling, difficulty breathing  . Hydralazine Hcl Other (See Comments)    Headache     Thank you Anette Guarneri, PharmD [phone] 07/14/2016 1:38 PM

## 2016-07-14 NOTE — Progress Notes (Signed)
Patient's daughter pressed staff emergency button. RN came to room, patient laying in bed, patient's daughter tearful, stating the patient had severe pain like her previous reaction on Monday. Using interpretor, patient states she had a brief episode of severe pain in her stomach, felt her veins were pulsating through her body. Patient states she is very nervous. VSS, neuro status unchanged, abdomen within defined limits. MD notified and aware.   Patient refused ativan ordered by MD for anxiety, patient states she feels calmer now.   MD states he will come to bedside to see patient.

## 2016-07-14 NOTE — Progress Notes (Signed)
Patient's penicillin was stopped for 2 hours today while patient was in MRI. Pharmacy was notified.

## 2016-07-14 NOTE — Progress Notes (Addendum)
PROGRESS NOTE                                                                                                                                                                                                             Patient Demographics:    Alexis [name], is a 83 y.o. female, DOB - [birth year], PVV:[id number]  Admit date - 07/12/2016   Admitting Physician Albertine Patricia, MD  Outpatient Primary MD for the patient is [name], MD  LOS - 1 Chief Complaint  Patient presents with  . Weakness    Pt was recently tx and discharged for endocarditis comes in today for weakness       Brief Narrative   83 y.o. female, with medical history significant of atrial fibrillation, CAD, CVA, mitral valve replacement, CABG recently admitted for strep bacteremia with mitral valve endocarditis, another admission secondary to reaction to Rocephin that was changed to ampicillin and gentamicin, was just discharged 07/09/2016, He presents with altered mental status and slurred speech, admitted for further workup, EEG was significant for seizures, MRI/MRA brain was poor quality, suspicious subacute stroke.  Subjective:    Alexis [name] today has, No headache, No chest pain, No abdominal pain, had  episodes of significant anxiety earlier today   Assessment  & Plan :    Active Problems:   Congestive heart failure (HCC)   Bacteremia due to Streptococcus   Atrial fibrillation (HCC)   Streptococcal endocarditis   Mitral valve replaced   Adjustment disorder with mixed anxiety and depressed mood   TIA (transient ischemic attack)   Acute Encephalopathy - An episode of confusion, slurred speech, generalized weakness, and worsening visual acuity , she is afebrile, no evidence of active infection, negative urinalysis, lactic acid within normal limits, his x-ray with no evidence of acute cardiopulmonary disease. - Neurology input greatly appreciated, acute CVA  versus seizures is currently on the differentials, especially with abnormal MRI of the brain with suspicion subacute CVA, as well as abnormal EEG with seizure potentials, she is started on Keppra, has been tolerating well, discussed with neurology, MRI with significant motion artifact, will repeat today.  Streptococcus viridans bacteremia with mitral valve endocarditis - She is treated with IV antibiotic regimen including IV ampicillin and IV gentamicin through PICC line(antibiotic regimen recently change secondary to reaction to Rocephin) - Given new onset  seizures, possibly related to beta-lactam antibiotics, discussed with ID Dr Tommy Medal regarding antibiotic regimen, stable to be either vancomycin and gentamicin or daptomycin and gentamicin, given fear of significant nephrotoxicity from vancomycin and gentamicin, will change with daptomycin and gentamicin to finish her total 6 weeks course.  Hypertension - Blood pressure acceptable, continue with home medication  Atrial fibrillation - CHA2DS2VASC score 6 - Continue bisoprolol for rate control. - On warfarin, pharmacy to dose  CKD stage III - At baseline, continue to monitor  Hyperlipidemia - Continue with Lipitor    Code Status : Full  Family Communication  : Discussed with daughter at bedside  Disposition Plan  : Pending further workup   Consults  :  neurology  Procedures  : none  DVT Prophylaxis  :  On warfarin  Lab Results  Component Value Date   PLT 235 07/14/2016    Antibiotics  :    Anti-infectives    Start     Dose/Rate Route Frequency Ordered Stop   07/14/16 1800  penicillin G potassium 6 Million Units in dextrose 5 % 500 mL continuous infusion     6 Million Units 41.7 mL/hr over 12 Hours Intravenous Every 12 hours 07/14/16 1304     07/13/16 2000  vancomycin (VANCOCIN) IVPB 750 mg/150 ml premix     750 mg 150 mL/hr over 60 Minutes Intravenous Every 24 hours 07/13/16 1931     07/13/16 1400  penicillin  G potassium 6 Million Units in dextrose 5 % 500 mL continuous infusion  Status:  Discontinued     6 Million Units 41.7 mL/hr over 12 Hours Intravenous Every 12 hours 07/13/16 0805 07/14/16 1304   07/13/16 0800  gentamicin (GARAMYCIN) 60 mg in dextrose 5 % 100 mL IVPB  Status:  Discontinued     60 mg 101.5 mL/hr over 60 Minutes Intravenous Every 24 hours 07/12/16 1859 07/12/16 1902   07/12/16 2000  penicillin G potassium 6 Million Units in dextrose 5 % 500 mL continuous infusion  Status:  Discontinued     6 Million Units 41.7 mL/hr over 12 Hours Intravenous Every 12 hours 07/12/16 1859 07/13/16 0805   07/12/16 2000  gentamicin (GARAMYCIN) 60 mg in dextrose 5 % 100 mL IVPB     60 mg 101.5 mL/hr over 60 Minutes Intravenous Every 24 hours 07/12/16 1902     07/12/16 1830  gentamicin (GARAMYCIN) IVPB  Status:  Discontinued    Comments:  Indication: 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Last Day of Therapy:  08/05/16 Labs - _0  Million Units Intravenous Daily 07/12/16 1825 07/12/16 1859        Objective:   Vitals:   07/14/16 0138 07/14/16 0537 07/14/16 0824 07/14/16 0953  BP: (!) 142/71 138/75 (!) 141/67 (!) 115/49  Pulse: 84 68 84   Resp: _1 Temp: 98.4 F (36.9 C) 98.2 F (36.8 C)  97.9 F (36.6 C)  TempSrc: Oral Oral  Oral  SpO2: 97% 98%  99% 98%  Weight:      Height:        Wt Readings from Last 3 Encounters:  07/12/16 65.8 kg (145 lb)  07/09/16 71.4 kg (157 lb 4.8 oz)  06/28/16 69.4 kg (153 lb)     Intake/Output Summary  (Last 24 hours) at 07/14/16 1314 Last data filed at 07/14/16 0100  Gross per 24 hour  Intake            763.5 ml  Output                0 ml  Net            763.5 ml     Physical Exam  Awake Alert, Oriented X 3,(daughter at bedside translate ) Supple Neck,No JVD,  Symmetrical Chest wall movement, Good air movement bilaterally, CTAB RRR,No Gallops,Rubs or new Murmurs, No Parasternal Heave +ve B.Sounds, Abd Soft, No tenderness, No rebound - guarding or rigidity. No Cyanosis, Clubbing or edema, No new Rash or bruise      Data Review:    CBC  Recent Labs Lab 07/08/16 0525 07/09/16 0548 07/12/16 1326 07/12/16 1336 07/14/16 0439  WBC 4.3 5.9 7.6  --  7.0  HGB 10.7* 9.7* 10.2* 10.2* 9.8*  HCT 32.2* 29.5* 31.2* 30.0* 30.2*  PLT 187 168 218  --  235  MCV 80.1 81.3 83.2  --  82.7  MCH 26.6 26.7 27.2  --  26.8  MCHC 33.2 32.9 32.7  --  32.5  RDW 14.8 15.1 15.5  --  15.1  LYMPHSABS 0.6*  --  0.7  --   --   MONOABS 0.4  --  0.8  --   --   EOSABS 0.1  --  0.1  --   --   BASOSABS 0.0  --  0.0  --   --     Chemistries   Recent Labs Lab 07/08/16 0525 07/09/16 0548 07/12/16 1326 07/12/16 1336 07/14/16 0439  NA 136 130* 137 138 133*  K 3.6 3.6 4.3 4.3 4.0  CL 102 98* 105 104 100*  CO2 19* 23 23  --  23  GLUCOSE 110* 128* 101* 101* 109*  BUN _0 CREATININE 1.43* 1.15* 1.20* 1.30* 1.16*  CALCIUM 9.2 8.5* 9.1  --  8.7*  AST _1 --   --   ALT _2 --   --   ALKPHOS 63 60 65  --   --   BILITOT 0.9 0.6 0.9  --   --    ------------------------------------------------------------------------------------------------------------------  Recent Labs  07/13/16 0510  CHOL 138  HDL 31*  LDLCALC 86  TRIG 105  CHOLHDL 4.5    Lab Results  Component Value Date   HGBA1C 5.8 (H) 07/13/2016   ------------------------------------------------------------------------------------------------------------------  Recent Labs  07/13/16 0044  TSH 0.518    ------------------------------------------------------------------------------------------------------------------ No results for input(s): VITAMINB12, FOLATE, FERRITIN, TIBC, IRON, RETICCTPCT in the last 72 hours.  Coagulation profile  Recent Labs Lab 07/08/16 0525 07/09/16 0548 07/12/16 1744 07/13/16 0510 07/14/16 0439  INR 1.71 2.08 2.13 2.26 2.28    No results for input(s): DDIMER in the last 72 hours.  Cardiac Enzymes No results for input(s): CKMB, TROPONINI, MYOGLOBIN in the last 168 hours.  Invalid input(s): CK ------------------------------------------------------------------------------------------------------------------ No results found for: BNP  Inpatient Medications  Scheduled Meds: .  stroke: mapping our early stages of recovery book   Does not apply Once  . atorvastatin  10 mg Oral Daily  . bisoprolol  2.5 mg Oral Daily  . cholecalciferol  2,000 Units Oral Daily  . gentamicin  60 mg Intravenous Q24H  . haloperidol lactate      . levETIRAcetam  500 mg Intravenous Q12H  . loratadine  10 mg Oral Daily  . penicillin g continuous IV infusion  6 Million Units Intravenous Q12H  . ramipril  2.5 mg Oral Daily  . vancomycin  750 mg Intravenous Q24H  . warfarin  1.25 mg Oral ONCE-1800  . Warfarin - Pharmacist Dosing Inpatient   Does not apply q1800   Continuous Infusions: PRN Meds:.acetaminophen **OR** acetaminophen (TYLENOL) oral liquid 160 mg/5 mL **OR** acetaminophen, sodium chloride flush  Micro Results Recent Results (from the past 240 hour(s))  Culture, blood (routine x 2)     Status: None   Collection Time: 07/08/16 11:40 AM  Result Value Ref Range Status   Specimen Description BLOOD LEFT ASSIST CONTROL  Final   Special Requests BOTTLES DRAWN AEROBIC AND ANAEROBIC 5CC EACH  Final   Culture NO GROWTH 5 DAYS  Final   Report Status 07/13/2016 FINAL  Final  Culture, blood (routine x 2)     Status: None   Collection Time: 07/08/16 11:55 AM  Result  Value Ref Range Status   Specimen Description BLOOD LEFT HAND  Final   Special Requests BOTTLES DRAWN AEROBIC ONLY 5CC  Final   Culture NO GROWTH 5 DAYS  Final   Report Status 07/13/2016 FINAL  Final  Blood Culture (routine x 2)     Status: None (Preliminary result)   Collection Time: 07/12/16  1:29 PM  Result Value Ref Range Status   Specimen Description BLOOD LEFT ANTECUBITAL  Final   Special Requests BOTTLES DRAWN AEROBIC AND ANAEROBIC 5CC  Final   Culture  Setup Time   Final    GRAM POSITIVE COCCI IN CLUSTERS ANAEROBIC BOTTLE ONLY CRITICAL RESULT CALLED TO, READ BACK BY AND VERIFIED WITH: T STONE,PHARMD AT 1920 07/13/16 BY L BENFIELD    Culture   Final    GRAM POSITIVE COCCI CULTURE REINCUBATED FOR BETTER GROWTH    Report Status PENDING  Incomplete  Blood Culture ID Panel (Reflexed)     Status: Abnormal   Collection Time: 07/12/16  1:29 PM  Result Value Ref Range Status   Enterococcus species NOT DETECTED NOT DETECTED Final   Listeria monocytogenes NOT DETECTED NOT DETECTED Final   Staphylococcus species DETECTED (A) NOT DETECTED Final    Comment: Methicillin (oxacillin) resistant coagulase negative staphylococcus. Possible blood culture contaminant (unless isolated from more than one blood culture draw or clinical case suggests pathogenicity). No antibiotic treatment is indicated for blood  culture contaminants. CRITICAL RESULT CALLED TO, READ BACK BY AND VERIFIED WITH: T STONE,PHARMD AT 1920 07/13/16 BY L BENFIELD    Staphylococcus aureus NOT DETECTED NOT DETECTED Final   Methicillin resistance DETECTED (A) NOT DETECTED Final    Comment: CRITICAL RESULT CALLED TO, READ BACK BY AND VERIFIED WITH: T STONE,PHARMD AT 1920 07/13/16 BY L BENFIELD    Streptococcus species NOT DETECTED NOT DETECTED Final   Streptococcus agalactiae NOT DETECTED NOT DETECTED Final   Streptococcus pneumoniae NOT DETECTED NOT DETECTED Final   Streptococcus pyogenes NOT DETECTED NOT DETECTED Final    Acinetobacter baumannii NOT DETECTED NOT DETECTED Final   Enterobacteriaceae species NOT DETECTED NOT DETECTED Final   Enterobacter cloacae complex NOT DETECTED NOT DETECTED Final   Escherichia coli NOT DETECTED NOT DETECTED Final   Klebsiella  oxytoca NOT DETECTED NOT DETECTED Final   Klebsiella pneumoniae NOT DETECTED NOT DETECTED Final   Proteus species NOT DETECTED NOT DETECTED Final   Serratia marcescens NOT DETECTED NOT DETECTED Final   Haemophilus influenzae NOT DETECTED NOT DETECTED Final   Neisseria meningitidis NOT DETECTED NOT DETECTED Final   Pseudomonas aeruginosa NOT DETECTED NOT DETECTED Final   Candida albicans NOT DETECTED NOT DETECTED Final   Candida glabrata NOT DETECTED NOT DETECTED Final   Candida krusei NOT DETECTED NOT DETECTED Final   Candida parapsilosis NOT DETECTED NOT DETECTED Final   Candida tropicalis NOT DETECTED NOT DETECTED Final  Blood Culture (routine x 2)     Status: None (Preliminary result)   Collection Time: 07/12/16  2:15 PM  Result Value Ref Range Status   Specimen Description BLOOD BLOOD LEFT FOREARM  Final   Special Requests AEROBIC BOTTLE ONLY 5CC  Final   Culture NO GROWTH 2 DAYS  Final   Report Status PENDING  Incomplete  Urine culture     Status: None   Collection Time: 07/12/16  3:13 PM  Result Value Ref Range Status   Specimen Description URINE, RANDOM  Final   Special Requests NONE  Final   Culture NO GROWTH  Final   Report Status 07/13/2016 FINAL  Final    Radiology Reports Dg Chest 2 View  Result Date: 07/12/2016 CLINICAL DATA:  Sepsis, generalized weakness, recent flu symptoms, history hypertension, atrial fibrillation, stroke, coronary artery disease EXAM: CHEST  2 VIEW COMPARISON:  07/08/2016 FINDINGS: RIGHT arm PICC line tip projects over SVC. Minimal enlargement of cardiac silhouette post CABG, MVR, and AVR. Atherosclerotic calcification aorta. Mild pulmonary vascular congestion. Bibasilar atelectasis. Lungs otherwise clear.  No pleural effusion or pneumothorax. Osseous demineralization. IMPRESSION: Minimal enlargement of cardiac silhouette post CABG, AVR and MVR. Aortic atherosclerosis. Bibasilar atelectasis. Electronically Signed   By: Lavonia Dana M.D.   On: 07/12/2016 13:58   Ct Head Wo Contrast  Result Date: 07/12/2016 CLINICAL DATA:  Confusion, lethargy, body weakness and slurred speech beginning today, history stroke, coronary artery disease post CABG, atrial fibrillation, hypertension EXAM: CT HEAD WITHOUT CONTRAST TECHNIQUE: Contiguous axial images were obtained from the base of the skull through the vertex without intravenous contrast. COMPARISON:  None FINDINGS: Brain: Generalized atrophy. Normal ventricular morphology. No midline shift or mass effect. Mild small vessel chronic ischemic changes of deep cerebral white matter. No intracranial hemorrhage, mass lesion or evidence acute infarction. No extra-axial fluid collections. Vascular: Extensive atherosclerotic calcification of internal carotid and vertebrobasilar arteries at skullbase. Skull: Intact Sinuses/Orbits: Clear Other: N/A IMPRESSION: Atrophy with mild small vessel chronic ischemic changes of deep cerebral white matter. No acute intracranial abnormalities. Electronically Signed   By: Lavonia Dana M.D.   On: 07/12/2016 16:20   Mr Jodene Nam Head Wo Contrast  Result Date: 07/14/2016 CLINICAL DATA:  Altered mental status. EXAM: MRI HEAD WITHOUT CONTRAST MRA HEAD WITHOUT CONTRAST TECHNIQUE: Multiplanar, multiecho pulse sequences of the brain and surrounding structures were obtained without intravenous contrast. Angiographic images of the head were obtained using MRA technique without contrast. COMPARISON:  Same study from yesterday FINDINGS: MRI HEAD FINDINGS Brain: Unchanged DWI hyperintensity with isodense ADC map in the bilateral inferior thalamus and right upper midbrain. No new abnormality to suggest acute infarct, hemorrhage, hydrocephalus, or mass effect.  Extensive motion degradation in this altercation. Subtle findings could be obscured. Vascular: Arterial findings below. Unremarkable dural venous sinuses and deep venous flow voids. Skull and upper cervical spine: Negative Sinuses/Orbits: Cataract resection  on the left. MRA HEAD FINDINGS The carotid and vertebral arteries are distorted by motion but are widely patent. Non degraded basilar has a smooth widely patent appearance. There is a large posterior communicating artery on the right with fetal type PCA anatomy. On source images there is suggestion of a top basilar infundibulum from hypoplastic P1 or perforator, relationship to the thalamus/midbrain findings uncertain as the associated vessel is below the resolution of MRA. Mild for age atherosclerotic irregularity of bilateral MCA branches. No major branch occlusion. Negative for aneurysm. IMPRESSION: 1. Motion degraded brain MRI with stable findings in the bilateral thalamus and right midbrain. No revision to prior differential considerations. 2. No structural explanation for partial seizures from the left temporal lobe. 3. Motion degraded but better quality MRA. No major vessel occlusion or flow limiting stenosis. Electronically Signed   By: Monte Fantasia M.D.   On: 07/14/2016 13:06   Mr Brain Wo Contrast  Result Date: 07/14/2016 CLINICAL DATA:  Altered mental status. EXAM: MRI HEAD WITHOUT CONTRAST MRA HEAD WITHOUT CONTRAST TECHNIQUE: Multiplanar, multiecho pulse sequences of the brain and surrounding structures were obtained without intravenous contrast. Angiographic images of the head were obtained using MRA technique without contrast. COMPARISON:  Same study from yesterday FINDINGS: MRI HEAD FINDINGS Brain: Unchanged DWI hyperintensity with isodense ADC map in the bilateral inferior thalamus and right upper midbrain. No new abnormality to suggest acute infarct, hemorrhage, hydrocephalus, or mass effect. Extensive motion degradation in this  altercation. Subtle findings could be obscured. Vascular: Arterial findings below. Unremarkable dural venous sinuses and deep venous flow voids. Skull and upper cervical spine: Negative Sinuses/Orbits: Cataract resection on the left. MRA HEAD FINDINGS The carotid and vertebral arteries are distorted by motion but are widely patent. Non degraded basilar has a smooth widely patent appearance. There is a large posterior communicating artery on the right with fetal type PCA anatomy. On source images there is suggestion of a top basilar infundibulum from hypoplastic P1 or perforator, relationship to the thalamus/midbrain findings uncertain as the associated vessel is below the resolution of MRA. Mild for age atherosclerotic irregularity of bilateral MCA branches. No major branch occlusion. Negative for aneurysm. IMPRESSION: 1. Motion degraded brain MRI with stable findings in the bilateral thalamus and right midbrain. No revision to prior differential considerations. 2. No structural explanation for partial seizures from the left temporal lobe. 3. Motion degraded but better quality MRA. No major vessel occlusion or flow limiting stenosis. Electronically Signed   By: Monte Fantasia M.D.   On: 07/14/2016 13:06   Mr Brain Wo Contrast  Result Date: 07/13/2016 CLINICAL DATA:  Generalized weakness. Altered mental status. Recent mitral valve endocarditis. EXAM: MRI HEAD WITHOUT CONTRAST MRA HEAD WITHOUT CONTRAST TECHNIQUE: Multiplanar, multiecho pulse sequences of the brain and surrounding structures were obtained without intravenous contrast. Angiographic images of the head were obtained using MRA technique without contrast. COMPARISON:  Yesterday FINDINGS: MRI HEAD FINDINGS Brain: Hyperintense DWI signal along the lower thalamus bilaterally extending into the upper right midbrain right of the cerebral aquaduct. These areas are isointense on ADC map. The deep gray nuclei corpus callosum have normal signal and appearance.  The mamillary bodies are not well covered due to slice selection and motion artifact. Mild signal abnormality in the cerebral white matter and dorsal pons, likely chronic microvascular ischemia. Normal brain volume. No hemorrhage or hydrocephalus. Negative for mass. Vascular: Major flow voids are preserved, including the deep veins and basilar. Skull and upper cervical spine: No marrow lesion noted. Sinuses/Orbits:  Left cataract resection. MRA HEAD FINDINGS Extremely motion degraded exam. Poorly visualized proximal right P1 segment is attributed to fetal type circulation based on contemporaneous T2 weighted acquisition. There is likely bilateral atherosclerotic irregularity. No proximal occlusion noted. IMPRESSION: 1. Abnormal signal in the lower thalami and right midbrain favor subacute infarct from thalamoperforators or artery of Percheron. Would correlate for symptoms/risk factors of Wernicke's, which can also affect these structures. 2. Extremely limited intracranial MRA due to more motion artifact. No proximal occlusion noted. Electronically Signed   By: Monte Fantasia M.D.   On: 07/13/2016 11:05   Dg Chest Port 1 View  Result Date: 06/28/2016 CLINICAL DATA:  Central line placement. EXAM: PORTABLE CHEST 1 VIEW COMPARISON:  None. FINDINGS: The patient's right PICC is noted ending about the mid SVC. Mild vascular congestion is noted. Mild peribronchial thickening is seen. Minimal bilateral atelectasis is seen. No pleural effusion or pneumothorax is identified. The cardiomediastinal silhouette is mildly enlarged. The patient is status post median sternotomy. No acute osseous abnormalities are identified. IMPRESSION: 1. Right PICC noted ending about the mid SVC. 2. Mild vascular congestion and mild cardiomegaly. Peribronchial thickening seen. Minimal bilateral atelectasis noted. Electronically Signed   By: Garald Balding M.D.   On: 06/28/2016 16:48   Dg Abdomen Acute W/chest  Result Date:  07/08/2016 CLINICAL DATA:  Epigastric pain tonight. EXAM: DG ABDOMEN ACUTE W/ 1V CHEST COMPARISON:  Chest 06/28/2016 FINDINGS: Postoperative changes in the mediastinum. Right PICC line with tip over the low SVC region. Shallow inspiration. Heart size and pulmonary vascularity are normal. Linear scarring or atelectasis in the mid lungs, similar to prior study. Calcified and tortuous aorta. Prominent upper abdominal vascular calcifications. Scattered gas and stool throughout the colon. No small or large bowel distention. No free intra- abdominal air. No abnormal air-fluid levels. Calcifications in the pelvis consistent with fibroids. Vascular calcifications in the aorta and iliac arteries. Degenerative changes in the spine and hips. IMPRESSION: No evidence of active pulmonary disease. Nonobstructive bowel gas pattern. Vascular calcifications. Calcified fibroids. Electronically Signed   By: Lucienne Capers M.D.   On: 07/08/2016 06:03   Mr Jodene Nam Head/brain OZ Cm  Result Date: 07/13/2016 CLINICAL DATA:  Generalized weakness. Altered mental status. Recent mitral valve endocarditis. EXAM: MRI HEAD WITHOUT CONTRAST MRA HEAD WITHOUT CONTRAST TECHNIQUE: Multiplanar, multiecho pulse sequences of the brain and surrounding structures were obtained without intravenous contrast. Angiographic images of the head were obtained using MRA technique without contrast. COMPARISON:  Yesterday FINDINGS: MRI HEAD FINDINGS Brain: Hyperintense DWI signal along the lower thalamus bilaterally extending into the upper right midbrain right of the cerebral aquaduct. These areas are isointense on ADC map. The deep gray nuclei corpus callosum have normal signal and appearance. The mamillary bodies are not well covered due to slice selection and motion artifact. Mild signal abnormality in the cerebral white matter and dorsal pons, likely chronic microvascular ischemia. Normal brain volume. No hemorrhage or hydrocephalus. Negative for mass.  Vascular: Major flow voids are preserved, including the deep veins and basilar. Skull and upper cervical spine: No marrow lesion noted. Sinuses/Orbits: Left cataract resection. MRA HEAD FINDINGS Extremely motion degraded exam. Poorly visualized proximal right P1 segment is attributed to fetal type circulation based on contemporaneous T2 weighted acquisition. There is likely bilateral atherosclerotic irregularity. No proximal occlusion noted. IMPRESSION: 1. Abnormal signal in the lower thalami and right midbrain favor subacute infarct from thalamoperforators or artery of Percheron. Would correlate for symptoms/risk factors of Wernicke's, which can also affect these structures.  2. Extremely limited intracranial MRA due to more motion artifact. No proximal occlusion noted. Electronically Signed   By: Monte Fantasia M.D.   On: 07/13/2016 11:05     Anabia Weatherwax M.D on 07/14/2016 at 1:14 PM  Between 7am to 7pm - Pager - [phone]  After 7pm go to [email or web address] - password [email or web address]  [facility] -  Office  [phone]

## 2016-07-14 NOTE — Care Management Note (Signed)
Case Management Note  Patient Details  Name: Ernesto Lashway MRN: [id number] Date of Birth: 02-Apr-1934  Subjective/Objective:                  . Weakness   Action/Plan: Discharge planning Expected Discharge Date:                  Expected Discharge Plan:  Home/Self Care  In-House Referral:     Discharge planning Services  CM Consult  Post Acute Care Choice:    Choice offered to:  Adult Children  DME Arranged:    DME Agency:     HH Arranged:    HH Agency:     Status of Service:     If discussed at [place] of Stay Meetings, dates discussed:    Additional Comments: CM spoke with daughter of pt, Dorita Fray, [phone] who states pt is a permanent legal residence without insurance.  Pt lives in [place] and Cm gave Respite resources to [facility] (in pt's room) for respite care.  Cm also discussed Affordable Care Act for insurance purposes and provided Legal [employer] of [place] as a resource for an insurance navigator to meet with the family to help the family navigate the system to secure insurance for pt (on AVS).  CM will continue to follow for needs.  Dellie Catholic, RN 07/14/2016, 3:18 PM

## 2016-07-14 NOTE — Evaluation (Signed)
Physical Therapy Evaluation Patient Details Name: Alexis [name] MRN: [id number] DOB: 12/01/1933 Today's Date: 07/14/2016   History of Present Illness  Patient is a 83 year old female history of hypertension, coronary artery disease status post CABG in 2004, CVA, atrial fibrillation on chronic Coumadin. Pt admitted for weakness.  Clinical Impression  Pt admitted with above diagnosis. Pt currently with functional limitations due to the deficits listed below (see PT Problem List). On eval, pt required min assist bed mobility, min guard assist transfers, and min guard assist ambulation 150 feet without AD. She needed encouragement from her daughter to participate but reported feeling better after walking. Pt will benefit from skilled PT to increase their independence and safety with mobility to allow discharge to the venue listed below.       Follow Up Recommendations Home health PT;Supervision/Assistance - 24 hour    Equipment Recommendations  None recommended by PT    Recommendations for Other Services       Precautions / Restrictions Precautions Precautions: None Restrictions Weight Bearing Restrictions: No      Mobility  Bed Mobility         Supine to sit: Min assist Sit to supine: Supervision   General bed mobility comments: assist to elevate trunk from supine  Transfers Overall transfer level: Needs assistance   Transfers: Sit to/from Stand;Stand Pivot Transfers Sit to Stand: Min guard Stand pivot transfers: Min guard       General transfer comment: Min guard for safety only  Ambulation/Gait Ambulation/Gait assistance: Min guard Ambulation Distance (Feet): 150 Feet Assistive device: None Gait Pattern/deviations: Decreased stride length;Drifts right/left Gait velocity: decreased Gait velocity interpretation: Below normal speed for age/gender General Gait Details: slow, steady gait  Stairs            Wheelchair Mobility    Modified Rankin  (Stroke Patients Only)       Balance   Sitting-balance support: Feet supported;No upper extremity supported Sitting balance-Leahy Scale: Good     Standing balance support: During functional activity;No upper extremity supported Standing balance-Leahy Scale: Fair                               Pertinent Vitals/Pain Pain Assessment: No/denies pain    Home Living Family/patient expects to be discharged to:: Private residence Living Arrangements: Children Available Help at Discharge: Family;Available 24 hours/day Type of Home: House Home Access: Stairs to enter Entrance Stairs-Rails: None Entrance Stairs-Number of Steps: 1 Home Layout: One level Home Equipment: Shower seat;Grab bars - tub/shower;Walker - 4 wheels Additional Comments: patient spends winters living with daughter but in Summer months she lives in [place], [place] and lives by herself while there     Prior Function Level of Independence: Needs assistance   Gait / Transfers Assistance Needed: supervision with RW   ADL's / Homemaking Assistance Needed: Pt's daughter reports prior to January, pt was independent.  Since January, she has required supervision with ADLs and assist with IADLs         Hand Dominance   Dominant Hand: Right    Extremity/Trunk Assessment   Upper Extremity Assessment Upper Extremity Assessment: Defer to OT evaluation    Lower Extremity Assessment Lower Extremity Assessment: Overall WFL for tasks assessed    Cervical / Trunk Assessment Cervical / Trunk Assessment: Kyphotic  Communication   Communication: Interpreter utilized;Prefers language other than English (daughter interprets)  Cognition Arousal/Alertness: Awake/alert Behavior During Therapy: WFL for tasks assessed/performed Overall  Cognitive Status: Difficult to assess                 General Comments: pt is easily distracted.   She fatigued quickly making it difficult to accurately assess  cognition     General Comments      Exercises     Assessment/Plan    PT Assessment Patient needs continued PT services  PT Problem List Decreased strength;Decreased activity tolerance;Decreased balance;Decreased cognition;Decreased mobility;Decreased safety awareness          PT Treatment Interventions DME instruction;Gait training;Functional mobility training;Stair training;Balance training;Therapeutic exercise;Therapeutic activities;Patient/family education    PT Goals (Current goals can be found in the Care Plan section)  Acute Rehab PT Goals Patient Stated Goal: not stated PT Goal Formulation: With patient/family Time For Goal Achievement: 07/28/16 Potential to Achieve Goals: Good    Frequency Min 3X/week   Barriers to discharge        Co-evaluation               End of Session Equipment Utilized During Treatment: Gait belt Activity Tolerance: Patient tolerated treatment well Patient left: in bed;with call bell/phone within reach;with family/visitor present;with nursing/sitter in room Nurse Communication: Mobility status         Time: 3845-3646 PT Time Calculation (min) (ACUTE ONLY): 18 min   Charges:   PT Evaluation $PT Eval Low Complexity: 1 Procedure     PT G CodesLorriane Shire 07/14/2016, 9:57 AM

## 2016-07-14 NOTE — Progress Notes (Signed)
Per MRI, patient will not lay still for MRI, patient moving arms, legs, and head, calling out. Dr. Waldron [facility] notified. Haldol ordered by MD. RN went to MRI to administer haldol.

## 2016-07-14 NOTE — Progress Notes (Signed)
Patient daughter very concerned about patients level of anxiety. Patients daughter also verbalizes frustration concerning patients home care service and patient/family updates from treatment team. Patients daughter states people assume she "knows things just because she has lived in the [place]. for 20 years but I do not." RN provided explaination  clarified with patient daughterbarrier SW consult entered for information possible resources  available for home care, respite care and any additional "elderly" care assistance that may be available. Explaination of social workers role needed. Please use interpreter; patient and daughter speak [place]. Not acclimated to [place] culture or customs per patient daughter statement.
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
## 2016-07-14 NOTE — Progress Notes (Signed)
Neuro paged to confirm orders

## 2016-07-14 NOTE — Progress Notes (Signed)
[facility] for warfarin Indication: atrial fibrillation  Allergies  Allergen Reactions  . Ceftriaxone Other (See Comments)    Per MD progress note 2/12:  Infusion reaction.  Denies other associated symptoms such as diaphoresis, nausea, vomiting, dysuria, diarrhea, lower abdominal pain, received palpitations, swelling, difficulty breathing  . Hydralazine Hcl Other (See Comments)    Headache     Patient Measurements: Height: 5' (152.4 cm) Weight: 145 lb (65.8 kg) IBW/kg (Calculated) : 45.5  Vital Signs: Temp: 97.9 F (36.6 C) (02/18 0953) Temp Source: Oral (02/18 0953) BP: 115/49 (02/18 0953) Pulse Rate: 84 (02/18 0824)  Labs:  Recent Labs  07/12/16 1326 07/12/16 1336 07/12/16 1744 07/13/16 0510 07/14/16 0439  HGB 10.2* 10.2*  --   --  9.8*  HCT 31.2* 30.0*  --   --  30.2*  PLT 218  --   --   --  235  LABPROT  --   --  24.2* 25.3* 25.5*  INR  --   --  2.13 2.26 2.28  CREATININE 1.20* 1.30*  --   --  1.16*    Estimated Creatinine Clearance: 32.7 mL/min (by C-G formula based on SCr of 1.16 mg/dL (H)).     Assessment: 31 yoF on warfarin PTA for atrial fibrillation/mitral valve repair presents with somnolence and difficult arousing. Pt is to resume warfarin per pharmacy. INR on admit therapeutic at 2.13  INR today = 2.28  PTA Warfarin Dose = 2.5mg  Mon/Wed/Fri, 1.25mg  Tues/Thurs/Sat/Sun  Goal of Therapy:  INR 2-3 Monitor platelets by anticoagulation protocol: Yes   Plan:  -Repeat Warfarin 1.25mg  PO x1 tonight -Monitor INR, S/Sx bleeding daily  Thank you Anette Guarneri, PharmD [phone] 07/14/2016

## 2016-07-15 DIAGNOSIS — I679 Cerebrovascular disease, unspecified: Secondary | ICD-10-CM

## 2016-07-15 DIAGNOSIS — R9089 Other abnormal findings on diagnostic imaging of central nervous system: Secondary | ICD-10-CM

## 2016-07-15 DIAGNOSIS — R569 Unspecified convulsions: Secondary | ICD-10-CM

## 2016-07-15 DIAGNOSIS — G9340 Encephalopathy, unspecified: Secondary | ICD-10-CM

## 2016-07-15 LAB — BASIC METABOLIC PANEL
ANION GAP: 9 (ref 5–15)
BUN: 15 mg/dL (ref 6–20)
CALCIUM: 8.8 mg/dL — AB (ref 8.9–10.3)
CO2: 23 mmol/L (ref 22–32)
Chloride: 103 mmol/L (ref 101–111)
Creatinine, Ser: 1.19 mg/dL — ABNORMAL HIGH (ref 0.44–1.00)
GFR calc Af Amer: 49 mL/min — ABNORMAL LOW (ref >60)
GFR, EST NON AFRICAN AMERICAN: 42 mL/min — AB (ref >60)
GLUCOSE: 93 mg/dL (ref 65–99)
Potassium: 4 mmol/L (ref 3.5–5.1)
Sodium: 135 mmol/L (ref 135–145)

## 2016-07-15 LAB — CBC
HCT: 30.2 % — ABNORMAL LOW (ref 36.0–46.0)
Hemoglobin: 9.8 g/dL — ABNORMAL LOW (ref 12.0–15.0)
MCH: 26.6 pg (ref 26.0–34.0)
MCHC: 32.5 g/dL (ref 30.0–36.0)
MCV: 81.8 fL (ref 78.0–100.0)
Platelets: 234 10*3/uL (ref 150–400)
RBC: 3.69 MIL/uL — ABNORMAL LOW (ref 3.87–5.11)
RDW: 14.7 % (ref 11.5–15.5)
WBC: 7.2 10*3/uL (ref 4.0–10.5)

## 2016-07-15 LAB — PROTIME-INR
INR: 2.07
PROTHROMBIN TIME: 23.7 s — AB (ref 11.4–15.2)

## 2016-07-15 LAB — CULTURE, BLOOD (ROUTINE X 2)

## 2016-07-15 LAB — CK: Total CK: 34 U/L — ABNORMAL LOW (ref 38–234)

## 2016-07-15 MED ORDER — THIAMINE HCL 100 MG/ML IJ SOLN
500.0000 mg | Freq: Three times a day (TID) | INTRAVENOUS | Status: DC
  Administered 2016-07-15 – 2016-07-17 (×7): 500 mg via INTRAVENOUS
  Filled 2016-07-15 (×10): qty 5

## 2016-07-15 MED ORDER — ADULT MULTIVITAMIN W/MINERALS CH
1.0000 | ORAL_TABLET | Freq: Every day | ORAL | Status: DC
  Administered 2016-07-15 – 2016-07-18 (×4): 1 via ORAL
  Filled 2016-07-15 (×4): qty 1

## 2016-07-15 MED ORDER — ENSURE ENLIVE PO LIQD
237.0000 mL | ORAL | Status: DC
  Administered 2016-07-15 – 2016-07-17 (×3): 237 mL via ORAL

## 2016-07-15 MED ORDER — WARFARIN SODIUM 5 MG PO TABS
2.5000 mg | ORAL_TABLET | Freq: Once | ORAL | Status: AC
  Administered 2016-07-15: 2.5 mg via ORAL
  Filled 2016-07-15: qty 1

## 2016-07-15 MED ORDER — LEVETIRACETAM 500 MG PO TABS
500.0000 mg | ORAL_TABLET | Freq: Two times a day (BID) | ORAL | Status: DC
  Administered 2016-07-15 – 2016-07-18 (×6): 500 mg via ORAL
  Filled 2016-07-15 (×6): qty 1

## 2016-07-15 MED ORDER — CYANOCOBALAMIN 1000 MCG/ML IJ SOLN
1000.0000 ug | Freq: Every day | INTRAMUSCULAR | Status: DC
  Administered 2016-07-15 – 2016-07-18 (×4): 1000 ug via INTRAMUSCULAR
  Filled 2016-07-15 (×4): qty 1

## 2016-07-15 NOTE — Progress Notes (Addendum)
PROGRESS NOTE                                                                                                                                                                                                             Patient Demographics:    Alexis [name], is a 83 y.o. female, DOB - 27-Jun-1933, HUO:[id number]  Admit date - 07/12/2016   Admitting Physician Albertine Patricia, MD  Outpatient Primary MD for the patient is [name], MD  LOS - 2 Chief Complaint  Patient presents with  . Weakness    Pt was recently tx and discharged for endocarditis comes in today for weakness       Brief Narrative   83 y.o. female, with medical history significant of atrial fibrillation, CAD, CVA, mitral valve replacement, CABG recently admitted for strep bacteremia with mitral valve endocarditis, another admission secondary to reaction to Rocephin that was changed to ampicillin and gentamicin, was just discharged 07/09/2016, He presents with altered mental status and slurred speech, admitted for further workup, EEG was significant for seizures, MRI/MRA brain was poor quality, Had to be repeated with findings significant for DWI hyperintensity with isodense ADC map in the bilateral inferior thalamus and right upper midbrain  Subjective:    Alexis [name] today has, No headache, No chest pain, No abdominal pain,No fever or acute events overnight  Assessment  & Plan :    Active Problems:   Congestive heart failure (HCC)   Bacteremia due to Streptococcus   Atrial fibrillation (HCC)   Streptococcal endocarditis   Mitral valve replaced   Adjustment disorder with mixed anxiety and depressed mood   TIA (transient ischemic attack)   Acute Encephalopathy - An episode of confusion, slurred speech, generalized weakness, and worsening visual acuity , she is afebrile, no evidence of active infection, negative urinalysis, lactic acid within normal limits, his x-ray  with no evidence of acute cardiopulmonary disease. - Neurology input greatly appreciated,EEG suspicious of seizures, as well as possible Wernicke's encephalopathy  - As well patient with low B-12 level, she is started on supplement  Seizures - D/W neurology , based on EEG findings, continue with Keppra 500 mg oral twice a day - Patient is on penicillin for endocarditis which lowers her seizure threshold, see discussion below regarding antibiotics  Wernicke encephalopathy - This was discussed with the neurology,  MRI findings suspicious for Wernicke encephalopathy, thiamin level is pending, the recommendation is to continue with thiamine 500 mg IV 3 times a day for 3 days, then transitione to 100 mg oral daily   Streptococcus viridans bacteremia with mitral valve endocarditis - She is treated with IV antibiotic regimen including IV ampicillin and IV gentamicin through PICC line(antibiotic regimen recently change secondary to reaction to Rocephin) - Given new onset seizures, possibly related to beta-lactam antibiotics, discussed with ID Dr Tommy Medal regarding antibiotic regimen, stable to be either vancomycin and gentamicin or daptomycin and gentamicin, given fear of significant nephrotoxicity from vancomycin and gentamicin, will change with daptomycin and gentamicin to finish her total 6 weeks course.  Hypertension - Blood pressure acceptable, continue with home medication  Atrial fibrillation - CHA2DS2VASC score 6 - Continue bisoprolol for rate control. - On warfarin, pharmacy to dose  CKD stage III - At baseline, continue to monitor  Hyperlipidemia - Continue with Lipitor    Code Status : Full  Family Communication  : spoke with daughter via phone  Disposition Plan  : home with home PT in 2 days.  Consults  :  neurology  Procedures  : none  DVT Prophylaxis  :  On warfarin  Lab Results  Component Value Date   PLT 234 07/15/2016    Antibiotics  :     Anti-infectives    Start     Dose/Rate Route Frequency Ordered Stop   07/14/16 1800  penicillin G potassium 6 Million Units in dextrose 5 % 500 mL continuous infusion  Status:  Discontinued     6 Million Units 41.7 mL/hr over 12 Hours Intravenous Every 12 hours 07/14/16 1304 07/14/16 1325   07/14/16 1600  DAPTOmycin (CUBICIN) 500 mg in sodium chloride 0.9 % IVPB     500 mg 220 mL/hr over 30 Minutes Intravenous Every 24 hours 07/14/16 1337     07/13/16 2000  vancomycin (VANCOCIN) IVPB 750 mg/150 ml premix  Status:  Discontinued     750 mg 150 mL/hr over 60 Minutes Intravenous Every 24 hours 07/13/16 1931 07/14/16 1325   07/13/16 1400  penicillin G potassium 6 Million Units in dextrose 5 % 500 mL continuous infusion  Status:  Discontinued     6 Million Units 41.7 mL/hr over 12 Hours Intravenous Every 12 hours 07/13/16 0805 07/14/16 1304   07/13/16 0800  gentamicin (GARAMYCIN) 60 mg in dextrose 5 % 100 mL IVPB  Status:  Discontinued     60 mg 101.5 mL/hr over 60 Minutes Intravenous Every 24 hours 07/12/16 1859 07/12/16 1902   07/12/16 2000  penicillin G potassium 6 Million Units in dextrose 5 % 500 mL continuous infusion  Status:  Discontinued     6 Million Units 41.7 mL/hr over 12 Hours Intravenous Every 12 hours 07/12/16 1859 07/13/16 0805   07/12/16 2000  gentamicin (GARAMYCIN) 60 mg in dextrose 5 % 100 mL IVPB     60 mg 101.5 mL/hr over 60 Minutes Intravenous Every 24 hours 07/12/16 1902     07/12/16 1830  gentamicin (GARAMYCIN) IVPB  Status:  Discontinued    Comments:  Indication: 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Last Day of Therapy:  08/05/16 Labs - 'Sunday/Monday:  CBC/D, BMP, and gentamicin trough. Labs - Thursday:  BMP and gentamicin trough Labs - Every other week:  ESR and CRP     60'  mg Intravenous Every 24 hours 07/12/16 1825 07/12/16 1859   07/12/16 1830  penicillin G potassium IVPB  Status:  Discontinued    Comments:  as continuous infusion. Indication: Prosthetic valve  endocarditis  Last Day of Therapy:  08/05/16 Labs - Once weekly:  CBC/D and BMP, Labs - Every other week:  ESR and CRP     12 Million Units Intravenous Daily 07/12/16 1825 07/12/16 1859        Objective:   Vitals:   07/14/16 2229 07/15/16 0215 07/15/16 0440 07/15/16 1004  BP: 140/67 120/62 107/62 111/65  Pulse: 71 67 80 81  Resp: '18 16 16 19  ' Temp: 99 F (37.2 C) 98.2 F (36.8 C) 98.4 F (36.9 C) 98.2 F (36.8 C)  TempSrc: Oral Axillary Oral Oral  SpO2: 100% 100% 95% 99%  Weight:      Height:        Wt Readings from Last 3 Encounters:  07/12/16 65.8 kg (145 lb)  07/09/16 71.4 kg (157 lb 4.8 oz)  06/28/16 69.4 kg (153 lb)     Intake/Output Summary (Last 24 hours) at 07/15/16 1343 Last data filed at 07/14/16 2100  Gross per 24 hour  Intake            676.5 ml  Output                0 ml  Net            676.5 ml     Physical Exam  Sleeping comfortably  Supple Neck,No JVD,  Symmetrical Chest wall movement, Good air movement bilaterally, CTAB RRR,No Gallops,Rubs or new Murmurs, No Parasternal Heave +ve B.Sounds, Abd Soft, No tenderness, No rebound - guarding or rigidity. No Cyanosis, Clubbing or edema, No new Rash or bruise      Data Review:    CBC  Recent Labs Lab 07/09/16 0548 07/12/16 1326 07/12/16 1336 07/14/16 0439 07/15/16 0427  WBC 5.9 7.6  --  7.0 7.2  HGB 9.7* 10.2* 10.2* 9.8* 9.8*  HCT 29.5* 31.2* 30.0* 30.2* 30.2*  PLT 168 218  --  235 234  MCV 81.3 83.2  --  82.7 81.8  MCH 26.7 27.2  --  26.8 26.6  MCHC 32.9 32.7  --  32.5 32.5  RDW 15.1 15.5  --  15.1 14.7  LYMPHSABS  --  0.7  --   --   --   MONOABS  --  0.8  --   --   --   EOSABS  --  0.1  --   --   --   BASOSABS  --  0.0  --   --   --     Chemistries   Recent Labs Lab 07/09/16 0548 07/12/16 1326 07/12/16 1336 07/14/16 0439 07/15/16 0427  NA 130* 137 138 133* 135  K 3.6 4.3 4.3 4.0 4.0  CL 98* 105 104 100* 103  CO2 23 23  --  23 23  GLUCOSE 128* 101* 101* 109* 93    BUN '13 12 13 13 15  ' CREATININE 1.15* 1.20* 1.30* 1.16* 1.19*  CALCIUM 8.5* 9.1  --  8.7* 8.8*  AST 24 23  --   --   --   ALT 15 17  --   --   --   ALKPHOS 60 65  --   --   --   BILITOT 0.6 0.9  --   --   --    ------------------------------------------------------------------------------------------------------------------  Recent Labs  07/13/16 0510  CHOL 138  HDL 31*  LDLCALC 86  TRIG 105  CHOLHDL 4.5    Lab  Results  Component Value Date   HGBA1C 5.8 (H) 07/13/2016   ------------------------------------------------------------------------------------------------------------------  Recent Labs  07/13/16 0044  TSH 0.518   ------------------------------------------------------------------------------------------------------------------  Recent Labs  07/14/16 1318  VITAMINB12 198    Coagulation profile  Recent Labs Lab 07/09/16 0548 07/12/16 1744 07/13/16 0510 07/14/16 0439 07/15/16 0427  INR 2.08 2.13 2.26 2.28 2.07    No results for input(s): DDIMER in the last 72 hours.  Cardiac Enzymes No results for input(s): CKMB, TROPONINI, MYOGLOBIN in the last 168 hours.  Invalid input(s): CK ------------------------------------------------------------------------------------------------------------------ No results found for: BNP  Inpatient Medications  Scheduled Meds: . bisoprolol  2.5 mg Oral Daily  . cholecalciferol  2,000 Units Oral Daily  . cyanocobalamin  1,000 mcg Intramuscular Daily  . DAPTOmycin (CUBICIN)  IV  500 mg Intravenous Q24H  . gentamicin  60 mg Intravenous Q24H  . levETIRAcetam  500 mg Oral BID  . loratadine  10 mg Oral Daily  . ramipril  2.5 mg Oral Daily  . thiamine injection  500 mg Intravenous TID  . warfarin  2.5 mg Oral ONCE-1800  . Warfarin - Pharmacist Dosing Inpatient   Does not apply q1800   Continuous Infusions: PRN Meds:.acetaminophen **OR** acetaminophen (TYLENOL) oral liquid 160 mg/5 mL **OR** acetaminophen, sodium  chloride flush  Micro Results Recent Results (from the past 240 hour(s))  Culture, blood (routine x 2)     Status: None   Collection Time: 07/08/16 11:40 AM  Result Value Ref Range Status   Specimen Description BLOOD LEFT ASSIST CONTROL  Final   Special Requests BOTTLES DRAWN AEROBIC AND ANAEROBIC 5CC EACH  Final   Culture NO GROWTH 5 DAYS  Final   Report Status 07/13/2016 FINAL  Final  Culture, blood (routine x 2)     Status: None   Collection Time: 07/08/16 11:55 AM  Result Value Ref Range Status   Specimen Description BLOOD LEFT HAND  Final   Special Requests BOTTLES DRAWN AEROBIC ONLY 5CC  Final   Culture NO GROWTH 5 DAYS  Final   Report Status 07/13/2016 FINAL  Final  Blood Culture (routine x 2)     Status: Abnormal   Collection Time: 07/12/16  1:29 PM  Result Value Ref Range Status   Specimen Description BLOOD LEFT ANTECUBITAL  Final   Special Requests BOTTLES DRAWN AEROBIC AND ANAEROBIC 5CC  Final   Culture  Setup Time   Final    GRAM POSITIVE COCCI IN CLUSTERS ANAEROBIC BOTTLE ONLY CRITICAL RESULT CALLED TO, READ BACK BY AND VERIFIED WITH: T STONE,PHARMD AT 1920 07/13/16 BY L BENFIELD    Culture (A)  Final    STAPHYLOCOCCUS SPECIES (COAGULASE NEGATIVE) THE SIGNIFICANCE OF ISOLATING THIS ORGANISM FROM A SINGLE SET OF BLOOD CULTURES WHEN MULTIPLE SETS ARE DRAWN IS UNCERTAIN. PLEASE NOTIFY THE MICROBIOLOGY DEPARTMENT WITHIN ONE WEEK IF SPECIATION AND SENSITIVITIES ARE REQUIRED.    Report Status 07/15/2016 FINAL  Final  Blood Culture ID Panel (Reflexed)     Status: Abnormal   Collection Time: 07/12/16  1:29 PM  Result Value Ref Range Status   Enterococcus species NOT DETECTED NOT DETECTED Final   Listeria monocytogenes NOT DETECTED NOT DETECTED Final   Staphylococcus species DETECTED (A) NOT DETECTED Final    Comment: Methicillin (oxacillin) resistant coagulase negative staphylococcus. Possible blood culture contaminant (unless isolated from more than one blood culture draw  or clinical case suggests pathogenicity). No antibiotic treatment is indicated for blood  culture contaminants. CRITICAL RESULT CALLED TO, READ BACK BY AND  VERIFIED WITH: T STONE,PHARMD AT 1920 07/13/16 BY L BENFIELD    Staphylococcus aureus NOT DETECTED NOT DETECTED Final   Methicillin resistance DETECTED (A) NOT DETECTED Final    Comment: CRITICAL RESULT CALLED TO, READ BACK BY AND VERIFIED WITH: T STONE,PHARMD AT 1920 07/13/16 BY L BENFIELD    Streptococcus species NOT DETECTED NOT DETECTED Final   Streptococcus agalactiae NOT DETECTED NOT DETECTED Final   Streptococcus pneumoniae NOT DETECTED NOT DETECTED Final   Streptococcus pyogenes NOT DETECTED NOT DETECTED Final   Acinetobacter baumannii NOT DETECTED NOT DETECTED Final   Enterobacteriaceae species NOT DETECTED NOT DETECTED Final   Enterobacter cloacae complex NOT DETECTED NOT DETECTED Final   Escherichia coli NOT DETECTED NOT DETECTED Final   Klebsiella oxytoca NOT DETECTED NOT DETECTED Final   Klebsiella pneumoniae NOT DETECTED NOT DETECTED Final   Proteus species NOT DETECTED NOT DETECTED Final   Serratia marcescens NOT DETECTED NOT DETECTED Final   Haemophilus influenzae NOT DETECTED NOT DETECTED Final   Neisseria meningitidis NOT DETECTED NOT DETECTED Final   Pseudomonas aeruginosa NOT DETECTED NOT DETECTED Final   Candida albicans NOT DETECTED NOT DETECTED Final   Candida glabrata NOT DETECTED NOT DETECTED Final   Candida krusei NOT DETECTED NOT DETECTED Final   Candida parapsilosis NOT DETECTED NOT DETECTED Final   Candida tropicalis NOT DETECTED NOT DETECTED Final  Blood Culture (routine x 2)     Status: None (Preliminary result)   Collection Time: 07/12/16  2:15 PM  Result Value Ref Range Status   Specimen Description BLOOD BLOOD LEFT FOREARM  Final   Special Requests AEROBIC BOTTLE ONLY 5CC  Final   Culture NO GROWTH 3 DAYS  Final   Report Status PENDING  Incomplete  Urine culture     Status: None   Collection  Time: 07/12/16  3:13 PM  Result Value Ref Range Status   Specimen Description URINE, RANDOM  Final   Special Requests NONE  Final   Culture NO GROWTH  Final   Report Status 07/13/2016 FINAL  Final    Radiology Reports Dg Chest 2 View  Result Date: 07/12/2016 CLINICAL DATA:  Sepsis, generalized weakness, recent flu symptoms, history hypertension, atrial fibrillation, stroke, coronary artery disease EXAM: CHEST  2 VIEW COMPARISON:  07/08/2016 FINDINGS: RIGHT arm PICC line tip projects over SVC. Minimal enlargement of cardiac silhouette post CABG, MVR, and AVR. Atherosclerotic calcification aorta. Mild pulmonary vascular congestion. Bibasilar atelectasis. Lungs otherwise clear. No pleural effusion or pneumothorax. Osseous demineralization. IMPRESSION: Minimal enlargement of cardiac silhouette post CABG, AVR and MVR. Aortic atherosclerosis. Bibasilar atelectasis. Electronically Signed   By: Lavonia Dana M.D.   On: 07/12/2016 13:58   Ct Head Wo Contrast  Result Date: 07/12/2016 CLINICAL DATA:  Confusion, lethargy, body weakness and slurred speech beginning today, history stroke, coronary artery disease post CABG, atrial fibrillation, hypertension EXAM: CT HEAD WITHOUT CONTRAST TECHNIQUE: Contiguous axial images were obtained from the base of the skull through the vertex without intravenous contrast. COMPARISON:  None FINDINGS: Brain: Generalized atrophy. Normal ventricular morphology. No midline shift or mass effect. Mild small vessel chronic ischemic changes of deep cerebral white matter. No intracranial hemorrhage, mass lesion or evidence acute infarction. No extra-axial fluid collections. Vascular: Extensive atherosclerotic calcification of internal carotid and vertebrobasilar arteries at skullbase. Skull: Intact Sinuses/Orbits: Clear Other: N/A IMPRESSION: Atrophy with mild small vessel chronic ischemic changes of deep cerebral white matter. No acute intracranial abnormalities. Electronically Signed    By: Lavonia Dana M.D.   On:  07/12/2016 16:20   Mr Jodene Nam Head Wo Contrast  Result Date: 07/14/2016 CLINICAL DATA:  Altered mental status. EXAM: MRI HEAD WITHOUT CONTRAST MRA HEAD WITHOUT CONTRAST TECHNIQUE: Multiplanar, multiecho pulse sequences of the brain and surrounding structures were obtained without intravenous contrast. Angiographic images of the head were obtained using MRA technique without contrast. COMPARISON:  Same study from yesterday FINDINGS: MRI HEAD FINDINGS Brain: Unchanged DWI hyperintensity with isodense ADC map in the bilateral inferior thalamus and right upper midbrain. No new abnormality to suggest acute infarct, hemorrhage, hydrocephalus, or mass effect. Extensive motion degradation in this altercation. Subtle findings could be obscured. Vascular: Arterial findings below. Unremarkable dural venous sinuses and deep venous flow voids. Skull and upper cervical spine: Negative Sinuses/Orbits: Cataract resection on the left. MRA HEAD FINDINGS The carotid and vertebral arteries are distorted by motion but are widely patent. Non degraded basilar has a smooth widely patent appearance. There is a large posterior communicating artery on the right with fetal type PCA anatomy. On source images there is suggestion of a top basilar infundibulum from hypoplastic P1 or perforator, relationship to the thalamus/midbrain findings uncertain as the associated vessel is below the resolution of MRA. Mild for age atherosclerotic irregularity of bilateral MCA branches. No major branch occlusion. Negative for aneurysm. IMPRESSION: 1. Motion degraded brain MRI with stable findings in the bilateral thalamus and right midbrain. No revision to prior differential considerations. 2. No structural explanation for partial seizures from the left temporal lobe. 3. Motion degraded but better quality MRA. No major vessel occlusion or flow limiting stenosis. Electronically Signed   By: Monte Fantasia M.D.   On: 07/14/2016 13:06     Mr Brain Wo Contrast  Result Date: 07/14/2016 CLINICAL DATA:  Altered mental status. EXAM: MRI HEAD WITHOUT CONTRAST MRA HEAD WITHOUT CONTRAST TECHNIQUE: Multiplanar, multiecho pulse sequences of the brain and surrounding structures were obtained without intravenous contrast. Angiographic images of the head were obtained using MRA technique without contrast. COMPARISON:  Same study from yesterday FINDINGS: MRI HEAD FINDINGS Brain: Unchanged DWI hyperintensity with isodense ADC map in the bilateral inferior thalamus and right upper midbrain. No new abnormality to suggest acute infarct, hemorrhage, hydrocephalus, or mass effect. Extensive motion degradation in this altercation. Subtle findings could be obscured. Vascular: Arterial findings below. Unremarkable dural venous sinuses and deep venous flow voids. Skull and upper cervical spine: Negative Sinuses/Orbits: Cataract resection on the left. MRA HEAD FINDINGS The carotid and vertebral arteries are distorted by motion but are widely patent. Non degraded basilar has a smooth widely patent appearance. There is a large posterior communicating artery on the right with fetal type PCA anatomy. On source images there is suggestion of a top basilar infundibulum from hypoplastic P1 or perforator, relationship to the thalamus/midbrain findings uncertain as the associated vessel is below the resolution of MRA. Mild for age atherosclerotic irregularity of bilateral MCA branches. No major branch occlusion. Negative for aneurysm. IMPRESSION: 1. Motion degraded brain MRI with stable findings in the bilateral thalamus and right midbrain. No revision to prior differential considerations. 2. No structural explanation for partial seizures from the left temporal lobe. 3. Motion degraded but better quality MRA. No major vessel occlusion or flow limiting stenosis. Electronically Signed   By: Monte Fantasia M.D.   On: 07/14/2016 13:06   Mr Brain Wo Contrast  Result Date:  07/13/2016 CLINICAL DATA:  Generalized weakness. Altered mental status. Recent mitral valve endocarditis. EXAM: MRI HEAD WITHOUT CONTRAST MRA HEAD WITHOUT CONTRAST TECHNIQUE: Multiplanar, multiecho pulse sequences  of the brain and surrounding structures were obtained without intravenous contrast. Angiographic images of the head were obtained using MRA technique without contrast. COMPARISON:  Yesterday FINDINGS: MRI HEAD FINDINGS Brain: Hyperintense DWI signal along the lower thalamus bilaterally extending into the upper right midbrain right of the cerebral aquaduct. These areas are isointense on ADC map. The deep gray nuclei corpus callosum have normal signal and appearance. The mamillary bodies are not well covered due to slice selection and motion artifact. Mild signal abnormality in the cerebral white matter and dorsal pons, likely chronic microvascular ischemia. Normal brain volume. No hemorrhage or hydrocephalus. Negative for mass. Vascular: Major flow voids are preserved, including the deep veins and basilar. Skull and upper cervical spine: No marrow lesion noted. Sinuses/Orbits: Left cataract resection. MRA HEAD FINDINGS Extremely motion degraded exam. Poorly visualized proximal right P1 segment is attributed to fetal type circulation based on contemporaneous T2 weighted acquisition. There is likely bilateral atherosclerotic irregularity. No proximal occlusion noted. IMPRESSION: 1. Abnormal signal in the lower thalami and right midbrain favor subacute infarct from thalamoperforators or artery of [place]. Would correlate for symptoms/risk factors of Wernicke's, which can also affect these structures. 2. Extremely limited intracranial MRA due to more motion artifact. No proximal occlusion noted. Electronically Signed   By: Monte Fantasia M.D.   On: 07/13/2016 11:05   Dg Chest Port 1 View  Result Date: 06/28/2016 CLINICAL DATA:  Central line placement. EXAM: PORTABLE CHEST 1 VIEW COMPARISON:  None.  FINDINGS: The patient's right PICC is noted ending about the mid SVC. Mild vascular congestion is noted. Mild peribronchial thickening is seen. Minimal bilateral atelectasis is seen. No pleural effusion or pneumothorax is identified. The cardiomediastinal silhouette is mildly enlarged. The patient is status post median sternotomy. No acute osseous abnormalities are identified. IMPRESSION: 1. Right PICC noted ending about the mid SVC. 2. Mild vascular congestion and mild cardiomegaly. Peribronchial thickening seen. Minimal bilateral atelectasis noted. Electronically Signed   By: Garald Balding M.D.   On: 06/28/2016 16:48   Dg Abdomen Acute W/chest  Result Date: 07/08/2016 CLINICAL DATA:  Epigastric pain tonight. EXAM: DG ABDOMEN ACUTE W/ 1V CHEST COMPARISON:  Chest 06/28/2016 FINDINGS: Postoperative changes in the mediastinum. Right PICC line with tip over the low SVC region. Shallow inspiration. Heart size and pulmonary vascularity are normal. Linear scarring or atelectasis in the mid lungs, similar to prior study. Calcified and tortuous aorta. Prominent upper abdominal vascular calcifications. Scattered gas and stool throughout the colon. No small or large bowel distention. No free intra- abdominal air. No abnormal air-fluid levels. Calcifications in the pelvis consistent with fibroids. Vascular calcifications in the aorta and iliac arteries. Degenerative changes in the spine and hips. IMPRESSION: No evidence of active pulmonary disease. Nonobstructive bowel gas pattern. Vascular calcifications. Calcified fibroids. Electronically Signed   By: Lucienne Capers M.D.   On: 07/08/2016 06:03   Mr Jodene Nam Head/brain TD Cm  Result Date: 07/13/2016 CLINICAL DATA:  Generalized weakness. Altered mental status. Recent mitral valve endocarditis. EXAM: MRI HEAD WITHOUT CONTRAST MRA HEAD WITHOUT CONTRAST TECHNIQUE: Multiplanar, multiecho pulse sequences of the brain and surrounding structures were obtained without  intravenous contrast. Angiographic images of the head were obtained using MRA technique without contrast. COMPARISON:  Yesterday FINDINGS: MRI HEAD FINDINGS Brain: Hyperintense DWI signal along the lower thalamus bilaterally extending into the upper right midbrain right of the cerebral aquaduct. These areas are isointense on ADC map. The deep gray nuclei corpus callosum have normal signal and appearance. The mamillary bodies  are not well covered due to slice selection and motion artifact. Mild signal abnormality in the cerebral white matter and dorsal pons, likely chronic microvascular ischemia. Normal brain volume. No hemorrhage or hydrocephalus. Negative for mass. Vascular: Major flow voids are preserved, including the deep veins and basilar. Skull and upper cervical spine: No marrow lesion noted. Sinuses/Orbits: Left cataract resection. MRA HEAD FINDINGS Extremely motion degraded exam. Poorly visualized proximal right P1 segment is attributed to fetal type circulation based on contemporaneous T2 weighted acquisition. There is likely bilateral atherosclerotic irregularity. No proximal occlusion noted. IMPRESSION: 1. Abnormal signal in the lower thalami and right midbrain favor subacute infarct from thalamoperforators or artery of [place]heron. Would correlate for symptoms/risk factors of Wernicke's, which can also affect these structures. 2. Extremely limited intracranial MRA due to more motion artifact. No proximal occlusion noted. Electronically Signed   By: Monte Fantasia M.D.   On: 07/13/2016 11:05     Shatima Zalar M.D on 07/15/2016 at 1:43 PM  Between 7am to 7pm - Pager - [phone]  After 7pm go to [email or web address] - password [facility]  [facility] -  Office  [phone]

## 2016-07-15 NOTE — Progress Notes (Signed)
[place] for warfarin Indication: atrial fibrillation  Allergies  Allergen Reactions  . Ceftriaxone Other (See Comments)    Per MD progress note 2/12:  Infusion reaction.  Denies other associated symptoms such as diaphoresis, nausea, vomiting, dysuria, diarrhea, lower abdominal pain, received palpitations, swelling, difficulty breathing  . Hydralazine Hcl Other (See Comments)    Headache     Patient Measurements: Height: 5' (152.4 cm) Weight: 145 lb (65.8 kg) IBW/kg (Calculated) : 45.5  Vital Signs: Temp: 98.4 F (36.9 C) (02/19 0440) Temp Source: Oral (02/19 0440) BP: 107/62 (02/19 0440) Pulse Rate: 80 (02/19 0440)  Labs:  Recent Labs  07/12/16 1326 07/12/16 1336  07/13/16 0510 07/14/16 0439 07/15/16 0427  HGB 10.2* 10.2*  --   --  9.8* 9.8*  HCT 31.2* 30.0*  --   --  30.2* 30.2*  PLT 218  --   --   --  235 234  LABPROT  --   --   < > 25.3* 25.5* 23.7*  INR  --   --   < > 2.26 2.28 2.07  CREATININE 1.20* 1.30*  --   --  1.16* 1.19*  CKTOTAL  --   --   --   --   --  34*  < > = values in this interval not displayed.  Estimated Creatinine Clearance: 31.9 mL/min (by C-G formula based on SCr of 1.19 mg/dL (H)).     Assessment: 21 yoF on warfarin PTA for atrial fibrillation/mitral valve repair presents with somnolence and difficult arousing. Pt is to resume warfarin per pharmacy. INR = 2.28>2.07  PTA Warfarin Dose = 2.5mg  Mon/Wed/Fri, 1.25mg  Tues/Thurs/Sat/Sun  Goal of Therapy:  INR 2-3 Monitor platelets by anticoagulation protocol: Yes   Plan:  Warfarin 2.5mg  tonight x1 Monitor INR, S/Sx bleeding daily   Andrey Cota. Diona Foley, PharmD, [place] Clinical Pharmacist [phone] 07/15/2016

## 2016-07-15 NOTE — Progress Notes (Signed)
Subjective: No further recorded seizures.   Exam: Vitals:   07/15/16 0215 07/15/16 0440  BP: 120/62 107/62  Pulse: 67 80  Resp: 16 16  Temp: 98.2 F (36.8 C) 98.4 F (36.9 C)     Current Facility-Administered Medications  Medication Dose Route Frequency Provider Last Rate Last Dose  . acetaminophen (TYLENOL) tablet 650 mg  650 mg Oral Q4H PRN Albertine Patricia, MD       Or  . acetaminophen (TYLENOL) solution 650 mg  650 mg Per Tube Q4H PRN Albertine Patricia, MD       Or  . acetaminophen (TYLENOL) suppository 650 mg  650 mg Rectal Q4H PRN Silver Huguenin Elgergawy, MD      . bisoprolol (ZEBETA) tablet 2.5 mg  2.5 mg Oral Daily Albertine Patricia, MD   2.5 mg at 07/14/16 0944  . cholecalciferol (VITAMIN D) tablet 2,000 Units  2,000 Units Oral Daily Albertine Patricia, MD   2,000 Units at 07/14/16 [phone]  . cyanocobalamin ((VITAMIN B-12)) injection 1,000 mcg  1,000 mcg Intramuscular Daily Dawood S Elgergawy, MD      . DAPTOmycin (CUBICIN) 500 mg in sodium chloride 0.9 % IVPB  500 mg Intravenous Q24H Albertine Patricia, MD   500 mg at 07/14/16 1540  . gentamicin (GARAMYCIN) 60 mg in dextrose 5 % 100 mL IVPB  60 mg Intravenous Q24H Rebecka Apley, RPH   60 mg at 07/14/16 2100  . levETIRAcetam (KEPPRA) 500 mg in sodium chloride 0.9 % 100 mL IVPB  500 mg Intravenous Q12H Greta Doom, MD   500 mg at 07/15/16 0829  . loratadine (CLARITIN) tablet 10 mg  10 mg Oral Daily Albertine Patricia, MD   10 mg at 07/14/16 0943  . ramipril (ALTACE) capsule 2.5 mg  2.5 mg Oral Daily Albertine Patricia, MD   2.5 mg at 07/14/16 0943  . sodium chloride flush (NS) 0.9 % injection 10-40 mL  10-40 mL Intracatheter PRN Albertine Patricia, MD      . thiamine 500mg  in normal saline (1ml) IVPB  500 mg Intravenous TID Silver Huguenin Elgergawy, MD      . warfarin (COUMADIN) tablet 2.5 mg  2.5 mg Oral [place], [facility]      . Warfarin - Pharmacist Dosing Inpatient   Does not apply q1800 Einar Grad, [facility]           Gen: In bed, NAD MS: awake and follows commands CN: face symmetric, EOMI, TML,  Motor: MAEW   Pertinent Labs/Diagnostics: MRI brain shows no change since prior MRI/MRA brain.  B1 Still pending B12 129  Etta Quill PA-C [facility] [phone]  Impression: 83 y.o. female with a history of mitral valve replacement who was recently admitted with bacteremia with endocarditis. She was started on ceftriaxone, but returned with complaints of not tolerating it due to pain, therefore is changed to penicillin.  EEG showed epileptiform activity and patient started on Keppra. INR still low at 2.07. MRI shows no change. Currently on high dose thiamine.    Recommendations: 1) Would remain on current dose of Keppra and change to PO  2) Need INR 2-3 4) Continue Thiamine 100 mg Daily 3) Neurology will S/O     07/15/2016, 10:02 AM

## 2016-07-15 NOTE — Progress Notes (Signed)
Initial Nutrition Assessment   INTERVENTION:  Provide Ensure Enlive po once daily, provides 350 kcal and 20 grams of protein Provide Multivitamin with minerals daily  NUTRITION DIAGNOSIS:   Predicted suboptimal nutrient intake related to lethargy/confusion as evidenced by per patient/family report, meal completion < 25%, percent weight loss.   GOAL:   Patient will meet greater than or equal to 90% of their needs   MONITOR:   PO intake, I & O's, Labs, Skin, Supplement acceptance  REASON FOR ASSESSMENT:   Malnutrition Screening Tool    ASSESSMENT:   83 y.o. female, with medical history significant of atrial fibrillation, CAD, CVA, mitral valve replacement, CABG recently admitted for strep bacteremia with mitral valve endocarditis, another admission secondary to reaction to Rocephin that was changed to ampicillin and gentamicin, was just discharged 07/09/2016, He presents with altered mental status and slurred speech.  Pt seemingly confused at time of visit and tired. Lunch tray at bedside untouched at time of visit. RD woke patient and performed nutrition-focused physical exam. Pt history shows 8 lb wt loss in the past 2 weeks, but pt appears well-nourished. RD offered to set-up lunch tray, but pt declined. Per nursing notes, pt ate 75% of lunch and dinner and 50% of breakfast yesterday.   Labs: low hemoglobin  Diet Order:  Diet Heart Room service appropriate? Yes; Fluid consistency: Thin  Skin:  Reviewed, no issues  Last BM:  2/16  Height:   Ht Readings from Last 1 Encounters:  07/12/16 5' (1.524 m)    Weight:   Wt Readings from Last 1 Encounters:  07/12/16 145 lb (65.8 kg)    Ideal Body Weight:  45.45 kg  BMI:  Body mass index is 28.32 kg/m.  Estimated Nutritional Needs:   Kcal:  1400-1600  Protein:  75-90 grams  Fluid:  1.6 L/day  EDUCATION NEEDS:   No education needs identified at this time  Scarlette Ar RD, LDN, CSP Inpatient Clinical  Dietitian Pager: [phone] After Hours Pager: [phone]

## 2016-07-15 NOTE — Progress Notes (Signed)
Occupational Therapy Treatment Patient Details Name: Cruzita Lipa MRN: [id number] DOB: July 30, 1933 Today's Date: 07/15/2016    History of present illness Patient is a 83 year old female history of hypertension, coronary artery disease status post CABG in 2004, CVA, atrial fibrillation on chronic Coumadin. Pt admitted for weakness.   OT comments  Pt progressing with ADLs.  She requires min guard assist to min A for ADLs.    Will continue to follow.     Follow Up Recommendations  Home health OT;Supervision/Assistance - 24 hour    Equipment Recommendations  None recommended by OT    Recommendations for Other Services      Precautions / Restrictions Precautions Precautions: Fall       Mobility Bed Mobility Overal bed mobility: Needs Assistance         Sit to supine: Min guard      Transfers Overall transfer level: Needs assistance Equipment used: Rolling walker (2 wheeled) Transfers: Sit to/from [facility] Sit to Stand: Min guard Stand pivot transfers: Min guard            Balance Overall balance assessment: Needs assistance Sitting-balance support: Feet supported Sitting balance-Leahy Scale: Good     Standing balance support: During functional activity;No upper extremity supported Standing balance-Leahy Scale: Fair                     ADL Overall ADL's : Needs assistance/impaired     Grooming: Wash/dry hands;Wash/dry face;Oral care;Min guard;Standing;Brushing hair   Upper Body Bathing: Set up;Supervision/ safety;Sitting   Lower Body Bathing: Min guard;Sit to/from stand   Upper Body Dressing : Set up;Supervision/safety;Sitting   Lower Body Dressing: Minimal assistance;Sit to/from stand   Toilet Transfer: Min guard;Ambulation;Comfort height toilet;RW   Toileting- Clothing Manipulation and Hygiene: Minimal assistance;Sit to/from stand       Functional mobility during ADLs: Chief Financial Officer     Praxis      Cognition   Behavior During Therapy: [facility] for tasks assessed/performed Overall Cognitive Status: Impaired/Different from baseline Area of Impairment: Attention;Safety/judgement;Problem solving   Current Attention Level: Sustained          Problem Solving: Requires verbal cues;Difficulty sequencing General Comments: Daughter reports pt is not at baseline and has been confused.  Pt unable to dial daughter's phone number.  Requires min cues for sequencing       Exercises     Shoulder Instructions       General Comments      Pertinent Vitals/ Pain       Pain Assessment: No/denies pain  Home Living                                          Prior Functioning/Environment              Frequency  Min 2X/week        Progress Toward Goals  OT Goals(current goals can now be found in the care plan section)  Progress towards OT goals: Progressing toward goals     Plan Discharge plan remains appropriate    Co-evaluation                 End of Session Equipment Utilized During Treatment:  Rolling walker;Gait belt  OT Visit Diagnosis: Muscle weakness (generalized) (M62.81)   Activity Tolerance Patient tolerated treatment well   Patient Left in bed;with call bell/phone within reach;with bed alarm set;with family/visitor present;with nursing/sitter in room   Nurse Communication Mobility status        Time: 6886-4847 OT Time Calculation (min): 34 min  Charges: OT General Charges $OT Visit: 1 Procedure OT Treatments $Self Care/Home Management : 8-22 mins $Therapeutic Activity: 8-22 mins  [facility], OTR/L [phone]    Lucille Passy M 07/15/2016, 6:31 PM

## 2016-07-15 NOTE — Progress Notes (Signed)
Speech Language Pathology    Patient Details Name: Alexis [name] MRN: [id number] DOB: 02-Dec-1933 Today's Date: 07/15/2016 Time:  -     New order received for Speech-Language-Cognitive eval. Eval completed 2/17- no follow up needed (please see progress note).            Alexis [name] 07/15/2016, 8:10 AM   Alexis [name] [employer] [phone]

## 2016-07-16 DIAGNOSIS — E512 Wernicke's encephalopathy: Principal | ICD-10-CM

## 2016-07-16 DIAGNOSIS — R569 Unspecified convulsions: Secondary | ICD-10-CM

## 2016-07-16 LAB — PROTIME-INR
INR: 1.94
PROTHROMBIN TIME: 22.5 s — AB (ref 11.4–15.2)

## 2016-07-16 MED ORDER — VITAMIN B-12 100 MCG PO TABS
500.0000 ug | ORAL_TABLET | Freq: Every day | ORAL | Status: DC
  Administered 2016-07-18: 500 ug via ORAL
  Filled 2016-07-16: qty 5

## 2016-07-16 MED ORDER — WARFARIN SODIUM 3 MG PO TABS
3.0000 mg | ORAL_TABLET | Freq: Once | ORAL | Status: AC
  Administered 2016-07-16: 3 mg via ORAL
  Filled 2016-07-16: qty 1

## 2016-07-16 NOTE — Progress Notes (Signed)
[place] for warfarin Indication: atrial fibrillation  Allergies  Allergen Reactions  . Ceftriaxone Other (See Comments)    Per MD progress note 2/12:  Infusion reaction.  Denies other associated symptoms such as diaphoresis, nausea, vomiting, dysuria, diarrhea, lower abdominal pain, received palpitations, swelling, difficulty breathing  . Hydralazine Hcl Other (See Comments)    Headache     Patient Measurements: Height: 5' (152.4 cm) Weight: 145 lb (65.8 kg) IBW/kg (Calculated) : 45.5  Vital Signs: Temp: 98.5 F (36.9 C) (02/20 0448) Temp Source: Oral (02/20 0448) BP: 125/56 (02/20 0100) Pulse Rate: 75 (02/20 0448)  Labs:  Recent Labs  07/14/16 0439 07/15/16 0427 07/16/16 0401  HGB 9.8* 9.8*  --   HCT 30.2* 30.2*  --   PLT 235 234  --   LABPROT 25.5* 23.7* 22.5*  INR 2.28 2.07 1.94  CREATININE 1.16* 1.19*  --   CKTOTAL  --  34*  --     Estimated Creatinine Clearance: 31.9 mL/min (by C-G formula based on SCr of 1.19 mg/dL (H)).     Assessment: Alexis [name] on warfarin PTA for atrial fibrillation/mitral valve repair presents with somnolence and difficult arousing. Pt is to resume warfarin per pharmacy. INR = 2.28>2.07>1.94. Will give booster dose today.  PTA Warfarin Dose = 2.5mg  Mon/Wed/Fri, 1.25mg  Tues/Thurs/Sat/Sun  Goal of Therapy:  INR 2-3 Monitor platelets by anticoagulation protocol: Yes   Plan:  Warfarin 3mg  tonight x1 Monitor INR, S/Sx bleeding daily   Andrey Cota. Diona Foley, PharmD, [place] Clinical Pharmacist [phone] 07/16/2016

## 2016-07-16 NOTE — Progress Notes (Signed)
Physical Therapy Treatment Patient Details Name: Alexis [name] MRN: [id number] DOB: 1933-07-28 Today's Date: 07/16/2016    History of Present Illness Patient is a 83 year old female history of hypertension, coronary artery disease status post CABG in 2004, CVA, atrial fibrillation on chronic Coumadin. Pt admitted for weakness.    PT Comments    Pt performed increased mobility but required x2 seated rest breaks due to complaints of dizziness.  BP 105/60, RN aware, HR 82bpm, O2 sats 97% on RA.  Pt performed stair training in prep for d/c home.  Pt remains limited due to L LE deficits but appears more due to coordination vs. Strength.    Follow Up Recommendations  Home health PT;Supervision/Assistance - 24 hour     Equipment Recommendations  None recommended by PT    Recommendations for Other Services       Precautions / Restrictions Precautions Precautions: Fall Restrictions Weight Bearing Restrictions: No    Mobility  Bed Mobility               General bed mobility comments: Pt in recliner on arrival.    Transfers Overall transfer level: Needs assistance Equipment used: None Transfers: Sit to/from Stand Sit to Stand: Min guard;Min assist Stand pivot transfers: Min guard       General transfer comment: Min guard for safety only, demonstrates LOB to the R and required min assist to correct.   Ambulation/Gait Ambulation/Gait assistance: Min guard;Min assist Ambulation Distance (Feet): 150 Feet Assistive device: None Gait Pattern/deviations: Decreased stride length;Drifts right/left;Decreased step length - left Gait velocity: decreased Gait velocity interpretation: Below normal speed for age/gender General Gait Details: slow, steady gait, cues for L foot clearance and upper trunk contol.  Pt reached for IV pole mid gait to hold to IV pole.  Likely try a cane next session or maybe a walker, due to deficits with balance.     Stairs Stairs: Yes   Stair  Management: Two rails Number of Stairs: 2 General stair comments: Cues for safety.    Wheelchair Mobility    Modified Rankin (Stroke Patients Only) Modified Rankin (Stroke Patients Only) Pre-Morbid Rankin Score: No significant disability Modified Rankin: Moderately severe disability     Balance Overall balance assessment: Needs assistance Sitting-balance support: Feet supported Sitting balance-Leahy Scale: Good       Standing balance-Leahy Scale: Fair (to Poor.  ) Standing balance comment: requires assist x2 to correct balance but able to maintain without support for 80% of treatment.                      Cognition Arousal/Alertness: Awake/alert Behavior During Therapy: WFL for tasks assessed/performed Overall Cognitive Status: Impaired/Different from baseline (Per daughter from previous treatments.  ) Area of Impairment: Attention;Safety/judgement;Problem solving             Problem Solving: Requires verbal cues;Difficulty sequencing General Comments: Daughter reports pt is not at baseline and has been confused.  Pt unable to dial daughter's phone number.  Requires min cues for sequencing     Exercises General Exercises - Lower Extremity Ankle Circles/Pumps: AROM;Both;10 reps;Supine Quad Sets: AROM;Both;10 reps;Supine Heel Slides: AROM;Both;10 reps;Supine Hip ABduction/ADduction: AROM;Both;10 reps;Supine Straight Leg Raises: AROM;10 reps;Supine;Both    General Comments        Pertinent Vitals/Pain Pain Assessment: No/denies pain    Home Living                      Prior Function  PT Goals (current goals can now be found in the care plan section) Acute Rehab PT Goals Patient Stated Goal: not stated Potential to Achieve Goals: Good Progress towards PT goals: Progressing toward goals    Frequency    Min 3X/week      PT Plan Current plan remains appropriate    Co-evaluation             End of Session Equipment  Utilized During Treatment: Gait belt Activity Tolerance: Patient tolerated treatment well Patient left: in chair;with call bell/phone within reach;with chair alarm set Nurse Communication: Mobility status       Time: 1435-1501 PT Time Calculation (min) (ACUTE ONLY): 26 min  Charges:  $Gait Training: 8-22 mins $Therapeutic Exercise: 8-22 mins                    G Codes:       Cristela Blue 24-Jul-2016, 3:14 PM Governor Rooks, PTA pager [phone]

## 2016-07-16 NOTE — Progress Notes (Signed)
PROGRESS NOTE                                                                                                                                                                                                             Patient Demographics:    Alexis [name], is a 83 y.o. female, DOB - [birth year], KFE:[id number]  Admit date - 07/12/2016   Admitting Physician Alexis Patricia, MD  Outpatient Primary MD for the patient is Alexis Kehr, MD  LOS - 3 Chief Complaint  Patient presents with  . Weakness    Pt was recently tx and discharged for endocarditis comes in today for weakness       Brief Narrative   83 y.o. female, with medical history significant of atrial fibrillation, CAD, CVA, mitral valve replacement, CABG recently admitted for strep bacteremia with mitral valve endocarditis, another admission secondary to reaction to Rocephin that was changed to ampicillin and gentamicin, was just discharged 07/09/2016, He presents with altered mental status and slurred speech, admitted for further workup, EEG was significant for seizures, MRI/MRA brain was poor quality, Had to be repeated with findings significant for DWI hyperintensity with isodense ADC map in the bilateral inferior thalamus and right upper midbrain  Subjective:    Alexis [name] today has, No headache, No chest pain, No abdominal pain,No fever or acute events overnight  Assessment  & Plan :    Active Problems:   Congestive heart failure (HCC)   Bacteremia due to Streptococcus   Atrial fibrillation (HCC)   Streptococcal endocarditis   Mitral valve replaced   Adjustment disorder with mixed anxiety and depressed mood   TIA (transient ischemic attack)   Acute encephalopathy   Abnormal brain MRI   Seizures (HCC)   Cerebrovascular disease   Acute Encephalopathy - An episode of confusion, slurred speech, generalized weakness, and worsening visual acuity , she is afebrile, no  evidence of active infection, negative urinalysis, lactic acid within normal limits, his x-ray with no evidence of acute cardiopulmonary disease. - Neurology input greatly appreciated,EEG suspicious of seizures, as well as possible Wernicke's encephalopathy  - As well patient with low B-12 level, she is started on supplement, will continue with IM supplement for 3 days, then was transitioned to by mouth  Seizures - D/W neurology , based on EEG findings, continue with Keppra 500 mg  oral twice a day - Patient is on penicillin for endocarditis which lowers her seizure threshold, see discussion below regarding antibiotics  Wernicke encephalopathy - This was discussed with the neurology, MRI findings suspicious for Wernicke encephalopathy, thiamin level is pending, the recommendation is to continue with thiamine 500 mg IV 3 times a day for 3 days, then transitione to 100 mg oral daily   Streptococcus viridans bacteremia with mitral valve endocarditis - She is treated with IV antibiotic regimen including IV ampicillin and IV gentamicin through PICC line(antibiotic regimen recently change secondary to reaction to Rocephin) - Given new onset seizures, possibly related to beta-lactam antibiotics, discussed with ID Dr Alexis [name] regarding antibiotic regimen, stable to be either vancomycin and gentamicin or daptomycin and gentamicin, given fear of significant nephrotoxicity from vancomycin and gentamicin,so changed to  daptomycin and gentamicin to finish her total 6 weeks course (stop date March 12)  Hypertension - Blood pressure acceptable, continue with home medication  Atrial fibrillation - CHA2DS2VASC score 6 - Continue bisoprolol for rate control. - On warfarin, pharmacy to dose  CKD stage III - At baseline, continue to monitor  Hyperlipidemia - Continue with Lipitor    Code Status : Full  Family Communication  : spoke with daughter via phone  2/20.  Disposition Plan  : home with  home PT this Thursday after she finishes IV thiamine  Consults  :  neurology  Procedures  : none  DVT Prophylaxis  :  On warfarin  Lab Results  Component Value Date   PLT 234 07/15/2016    Antibiotics  :    Anti-infectives    Start     Dose/Rate Route Frequency Ordered Stop   07/14/16 1800  penicillin G potassium 6 Million Units in dextrose 5 % 500 mL continuous infusion  Status:  Discontinued     6 Million Units 41.7 mL/hr over 12 Hours Intravenous Every 12 hours 07/14/16 1304 07/14/16 1325   07/14/16 1600  DAPTOmycin (CUBICIN) 500 mg in sodium chloride 0.9 % IVPB     500 mg 220 mL/hr over 30 Minutes Intravenous Every 24 hours 07/14/16 1337     07/13/16 2000  vancomycin (VANCOCIN) IVPB 750 mg/150 ml premix  Status:  Discontinued     750 mg 150 mL/hr over 60 Minutes Intravenous Every 24 hours 07/13/16 1931 07/14/16 1325   07/13/16 1400  penicillin G potassium 6 Million Units in dextrose 5 % 500 mL continuous infusion  Status:  Discontinued     6 Million Units 41.7 mL/hr over 12 Hours Intravenous Every 12 hours 07/13/16 0805 07/14/16 1304   07/13/16 0800  gentamicin (GARAMYCIN) 60 mg in dextrose 5 % 100 mL IVPB  Status:  Discontinued     60 mg 101.5 mL/hr over 60 Minutes Intravenous Every 24 hours 07/12/16 1859 07/12/16 1902   07/12/16 2000  penicillin G potassium 6 Million Units in dextrose 5 % 500 mL continuous infusion  Status:  Discontinued     6 Million Units 41.7 mL/hr over 12 Hours Intravenous Every 12 hours 07/12/16 1859 07/13/16 0805   07/12/16 2000  gentamicin (GARAMYCIN) 60 mg in dextrose 5 % 100 mL IVPB     60 mg 101.5 mL/hr over 60 Minutes Intravenous Every 24 hours 07/12/16 1902     07/12/16 1830  gentamicin (GARAMYCIN) IVPB  Status:  Discontinued    Comments:  Indication: 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Last Day of Therapy:  08/05/16 Labs - Sunday/Monday:  CBC/D, BMP, and gentamicin trough. Labs -  Thursday:  BMP and gentamicin trough Labs - Every other week:   ESR and CRP     60 mg Intravenous Every 24 hours 07/12/16 1825 07/12/16 1859   07/12/16 1830  penicillin G potassium IVPB  Status:  Discontinued    Comments:  as continuous infusion. Indication: 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Last Day of Therapy:  08/05/16 Labs - Once weekly:  CBC/D and BMP, Labs - Every other week:  ESR and CRP     12 Million Units Intravenous Daily 07/12/16 1825 07/12/16 1859        Objective:   Vitals:   07/15/16 2122 07/16/16 0100 07/16/16 0448 07/16/16 1012  BP: 129/76 (!) 125/56  (!) 96/58  Pulse: 83 71 75 83  Resp: '18 18 18 18  ' Temp: 99.6 F (37.6 C) 98.9 F (37.2 C) 98.5 F (36.9 C) 97.7 F (36.5 C)  TempSrc: Oral Oral Oral Oral  SpO2: 95% 96% 97% 96%  Weight:      Height:        Wt Readings from Last 3 Encounters:  07/12/16 65.8 kg (145 lb)  07/09/16 71.4 kg (157 lb 4.8 oz)  06/28/16 69.4 kg (153 lb)     Intake/Output Summary (Last 24 hours) at 07/16/16 1154 Last data filed at 07/16/16 0900  Gross per 24 hour  Intake              480 ml  Output                0 ml  Net              480 ml     Physical Exam  Sleeping comfortably  Supple Neck,No JVD,  Symmetrical Chest wall movement, Good air movement bilaterally, CTAB RRR,No Gallops,Rubs or new Murmurs, No Parasternal Heave +ve B.Sounds, Abd Soft, No tenderness, No rebound - guarding or rigidity. No Cyanosis, Clubbing or edema, No new Rash or bruise      Data Review:    CBC  Recent Labs Lab 07/12/16 1326 07/12/16 1336 07/14/16 0439 07/15/16 0427  WBC 7.6  --  7.0 7.2  HGB 10.2* 10.2* 9.8* 9.8*  HCT 31.2* 30.0* 30.2* 30.2*  PLT 218  --  235 234  MCV 83.2  --  82.7 81.8  MCH 27.2  --  26.8 26.6  MCHC 32.7  --  32.5 32.5  RDW 15.5  --  15.1 14.7  LYMPHSABS 0.7  --   --   --   MONOABS 0.8  --   --   --   EOSABS 0.1  --   --   --   BASOSABS 0.0  --   --   --     Chemistries   Recent Labs Lab 07/12/16 1326 07/12/16 1336 07/14/16 0439 07/15/16 0427  NA 137 138  133* 135  K 4.3 4.3 4.0 4.0  CL 105 104 100* 103  CO2 23  --  23 23  GLUCOSE 101* 101* 109* 93  BUN '12 13 13 15  ' CREATININE 1.20* 1.30* 1.16* 1.19*  CALCIUM 9.1  --  8.7* 8.8*  AST 23  --   --   --   ALT 17  --   --   --   ALKPHOS 65  --   --   --   BILITOT 0.9  --   --   --    ------------------------------------------------------------------------------------------------------------------ No results for input(s): CHOL, HDL, LDLCALC, TRIG, CHOLHDL, LDLDIRECT in the last 72 hours.  Lab Results  Component Value Date   HGBA1C 5.8 (H) 07/13/2016   ------------------------------------------------------------------------------------------------------------------ No results for input(s): TSH, T4TOTAL, T3FREE, THYROIDAB in the last 72 hours.  Invalid input(s): FREET3 ------------------------------------------------------------------------------------------------------------------  Recent Labs  07/14/16 1318  VITAMINB12 198    Coagulation profile  Recent Labs Lab 07/12/16 1744 07/13/16 0510 07/14/16 0439 07/15/16 0427 07/16/16 0401  INR 2.13 2.26 2.28 2.07 1.94    No results for input(s): DDIMER in the last 72 hours.  Cardiac Enzymes No results for input(s): CKMB, TROPONINI, MYOGLOBIN in the last 168 hours.  Invalid input(s): CK ------------------------------------------------------------------------------------------------------------------ No results found for: BNP  Inpatient Medications  Scheduled Meds: . bisoprolol  2.5 mg Oral Daily  . cholecalciferol  2,000 Units Oral Daily  . cyanocobalamin  1,000 mcg Intramuscular Daily  . DAPTOmycin (CUBICIN)  IV  500 mg Intravenous Q24H  . feeding supplement (ENSURE ENLIVE)  237 mL Oral Q24H  . gentamicin  60 mg Intravenous Q24H  . levETIRAcetam  500 mg Oral BID  . loratadine  10 mg Oral Daily  . multivitamin with minerals  1 tablet Oral Daily  . ramipril  2.5 mg Oral Daily  . thiamine injection  500 mg Intravenous  TID  . warfarin  3 mg Oral ONCE-1800  . Warfarin - Pharmacist Dosing Inpatient   Does not apply q1800   Continuous Infusions: PRN Meds:.acetaminophen **OR** acetaminophen (TYLENOL) oral liquid 160 mg/5 mL **OR** acetaminophen, sodium chloride flush  Micro Results Recent Results (from the past 240 hour(s))  Culture, blood (routine x 2)     Status: None   Collection Time: 07/08/16 11:40 AM  Result Value Ref Range Status   Specimen Description BLOOD LEFT ASSIST CONTROL  Final   Special Requests BOTTLES DRAWN AEROBIC AND ANAEROBIC 5CC EACH  Final   Culture NO GROWTH 5 DAYS  Final   Report Status 07/13/2016 FINAL  Final  Culture, blood (routine x 2)     Status: None   Collection Time: 07/08/16 11:55 AM  Result Value Ref Range Status   Specimen Description BLOOD LEFT HAND  Final   Special Requests BOTTLES DRAWN AEROBIC ONLY 5CC  Final   Culture NO GROWTH 5 DAYS  Final   Report Status 07/13/2016 FINAL  Final  Blood Culture (routine x 2)     Status: Abnormal   Collection Time: 07/12/16  1:29 PM  Result Value Ref Range Status   Specimen Description BLOOD LEFT ANTECUBITAL  Final   Special Requests BOTTLES DRAWN AEROBIC AND ANAEROBIC 5CC  Final   Culture  Setup Time   Final    GRAM POSITIVE COCCI IN CLUSTERS ANAEROBIC BOTTLE ONLY CRITICAL RESULT CALLED TO, READ BACK BY AND VERIFIED WITH: T STONE,PHARMD AT 1920 07/13/16 BY L BENFIELD    Culture (A)  Final    STAPHYLOCOCCUS SPECIES (COAGULASE NEGATIVE) THE SIGNIFICANCE OF ISOLATING THIS ORGANISM FROM A SINGLE SET OF BLOOD CULTURES WHEN MULTIPLE SETS ARE DRAWN IS UNCERTAIN. PLEASE NOTIFY THE MICROBIOLOGY DEPARTMENT WITHIN ONE WEEK IF SPECIATION AND SENSITIVITIES ARE REQUIRED.    Report Status 07/15/2016 FINAL  Final  Blood Culture ID Panel (Reflexed)     Status: Abnormal   Collection Time: 07/12/16  1:29 PM  Result Value Ref Range Status   Enterococcus species NOT DETECTED NOT DETECTED Final   Listeria monocytogenes NOT DETECTED NOT  DETECTED Final   Staphylococcus species DETECTED (A) NOT DETECTED Final    Comment: Methicillin (oxacillin) resistant coagulase negative staphylococcus. Possible blood culture contaminant (unless isolated from more  than one blood culture draw or clinical case suggests pathogenicity). No antibiotic treatment is indicated for blood  culture contaminants. CRITICAL RESULT CALLED TO, READ BACK BY AND VERIFIED WITH: T STONE,PHARMD AT 1920 07/13/16 BY L BENFIELD    Staphylococcus aureus NOT DETECTED NOT DETECTED Final   Methicillin resistance DETECTED (A) NOT DETECTED Final    Comment: CRITICAL RESULT CALLED TO, READ BACK BY AND VERIFIED WITH: T STONE,PHARMD AT 1920 07/13/16 BY L BENFIELD    Streptococcus species NOT DETECTED NOT DETECTED Final   Streptococcus agalactiae NOT DETECTED NOT DETECTED Final   Streptococcus pneumoniae NOT DETECTED NOT DETECTED Final   Streptococcus pyogenes NOT DETECTED NOT DETECTED Final   Acinetobacter baumannii NOT DETECTED NOT DETECTED Final   Enterobacteriaceae species NOT DETECTED NOT DETECTED Final   Enterobacter cloacae complex NOT DETECTED NOT DETECTED Final   Escherichia coli NOT DETECTED NOT DETECTED Final   Klebsiella oxytoca NOT DETECTED NOT DETECTED Final   Klebsiella pneumoniae NOT DETECTED NOT DETECTED Final   Proteus species NOT DETECTED NOT DETECTED Final   Serratia marcescens NOT DETECTED NOT DETECTED Final   Haemophilus influenzae NOT DETECTED NOT DETECTED Final   Neisseria meningitidis NOT DETECTED NOT DETECTED Final   Pseudomonas aeruginosa NOT DETECTED NOT DETECTED Final   Candida albicans NOT DETECTED NOT DETECTED Final   Candida glabrata NOT DETECTED NOT DETECTED Final   Candida krusei NOT DETECTED NOT DETECTED Final   Candida parapsilosis NOT DETECTED NOT DETECTED Final   Candida tropicalis NOT DETECTED NOT DETECTED Final  Blood Culture (routine x 2)     Status: None (Preliminary result)   Collection Time: 07/12/16  2:15 PM  Result Value  Ref Range Status   Specimen Description BLOOD BLOOD LEFT FOREARM  Final   Special Requests AEROBIC BOTTLE ONLY 5CC  Final   Culture NO GROWTH 4 DAYS  Final   Report Status PENDING  Incomplete  Urine culture     Status: None   Collection Time: 07/12/16  3:13 PM  Result Value Ref Range Status   Specimen Description URINE, RANDOM  Final   Special Requests NONE  Final   Culture NO GROWTH  Final   Report Status 07/13/2016 FINAL  Final    Radiology Reports Dg Chest 2 View  Result Date: 07/12/2016 CLINICAL DATA:  Sepsis, generalized weakness, recent flu symptoms, history hypertension, atrial fibrillation, stroke, coronary artery disease EXAM: CHEST  2 VIEW COMPARISON:  07/08/2016 FINDINGS: RIGHT arm PICC line tip projects over SVC. Minimal enlargement of cardiac silhouette post CABG, MVR, and AVR. Atherosclerotic calcification aorta. Mild pulmonary vascular congestion. Bibasilar atelectasis. Lungs otherwise clear. No pleural effusion or pneumothorax. Osseous demineralization. IMPRESSION: Minimal enlargement of cardiac silhouette post CABG, AVR and MVR. Aortic atherosclerosis. Bibasilar atelectasis. Electronically Signed   By: Lavonia Dana M.D.   On: 07/12/2016 13:58   Ct Head Wo Contrast  Result Date: 07/12/2016 CLINICAL DATA:  Confusion, lethargy, body weakness and slurred speech beginning today, history stroke, coronary artery disease post CABG, atrial fibrillation, hypertension EXAM: CT HEAD WITHOUT CONTRAST TECHNIQUE: Contiguous axial images were obtained from the base of the skull through the vertex without intravenous contrast. COMPARISON:  None FINDINGS: Brain: Generalized atrophy. Normal ventricular morphology. No midline shift or mass effect. Mild small vessel chronic ischemic changes of deep cerebral white matter. No intracranial hemorrhage, mass lesion or evidence acute infarction. No extra-axial fluid collections. Vascular: Extensive atherosclerotic calcification of internal carotid and  vertebrobasilar arteries at skullbase. Skull: Intact Sinuses/Orbits: Clear Other: N/A IMPRESSION: Atrophy  with mild small vessel chronic ischemic changes of deep cerebral white matter. No acute intracranial abnormalities. Electronically Signed   By: Lavonia Dana M.D.   On: 07/12/2016 16:20   Mr Jodene Nam Head Wo Contrast  Result Date: 07/14/2016 CLINICAL DATA:  Altered mental status. EXAM: MRI HEAD WITHOUT CONTRAST MRA HEAD WITHOUT CONTRAST TECHNIQUE: Multiplanar, multiecho pulse sequences of the brain and surrounding structures were obtained without intravenous contrast. Angiographic images of the head were obtained using MRA technique without contrast. COMPARISON:  Same study from yesterday FINDINGS: MRI HEAD FINDINGS Brain: Unchanged DWI hyperintensity with isodense ADC map in the bilateral inferior thalamus and right upper midbrain. No new abnormality to suggest acute infarct, hemorrhage, hydrocephalus, or mass effect. Extensive motion degradation in this altercation. Subtle findings could be obscured. Vascular: Arterial findings below. Unremarkable dural venous sinuses and deep venous flow voids. Skull and upper cervical spine: Negative Sinuses/Orbits: Cataract resection on the left. MRA HEAD FINDINGS The carotid and vertebral arteries are distorted by motion but are widely patent. Non degraded basilar has a smooth widely patent appearance. There is a large posterior communicating artery on the right with fetal type PCA anatomy. On source images there is suggestion of a top basilar infundibulum from hypoplastic P1 or perforator, relationship to the thalamus/midbrain findings uncertain as the associated vessel is below the resolution of MRA. Mild for age atherosclerotic irregularity of bilateral MCA branches. No major branch occlusion. Negative for aneurysm. IMPRESSION: 1. Motion degraded brain MRI with stable findings in the bilateral thalamus and right midbrain. No revision to prior differential considerations.  2. No structural explanation for partial seizures from the left temporal lobe. 3. Motion degraded but better quality MRA. No major vessel occlusion or flow limiting stenosis. Electronically Signed   By: Monte Fantasia M.D.   On: 07/14/2016 13:06   Mr Brain Wo Contrast  Result Date: 07/14/2016 CLINICAL DATA:  Altered mental status. EXAM: MRI HEAD WITHOUT CONTRAST MRA HEAD WITHOUT CONTRAST TECHNIQUE: Multiplanar, multiecho pulse sequences of the brain and surrounding structures were obtained without intravenous contrast. Angiographic images of the head were obtained using MRA technique without contrast. COMPARISON:  Same study from yesterday FINDINGS: MRI HEAD FINDINGS Brain: Unchanged DWI hyperintensity with isodense ADC map in the bilateral inferior thalamus and right upper midbrain. No new abnormality to suggest acute infarct, hemorrhage, hydrocephalus, or mass effect. Extensive motion degradation in this altercation. Subtle findings could be obscured. Vascular: Arterial findings below. Unremarkable dural venous sinuses and deep venous flow voids. Skull and upper cervical spine: Negative Sinuses/Orbits: Cataract resection on the left. MRA HEAD FINDINGS The carotid and vertebral arteries are distorted by motion but are widely patent. Non degraded basilar has a smooth widely patent appearance. There is a large posterior communicating artery on the right with fetal type PCA anatomy. On source images there is suggestion of a top basilar infundibulum from hypoplastic P1 or perforator, relationship to the thalamus/midbrain findings uncertain as the associated vessel is below the resolution of MRA. Mild for age atherosclerotic irregularity of bilateral MCA branches. No major branch occlusion. Negative for aneurysm. IMPRESSION: 1. Motion degraded brain MRI with stable findings in the bilateral thalamus and right midbrain. No revision to prior differential considerations. 2. No structural explanation for partial  seizures from the left temporal lobe. 3. Motion degraded but better quality MRA. No major vessel occlusion or flow limiting stenosis. Electronically Signed   By: Monte Fantasia M.D.   On: 07/14/2016 13:06   Mr Brain Wo Contrast  Result Date:  07/13/2016 CLINICAL DATA:  Generalized weakness. Altered mental status. Recent mitral valve endocarditis. EXAM: MRI HEAD WITHOUT CONTRAST MRA HEAD WITHOUT CONTRAST TECHNIQUE: Multiplanar, multiecho pulse sequences of the brain and surrounding structures were obtained without intravenous contrast. Angiographic images of the head were obtained using MRA technique without contrast. COMPARISON:  Yesterday FINDINGS: MRI HEAD FINDINGS Brain: Hyperintense DWI signal along the lower thalamus bilaterally extending into the upper right midbrain right of the cerebral aquaduct. These areas are isointense on ADC map. The deep gray nuclei corpus callosum have normal signal and appearance. The mamillary bodies are not well covered due to slice selection and motion artifact. Mild signal abnormality in the cerebral white matter and dorsal pons, likely chronic microvascular ischemia. Normal brain volume. No hemorrhage or hydrocephalus. Negative for mass. Vascular: Major flow voids are preserved, including the deep veins and basilar. Skull and upper cervical spine: No marrow lesion noted. Sinuses/Orbits: Left cataract resection. MRA HEAD FINDINGS Extremely motion degraded exam. Poorly visualized proximal right P1 segment is attributed to fetal type circulation based on contemporaneous T2 weighted acquisition. There is likely bilateral atherosclerotic irregularity. No proximal occlusion noted. IMPRESSION: 1. Abnormal signal in the lower thalami and right midbrain favor subacute infarct from thalamoperforators or artery of [place]. Would correlate for symptoms/risk factors of Wernicke's, which can also affect these structures. 2. Extremely limited intracranial MRA due to more motion artifact.  No proximal occlusion noted. Electronically Signed   By: Monte Fantasia M.D.   On: 07/13/2016 11:05   Dg Chest Port 1 View  Result Date: 06/28/2016 CLINICAL DATA:  Central line placement. EXAM: PORTABLE CHEST 1 VIEW COMPARISON:  None. FINDINGS: The patient's right PICC is noted ending about the mid SVC. Mild vascular congestion is noted. Mild peribronchial thickening is seen. Minimal bilateral atelectasis is seen. No pleural effusion or pneumothorax is identified. The cardiomediastinal silhouette is mildly enlarged. The patient is status post median sternotomy. No acute osseous abnormalities are identified. IMPRESSION: 1. Right PICC noted ending about the mid SVC. 2. Mild vascular congestion and mild cardiomegaly. Peribronchial thickening seen. Minimal bilateral atelectasis noted. Electronically Signed   By: Garald Balding M.D.   On: 06/28/2016 16:48   Dg Abdomen Acute W/chest  Result Date: 07/08/2016 CLINICAL DATA:  Epigastric pain tonight. EXAM: DG ABDOMEN ACUTE W/ 1V CHEST COMPARISON:  Chest 06/28/2016 FINDINGS: Postoperative changes in the mediastinum. Right PICC line with tip over the low SVC region. Shallow inspiration. Heart size and pulmonary vascularity are normal. Linear scarring or atelectasis in the mid lungs, similar to prior study. Calcified and tortuous aorta. Prominent upper abdominal vascular calcifications. Scattered gas and stool throughout the colon. No small or large bowel distention. No free intra- abdominal air. No abnormal air-fluid levels. Calcifications in the pelvis consistent with fibroids. Vascular calcifications in the aorta and iliac arteries. Degenerative changes in the spine and hips. IMPRESSION: No evidence of active pulmonary disease. Nonobstructive bowel gas pattern. Vascular calcifications. Calcified fibroids. Electronically Signed   By: Lucienne Capers M.D.   On: 07/08/2016 06:03   Mr Jodene Nam Head/brain LG Cm  Result Date: 07/13/2016 CLINICAL DATA:  Generalized  weakness. Altered mental status. Recent mitral valve endocarditis. EXAM: MRI HEAD WITHOUT CONTRAST MRA HEAD WITHOUT CONTRAST TECHNIQUE: Multiplanar, multiecho pulse sequences of the brain and surrounding structures were obtained without intravenous contrast. Angiographic images of the head were obtained using MRA technique without contrast. COMPARISON:  Yesterday FINDINGS: MRI HEAD FINDINGS Brain: Hyperintense DWI signal along the lower thalamus bilaterally extending into the upper right  midbrain right of the cerebral aquaduct. These areas are isointense on ADC map. The deep gray nuclei corpus callosum have normal signal and appearance. The mamillary bodies are not well covered due to slice selection and motion artifact. Mild signal abnormality in the cerebral white matter and dorsal pons, likely chronic microvascular ischemia. Normal brain volume. No hemorrhage or hydrocephalus. Negative for mass. Vascular: Major flow voids are preserved, including the deep veins and basilar. Skull and upper cervical spine: No marrow lesion noted. Sinuses/Orbits: Left cataract resection. MRA HEAD FINDINGS Extremely motion degraded exam. Poorly visualized proximal right P1 segment is attributed to fetal type circulation based on contemporaneous T2 weighted acquisition. There is likely bilateral atherosclerotic irregularity. No proximal occlusion noted. IMPRESSION: 1. Abnormal signal in the lower thalami and right midbrain favor subacute infarct from thalamoperforators or artery of [place]. Would correlate for symptoms/risk factors of Wernicke's, which can also affect these structures. 2. Extremely limited intracranial MRA due to more motion artifact. No proximal occlusion noted. Electronically Signed   By: Monte Fantasia M.D.   On: 07/13/2016 11:05     Magnus Crescenzo M.D on 07/16/2016 at 11:54 AM  Between 7am to 7pm - Pager - [phone]  After 7pm go to [email or web address] - password [facility]  [facility] -  Office   [phone]

## 2016-07-16 NOTE — Care Management Note (Signed)
Case Management Note  Patient Details  Name: Alexis [name] MRN: [id number] Date of Birth: Sep 26, 1933  Subjective/Objective:                    Action/Plan: Plan is for patient to return home with IV antibiotic therapy when medically ready for d/c. She was followed by [facility] for [facility] and IV antibiotics. Santiago Glad with [facility] aware of admission and following. CM continuing to follow for d/c needs.  Expected Discharge Date:   (Pending)               Expected Discharge Plan:  [place]  In-House Referral:     Discharge planning Services  CM Consult  Post Acute Care Choice:    Choice offered to:  Adult Children  DME Arranged:    DME Agency:     HH Arranged:    HH Agency:     Status of Service:  In process, will continue to follow  If discussed at Long Length of Stay Meetings, dates discussed:    Additional Comments:  Pollie Friar, RN 07/16/2016, 4:00 PM

## 2016-07-17 LAB — VAS US CAROTID
LCCAPDIAS: 10 cm/s
LCCAPSYS: 51 cm/s
LEFT ECA DIAS: -4 cm/s
LEFT VERTEBRAL DIAS: -5 cm/s
LICADDIAS: -15 cm/s
LICAPDIAS: -17 cm/s
LICAPSYS: -69 cm/s
Left CCA dist dias: 9 cm/s
Left CCA dist sys: 68 cm/s
Left ICA dist sys: -58 cm/s
RCCAPSYS: 117 cm/s
RIGHT ECA DIAS: -3 cm/s
RIGHT VERTEBRAL DIAS: 19 cm/s
Right CCA prox dias: 16 cm/s
Right cca dist sys: -54 cm/s

## 2016-07-17 LAB — CULTURE, BLOOD (ROUTINE X 2): Culture: NO GROWTH

## 2016-07-17 LAB — CBC
HCT: 28.5 % — ABNORMAL LOW (ref 36.0–46.0)
Hemoglobin: 9.4 g/dL — ABNORMAL LOW (ref 12.0–15.0)
MCH: 26.9 pg (ref 26.0–34.0)
MCHC: 33 g/dL (ref 30.0–36.0)
MCV: 81.7 fL (ref 78.0–100.0)
PLATELETS: 247 10*3/uL (ref 150–400)
RBC: 3.49 MIL/uL — ABNORMAL LOW (ref 3.87–5.11)
RDW: 14.6 % (ref 11.5–15.5)
WBC: 7.8 10*3/uL (ref 4.0–10.5)

## 2016-07-17 LAB — BASIC METABOLIC PANEL
Anion gap: 8 (ref 5–15)
BUN: 22 mg/dL — AB (ref 6–20)
CALCIUM: 8.5 mg/dL — AB (ref 8.9–10.3)
CO2: 21 mmol/L — AB (ref 22–32)
CREATININE: 1.31 mg/dL — AB (ref 0.44–1.00)
Chloride: 105 mmol/L (ref 101–111)
GFR calc Af Amer: 43 mL/min — ABNORMAL LOW (ref >60)
GFR, EST NON AFRICAN AMERICAN: 37 mL/min — AB (ref >60)
Glucose, Bld: 92 mg/dL (ref 65–99)
Potassium: 4.1 mmol/L (ref 3.5–5.1)
Sodium: 134 mmol/L — ABNORMAL LOW (ref 135–145)

## 2016-07-17 LAB — PROTIME-INR
INR: 1.97
Prothrombin Time: 22.7 seconds — ABNORMAL HIGH (ref 11.4–15.2)

## 2016-07-17 LAB — GENTAMICIN LEVEL, PEAK: Gentamicin Pk: 3.8 ug/mL — ABNORMAL LOW (ref 5.0–10.0)

## 2016-07-17 LAB — GENTAMICIN LEVEL, TROUGH: Gentamicin Trough: 0.5 ug/mL (ref 0.5–2.0)

## 2016-07-17 LAB — VITAMIN B1: Vitamin B1 (Thiamine): 139.3 nmol/L (ref 66.5–200.0)

## 2016-07-17 MED ORDER — GENTAMICIN IN SALINE 1.2-0.9 MG/ML-% IV SOLN
60.0000 mg | INTRAVENOUS | Status: DC
  Administered 2016-07-17: 60 mg via INTRAVENOUS
  Filled 2016-07-17 (×2): qty 50

## 2016-07-17 MED ORDER — THIAMINE HCL 100 MG/ML IJ SOLN: 100.0000 mg | Freq: Every day | INTRAMUSCULAR | Status: DC

## 2016-07-17 MED ORDER — VITAMIN B-1 100 MG PO TABS
100.0000 mg | ORAL_TABLET | Freq: Every day | ORAL | Status: DC
  Administered 2016-07-18: 100 mg via ORAL
  Filled 2016-07-17: qty 1

## 2016-07-17 MED ORDER — WARFARIN SODIUM 3 MG PO TABS
3.0000 mg | ORAL_TABLET | Freq: Once | ORAL | Status: AC
  Administered 2016-07-17: 3 mg via ORAL
  Filled 2016-07-17: qty 1

## 2016-07-17 NOTE — Progress Notes (Signed)
PROGRESS NOTE                                                                                                                                                                                                             Patient Demographics:    Alexis [name], is a 83 y.o. female, DOB - 1933-07-09, PQD:[id number]  Admit date - 07/12/2016   Admitting Physician Albertine Patricia, MD  Outpatient Primary MD for the patient is [name], MD  LOS - 4 Chief Complaint  Patient presents with  . Weakness    Pt was recently tx and discharged for endocarditis comes in today for weakness       Brief Narrative   83 y.o. female, with medical history significant of atrial fibrillation, CAD, CVA, mitral valve replacement, CABG recently admitted for strep bacteremia with mitral valve endocarditis, another admission secondary to reaction to Rocephin that was changed to ampicillin and gentamicin, was just discharged 07/09/2016, He presents with altered mental status and slurred speech, admitted for further workup, EEG was significant for seizures, MRI/MRA brain was poor quality, Had to be repeated with findings significant for DWI hyperintensity with isodense ADC map in the bilateral inferior thalamus and right upper midbrain  Subjective:    Use translator line to speak with patient.  She report lower extremities weakness, not worse.  She report generalized pain.  She is alert, answer questions. Oriented to place , person.  MS improved.    Assessment  & Plan :    Active Problems:   Congestive heart failure (HCC)   Bacteremia due to Streptococcus   Atrial fibrillation (HCC)   Streptococcal endocarditis   Mitral valve replaced   Adjustment disorder with mixed anxiety and depressed mood   TIA (transient ischemic attack)   Acute encephalopathy   Abnormal brain MRI   Seizures (HCC)   Cerebrovascular disease   Acute Encephalopathy - An episode of  confusion, slurred speech, generalized weakness, and worsening visual acuity , she is afebrile, no evidence of active infection, negative urinalysis, lactic acid within normal limits, his x-ray with no evidence of acute cardiopulmonary disease. - Neurology input greatly appreciated,EEG suspicious of seizures, as well as possible Wernicke's encephalopathy  - As well patient with low B-12 level, she is started on supplement.  -improved.   Seizures - D/W neurology , based  on EEG findings, continue with Keppra 500 mg oral twice a day - Patient is on penicillin for endocarditis which lowers her seizure threshold, see discussion below regarding antibiotics  Wernicke encephalopathy - Dr  Emeline Gins discussed with the neurology, MRI findings suspicious for Wernicke encephalopathy, thiamin level 139.  -Patient was started on IV thiamine. Supposed to get last dose IV today. Levels came back to 139. Discussed with Dr Shon Hale, ok to change to oral.    Streptococcus viridans bacteremia with mitral valve endocarditis - She is treated with IV antibiotic regimen including IV ampicillin and IV gentamicin through PICC line(antibiotic regimen recently change secondary to reaction to Rocephin) - Given new onset seizures, possibly related to beta-lactam antibiotics, Dr Emeline Gins discussed with ID Dr Tommy Medal regarding antibiotic regimen, stable to be either vancomycin and gentamicin or daptomycin and gentamicin, given fear of significant nephrotoxicity from vancomycin and gentamicin.  -Antibiotics changed to daptomycin and gentamicin to finish her total 6 weeks course (stop date March 12)  Hypertension - Blood pressure acceptable, continue with home medication  Atrial fibrillation - CHA2DS2VASC score 6 - Continue bisoprolol for rate control. - On warfarin, pharmacy to dose  CKD stage III - At baseline, continue to monitor  Hyperlipidemia - Continue with Lipitor    Code Status : Full  Family  Communication  : no family at bedside.   Disposition Plan  : discharge 2-22 and need to arrange resumption of HH   Consults  :  neurology  Procedures  : none  DVT Prophylaxis  :  On warfarin  Lab Results  Component Value Date   PLT 247 07/17/2016    Antibiotics  :    Anti-infectives    Start     Dose/Rate Route Frequency Ordered Stop   07/14/16 1800  penicillin G potassium 6 Million Units in dextrose 5 % 500 mL continuous infusion  Status:  Discontinued     6 Million Units 41.7 mL/hr over 12 Hours Intravenous Every 12 hours 07/14/16 1304 07/14/16 1325   07/14/16 1600  DAPTOmycin (CUBICIN) 500 mg in sodium chloride 0.9 % IVPB     500 mg 220 mL/hr over 30 Minutes Intravenous Every 24 hours 07/14/16 1337     07/13/16 2000  vancomycin (VANCOCIN) IVPB 750 mg/150 ml premix  Status:  Discontinued     750 mg 150 mL/hr over 60 Minutes Intravenous Every 24 hours 07/13/16 1931 07/14/16 1325   07/13/16 1400  penicillin G potassium 6 Million Units in dextrose 5 % 500 mL continuous infusion  Status:  Discontinued     6 Million Units 41.7 mL/hr over 12 Hours Intravenous Every 12 hours 07/13/16 0805 07/14/16 1304   07/13/16 0800  gentamicin (GARAMYCIN) 60 mg in dextrose 5 % 100 mL IVPB  Status:  Discontinued     60 mg 101.5 mL/hr over 60 Minutes Intravenous Every 24 hours 07/12/16 1859 07/12/16 1902   07/12/16 2000  penicillin G potassium 6 Million Units in dextrose 5 % 500 mL continuous infusion  Status:  Discontinued     6 Million Units 41.7 mL/hr over 12 Hours Intravenous Every 12 hours 07/12/16 1859 07/13/16 0805   07/12/16 2000  gentamicin (GARAMYCIN) 60 mg in dextrose 5 % 100 mL IVPB     60 mg 101.5 mL/hr over 60 Minutes Intravenous Every 24 hours 07/12/16 1902     07/12/16 1830  gentamicin (GARAMYCIN) IVPB  Status:  Discontinued    Comments:  Indication: 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Last Day of Therapy:  08/05/16 Labs - 'Sunday/Monday:  CBC/D, BMP, and gentamicin trough. Labs -  Thursday:  BMP and gentamicin trough Labs - Every other week:  ESR and CRP     60 mg Intravenous Every 24 hours 07/12/16 1825 07/12/16 1859   07/12/16 1830  penicillin G potassium IVPB  Status:  Discontinued    Comments:  as continuous infusion. Indication: 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Last Day of Therapy:  08/05/16 Labs - Once weekly:  CBC/D and BMP, Labs - Every other week:  ESR and CRP     12 Million Units Intravenous Daily 07/12/16 1825 07/12/16 1859        Objective:   Vitals:   07/16/16 2133 07/17/16 0124 07/17/16 0527 07/17/16 0745  BP: (!) 113/50 (!) 114/52 118/72 122/67  Pulse: 69 78 68 86  Resp: 20 20 20 20  Temp: 98.2 F (36.8 C) 98.2 F (36.8 C) 98.2 F (36.8 C) 98.2 F (36.8 C)  TempSrc: Oral Oral Oral Oral  SpO2: 100% 100% 96% 99%  Weight:      Height:        Wt Readings from Last 3 Encounters:  07/12/16 65.8 kg (145 lb)  07/09/16 71.4 kg (157 lb 4.8 oz)  06/28/16 69.4 kg (153 lb)     Intake/Output Summary (Last 24 hours) at 07/17/16 0943 Last data filed at 07/17/16 0800  Gross per 24 hour  Intake             1050 ml  Output              250 ml  Net              80' 0 ml     Physical Exam  Sleeping comfortably  Supple Neck,No JVD,  Symmetrical Chest wall movement, Good air movement bilaterally, CTAB RRR,No Gallops,Rubs or new Murmurs, No Parasternal Heave +ve B.Sounds, Abd Soft, No tenderness, No rebound - guarding or rigidity. No Cyanosis, Clubbing or edema, No new Rash or bruise      Data Review:    CBC  Recent Labs Lab 07/12/16 1326 07/12/16 1336 07/14/16 0439 07/15/16 0427 07/17/16 0428  WBC 7.6  --  7.0 7.2 7.8  HGB 10.2* 10.2* 9.8* 9.8* 9.4*  HCT 31.2* 30.0* 30.2* 30.2* 28.5*  PLT 218  --  235 234 247  MCV 83.2  --  82.7 81.8 81.7  MCH 27.2  --  26.8 26.6 26.9  MCHC 32.7  --  32.5 32.5 33.0  RDW 15.5  --  15.1 14.7 14.6  LYMPHSABS 0.7  --   --   --   --   MONOABS 0.8  --   --   --   --   EOSABS 0.1  --   --   --   --     BASOSABS 0.0  --   --   --   --     Chemistries   Recent Labs Lab 07/12/16 1326 07/12/16 1336 07/14/16 0439 07/15/16 0427 07/17/16 0428  NA 137 138 133* 135 134*  K 4.3 4.3 4.0 4.0 4.1  CL 105 104 100* 103 105  CO2 23  --  23 23 21*  GLUCOSE 101* 101* 109* 93 92  BUN '12 13 13 15 ' 22*  CREATININE 1.20* 1.30* 1.16* 1.19* 1.31*  CALCIUM 9.1  --  8.7* 8.8* 8.5*  AST 23  --   --   --   --   ALT 17  --   --   --   --  ALKPHOS 65  --   --   --   --   BILITOT 0.9  --   --   --   --    ------------------------------------------------------------------------------------------------------------------ No results for input(s): CHOL, HDL, LDLCALC, TRIG, CHOLHDL, LDLDIRECT in the last 72 hours.  Lab Results  Component Value Date   HGBA1C 5.8 (H) 07/13/2016   ------------------------------------------------------------------------------------------------------------------ No results for input(s): TSH, T4TOTAL, T3FREE, THYROIDAB in the last 72 hours.  Invalid input(s): FREET3 ------------------------------------------------------------------------------------------------------------------  Recent Labs  07/14/16 1318  VITAMINB12 198    Coagulation profile  Recent Labs Lab 07/13/16 0510 07/14/16 0439 07/15/16 0427 07/16/16 0401 07/17/16 0428  INR 2.26 2.28 2.07 1.94 1.97    No results for input(s): DDIMER in the last 72 hours.  Cardiac Enzymes No results for input(s): CKMB, TROPONINI, MYOGLOBIN in the last 168 hours.  Invalid input(s): CK ------------------------------------------------------------------------------------------------------------------ No results found for: BNP  Inpatient Medications  Scheduled Meds: . bisoprolol  2.5 mg Oral Daily  . cholecalciferol  2,000 Units Oral Daily  . cyanocobalamin  1,000 mcg Intramuscular Daily  . DAPTOmycin (CUBICIN)  IV  500 mg Intravenous Q24H  . feeding supplement (ENSURE ENLIVE)  237 mL Oral Q24H  . gentamicin   60 mg Intravenous Q24H  . levETIRAcetam  500 mg Oral BID  . loratadine  10 mg Oral Daily  . multivitamin with minerals  1 tablet Oral Daily  . ramipril  2.5 mg Oral Daily  . thiamine injection  500 mg Intravenous TID  . [START ON 07/18/2016] vitamin B-12  500 mcg Oral Daily  . warfarin  3 mg Oral ONCE-1800  . Warfarin - Pharmacist Dosing Inpatient   Does not apply [phone]    Micro Results Recent Results (from the past 240 hour(s))  Culture, blood (routine x 2)     Status: None   Collection Time: 07/08/16 11:40 AM  Result Value Ref Range Status   Specimen Description BLOOD LEFT ASSIST CONTROL  Final   Special Requests BOTTLES DRAWN AEROBIC AND ANAEROBIC 5CC EACH  Final   Culture NO GROWTH 5 DAYS  Final   Report Status 07/13/2016 FINAL  Final  Culture, blood (routine x 2)     Status: None   Collection Time: 07/08/16 11:55 AM  Result Value Ref Range Status   Specimen Description BLOOD LEFT HAND  Final   Special Requests BOTTLES DRAWN AEROBIC ONLY 5CC  Final   Culture NO GROWTH 5 DAYS  Final   Report Status 07/13/2016 FINAL  Final  Blood Culture (routine x 2)     Status: Abnormal   Collection Time: 07/12/16  1:29 PM  Result Value Ref Range Status   Specimen Description BLOOD LEFT ANTECUBITAL  Final   Special Requests BOTTLES DRAWN AEROBIC AND ANAEROBIC 5CC  Final   Culture  Setup Time   Final    GRAM POSITIVE COCCI IN CLUSTERS ANAEROBIC BOTTLE ONLY CRITICAL RESULT CALLED TO, READ BACK BY AND VERIFIED WITH: T STONE,PHARMD AT 1920 07/13/16 BY L BENFIELD    Culture (A)  Final    STAPHYLOCOCCUS SPECIES (COAGULASE NEGATIVE) THE SIGNIFICANCE OF ISOLATING THIS ORGANISM FROM A SINGLE SET OF BLOOD CULTURES WHEN MULTIPLE SETS ARE DRAWN IS UNCERTAIN. PLEASE NOTIFY THE MICROBIOLOGY DEPARTMENT WITHIN ONE WEEK IF SPECIATION AND SENSITIVITIES ARE REQUIRED.    Report Status 07/15/2016 FINAL  Final  Blood Culture ID Panel (Reflexed)     Status: Abnormal   Collection Time: 07/12/16  1:29 PM  Result  Value Ref Range Status  Enterococcus species NOT DETECTED NOT DETECTED Final   Listeria monocytogenes NOT DETECTED NOT DETECTED Final   Staphylococcus species DETECTED (A) NOT DETECTED Final    Comment: Methicillin (oxacillin) resistant coagulase negative staphylococcus. Possible blood culture contaminant (unless isolated from more than one blood culture draw or clinical case suggests pathogenicity). No antibiotic treatment is indicated for blood  culture contaminants. CRITICAL RESULT CALLED TO, READ BACK BY AND VERIFIED WITH: T STONE,PHARMD AT 1920 07/13/16 BY L BENFIELD    Staphylococcus aureus NOT DETECTED NOT DETECTED Final   Methicillin resistance DETECTED (A) NOT DETECTED Final    Comment: CRITICAL RESULT CALLED TO, READ BACK BY AND VERIFIED WITH: T STONE,PHARMD AT 1920 07/13/16 BY L BENFIELD    Streptococcus species NOT DETECTED NOT DETECTED Final   Streptococcus agalactiae NOT DETECTED NOT DETECTED Final   Streptococcus pneumoniae NOT DETECTED NOT DETECTED Final   Streptococcus pyogenes NOT DETECTED NOT DETECTED Final   Acinetobacter baumannii NOT DETECTED NOT DETECTED Final   Enterobacteriaceae species NOT DETECTED NOT DETECTED Final   Enterobacter cloacae complex NOT DETECTED NOT DETECTED Final   Escherichia coli NOT DETECTED NOT DETECTED Final   Klebsiella oxytoca NOT DETECTED NOT DETECTED Final   Klebsiella pneumoniae NOT DETECTED NOT DETECTED Final   Proteus species NOT DETECTED NOT DETECTED Final   Serratia marcescens NOT DETECTED NOT DETECTED Final   Haemophilus influenzae NOT DETECTED NOT DETECTED Final   Neisseria meningitidis NOT DETECTED NOT DETECTED Final   Pseudomonas aeruginosa NOT DETECTED NOT DETECTED Final   Candida albicans NOT DETECTED NOT DETECTED Final   Candida glabrata NOT DETECTED NOT DETECTED Final   Candida krusei NOT DETECTED NOT DETECTED Final   Candida parapsilosis NOT DETECTED NOT DETECTED Final   Candida tropicalis NOT DETECTED NOT DETECTED Final   Blood Culture (routine x 2)     Status: None (Preliminary result)   Collection Time: 07/12/16  2:15 PM  Result Value Ref Range Status   Specimen Description BLOOD BLOOD LEFT FOREARM  Final   Special Requests AEROBIC BOTTLE ONLY 5CC  Final   Culture NO GROWTH 4 DAYS  Final   Report Status PENDING  Incomplete  Urine culture     Status: None   Collection Time: 07/12/16  3:13 PM  Result Value Ref Range Status   Specimen Description URINE, RANDOM  Final   Special Requests NONE  Final   Culture NO GROWTH  Final   Report Status 07/13/2016 FINAL  Final    Radiology Reports Dg Chest 2 View  Result Date: 07/12/2016 CLINICAL DATA:  Sepsis, generalized weakness, recent flu symptoms, history hypertension, atrial fibrillation, stroke, coronary artery disease EXAM: CHEST  2 VIEW COMPARISON:  07/08/2016 FINDINGS: RIGHT arm PICC line tip projects over SVC. Minimal enlargement of cardiac silhouette post CABG, MVR, and AVR. Atherosclerotic calcification aorta. Mild pulmonary vascular congestion. Bibasilar atelectasis. Lungs otherwise clear. No pleural effusion or pneumothorax. Osseous demineralization. IMPRESSION: Minimal enlargement of cardiac silhouette post CABG, AVR and MVR. Aortic atherosclerosis. Bibasilar atelectasis. Electronically Signed   By: Lavonia Dana M.D.   On: 07/12/2016 13:58   Ct Head Wo Contrast  Result Date: 07/12/2016 CLINICAL DATA:  Confusion, lethargy, body weakness and slurred speech beginning today, history stroke, coronary artery disease post CABG, atrial fibrillation, hypertension EXAM: CT HEAD WITHOUT CONTRAST TECHNIQUE: Contiguous axial images were obtained from the base of the skull through the vertex without intravenous contrast. COMPARISON:  None FINDINGS: Brain: Generalized atrophy. Normal ventricular morphology. No midline shift or mass effect. Mild small  vessel chronic ischemic changes of deep cerebral white matter. No intracranial hemorrhage, mass lesion or evidence acute  infarction. No extra-axial fluid collections. Vascular: Extensive atherosclerotic calcification of internal carotid and vertebrobasilar arteries at skullbase. Skull: Intact Sinuses/Orbits: Clear Other: N/A IMPRESSION: Atrophy with mild small vessel chronic ischemic changes of deep cerebral white matter. No acute intracranial abnormalities. Electronically Signed   By: Lavonia Dana M.D.   On: 07/12/2016 16:20   Mr Jodene Nam Head Wo Contrast  Result Date: 07/14/2016 CLINICAL DATA:  Altered mental status. EXAM: MRI HEAD WITHOUT CONTRAST MRA HEAD WITHOUT CONTRAST TECHNIQUE: Multiplanar, multiecho pulse sequences of the brain and surrounding structures were obtained without intravenous contrast. Angiographic images of the head were obtained using MRA technique without contrast. COMPARISON:  Same study from yesterday FINDINGS: MRI HEAD FINDINGS Brain: Unchanged DWI hyperintensity with isodense ADC map in the bilateral inferior thalamus and right upper midbrain. No new abnormality to suggest acute infarct, hemorrhage, hydrocephalus, or mass effect. Extensive motion degradation in this altercation. Subtle findings could be obscured. Vascular: Arterial findings below. Unremarkable dural venous sinuses and deep venous flow voids. Skull and upper cervical spine: Negative Sinuses/Orbits: Cataract resection on the left. MRA HEAD FINDINGS The carotid and vertebral arteries are distorted by motion but are widely patent. Non degraded basilar has a smooth widely patent appearance. There is a large posterior communicating artery on the right with fetal type PCA anatomy. On source images there is suggestion of a top basilar infundibulum from hypoplastic P1 or perforator, relationship to the thalamus/midbrain findings uncertain as the associated vessel is below the resolution of MRA. Mild for age atherosclerotic irregularity of bilateral MCA branches. No major branch occlusion. Negative for aneurysm. IMPRESSION: 1. Motion degraded brain  MRI with stable findings in the bilateral thalamus and right midbrain. No revision to prior differential considerations. 2. No structural explanation for partial seizures from the left temporal lobe. 3. Motion degraded but better quality MRA. No major vessel occlusion or flow limiting stenosis. Electronically Signed   By: Monte Fantasia M.D.   On: 07/14/2016 13:06   Mr Brain Wo Contrast  Result Date: 07/14/2016 CLINICAL DATA:  Altered mental status. EXAM: MRI HEAD WITHOUT CONTRAST MRA HEAD WITHOUT CONTRAST TECHNIQUE: Multiplanar, multiecho pulse sequences of the brain and surrounding structures were obtained without intravenous contrast. Angiographic images of the head were obtained using MRA technique without contrast. COMPARISON:  Same study from yesterday FINDINGS: MRI HEAD FINDINGS Brain: Unchanged DWI hyperintensity with isodense ADC map in the bilateral inferior thalamus and right upper midbrain. No new abnormality to suggest acute infarct, hemorrhage, hydrocephalus, or mass effect. Extensive motion degradation in this altercation. Subtle findings could be obscured. Vascular: Arterial findings below. Unremarkable dural venous sinuses and deep venous flow voids. Skull and upper cervical spine: Negative Sinuses/Orbits: Cataract resection on the left. MRA HEAD FINDINGS The carotid and vertebral arteries are distorted by motion but are widely patent. Non degraded basilar has a smooth widely patent appearance. There is a large posterior communicating artery on the right with fetal type PCA anatomy. On source images there is suggestion of a top basilar infundibulum from hypoplastic P1 or perforator, relationship to the thalamus/midbrain findings uncertain as the associated vessel is below the resolution of MRA. Mild for age atherosclerotic irregularity of bilateral MCA branches. No major branch occlusion. Negative for aneurysm. IMPRESSION: 1. Motion degraded brain MRI with stable findings in the bilateral  thalamus and right midbrain. No revision to prior differential considerations. 2. No structural explanation for partial seizures  from the left temporal lobe. 3. Motion degraded but better quality MRA. No major vessel occlusion or flow limiting stenosis. Electronically Signed   By: Monte Fantasia M.D.   On: 07/14/2016 13:06   Mr Brain Wo Contrast  Result Date: 07/13/2016 CLINICAL DATA:  Generalized weakness. Altered mental status. Recent mitral valve endocarditis. EXAM: MRI HEAD WITHOUT CONTRAST MRA HEAD WITHOUT CONTRAST TECHNIQUE: Multiplanar, multiecho pulse sequences of the brain and surrounding structures were obtained without intravenous contrast. Angiographic images of the head were obtained using MRA technique without contrast. COMPARISON:  Yesterday FINDINGS: MRI HEAD FINDINGS Brain: Hyperintense DWI signal along the lower thalamus bilaterally extending into the upper right midbrain right of the cerebral aquaduct. These areas are isointense on ADC map. The deep gray nuclei corpus callosum have normal signal and appearance. The mamillary bodies are not well covered due to slice selection and motion artifact. Mild signal abnormality in the cerebral white matter and dorsal pons, likely chronic microvascular ischemia. Normal brain volume. No hemorrhage or hydrocephalus. Negative for mass. Vascular: Major flow voids are preserved, including the deep veins and basilar. Skull and upper cervical spine: No marrow lesion noted. Sinuses/Orbits: Left cataract resection. MRA HEAD FINDINGS Extremely motion degraded exam. Poorly visualized proximal right P1 segment is attributed to fetal type circulation based on contemporaneous T2 weighted acquisition. There is likely bilateral atherosclerotic irregularity. No proximal occlusion noted. IMPRESSION: 1. Abnormal signal in the lower thalami and right midbrain favor subacute infarct from thalamoperforators or artery of [place]. Would correlate for symptoms/risk factors  of Wernicke's, which can also affect these structures. 2. Extremely limited intracranial MRA due to more motion artifact. No proximal occlusion noted. Electronically Signed   By: Monte Fantasia M.D.   On: 07/13/2016 11:05   Dg Chest Port 1 View  Result Date: 06/28/2016 CLINICAL DATA:  Central line placement. EXAM: PORTABLE CHEST 1 VIEW COMPARISON:  None. FINDINGS: The patient's right PICC is noted ending about the mid SVC. Mild vascular congestion is noted. Mild peribronchial thickening is seen. Minimal bilateral atelectasis is seen. No pleural effusion or pneumothorax is identified. The cardiomediastinal silhouette is mildly enlarged. The patient is status post median sternotomy. No acute osseous abnormalities are identified. IMPRESSION: 1. Right PICC noted ending about the mid SVC. 2. Mild vascular congestion and mild cardiomegaly. Peribronchial thickening seen. Minimal bilateral atelectasis noted. Electronically Signed   By: Garald Balding M.D.   On: 06/28/2016 16:48   Dg Abdomen Acute W/chest  Result Date: 07/08/2016 CLINICAL DATA:  Epigastric pain tonight. EXAM: DG ABDOMEN ACUTE W/ 1V CHEST COMPARISON:  Chest 06/28/2016 FINDINGS: Postoperative changes in the mediastinum. Right PICC line with tip over the low SVC region. Shallow inspiration. Heart size and pulmonary vascularity are normal. Linear scarring or atelectasis in the mid lungs, similar to prior study. Calcified and tortuous aorta. Prominent upper abdominal vascular calcifications. Scattered gas and stool throughout the colon. No small or large bowel distention. No free intra- abdominal air. No abnormal air-fluid levels. Calcifications in the pelvis consistent with fibroids. Vascular calcifications in the aorta and iliac arteries. Degenerative changes in the spine and hips. IMPRESSION: No evidence of active pulmonary disease. Nonobstructive bowel gas pattern. Vascular calcifications. Calcified fibroids. Electronically Signed   By: Lucienne Capers M.D.   On: 07/08/2016 06:03   Mr Jodene Nam Head/brain HR Cm  Result Date: 07/13/2016 CLINICAL DATA:  Generalized weakness. Altered mental status. Recent mitral valve endocarditis. EXAM: MRI HEAD WITHOUT CONTRAST MRA HEAD WITHOUT CONTRAST TECHNIQUE: Multiplanar, multiecho pulse sequences of  the brain and surrounding structures were obtained without intravenous contrast. Angiographic images of the head were obtained using MRA technique without contrast. COMPARISON:  Yesterday FINDINGS: MRI HEAD FINDINGS Brain: Hyperintense DWI signal along the lower thalamus bilaterally extending into the upper right midbrain right of the cerebral aquaduct. These areas are isointense on ADC map. The deep gray nuclei corpus callosum have normal signal and appearance. The mamillary bodies are not well covered due to slice selection and motion artifact. Mild signal abnormality in the cerebral white matter and dorsal pons, likely chronic microvascular ischemia. Normal brain volume. No hemorrhage or hydrocephalus. Negative for mass. Vascular: Major flow voids are preserved, including the deep veins and basilar. Skull and upper cervical spine: No marrow lesion noted. Sinuses/Orbits: Left cataract resection. MRA HEAD FINDINGS Extremely motion degraded exam. Poorly visualized proximal right P1 segment is attributed to fetal type circulation based on contemporaneous T2 weighted acquisition. There is likely bilateral atherosclerotic irregularity. No proximal occlusion noted. IMPRESSION: 1. Abnormal signal in the lower thalami and right midbrain favor subacute infarct from thalamoperforators or artery of Percheron. Would correlate for symptoms/risk factors of Wernicke's, which can also affect these structures. 2. Extremely limited intracranial MRA due to more motion artifact. No proximal occlusion noted. Electronically Signed   By: Monte Fantasia M.D.   On: 07/13/2016 11:05     Shakeda Pearse A M.D on 07/17/2016 at 9:43 AM  Between  7am to 7pm - Pager - [phone]  After 7pm go to [email or web address] - password [facility]

## 2016-07-17 NOTE — Progress Notes (Signed)
[place] for warfarin Indication: atrial fibrillation  Allergies  Allergen Reactions  . Ceftriaxone Other (See Comments)    Per MD progress note 2/12:  Infusion reaction.  Denies other associated symptoms such as diaphoresis, nausea, vomiting, dysuria, diarrhea, lower abdominal pain, received palpitations, swelling, difficulty breathing  . Hydralazine Hcl Other (See Comments)    Headache     Patient Measurements: Height: 5' (152.4 cm) Weight: 145 lb (65.8 kg) IBW/kg (Calculated) : 45.5  Vital Signs: Temp: 98.2 F (36.8 C) (02/21 0745) Temp Source: Oral (02/21 0745) BP: 122/67 (02/21 0745) Pulse Rate: 86 (02/21 0745)  Labs:  Recent Labs  07/15/16 0427 07/16/16 0401 07/17/16 0428  HGB 9.8*  --  9.4*  HCT 30.2*  --  28.5*  PLT 234  --  247  LABPROT 23.7* 22.5* 22.7*  INR 2.07 1.94 1.97  CREATININE 1.19*  --  1.31*  CKTOTAL 34*  --   --     Estimated Creatinine Clearance: 29 mL/min (by C-G formula based on SCr of 1.31 mg/dL (H)).     Assessment: 55 yoF on warfarin PTA for atrial fibrillation/mitral valve repair presents with somnolence and difficult arousing. Pt is to resume warfarin per pharmacy. INR = 2.28>2.07>1.94>1.97. Will give another booster dose today and likely resume home dose tomorrow.  PTA Warfarin Dose = 2.5mg  Mon/Wed/Fri, 1.25mg  Tues/Thurs/Sat/Sun  Goal of Therapy:  INR 2-3 Monitor platelets by anticoagulation protocol: Yes   Plan:  Warfarin 3mg  tonight x1 Monitor INR, S/Sx bleeding daily   Andrey Cota. Diona Foley, PharmD, [place] Clinical Pharmacist [phone] 07/17/2016

## 2016-07-17 NOTE — Progress Notes (Signed)
Pharmacy Antibiotic Note  Alexis [name] is a 83 y.o. female admitted on 07/12/2016 with endocarditis. Patient continues on daptomycin and gentamicin for synergy. SCr slightly elevated but stable.   Gentamicin trough 0.5 mcg/ml (Drawn 30 min prior to dose) Goal: <1 mcg/ml Gentamicin peak 3.8 mcg/ml (Drawn 30 min after end of infusion) Goal: 3-4 mcg/ml  Peak and trough are therapeutic on 60mg  IV q24h Calculated Ke 0.09, half-life 7.7 hrs  Plan: Continue Daptomycin 500mg  IV 24h Continue Gentamicin 60mg  IV q24h CK weekly on Monday Will monitor renal function closely.  Height: 5' (152.4 cm) Weight: 145 lb (65.8 kg) IBW/kg (Calculated) : 45.5  Temp (24hrs), Avg:98.2 F (36.8 C), Min:97.8 F (36.6 C), Max:98.6 F (37 C)   Recent Labs Lab 07/12/16 1326 07/12/16 1336 07/12/16 1337 07/14/16 0439 07/15/16 0427 07/17/16 0428 07/17/16 1940 07/17/16 2115  WBC 7.6  --   --  7.0 7.2 7.8  --   --   CREATININE 1.20* 1.30*  --  1.16* 1.19* 1.31*  --   --   LATICACIDVEN  --   --  0.93  --   --   --   --   --   GENTTROUGH  --   --   --   --   --   --  0.5  --   GENTPEAK  --   --   --   --   --   --   --  3.8*    Estimated Creatinine Clearance: 29 mL/min (by C-G formula based on SCr of 1.31 mg/dL (H)).    Allergies  Allergen Reactions  . Ceftriaxone Other (See Comments)    Per MD progress note 2/12:  Infusion reaction.  Denies other associated symptoms such as diaphoresis, nausea, vomiting, dysuria, diarrhea, lower abdominal pain, received palpitations, swelling, difficulty breathing  . Hydralazine Hcl Other (See Comments)    Headache     Andrey Cota. Diona Foley, PharmD, BCPS Clinical Pharmacist [phone] 07/17/2016 11:12 PM

## 2016-07-17 NOTE — Progress Notes (Signed)
Pt daughter races to nurses station stating, "my mom needs help!'' "she is having the same symptoms now (generalized pain manifested by anxiety) that she was having when I brought her to the ED.  I reassessed the patient, vitals were stable, NIH unchanged, called Dr. Nicole Kindred no new orders were given other than to continue to access patient.  I explained this to the daughter who was now very anxious and feeling that we were not helping.  I got an incentive spirometer, gave to the mother to practice deep breathing exercises.  I thought this distraction technique might help alleviate anxiety.  Within a few minutes of daughter translating back and forth between me and the patient, patient was axo 4, pain had relieved itself completely, and I educated daughter about anxiety.  Explained to the daughter why this episode was different then what she was originally admitted for.  Patient had stable vitals, axo 4, NIH 0, no drug current drug allergy/interactions occurring at this time.  Attempted to educate daughter about anxiety and how tips and tricks on how relaxation, deep breathing, distraction techniques, etc. Can be utilized during these anxiety episodes to help calm patient without medica intervention.

## 2016-07-17 NOTE — Progress Notes (Signed)
Pharmacy Antibiotic Note  Alexis [name] is a 83 y.o. female admitted on 07/12/2016 with endocarditis.  Pharmacy has been consulted for Daptomycin dosing.  Patient continues on daptomycin and gentamicin for synergy. Renal function is slightly elevated but stable. Will check a gentamicin trough this evening to verify patient is clearing.  Plan: Daptomycin 500mg  IV 24h Gentamicin 60mg  IV q24h DC atorvastatin while on daptomycin CK weekly on Monday Will monitor renal function closely. If worsens, may change daptomycin to q48h dosing.  Height: 5' (152.4 cm) Weight: 145 lb (65.8 kg) IBW/kg (Calculated) : 45.5  Temp (24hrs), Avg:98.1 F (36.7 C), Min:97.7 F (36.5 C), Max:98.2 F (36.8 C)   Recent Labs Lab 07/12/16 1326 07/12/16 1336 07/12/16 1337 07/14/16 0439 07/15/16 0427 07/17/16 0428  WBC 7.6  --   --  7.0 7.2 7.8  CREATININE 1.20* 1.30*  --  1.16* 1.19* 1.31*  LATICACIDVEN  --   --  0.93  --   --   --     Estimated Creatinine Clearance: 29 mL/min (by C-G formula based on SCr of 1.31 mg/dL (H)).    Allergies  Allergen Reactions  . Ceftriaxone Other (See Comments)    Per MD progress note 2/12:  Infusion reaction.  Denies other associated symptoms such as diaphoresis, nausea, vomiting, dysuria, diarrhea, lower abdominal pain, received palpitations, swelling, difficulty breathing  . Hydralazine Hcl Other (See Comments)    Headache     Andrey Cota. Diona Foley, PharmD, BCPS Clinical Pharmacist [phone] 07/17/2016 8:26 AM

## 2016-07-18 LAB — BASIC METABOLIC PANEL
Anion gap: 9 (ref 5–15)
BUN: 23 mg/dL — AB (ref 6–20)
CHLORIDE: 106 mmol/L (ref 101–111)
CO2: 21 mmol/L — ABNORMAL LOW (ref 22–32)
CREATININE: 1.17 mg/dL — AB (ref 0.44–1.00)
Calcium: 8.9 mg/dL (ref 8.9–10.3)
GFR, EST AFRICAN AMERICAN: 50 mL/min — AB (ref >60)
GFR, EST NON AFRICAN AMERICAN: 43 mL/min — AB (ref >60)
Glucose, Bld: 90 mg/dL (ref 65–99)
Potassium: 4.5 mmol/L (ref 3.5–5.1)
SODIUM: 136 mmol/L (ref 135–145)

## 2016-07-18 LAB — PROTIME-INR
INR: 2.01
Prothrombin Time: 23.1 seconds — ABNORMAL HIGH (ref 11.4–15.2)

## 2016-07-18 LAB — CBC
HEMATOCRIT: 30.1 % — AB (ref 36.0–46.0)
Hemoglobin: 9.7 g/dL — ABNORMAL LOW (ref 12.0–15.0)
MCH: 26.3 pg (ref 26.0–34.0)
MCHC: 32.2 g/dL (ref 30.0–36.0)
MCV: 81.6 fL (ref 78.0–100.0)
PLATELETS: 270 10*3/uL (ref 150–400)
RBC: 3.69 MIL/uL — ABNORMAL LOW (ref 3.87–5.11)
RDW: 14.5 % (ref 11.5–15.5)
WBC: 8.1 10*3/uL (ref 4.0–10.5)

## 2016-07-18 MED ORDER — WARFARIN SODIUM 5 MG PO TABS
2.5000 mg | ORAL_TABLET | Freq: Once | ORAL | Status: AC
  Administered 2016-07-18: 2.5 mg via ORAL
  Filled 2016-07-18: qty 1

## 2016-07-18 MED ORDER — THIAMINE HCL 100 MG PO TABS: 100.0000 mg | ORAL_TABLET | Freq: Every day | ORAL | 0 refills | Status: DC

## 2016-07-18 MED ORDER — LEVETIRACETAM 500 MG PO TABS: 500.0000 mg | ORAL_TABLET | Freq: Two times a day (BID) | ORAL | 0 refills | Status: DC

## 2016-07-18 MED ORDER — HEPARIN SOD (PORK) LOCK FLUSH 100 UNIT/ML IV SOLN
250.0000 [IU] | INTRAVENOUS | Status: DC | PRN
  Administered 2016-07-18: 250 [IU]

## 2016-07-18 MED ORDER — CYANOCOBALAMIN 1000 MCG PO TABS: 500.0000 ug | ORAL_TABLET | Freq: Every day | ORAL | 0 refills | Status: DC

## 2016-07-18 MED ORDER — HEPARIN SOD (PORK) LOCK FLUSH 100 UNIT/ML IV SOLN
250.0000 [IU] | Freq: Every day | INTRAVENOUS | Status: DC
  Filled 2016-07-18: qty 2.5

## 2016-07-18 MED ORDER — GENTAMICIN IV (FOR PTA / DISCHARGE USE ONLY): 60.0000 mg | INTRAVENOUS | 0 refills | Status: AC

## 2016-07-18 MED ORDER — GENTAMICIN IN SALINE 1.2-0.9 MG/ML-% IV SOLN: 60.0000 mg | INTRAVENOUS | Status: DC

## 2016-07-18 MED ORDER — GENTAMICIN IN SALINE 1.2-0.9 MG/ML-% IV SOLN
60.0000 mg | INTRAVENOUS | Status: DC
  Administered 2016-07-18: 60 mg via INTRAVENOUS
  Filled 2016-07-18: qty 50

## 2016-07-18 MED ORDER — VANCOMYCIN IV (FOR PTA / DISCHARGE USE ONLY): 1000.0000 mg | INTRAVENOUS | 0 refills | Status: AC

## 2016-07-18 MED ORDER — VANCOMYCIN HCL IN DEXTROSE 1-5 GM/200ML-% IV SOLN
1000.0000 mg | INTRAVENOUS | Status: DC
  Administered 2016-07-18: 1000 mg via INTRAVENOUS
  Filled 2016-07-18: qty 200

## 2016-07-18 NOTE — Progress Notes (Signed)
Had conversation with patients daughter about her mother being discharged home tomorrow. Daughter in agreement with discharge but stressed about having to work and keep up with all the paperwork and errands for her mother. Daughter asked about assistance for her mother and that she had called the [employer] number she was provided by CM over the weekend but stated she was directed to legal personnel. CM provided her more information on insurance and some assistance organizations through [facility]. CM inquired if they had attempted to get Medicaid. Daughter states she thought she had to wait 5 years after coming into the county. CM provided her with the number for DSS again so she could follow up with Medicaid.  [employer] also has a PACE type program. Information on this provided to daughter also.  Daughter states she will provide transportation home.  Pam with [facility] IV therapy will be notified of d/c. CM following.

## 2016-07-18 NOTE — Progress Notes (Signed)
Physical Therapy Treatment Patient Details Name: Alexis [name] MRN: [id number] DOB: 1934/01/16 Today's Date: 07/18/2016    History of Present Illness Patient is a 83 year old female history of hypertension, coronary artery disease status post CABG in 2004, CVA, atrial fibrillation on chronic Coumadin. Pt admitted for weakness.    PT Comments    Pt presents with increased SHOB, vitals stable.  Poor balance with gait without AD.  Pt would benefit from RW to improve balance.  Per eval patient has one at home.  Plan top d/c home today with support from daughter.     Follow Up Recommendations  Home health PT;Supervision/Assistance - 24 hour     Equipment Recommendations  None recommended by PT    Recommendations for Other Services       Precautions / Restrictions Precautions Precautions: Fall Restrictions Weight Bearing Restrictions: No    Mobility  Bed Mobility Overal bed mobility: Needs Assistance         Sit to supine: Supervision   General bed mobility comments: Pt sitting edge of bed on arrival.  Pt required cues to scoot to edge of bed and cues for hand placement to achieve optimal position in bed.    Transfers Overall transfer level: Needs assistance Equipment used: None;Rolling walker (2 wheeled) Transfers: Sit to/from Stand   Stand pivot transfers: Min guard       General transfer comment: Slightly off balance intially on standing.  Unable to interpret but c/o of holding stomach.    Ambulation/Gait Ambulation/Gait assistance: Min assist Ambulation Distance (Feet): 150 Feet (without device and 40 feet with RW.    ) Assistive device: None;Rolling walker (2 wheeled) Gait Pattern/deviations: Decreased stride length;Drifts right/left Gait velocity: decreased Gait velocity interpretation: Below normal speed for age/gender General Gait Details: Gait more unsteady during today treatment.  SPO2 99%, HR 80 bpm.  DOE 2/4.  Incorporated RW to improve safety  with gait training.  pt appears more balance with RW.     Stairs     Stair Management: Two rails Number of Stairs: 2 General stair comments: Cues for safety.    Wheelchair Mobility    Modified Rankin (Stroke Patients Only)       Balance Overall balance assessment: Needs assistance   Sitting balance-Leahy Scale: Good         Standing balance comment: FAIR to POOR.                      Cognition Arousal/Alertness: Awake/alert Behavior During Therapy: WFL for tasks assessed/performed Overall Cognitive Status:  (unable to assess, daughter not present and due to language)                      Exercises General Exercises - Lower Extremity Ankle Circles/Pumps: AROM;Both;10 reps;Supine Heel Slides: AROM;Both;10 reps;Supine Hip ABduction/ADduction: AROM;Both;10 reps;Supine Straight Leg Raises: AROM;10 reps;Supine;Both    General Comments        Pertinent Vitals/Pain Pain Assessment: No/denies pain    Home Living                      Prior Function            PT Goals (current goals can now be found in the care plan section) Acute Rehab PT Goals Patient Stated Goal: not stated Potential to Achieve Goals: Good Progress towards PT goals: Progressing toward goals    Frequency    Min 3X/week      PT Plan  Current plan remains appropriate    Co-evaluation             End of Session Equipment Utilized During Treatment: Gait belt Activity Tolerance: Patient tolerated treatment well Patient left: in chair;with call bell/phone within reach;with chair alarm set Nurse Communication: Mobility status       Time: 1450-1518 PT Time Calculation (min) (ACUTE ONLY): 28 min  Charges:  $Gait Training: 8-22 mins $Therapeutic Exercise: 8-22 mins                    G Codes:       Cristela Blue 07-24-16, 3:28 PM Governor Rooks, PTA pager [phone]

## 2016-07-18 NOTE — Progress Notes (Signed)
PHARMACY CONSULT NOTE FOR:  OUTPATIENT  PARENTERAL ANTIBIOTIC THERAPY (OPAT)  Indication: VGS prosthetic valve endocarditis Regimen: vancomycin 1000 mg IV Q24H + gentamicin 60 mg IV Q24H End date: 08/05/16    Thank you for allowing pharmacy to be a part of this patient's care.  Gwenlyn Perking, PharmD PGY1 Pharmacy Resident Pager: [phone] 07/18/2016 11:56 AM

## 2016-07-18 NOTE — Progress Notes (Signed)
Occupational Therapy Treatment Patient Details Name: Alexis [name] MRN: [id number] DOB: 31-Dec-1933 Today's Date: 07/18/2016    History of present illness Patient is a 83 year old female history of hypertension, coronary artery disease status post CABG in 2004, CVA, atrial fibrillation on chronic Coumadin. Pt admitted for weakness.   OT comments  Pt progressing well toward OT goals. Pt remains unstable during functional mobility requiring min guard to min assist throughout standing ADL and toilet transfers. She was able to complete grooming tasks standing at sink with min guard assist and verbal cues for sequencing and problem solving. She presents with weakness in her L UE and reports that she feels as though she will lean to the L while walking. [place] interpreter Engineer, civil (consulting) [phone]) from [employer] utilized via phone. OT will continue to follow while admitted. D/C plan remains appropriate.   Follow Up Recommendations  Home health OT;Supervision/Assistance - 24 hour    Equipment Recommendations  None recommended by OT    Recommendations for Other Services      Precautions / Restrictions Precautions Precautions: Fall Restrictions Weight Bearing Restrictions: No       Mobility Bed Mobility Overal bed mobility: Needs Assistance         Sit to supine: Supervision   General bed mobility comments: OOB in recliner on OT arrival.  Transfers Overall transfer level: Needs assistance Equipment used: None Transfers: Sit to/from Stand Sit to Stand: Min guard Stand pivot transfers: Min guard       General transfer comment: Pt unsteady during standing.     Balance Overall balance assessment: Needs assistance Sitting-balance support: Feet supported Sitting balance-Leahy Scale: Good     Standing balance support: During functional activity;No upper extremity supported Standing balance-Leahy Scale: Fair Standing balance comment: Fair to poor at times. Pt reaching for  single UE support from IV pole.                   ADL Overall ADL's : Needs assistance/impaired     Grooming: Min guard;Oral care;Wash/dry hands;Sitting Grooming Details (indicate cue type and reason): Verbal cues for problem solving through simple ADL tasks.                 Toilet Transfer: Ambulation;Comfort height toilet;Min guard;Minimal assistance           Functional mobility during ADLs: Min guard;Minimal assistance General ADL Comments: Pt reporting feeling that she was falling to the L during mobility. She was able to complete simple ADL tasks but required VC's for sequencing and problem solving.      Vision                     Perception     Praxis      Cognition   Behavior During Therapy: [facility] for tasks assessed/performed Overall Cognitive Status: No family/caregiver present to determine baseline cognitive functioning (Daughter not present at this time) Area of Impairment: Problem solving              Problem Solving: Requires verbal cues;Difficulty sequencing        Exercises General Exercises - Lower Extremity Ankle Circles/Pumps: AROM;Both;10 reps;Supine Heel Slides: AROM;Both;10 reps;Supine Hip ABduction/ADduction: AROM;Both;10 reps;Supine Straight Leg Raises: AROM;10 reps;Supine;Both   Shoulder Instructions       General Comments      Pertinent Vitals/ Pain       Pain Assessment: No/denies pain  Home Living  Prior Functioning/Environment              Frequency  Min 2X/week        Progress Toward Goals  OT Goals(current goals can now be found in the care plan section)  Progress towards OT goals: Progressing toward goals  Acute Rehab OT Goals Patient Stated Goal: not stated OT Goal Formulation: With patient/family Time For Goal Achievement: 07/27/16 Potential to Achieve Goals: Good ADL Goals Pt Will Perform Grooming: with min guard  assist;standing Pt Will Perform Upper Body Bathing: with set-up;sitting Pt Will Perform Lower Body Bathing: with min guard assist;sit to/from stand Pt Will Perform Upper Body Dressing: with supervision;sitting Pt Will Perform Lower Body Dressing: with min guard assist;sit to/from stand Pt Will Transfer to Toilet: with min guard assist;ambulating;regular height toilet;bedside commode;grab bars Pt Will Perform Toileting - Clothing Manipulation and hygiene: with min guard assist;sit to/from stand Pt Will Perform Tub/Shower Transfer: with min guard assist;ambulating;tub bench;shower seat;rolling walker  Plan Discharge plan remains appropriate    Co-evaluation                 End of Session Equipment Utilized During Treatment: Gait belt  OT Visit Diagnosis: Muscle weakness (generalized) (M62.81)   Activity Tolerance Patient tolerated treatment well   Patient Left in chair;with call bell/phone within reach;with chair alarm set   Nurse Communication          Time: [phone] OT Time Calculation (min): 18 min  Charges: OT General Charges $OT Visit: 1 Procedure OT Treatments $Self Care/Home Management : 8-22 mins  Norman Herrlich, MS OTR/L  Pager: [place]unkie A Kleber Crean 07/18/2016, 5:41 PM

## 2016-07-18 NOTE — Discharge Instructions (Signed)
Follow with Primary MD [name], MD in 7 days   Get CBC, CMP, 2 view Chest X ray checked  by Primary MD or SNF MD in 5-7 days ( we routinely change or add medications that can affect your baseline labs and fluid status, therefore we recommend that you get the mentioned basic workup next visit with your PCP, your PCP may decide not to get them or add new tests based on their clinical decision)  Activity: As tolerated with Full fall precautions use walker/cane & assistance as needed  Disposition Home   Diet:    Heart Healthy    For Heart failure patients - Check your Weight same time everyday, if you gain over 2 pounds, or you develop in leg swelling, experience more shortness of breath or chest pain, call your Primary MD immediately. Follow Cardiac Low Salt Diet and 1.5 lit/day fluid restriction.  On your next visit with your primary care physician please Get Medicines reviewed and adjusted.  Please request your Prim.MD to go over all Hospital Tests and Procedure/Radiological results at the follow up, please get all Hospital records sent to your Prim MD by signing hospital release before you go home.  If you experience worsening of your admission symptoms, develop shortness of breath, life threatening emergency, suicidal or homicidal thoughts you must seek medical attention immediately by calling 911 or calling your MD immediately  if symptoms less severe.  You Must read complete instructions/literature along with all the possible adverse reactions/side effects for all the Medicines you take and that have been prescribed to you. Take any new Medicines after you have completely understood and accpet all the possible adverse reactions/side effects.   Do not drive, operate heavy machinery, perform activities at heights, swimming or participation in water activities or provide baby sitting services if your were admitted for syncope or siezures until you have seen by Primary MD or a Neurologist  and advised to do so again.  Do not drive when taking Pain medications.    Do not take more than prescribed Pain, Sleep and Anxiety Medications  Special Instructions: If you have smoked or chewed Tobacco  in the last 2 yrs please stop smoking, stop any regular Alcohol  and or any Recreational drug use.  Wear Seat belts while driving.   Please note  You were cared for by a hospitalist during your hospital stay. If you have any questions about your discharge medications or the care you received while you were in the hospital after you are discharged, you can call the unit and asked to speak with the hospitalist on call if the hospitalist that took care of you is not available. Once you are discharged, your primary care physician will handle any further medical issues. Please note that NO REFILLS for any discharge medications will be authorized once you are discharged, as it is imperative that you return to your primary care physician (or establish a relationship with a primary care physician if you do not have one) for your aftercare needs so that they can reassess your need for medications and monitor your lab values.

## 2016-07-18 NOTE — Progress Notes (Signed)
[place] for warfarin Indication: atrial fibrillation  Allergies  Allergen Reactions  . Ceftriaxone Other (See Comments)    Per MD progress note 2/12:  Infusion reaction.  Denies other associated symptoms such as diaphoresis, nausea, vomiting, dysuria, diarrhea, lower abdominal pain, received palpitations, swelling, difficulty breathing  . Hydralazine Hcl Other (See Comments)    Headache     Patient Measurements: Height: 5' (152.4 cm) Weight: 145 lb (65.8 kg) IBW/kg (Calculated) : 45.5  Vital Signs: Temp: 97.6 F (36.4 C) (02/22 0501) Temp Source: Oral (02/22 0501) BP: 124/79 (02/22 0501) Pulse Rate: 67 (02/22 0501)  Labs:  Recent Labs  07/16/16 0401 07/17/16 0428 07/18/16 0443  HGB  --  9.4* 9.7*  HCT  --  28.5* 30.1*  PLT  --  247 270  LABPROT 22.5* 22.7* 23.1*  INR 1.94 1.97 2.01  CREATININE  --  1.31* 1.17*    Estimated Creatinine Clearance: 32.5 mL/min (by C-G formula based on SCr of 1.17 mg/dL (H)).     Assessment: 61 yoF on warfarin PTA for atrial fibrillation/mitral valve repair presents with somnolence and difficult arousing. Pt is to resume warfarin per pharmacy. INR = 2.28>2.07>1.94>1.97>2.01.   PTA Warfarin Dose = 2.5mg  Mon/Wed/Fri, 1.25mg  Tues/Thurs/Sat/Sun  Goal of Therapy:  INR 2-3 Monitor platelets by anticoagulation protocol: Yes   Plan:  Warfarin 2.5mg  tonight x1 Monitor INR, S/Sx bleeding daily   Andrey Cota. Diona Foley, PharmD, [place] Clinical Pharmacist [phone] 07/18/2016

## 2016-07-18 NOTE — Discharge Summary (Signed)
Alexis [name] OHY:[id number] DOB: 1933/09/21 DOA: 07/12/2016  PCP: [name], MD  Admit date: 07/12/2016  Discharge date: 07/18/2016  Admitted From: Home   Disposition:  Home   Recommendations for Outpatient Follow-up:   Follow up with PCP in 81-2 weeks  PCP Please obtain BMP/CBC, 2 view CXR in 1week,  (see Discharge instructions)   PCP Please follow up on the following pending results: Check blood cultures in 81 weeks, monitor thiamine levels closely. Needs outpatient ID follow-up   Home Health: PT, RN, Aide   Equipment/Devices: none Consultations: ID over the phone Discharge Condition: Fair   CODE STATUS: Full   Diet Recommendation: Heart Healthy    Chief Complaint  Patient presents with  . Weakness    Pt was recently tx and discharged for endocarditis comes in today for weakness     Brief history of present illness from the day of admission and additional interim summary    83 y.o.female,with medical history significant of atrial fibrillation, CAD, CVA, mitral valve replacement, CABG recently admitted for strep bacteremia with mitral valve endocarditis, another admission secondary to reaction to Rocephin that was changed to ampicillin and gentamicin, was just discharged 07/09/2016, He presents with altered mental status and slurred speech, admitted for further workup, EEG was significant for seizures, MRI/MRA brain was poor quality, Had to be repeated with findings significant for DWI hyperintensity with isodense ADC map in the bilateral inferior thalamus and right upper midbrain                                                                 Hospital Course   Acute Encephalopathy - An episode of confusion, slurred speech, generalized weakness, and worsening visual acuity , she was afebrile, no  evidence of acute new infection, negative urinalysis, lactic acid within normal limits, his x-ray with no evidence of acute cardiopulmonary disease. - Neurology input greatly appreciated,EEG was suspicious of seizures, as well as possible Wernicke's encephalopathy and her B-12 was low.   Case was discussed with neurologist. She has been started on Keppra 500 twice a day, B-12 supplementation along with thiamine supplementation which he initially received in IV form. Her mentation is improving. She will follow with neurology outpatient within 81-2 weeks.  She is still quite weak and requires considerable assistance in activities of daily care, she will largely benefit from a family member being with her for the next few weeks.   Streptococcus viridans bacteremia with prosthetic mitral valve endocarditis - This is a recently known problem she already has a IV PICC line and was undergoing outpatient IV antibiotic treatment, she was on IV ampicillin and gentamicin through PICC line. Stop date for antibiotic treatment was March 12. Due to multiple reactions and borderline renal function she is now on vancomycin and gentamicin after discussions  with ID physician Dr. Graylon Good on 81/22/2018. We had initially placed her on daptomycin however due to financial constraints patient is unable to afford this medication. She now appears nontoxic. She had a repeat blood culture which was positive for contamination with coag-negative staph however above regimen will cover that as well. Request PCP to check blood cultures in 81 weeks and make sure patient follows with ID within 81-2 weeks.  Hypertension - Blood pressure acceptable, continue with home medication  Atrial fibrillation - CHA2DS2VASC score 6 - Continue bisoprolol for rate control. - On warfarin, INR 2  CKD stage III - At baseline, continue to monitor  Hyperlipidemia - Continue with Lipitor     Discharge diagnosis     Active Problems:    Congestive heart failure (HCC)   Bacteremia due to Streptococcus   Atrial fibrillation (HCC)   Streptococcal endocarditis   Mitral valve replaced   Adjustment disorder with mixed anxiety and depressed mood   TIA (transient ischemic attack)   Acute encephalopathy   Abnormal brain MRI   Seizures ([place])   Cerebrovascular disease    Discharge instructions    Discharge Instructions    Diet - low sodium heart healthy    Complete by:  As directed    Discharge instructions    Complete by:  As directed    Follow with Primary MD [name], MD in 81 days   Get CBC, CMP, 2 view Chest X ray checked  by Primary MD or SNF MD in 81-7 days ( we routinely change or add medications that can affect your baseline labs and fluid status, therefore we recommend that you get the mentioned basic workup next visit with your PCP, your PCP may decide not to get them or add new tests based on their clinical decision)  Activity: As tolerated with Full fall precautions use walker/cane & assistance as needed  Disposition Home   Diet:    Heart Healthy    For Heart failure patients - Check your Weight same time everyday, if you gain over 2 pounds, or you develop in leg swelling, experience more shortness of breath or chest pain, call your Primary MD immediately. Follow Cardiac Low Salt Diet and 1.5 lit/day fluid restriction.  On your next visit with your primary care physician please Get Medicines reviewed and adjusted.  Please request your Prim.MD to go over all Hospital Tests and Procedure/Radiological results at the follow up, please get all Hospital records sent to your Prim MD by signing hospital release before you go home.  If you experience worsening of your admission symptoms, develop shortness of breath, life threatening emergency, suicidal or homicidal thoughts you must seek medical attention immediately by calling 911 or calling your MD immediately  if symptoms less severe.  You Must read complete  instructions/literature along with all the possible adverse reactions/side effects for all the Medicines you take and that have been prescribed to you. Take any new Medicines after you have completely understood and accpet all the possible adverse reactions/side effects.   Do not drive, operate heavy machinery, perform activities at heights, swimming or participation in water activities or provide baby sitting services if your were admitted for syncope or siezures until you have seen by Primary MD or a Neurologist and advised to do so again.  Do not drive when taking Pain medications.    Do not take more than prescribed Pain, Sleep and Anxiety Medications  Special Instructions: If you have smoked or chewed Tobacco  in  the last 2 yrs please stop smoking, stop any regular Alcohol  and or any Recreational drug use.  Wear Seat belts while driving.   Please note  You were cared for by a hospitalist during your hospital stay. If you have any questions about your discharge medications or the care you received while you were in the hospital after you are discharged, you can call the unit and asked to speak with the hospitalist on call if the hospitalist that took care of you is not available. Once you are discharged, your primary care physician will handle any further medical issues. Please note that NO REFILLS for any discharge medications will be authorized once you are discharged, as it is imperative that you return to your primary care physician (or establish a relationship with a primary care physician if you do not have one) for your aftercare needs so that they can reassess your need for medications and monitor your lab values.   Home infusion instructions [facility] May follow [facility] Dosing Protocol; May administer Cathflo as needed to maintain patency of vascular access device.; Flushing of vascular access device: per [facility] Protocol: 0.9% NaCl pre/post medica...    Complete by:  As  directed    Instructions:  May follow [facility] Dosing Protocol   Instructions:  May administer Cathflo as needed to maintain patency of vascular access device.   Instructions:  Flushing of vascular access device: per [facility] Protocol: 0.9% NaCl pre/post medication administration and prn patency; Heparin 100 u/ml, 10m for implanted ports and Heparin 10u/ml, 565mfor all other central venous catheters.   Instructions:  May follow [facility] Anaphylaxis Protocol for First Dose Administration in the home: 0.9% NaCl at 25-50 ml/hr to maintain IV access for protocol meds. Epinephrine 0.3 ml IV/IM PRN and Benadryl 25-50 IV/IM PRN s/s of anaphylaxis.   Instructions:  [facility]fusion Coordinator (RN) to assist per patient IV care needs in the home PRN.   Increase activity slowly    Complete by:  As directed       Discharge Medications   Allergies as of 07/18/2016      Reactions   Ceftriaxone Other (See Comments)   Per MD progress note 2/12:  Infusion reaction.  Denies other associated symptoms such as diaphoresis, nausea, vomiting, dysuria, diarrhea, lower abdominal pain, received palpitations, swelling, difficulty breathing   Hydralazine Hcl Other (See Comments)   Headache      Medication List    STOP taking these medications   penicillin G IVPB     TAKE these medications   atorvastatin 10 MG tablet Commonly known as:  LIPITOR Take 1 tablet (10 mg total) by mouth daily.   bisoprolol 5 MG tablet Commonly known as:  ZEBETA Take 0.5 tablets (2.5 mg total) by mouth daily. What changed:  how much to take   cyanocobalamin 1000 MCG tablet Take 0.5 tablets (500 mcg total) by mouth daily. Start taking on:  07/19/2016   feeding supplement (ENSURE ENLIVE) Liqd Take 237 mLs by mouth 2 (two) times daily between meals.   gabapentin 100 MG capsule Commonly known as:  NEURONTIN Take 1 capsule (100 mg total) by mouth 2 (two) times daily.   gentamicin IVPB Commonly known as:   GARAMYCIN Inject 60 mg into the vein daily. Indication: prosthetic valve endocarditis Last Day of Therapy:  08/05/16 Labs - Sunday/Monday:  CBC/D, BMP, and gentamicin trough. Labs - Thursday:  BMP and gentamicin trough Labs - Every other week:  ESR and CRP What changed:  additional instructions   levETIRAcetam 500 MG tablet Commonly known as:  KEPPRA Take 1 tablet (500 mg total) by mouth 2 (two) times daily.   loratadine 10 MG tablet Commonly known as:  CLARITIN Take 1 tablet (10 mg total) by mouth daily.   ramipril 2.5 MG capsule Commonly known as:  ALTACE Take 1 capsule (2.5 mg total) by mouth daily.   thiamine 100 MG tablet Take 1 tablet (100 mg total) by mouth daily. Start taking on:  07/19/2016   torsemide 5 MG tablet Commonly known as:  DEMADEX Take 0.5 tablets (2.5 mg total) by mouth every Monday, Wednesday, and Friday.   triamcinolone ointment 0.5 % Commonly known as:  KENALOG Apply 1 application topically 2 (two) times daily.   vancomycin IVPB Inject 1,000 mg into the vein daily. Indication: prosthetic valve endocarditis Last Day of Therapy:  08/05/16 Labs - 'Sunday/Monday:  CBC/D, BMP, and vancomycin trough. Labs - Thursday:  BMP and vancomycin trough Labs - Every other week:  ESR and CRP   Vitamin D3 2000 units capsule Take 1 capsule (2,000 Units total) by mouth daily.   warfarin 2.5 MG tablet Commonly known as:  COUMADIN Take as directed by anticoagulation clinic What changed:  how much to take  how to take this  when to take this  additional instructions            Home Infusion Instuctions        Start     Ordered   07/18/16 0000  Home infusion instructions [facility] May follow [facility] Dosing Protocol; May administer Cathflo as needed to maintain patency of vascular access device.; Flushing of vascular access device: per [facility] Protocol: 0.9% NaCl pre/post medica...    Question Answer Comment  Instructions May follow [facility] Dosing  Protocol   Instructions May administer Cathflo as needed to maintain patency of vascular access device.   Instructions Flushing of vascular access device: per [facility] Protocol: 0.9% NaCl pre/post medication administration and prn patency; Heparin 100 u/ml, 5ml for implanted ports and Heparin 10u/ml, 5ml for all other central venous catheters.   Instructions May follow [facility] Anaphylaxis Protocol for First Dose Administration in the home: 0.9% NaCl at 25-50 ml/hr to maintain IV access for protocol meds. Epinephrine 0.3 ml IV/IM PRN and Benadryl 25-50 IV/IM PRN s/s of anaphylaxis.   Instructions [facility] Infusion Coordinator (RN) to assist per patient IV care needs in the home PRN.      02' /22/18 1145      Follow-up Information    [facility] Follow up.   Why:  Call this number to get a free appointment with an insurance Navigator to get medical insurance through the [employer].   Contact information: [phone]       [name], MD. Schedule an appointment as soon as possible for a visit in 1 week(s).   Specialty:  Internal Medicine Contact information: [place] [address] [phone]        [facility]. Schedule an appointment as soon as possible for a visit in 1 week(s).   Why:  seizures Contact information: [address]     [place] [address] [phone]       Carlyle Basques, MD. Schedule an appointment as soon as possible for a visit in 1 week(s).   Specialty:  Infectious Diseases Why:  Infective endocarditis Contact information: [address] [place] [address] [phone]           Major procedures and Radiology Reports - PLEASE review detailed and final reports thoroughly  -        Dg Chest 2 View  Result Date: 07/12/2016 CLINICAL DATA:  Sepsis, generalized weakness, recent flu symptoms, history hypertension, atrial fibrillation,  stroke, coronary artery disease EXAM: CHEST  2 VIEW COMPARISON:  07/08/2016 FINDINGS: RIGHT arm PICC line tip projects over SVC. Minimal enlargement of cardiac silhouette post CABG, MVR, and AVR. Atherosclerotic calcification aorta. Mild pulmonary vascular congestion. Bibasilar atelectasis. Lungs otherwise clear. No pleural effusion or pneumothorax. Osseous demineralization. IMPRESSION: Minimal enlargement of cardiac silhouette post CABG, AVR and MVR. Aortic atherosclerosis. Bibasilar atelectasis. Electronically Signed   By: Lavonia Dana M.D.   On: 07/12/2016 13:58   Ct Head Wo Contrast  Result Date: 07/12/2016 CLINICAL DATA:  Confusion, lethargy, body weakness and slurred speech beginning today, history stroke, coronary artery disease post CABG, atrial fibrillation, hypertension EXAM: CT HEAD WITHOUT CONTRAST TECHNIQUE: Contiguous axial images were obtained from the base of the skull through the vertex without intravenous contrast. COMPARISON:  None FINDINGS: Brain: Generalized atrophy. Normal ventricular morphology. No midline shift or mass effect. Mild small vessel chronic ischemic changes of deep cerebral white matter. No intracranial hemorrhage, mass lesion or evidence acute infarction. No extra-axial fluid collections. Vascular: Extensive atherosclerotic calcification of internal carotid and vertebrobasilar arteries at skullbase. Skull: Intact Sinuses/Orbits: Clear Other: N/A IMPRESSION: Atrophy with mild small vessel chronic ischemic changes of deep cerebral white matter. No acute intracranial abnormalities. Electronically Signed   By: Lavonia Dana M.D.   On: 07/12/2016 16:20   Mr Jodene Nam Head Wo Contrast  Result Date: 07/14/2016 CLINICAL DATA:  Altered mental status. EXAM: MRI HEAD WITHOUT CONTRAST MRA HEAD WITHOUT CONTRAST TECHNIQUE: Multiplanar, multiecho pulse sequences of the brain and surrounding structures were obtained without intravenous contrast. Angiographic images of the head were obtained using  MRA technique without contrast. COMPARISON:  Same study from yesterday FINDINGS: MRI HEAD FINDINGS Brain: Unchanged DWI hyperintensity with isodense ADC map in the bilateral inferior thalamus and right upper midbrain. No new abnormality to suggest acute infarct, hemorrhage, hydrocephalus, or mass effect. Extensive motion degradation in this altercation. Subtle findings could be obscured. Vascular: Arterial findings below. Unremarkable dural venous sinuses and deep venous flow voids. Skull and upper cervical spine: Negative Sinuses/Orbits: Cataract resection on the left. MRA HEAD FINDINGS The carotid and vertebral arteries are distorted by motion but are widely patent. Non degraded basilar has a smooth widely patent appearance. There is a large posterior communicating artery on the right with fetal type PCA anatomy. On source images there is suggestion of a top basilar infundibulum from hypoplastic P1 or perforator, relationship to the thalamus/midbrain findings uncertain as the associated vessel is below the resolution of MRA. Mild for age atherosclerotic irregularity of bilateral MCA branches. No major branch occlusion. Negative for aneurysm. IMPRESSION: 1. Motion degraded brain MRI with stable findings in the bilateral thalamus and right midbrain. No revision to prior differential considerations. 2. No structural explanation for partial seizures from the left temporal lobe. 3. Motion degraded but better quality MRA. No major vessel occlusion or flow limiting stenosis. Electronically Signed   By: Monte Fantasia M.D.   On: 07/14/2016 13:06   Mr Brain Wo Contrast  Result Date: 07/14/2016 CLINICAL DATA:  Altered mental status. EXAM: MRI HEAD WITHOUT CONTRAST MRA HEAD WITHOUT CONTRAST TECHNIQUE: Multiplanar, multiecho pulse sequences of the brain  and surrounding structures were obtained without intravenous contrast. Angiographic images of the head were obtained using MRA technique without contrast. COMPARISON:   Same study from yesterday FINDINGS: MRI HEAD FINDINGS Brain: Unchanged DWI hyperintensity with isodense ADC map in the bilateral inferior thalamus and right upper midbrain. No new abnormality to suggest acute infarct, hemorrhage, hydrocephalus, or mass effect. Extensive motion degradation in this altercation. Subtle findings could be obscured. Vascular: Arterial findings below. Unremarkable dural venous sinuses and deep venous flow voids. Skull and upper cervical spine: Negative Sinuses/Orbits: Cataract resection on the left. MRA HEAD FINDINGS The carotid and vertebral arteries are distorted by motion but are widely patent. Non degraded basilar has a smooth widely patent appearance. There is a large posterior communicating artery on the right with fetal type PCA anatomy. On source images there is suggestion of a top basilar infundibulum from hypoplastic P1 or perforator, relationship to the thalamus/midbrain findings uncertain as the associated vessel is below the resolution of MRA. Mild for age atherosclerotic irregularity of bilateral MCA branches. No major branch occlusion. Negative for aneurysm. IMPRESSION: 1. Motion degraded brain MRI with stable findings in the bilateral thalamus and right midbrain. No revision to prior differential considerations. 2. No structural explanation for partial seizures from the left temporal lobe. 3. Motion degraded but better quality MRA. No major vessel occlusion or flow limiting stenosis. Electronically Signed   By: Monte Fantasia M.D.   On: 07/14/2016 13:06   Mr Brain Wo Contrast  Result Date: 07/13/2016 CLINICAL DATA:  Generalized weakness. Altered mental status. Recent mitral valve endocarditis. EXAM: MRI HEAD WITHOUT CONTRAST MRA HEAD WITHOUT CONTRAST TECHNIQUE: Multiplanar, multiecho pulse sequences of the brain and surrounding structures were obtained without intravenous contrast. Angiographic images of the head were obtained using MRA technique without contrast.  COMPARISON:  Yesterday FINDINGS: MRI HEAD FINDINGS Brain: Hyperintense DWI signal along the lower thalamus bilaterally extending into the upper right midbrain right of the cerebral aquaduct. These areas are isointense on ADC map. The deep gray nuclei corpus callosum have normal signal and appearance. The mamillary bodies are not well covered due to slice selection and motion artifact. Mild signal abnormality in the cerebral white matter and dorsal pons, likely chronic microvascular ischemia. Normal brain volume. No hemorrhage or hydrocephalus. Negative for mass. Vascular: Major flow voids are preserved, including the deep veins and basilar. Skull and upper cervical spine: No marrow lesion noted. Sinuses/Orbits: Left cataract resection. MRA HEAD FINDINGS Extremely motion degraded exam. Poorly visualized proximal right P1 segment is attributed to fetal type circulation based on contemporaneous T2 weighted acquisition. There is likely bilateral atherosclerotic irregularity. No proximal occlusion noted. IMPRESSION: 1. Abnormal signal in the lower thalami and right midbrain favor subacute infarct from thalamoperforators or artery of Percheron. Would correlate for symptoms/risk factors of Wernicke's, which can also affect these structures. 2. Extremely limited intracranial MRA due to more motion artifact. No proximal occlusion noted. Electronically Signed   By: Monte Fantasia M.D.   On: 07/13/2016 11:05   Dg Chest Port 1 View  Result Date: 06/28/2016 CLINICAL DATA:  Central line placement. EXAM: PORTABLE CHEST 1 VIEW COMPARISON:  None. FINDINGS: The patient's right PICC is noted ending about the mid SVC. Mild vascular congestion is noted. Mild peribronchial thickening is seen. Minimal bilateral atelectasis is seen. No pleural effusion or pneumothorax is identified. The cardiomediastinal silhouette is mildly enlarged. The patient is status post median sternotomy. No acute osseous abnormalities are identified.  IMPRESSION: 1. Right PICC noted ending about the mid  SVC. 2. Mild vascular congestion and mild cardiomegaly. Peribronchial thickening seen. Minimal bilateral atelectasis noted. Electronically Signed   By: Garald Balding M.D.   On: 06/28/2016 16:48   Dg Abdomen Acute W/chest  Result Date: 07/08/2016 CLINICAL DATA:  Epigastric pain tonight. EXAM: DG ABDOMEN ACUTE W/ 1V CHEST COMPARISON:  Chest 06/28/2016 FINDINGS: Postoperative changes in the mediastinum. Right PICC line with tip over the low SVC region. Shallow inspiration. Heart size and pulmonary vascularity are normal. Linear scarring or atelectasis in the mid lungs, similar to prior study. Calcified and tortuous aorta. Prominent upper abdominal vascular calcifications. Scattered gas and stool throughout the colon. No small or large bowel distention. No free intra- abdominal air. No abnormal air-fluid levels. Calcifications in the pelvis consistent with fibroids. Vascular calcifications in the aorta and iliac arteries. Degenerative changes in the spine and hips. IMPRESSION: No evidence of active pulmonary disease. Nonobstructive bowel gas pattern. Vascular calcifications. Calcified fibroids. Electronically Signed   By: Lucienne Capers M.D.   On: 07/08/2016 06:03   Mr Jodene Nam Head/brain KD Cm  Result Date: 07/13/2016 CLINICAL DATA:  Generalized weakness. Altered mental status. Recent mitral valve endocarditis. EXAM: MRI HEAD WITHOUT CONTRAST MRA HEAD WITHOUT CONTRAST TECHNIQUE: Multiplanar, multiecho pulse sequences of the brain and surrounding structures were obtained without intravenous contrast. Angiographic images of the head were obtained using MRA technique without contrast. COMPARISON:  Yesterday FINDINGS: MRI HEAD FINDINGS Brain: Hyperintense DWI signal along the lower thalamus bilaterally extending into the upper right midbrain right of the cerebral aquaduct. These areas are isointense on ADC map. The deep gray nuclei corpus callosum have normal  signal and appearance. The mamillary bodies are not well covered due to slice selection and motion artifact. Mild signal abnormality in the cerebral white matter and dorsal pons, likely chronic microvascular ischemia. Normal brain volume. No hemorrhage or hydrocephalus. Negative for mass. Vascular: Major flow voids are preserved, including the deep veins and basilar. Skull and upper cervical spine: No marrow lesion noted. Sinuses/Orbits: Left cataract resection. MRA HEAD FINDINGS Extremely motion degraded exam. Poorly visualized proximal right P1 segment is attributed to fetal type circulation based on contemporaneous T2 weighted acquisition. There is likely bilateral atherosclerotic irregularity. No proximal occlusion noted. IMPRESSION: 1. Abnormal signal in the lower thalami and right midbrain favor subacute infarct from thalamoperforators or artery of Percheron. Would correlate for symptoms/risk factors of Wernicke's, which can also affect these structures. 2. Extremely limited intracranial MRA due to more motion artifact. No proximal occlusion noted. Electronically Signed   By: Monte Fantasia M.D.   On: 07/13/2016 11:05    Micro Results     Recent Results (from the past 240 hour(s))  Blood Culture (routine x 2)     Status: Abnormal   Collection Time: 07/12/16  1:29 PM  Result Value Ref Range Status   Specimen Description BLOOD LEFT ANTECUBITAL  Final   Special Requests BOTTLES DRAWN AEROBIC AND ANAEROBIC 5CC  Final   Culture  Setup Time   Final    GRAM POSITIVE COCCI IN CLUSTERS ANAEROBIC BOTTLE ONLY CRITICAL RESULT CALLED TO, READ BACK BY AND VERIFIED WITH: T STONE,PHARMD AT 1920 07/13/16 BY L BENFIELD    Culture (A)  Final    STAPHYLOCOCCUS SPECIES (COAGULASE NEGATIVE) THE SIGNIFICANCE OF ISOLATING THIS ORGANISM FROM A SINGLE SET OF BLOOD CULTURES WHEN MULTIPLE SETS ARE DRAWN IS UNCERTAIN. PLEASE NOTIFY THE MICROBIOLOGY DEPARTMENT WITHIN ONE WEEK IF SPECIATION AND SENSITIVITIES ARE  REQUIRED.    Report Status 07/15/2016 FINAL  Final  Blood Culture ID Panel (Reflexed)     Status: Abnormal   Collection Time: 07/12/16  1:29 PM  Result Value Ref Range Status   Enterococcus species NOT DETECTED NOT DETECTED Final   Listeria monocytogenes NOT DETECTED NOT DETECTED Final   Staphylococcus species DETECTED (A) NOT DETECTED Final    Comment: Methicillin (oxacillin) resistant coagulase negative staphylococcus. Possible blood culture contaminant (unless isolated from more than one blood culture draw or clinical case suggests pathogenicity). No antibiotic treatment is indicated for blood  culture contaminants. CRITICAL RESULT CALLED TO, READ BACK BY AND VERIFIED WITH: T STONE,PHARMD AT 1920 07/13/16 BY L BENFIELD    Staphylococcus aureus NOT DETECTED NOT DETECTED Final   Methicillin resistance DETECTED (A) NOT DETECTED Final    Comment: CRITICAL RESULT CALLED TO, READ BACK BY AND VERIFIED WITH: T STONE,PHARMD AT 1920 07/13/16 BY L BENFIELD    Streptococcus species NOT DETECTED NOT DETECTED Final   Streptococcus agalactiae NOT DETECTED NOT DETECTED Final   Streptococcus pneumoniae NOT DETECTED NOT DETECTED Final   Streptococcus pyogenes NOT DETECTED NOT DETECTED Final   Acinetobacter baumannii NOT DETECTED NOT DETECTED Final   Enterobacteriaceae species NOT DETECTED NOT DETECTED Final   Enterobacter cloacae complex NOT DETECTED NOT DETECTED Final   Escherichia coli NOT DETECTED NOT DETECTED Final   Klebsiella oxytoca NOT DETECTED NOT DETECTED Final   Klebsiella pneumoniae NOT DETECTED NOT DETECTED Final   Proteus species NOT DETECTED NOT DETECTED Final   Serratia marcescens NOT DETECTED NOT DETECTED Final   Haemophilus influenzae NOT DETECTED NOT DETECTED Final   Neisseria meningitidis NOT DETECTED NOT DETECTED Final   Pseudomonas aeruginosa NOT DETECTED NOT DETECTED Final   Candida albicans NOT DETECTED NOT DETECTED Final   Candida glabrata NOT DETECTED NOT DETECTED Final    Candida krusei NOT DETECTED NOT DETECTED Final   Candida parapsilosis NOT DETECTED NOT DETECTED Final   Candida tropicalis NOT DETECTED NOT DETECTED Final  Blood Culture (routine x 2)     Status: None   Collection Time: 07/12/16  2:15 PM  Result Value Ref Range Status   Specimen Description BLOOD BLOOD LEFT FOREARM  Final   Special Requests AEROBIC BOTTLE ONLY 5CC  Final   Culture NO GROWTH 5 DAYS  Final   Report Status 07/17/2016 FINAL  Final  Urine culture     Status: None   Collection Time: 07/12/16  3:13 PM  Result Value Ref Range Status   Specimen Description URINE, RANDOM  Final   Special Requests NONE  Final   Culture NO GROWTH  Final   Report Status 07/13/2016 FINAL  Final    Today   Subjective    Alexis [name] today has no headache,no chest abdominal pain,no new weakness tingling or numbness, feels much better wants to go home today.     Objective   Blood pressure 106/73, pulse 85, temperature 97.5 F (36.4 C), temperature source Oral, resp. rate 18, height 5' (1.524 m), weight 65.8 kg (145 lb), SpO2 100 %.   Intake/Output Summary (Last 24 hours) at 07/18/16 1159 Last data filed at 07/18/16 0913  Gross per 24 hour  Intake              877 ml  Output                0 ml  Net              877 ml    Exam Awake Alert, Oriented x 3, No new  F.N deficits, Normal affect [place].AT,PERRAL Supple Neck,No JVD, No cervical lymphadenopathy appriciated.  Symmetrical Chest wall movement, Good air movement bilaterally, CTAB RRR,No Gallops,Rubs or new Murmurs, No Parasternal Heave +ve B.Sounds, Abd Soft, Non tender, No organomegaly appriciated, No rebound -guarding or rigidity. No Cyanosis, Clubbing or edema, No new Rash or bruise   Data Review   CBC w Diff:  Lab Results  Component Value Date   WBC 8.1 07/18/2016   HGB 9.7 (L) 07/18/2016   HCT 30.1 (L) 07/18/2016   PLT 270 07/18/2016   LYMPHOPCT 10 07/12/2016   MONOPCT 11 07/12/2016   EOSPCT 2 07/12/2016   BASOPCT  0 07/12/2016    CMP:  Lab Results  Component Value Date   NA 136 07/18/2016   K 4.5 07/18/2016   CL 106 07/18/2016   CO2 21 (L) 07/18/2016   BUN 23 (H) 07/18/2016   CREATININE 1.17 (H) 07/18/2016   PROT 7.2 07/12/2016   ALBUMIN 3.0 (L) 07/12/2016   BILITOT 0.9 07/12/2016   ALKPHOS 65 07/12/2016   AST 23 07/12/2016   ALT 17 07/12/2016  .   Total Time in preparing paper work, data evaluation and todays exam - 35 minutes  Thurnell Lose M.D on 07/18/2016 at 11:59 AM  [facility]   Office  [phone]

## 2016-07-18 NOTE — Progress Notes (Signed)
Discharge instructions reviewed with patient/family. All questions answered at this time. RXs given. No other concerns voice. Transport home with family.   Ave Filter, RN

## 2016-07-18 NOTE — Progress Notes (Signed)
Pt c/o of pain in the left forearm with a rating of 9, also c/o of pain all over, daughter at bedside who seems very anxious as well as the patient, tab tylenol 2 given at 0455, pt was able to lift up both arms and legs without any difficulty, was however reassured and repositioned in bed. Pt's daughter had deep concern if she can leave pt at home by herself because she has to go to work, she has forms from her job for the doctor to fill out, daughter was however told to leave forms in room for the rounding doctor this morning, and that this will be passed on to the day shift RN. Obasogie-Asidi, Sameria Morss Efe

## 2016-07-18 NOTE — Care Management Note (Signed)
Case Management Note  Patient Details  Name: Alexis [name] MRN: [id number] Date of Birth: [birth year]  Subjective/Objective:                    Action/Plan: CM spoke to patients daughter Alexis [name]) over the phone about patient discharging today and needing someone with her at home. CM inquired if SNF rehab would be an option to finish out her IV antibiotic therapy. Alexis [name] doesn't want her in a SNF d/t the language barrier. She states it is difficult for her to work and get to hospital or to a SNF. She feels they will do better once home if her mother's condition improves.  Alexis [name] asking for someone to inform her of when her mother will be back to "normal". I informed her that that wasn't something that anyone could most likely give her a time frame on. She continued to states she was trying to get Dr [facility] on the phone to answer these questions.  Alexis [name] did state that she has arranged a care giver for tomorrow while she is at work. She plans on taking off work Monday and Tuesday and is attempting to get some FMLA so she can be at home with her mom.  CM informed Alexis [name] with [facility] IV therapy of new orders for [facility] and that Alexis [name] does not feel they need an aide and that she will be d/ced home later today.  Alexis [name] to pick her mother up after 5 pm.   Expected Discharge Date:   (Pending)               Expected Discharge Plan:  [place]  In-House Referral:     Discharge planning Services  CM Consult  Post Acute Care Choice:  Home Health Choice offered to:  Adult Children  DME Arranged:    DME Agency:     HH Arranged:  RN, PT, Nurse's Aide, Social Work, IV Antibiotics (daughter refusing aide) [place]:  [facility]  Status of Service:  Completed, signed off  If discussed at [employer], dates discussed:    Additional Comments:  Pollie Friar, RN 07/18/2016, 11:45 AM

## 2016-07-18 NOTE — Progress Notes (Signed)
Pharmacy Antibiotic Note  Alexis [name] is a 83 y.o. female admitted on 07/12/2016 with endocarditis.  Pharmacy is consulted to transition patient to vancomycin.  Plan: Vancomycin 1g IV q24h Stop daptomycin and gentamicin Monitor culture data, renal function and clinical course VT at SS prn  Height: 5' (152.4 cm) Weight: 145 lb (65.8 kg) IBW/kg (Calculated) : 45.5  Temp (24hrs), Avg:97.9 F (36.6 C), Min:97.5 F (36.4 C), Max:98.6 F (37 C)   Recent Labs Lab 07/12/16 1326 07/12/16 1336 07/12/16 1337 07/14/16 0439 07/15/16 0427 07/17/16 0428 07/17/16 1940 07/17/16 2115 07/18/16 0443  WBC 7.6  --   --  7.0 7.2 7.8  --   --  8.1  CREATININE 1.20* 1.30*  --  1.16* 1.19* 1.31*  --   --  1.17*  LATICACIDVEN  --   --  0.93  --   --   --   --   --   --   GENTTROUGH  --   --   --   --   --   --  0.5  --   --   GENTPEAK  --   --   --   --   --   --   --  3.8*  --     Estimated Creatinine Clearance: 32.5 mL/min (by C-G formula based on SCr of 1.17 mg/dL (H)).    Allergies  Allergen Reactions  . Ceftriaxone Other (See Comments)    Per MD progress note 2/12:  Infusion reaction.  Denies other associated symptoms such as diaphoresis, nausea, vomiting, dysuria, diarrhea, lower abdominal pain, received palpitations, swelling, difficulty breathing  . Hydralazine Hcl Other (See Comments)    Headache     Andrey Cota. Diona Foley, PharmD, BCPS Clinical Pharmacist [phone] 07/18/2016 11:09 AM

## 2016-07-19 ENCOUNTER — Telehealth: Payer: Self-pay

## 2016-07-19 NOTE — Telephone Encounter (Signed)
Transition Care Management Follow-up Telephone Call    Date discharged? 07/18/2016  How have you been since you were released from the hospital? Pt is not doing very well.   Do you understand why you were in the hospital? Pt dtr understands  Do you understand the discharge instructions? Pt dtr has a mild understanding. There was some communication barrier  Where were you discharged to? HOME   Items Reviewed:  Medications reviewed: Yes  Allergies reviewed: Yes  Dietary changes reviewed: Yes  Referrals reviewed: Yes  Functional Questionnaire:   Activities of Daily Living (ADLs):   States they are independent in the following: No able to do ADL's without assistance States they require assistance with the following: Need assistance with all ADL's  Any transportation issues/concerns?: No  Any patient concerns? Pt dtr has a lot of questions about prognosis   Confirmed importance and date/time of follow-up visits scheduled Yes  Provider Appointment booked with [facility] 07/23/2016 at 10:30am  Confirmed with patient if condition begins to worsen call PCP or go to the ER.  Yes Patient was given the office number and encouraged to call back with question or concerns: Yes and dtr stated  understanding

## 2016-07-20 NOTE — Telephone Encounter (Signed)
Noted. Thx.

## 2016-07-23 ENCOUNTER — Encounter: Payer: Self-pay | Admitting: Internal Medicine

## 2016-07-23 ENCOUNTER — Telehealth: Payer: Self-pay | Admitting: Pharmacist

## 2016-07-23 ENCOUNTER — Encounter: Payer: Self-pay | Admitting: Pharmacist

## 2016-07-23 ENCOUNTER — Ambulatory Visit (INDEPENDENT_AMBULATORY_CARE_PROVIDER_SITE_OTHER): Payer: Self-pay | Admitting: Internal Medicine

## 2016-07-23 VITALS — BP 122/66 | HR 62 | Temp 97.9°F | Resp 16 | Ht 60.0 in | Wt 145.0 lb

## 2016-07-23 DIAGNOSIS — R7881 Bacteremia: Secondary | ICD-10-CM

## 2016-07-23 DIAGNOSIS — I5032 Chronic diastolic (congestive) heart failure: Secondary | ICD-10-CM

## 2016-07-23 DIAGNOSIS — I482 Chronic atrial fibrillation, unspecified: Secondary | ICD-10-CM

## 2016-07-23 DIAGNOSIS — E538 Deficiency of other specified B group vitamins: Secondary | ICD-10-CM

## 2016-07-23 DIAGNOSIS — B955 Unspecified streptococcus as the cause of diseases classified elsewhere: Secondary | ICD-10-CM

## 2016-07-23 DIAGNOSIS — R569 Unspecified convulsions: Secondary | ICD-10-CM

## 2016-07-23 MED ORDER — CYANOCOBALAMIN 1000 MCG/ML IJ SOLN
1000.0000 ug | Freq: Once | INTRAMUSCULAR | Status: AC
Start: 2016-07-23 — End: 2016-07-23
  Administered 2016-07-23: 1000 ug via INTRAMUSCULAR

## 2016-07-23 MED ORDER — CYANOCOBALAMIN 1000 MCG PO TABS: 1000.0000 ug | ORAL_TABLET | Freq: Every day | ORAL | 0 refills | Status: DC

## 2016-07-23 MED ORDER — VITAMIN D3 50 MCG (2000 UT) PO CAPS: 2000.0000 [IU] | ORAL_CAPSULE | Freq: Every day | ORAL | 3 refills | Status: DC

## 2016-07-23 NOTE — Assessment & Plan Note (Signed)
On Coumadin 

## 2016-07-23 NOTE — Progress Notes (Signed)
Pre-visit discussion using our clinic review tool. No additional management support is needed unless otherwise documented below in the visit note.  

## 2016-07-23 NOTE — Progress Notes (Signed)
Subjective:  Patient ID: Alexis [name], female    DOB: October 28, 1933  Age: 83 y.o. MRN: [id number]  CC: Hospitalization Follow-up (CVA, fatigue, unable to lifet objects such as a cup, abd pain, right shoulder pain. unstable poor )   HPI Alexis [name] presents for post-hosp stay f/u. We reviewed recent Jan-Feb 2018 admissions documents re: sepsis and seizure CVA. The pt has been somnolent and mildly confused since d/c. A little better now  Past Medical History:  Diagnosis Date  . Anxiety   . Atrial fibrillation ([place])   . CAD (coronary artery disease) of artery bypass graft   . CVA (cerebral vascular accident) ([place]) 05/2000   "memory issues, dizziness since" (07/08/2016  . Depression   . Dizzinesses    "often since [facility] 2009" (07/08/2016)  . Endocarditis dx'd 05/2016  . Foot fracture, left    "no OR; from fall"  . GERD (gastroesophageal reflux disease)    hx  . High cholesterol   . History of blood transfusion ~ 1960  . Hypertension   . Pneumonia 1-2 X  . Polyarthritis rheumatica (HCC)    Past Surgical History:  Procedure Laterality Date  . AORTIC VALVE REPLACEMENT  03/2008  . CARDIAC VALVE REPLACEMENT    . CATARACT EXTRACTION W/ INTRAOCULAR LENS IMPLANT Left   . CORONARY ANGIOPLASTY  2009  . CORONARY ARTERY BYPASS GRAFT  03/2008  . MITRAL VALVE REPAIR (MV)/CORONARY ARTERY BYPASS GRAFTING (CABG)  03/2008   2009 [facility]  . TEE WITHOUT CARDIOVERSION N/A 06/26/2016   Procedure: TRANSESOPHAGEAL ECHOCARDIOGRAM (TEE);  Surgeon: Josue Hector, MD;  Location: [facility] ENDOSCOPY;  Service: Cardiovascular;  Laterality: N/A;    reports that she has never smoked. She has never used smokeless tobacco. She reports that she does not drink alcohol or use drugs. family history includes Stroke in her maternal grandmother. Allergies  Allergen Reactions  . Ceftriaxone Other (See Comments)    Per MD progress note 2/12:  Infusion reaction.  Denies other associated symptoms such as  diaphoresis, nausea, vomiting, dysuria, diarrhea, lower abdominal pain, received palpitations, swelling, difficulty breathing  . Hydralazine Hcl Other (See Comments)    Headache      Outpatient Medications Prior to Visit  Medication Sig Dispense Refill  . atorvastatin (LIPITOR) 10 MG tablet Take 1 tablet (10 mg total) by mouth daily. 90 tablet 3  . bisoprolol (ZEBETA) 5 MG tablet Take 0.5 tablets (2.5 mg total) by mouth daily. (Patient taking differently: Take 1.25 mg by mouth daily. ) 90 tablet 1  . Cholecalciferol (VITAMIN D3) 2000 units capsule Take 1 capsule (2,000 Units total) by mouth daily. 100 capsule 3  . feeding supplement, ENSURE ENLIVE, (ENSURE ENLIVE) LIQD Take 237 mLs by mouth 2 (two) times daily between meals. 237 mL 12  . gabapentin (NEURONTIN) 100 MG capsule Take 1 capsule (100 mg total) by mouth 2 (two) times daily. 60 capsule 0  . gentamicin (GARAMYCIN) IVPB Inject 60 mg into the vein daily. Indication: prosthetic valve endocarditis Last Day of Therapy:  08/05/16 Labs - Sunday/Monday:  CBC/D, BMP, and gentamicin trough. Labs - Thursday:  BMP and gentamicin trough Labs - Every other week:  ESR and CRP 18 Units 0  . levETIRAcetam (KEPPRA) 500 MG tablet Take 1 tablet (500 mg total) by mouth 2 (two) times daily. 60 tablet 0  . loratadine (CLARITIN) 10 MG tablet Take 1 tablet (10 mg total) by mouth daily. 100 tablet 3  . ramipril (ALTACE) 2.5 MG capsule Take 1 capsule (  2.5 mg total) by mouth daily. 30 capsule 0  . thiamine 100 MG tablet Take 1 tablet (100 mg total) by mouth daily. 30 tablet 0  . torsemide (DEMADEX) 5 MG tablet Take 0.5 tablets (2.5 mg total) by mouth every Monday, Wednesday, and Friday. 30 tablet 0  . triamcinolone ointment (KENALOG) 0.5 % Apply 1 application topically 2 (two) times daily. 60 g 3  . vancomycin IVPB Inject 1,000 mg into the vein daily. Indication: prosthetic valve endocarditis Last Day of Therapy:  08/05/16 Labs - Sunday/Monday:  CBC/D, BMP, and  vancomycin trough. Labs - Thursday:  BMP and vancomycin trough Labs - Every other week:  ESR and CRP 18 Units 0  . vitamin B-12 1000 MCG tablet Take 0.5 tablets (500 mcg total) by mouth daily. 30 tablet 0  . warfarin (COUMADIN) 2.5 MG tablet Take as directed by anticoagulation clinic (Patient taking differently: Take 1.25-2.5 mg by mouth See admin instructions. 1.25 mg in the evening on Sun/Tues/Thurs/Sat and 2.5 mg on Mon/Wed/Fri) 90 tablet 0   No facility-administered medications prior to visit.     ROS Review of Systems  Constitutional: Positive for fatigue. Negative for activity change, appetite change, chills, diaphoresis, fever and unexpected weight change.  HENT: Negative for congestion, ear pain, facial swelling, hearing loss, mouth sores, nosebleeds, postnasal drip, rhinorrhea, sinus pressure, sneezing, sore throat, tinnitus and trouble swallowing.   Eyes: Negative for pain, discharge, redness, itching and visual disturbance.  Respiratory: Negative for cough, chest tightness, shortness of breath, wheezing and stridor.   Cardiovascular: Negative for chest pain, palpitations and leg swelling.  Gastrointestinal: Negative for abdominal distention, anal bleeding, blood in stool, constipation, diarrhea, nausea and rectal pain.  Genitourinary: Negative for difficulty urinating, dysuria, flank pain, frequency, genital sores, hematuria, pelvic pain, urgency, vaginal bleeding and vaginal discharge.  Musculoskeletal: Positive for arthralgias and back pain. Negative for gait problem, joint swelling, neck pain and neck stiffness.  Skin: Negative.  Negative for rash.  Neurological: Positive for weakness. Negative for dizziness, tremors, seizures, syncope, speech difficulty, numbness and headaches.  Hematological: Negative for adenopathy. Does not bruise/bleed easily.  Psychiatric/Behavioral: Positive for confusion, decreased concentration and sleep disturbance. Negative for behavioral problems,  dysphoric mood and suicidal ideas. The patient is not nervous/anxious.     Objective:  BP 122/66   Pulse 62   Temp 97.9 F (36.6 C) (Oral)   Resp 16   Ht 5' (1.524 m)   Wt 145 lb (65.8 kg)   SpO2 98%   BMI 28.32 kg/m   BP Readings from Last 3 Encounters:  07/23/16 122/66  07/18/16 (!) 103/44  07/09/16 110/65    Wt Readings from Last 3 Encounters:  07/23/16 145 lb (65.8 kg)  07/12/16 145 lb (65.8 kg)  07/09/16 157 lb 4.8 oz (71.4 kg)    Physical Exam  Constitutional: She appears well-developed. No distress.  HENT:  Head: Normocephalic.  Right Ear: External ear normal.  Left Ear: External ear normal.  Nose: Nose normal.  Mouth/Throat: Oropharynx is clear and moist.  Eyes: Conjunctivae are normal. Pupils are equal, round, and reactive to light. Right eye exhibits no discharge. Left eye exhibits no discharge.  Neck: Normal range of motion. Neck supple. No JVD present. No tracheal deviation present. No thyromegaly present.  Cardiovascular: Normal rate, regular rhythm and normal heart sounds.   Pulmonary/Chest: No stridor. No respiratory distress. She has no wheezes.  Abdominal: Soft. Bowel sounds are normal. She exhibits no distension and no mass. There is no  tenderness. There is no rebound and no guarding.  Musculoskeletal: She exhibits no edema or tenderness.  Lymphadenopathy:    She has no cervical adenopathy.  Neurological: She displays normal reflexes. No cranial nerve deficit. She exhibits normal muscle tone. Coordination abnormal.  Skin: No rash noted. No erythema.  The pt is laying on the exam table Alert, cooperative, oriented except for the city [facility]) and a year "1918". She corrects when reminded  Lab Results  Component Value Date   WBC 8.1 07/18/2016   HGB 9.7 (L) 07/18/2016   HCT 30.1 (L) 07/18/2016   PLT 270 07/18/2016   GLUCOSE 90 07/18/2016   CHOL 138 07/13/2016   TRIG 105 07/13/2016   HDL 31 (L) 07/13/2016   LDLCALC 86 07/13/2016   ALT 17  07/12/2016   AST 23 07/12/2016   NA 136 07/18/2016   K 4.5 07/18/2016   CL 106 07/18/2016   CREATININE 1.17 (H) 07/18/2016   BUN 23 (H) 07/18/2016   CO2 21 (L) 07/18/2016   TSH 0.518 07/13/2016   INR 2.01 07/18/2016   HGBA1C 5.8 (H) 07/13/2016    Mr Mra Head Wo Contrast  Result Date: 07/14/2016 CLINICAL DATA:  Altered mental status. EXAM: MRI HEAD WITHOUT CONTRAST MRA HEAD WITHOUT CONTRAST TECHNIQUE: Multiplanar, multiecho pulse sequences of the brain and surrounding structures were obtained without intravenous contrast. Angiographic images of the head were obtained using MRA technique without contrast. COMPARISON:  Same study from yesterday FINDINGS: MRI HEAD FINDINGS Brain: Unchanged DWI hyperintensity with isodense ADC map in the bilateral inferior thalamus and right upper midbrain. No new abnormality to suggest acute infarct, hemorrhage, hydrocephalus, or mass effect. Extensive motion degradation in this altercation. Subtle findings could be obscured. Vascular: Arterial findings below. Unremarkable dural venous sinuses and deep venous flow voids. Skull and upper cervical spine: Negative Sinuses/Orbits: Cataract resection on the left. MRA HEAD FINDINGS The carotid and vertebral arteries are distorted by motion but are widely patent. Non degraded basilar has a smooth widely patent appearance. There is a large posterior communicating artery on the right with fetal type PCA anatomy. On source images there is suggestion of a top basilar infundibulum from hypoplastic P1 or perforator, relationship to the thalamus/midbrain findings uncertain as the associated vessel is below the resolution of MRA. Mild for age atherosclerotic irregularity of bilateral MCA branches. No major branch occlusion. Negative for aneurysm. IMPRESSION: 1. Motion degraded brain MRI with stable findings in the bilateral thalamus and right midbrain. No revision to prior differential considerations. 2. No structural explanation for  partial seizures from the left temporal lobe. 3. Motion degraded but better quality MRA. No major vessel occlusion or flow limiting stenosis. Electronically Signed   By: Monte Fantasia M.D.   On: 07/14/2016 13:06   Mr Brain Wo Contrast  Result Date: 07/14/2016 CLINICAL DATA:  Altered mental status. EXAM: MRI HEAD WITHOUT CONTRAST MRA HEAD WITHOUT CONTRAST TECHNIQUE: Multiplanar, multiecho pulse sequences of the brain and surrounding structures were obtained without intravenous contrast. Angiographic images of the head were obtained using MRA technique without contrast. COMPARISON:  Same study from yesterday FINDINGS: MRI HEAD FINDINGS Brain: Unchanged DWI hyperintensity with isodense ADC map in the bilateral inferior thalamus and right upper midbrain. No new abnormality to suggest acute infarct, hemorrhage, hydrocephalus, or mass effect. Extensive motion degradation in this altercation. Subtle findings could be obscured. Vascular: Arterial findings below. Unremarkable dural venous sinuses and deep venous flow voids. Skull and upper cervical spine: Negative Sinuses/Orbits: Cataract resection on the left.  MRA HEAD FINDINGS The carotid and vertebral arteries are distorted by motion but are widely patent. Non degraded basilar has a smooth widely patent appearance. There is a large posterior communicating artery on the right with fetal type PCA anatomy. On source images there is suggestion of a top basilar infundibulum from hypoplastic P1 or perforator, relationship to the thalamus/midbrain findings uncertain as the associated vessel is below the resolution of MRA. Mild for age atherosclerotic irregularity of bilateral MCA branches. No major branch occlusion. Negative for aneurysm. IMPRESSION: 1. Motion degraded brain MRI with stable findings in the bilateral thalamus and right midbrain. No revision to prior differential considerations. 2. No structural explanation for partial seizures from the left temporal lobe.  3. Motion degraded but better quality MRA. No major vessel occlusion or flow limiting stenosis. Electronically Signed   By: Monte Fantasia M.D.   On: 07/14/2016 13:06   Mr Brain Wo Contrast  Result Date: 07/13/2016 CLINICAL DATA:  Generalized weakness. Altered mental status. Recent mitral valve endocarditis. EXAM: MRI HEAD WITHOUT CONTRAST MRA HEAD WITHOUT CONTRAST TECHNIQUE: Multiplanar, multiecho pulse sequences of the brain and surrounding structures were obtained without intravenous contrast. Angiographic images of the head were obtained using MRA technique without contrast. COMPARISON:  Yesterday FINDINGS: MRI HEAD FINDINGS Brain: Hyperintense DWI signal along the lower thalamus bilaterally extending into the upper right midbrain right of the cerebral aquaduct. These areas are isointense on ADC map. The deep gray nuclei corpus callosum have normal signal and appearance. The mamillary bodies are not well covered due to slice selection and motion artifact. Mild signal abnormality in the cerebral white matter and dorsal pons, likely chronic microvascular ischemia. Normal brain volume. No hemorrhage or hydrocephalus. Negative for mass. Vascular: Major flow voids are preserved, including the deep veins and basilar. Skull and upper cervical spine: No marrow lesion noted. Sinuses/Orbits: Left cataract resection. MRA HEAD FINDINGS Extremely motion degraded exam. Poorly visualized proximal right P1 segment is attributed to fetal type circulation based on contemporaneous T2 weighted acquisition. There is likely bilateral atherosclerotic irregularity. No proximal occlusion noted. IMPRESSION: 1. Abnormal signal in the lower thalami and right midbrain favor subacute infarct from thalamoperforators or artery of [place]. Would correlate for symptoms/risk factors of Wernicke's, which can also affect these structures. 2. Extremely limited intracranial MRA due to more motion artifact. No proximal occlusion noted.  Electronically Signed   By: Monte Fantasia M.D.   On: 07/13/2016 11:05   Mr Jodene Nam Head/brain QQ Cm  Result Date: 07/13/2016 CLINICAL DATA:  Generalized weakness. Altered mental status. Recent mitral valve endocarditis. EXAM: MRI HEAD WITHOUT CONTRAST MRA HEAD WITHOUT CONTRAST TECHNIQUE: Multiplanar, multiecho pulse sequences of the brain and surrounding structures were obtained without intravenous contrast. Angiographic images of the head were obtained using MRA technique without contrast. COMPARISON:  Yesterday FINDINGS: MRI HEAD FINDINGS Brain: Hyperintense DWI signal along the lower thalamus bilaterally extending into the upper right midbrain right of the cerebral aquaduct. These areas are isointense on ADC map. The deep gray nuclei corpus callosum have normal signal and appearance. The mamillary bodies are not well covered due to slice selection and motion artifact. Mild signal abnormality in the cerebral white matter and dorsal pons, likely chronic microvascular ischemia. Normal brain volume. No hemorrhage or hydrocephalus. Negative for mass. Vascular: Major flow voids are preserved, including the deep veins and basilar. Skull and upper cervical spine: No marrow lesion noted. Sinuses/Orbits: Left cataract resection. MRA HEAD FINDINGS Extremely motion degraded exam. Poorly visualized proximal right P1 segment is  attributed to fetal type circulation based on contemporaneous T2 weighted acquisition. There is likely bilateral atherosclerotic irregularity. No proximal occlusion noted. IMPRESSION: 1. Abnormal signal in the lower thalami and right midbrain favor subacute infarct from thalamoperforators or artery of [place]her[place]. Would correlate for symptoms/risk factors of Wernicke's, which can also affect these structures. 2. Extremely limited intracranial MRA due to more motion artifact. No proximal occlusion noted. Electronically Signed   By: Monte Fantasia M.D.   On: 07/13/2016 11:05    Assessment & Plan:    Adrine was seen today for hospitalization follow-up.  Diagnoses and all orders for this visit:  Seizures ([place])   I am having Ms. [name] maintain her bisoprolol, warfarin, atorvastatin, triamcinolone ointment, loratadine, Vitamin D3, feeding supplement (ENSURE ENLIVE), torsemide, ramipril, gabapentin, gentamicin, vancomycin, levETIRAcetam, thiamine, and cyanocobalamin.  No orders of the defined types were placed in this encounter.    Follow-up: No Follow-up on file.  [name], MD

## 2016-07-23 NOTE — Assessment & Plan Note (Signed)
[place]

## 2016-07-23 NOTE — Assessment & Plan Note (Addendum)
???  thalamic CVA - somnolent On Keppra

## 2016-07-23 NOTE — Telephone Encounter (Signed)
Amy, PharmD from [facility], called to tell me that this patient's SCr went from 1.17 on 2/22 to 1.8 today.  She is currently on vanc + gent until 3/12 for VGS PVE. Spoke with Dr. Baxter Flattery -- will hold vanc and gent right now and follow-up on levels and BMET Thursday afternoon.  Her vanc and gent troughs are still in process from today. Amy verbalized understanding.

## 2016-07-23 NOTE — Assessment & Plan Note (Signed)
On IV abx via PICC line

## 2016-07-24 ENCOUNTER — Telehealth: Payer: Self-pay | Admitting: Internal Medicine

## 2016-07-24 LAB — CBC AND DIFFERENTIAL
HEMATOCRIT: 27 % — AB (ref 36–46)
Hemoglobin: 9.1 g/dL — AB (ref 12.0–16.0)
PLATELETS: 237 10*3/uL (ref 150–399)
WBC: 14.7 10*3/mL

## 2016-07-24 LAB — BASIC METABOLIC PANEL
BUN: 33 mg/dL — AB (ref 4–21)
CREATININE: 1.8 mg/dL — AB (ref 0.5–1.1)
Glucose: 135 mg/dL
Sodium: 127 mmol/L — AB (ref 137–147)

## 2016-07-24 NOTE — Telephone Encounter (Signed)
Debbie, nurse from [place], request to speak to the nurse concern about [name]'s health condition. Please give her a call once you have the chance, she has 3 questions.

## 2016-07-25 ENCOUNTER — Ambulatory Visit (INDEPENDENT_AMBULATORY_CARE_PROVIDER_SITE_OTHER): Payer: Self-pay | Admitting: General Practice

## 2016-07-25 DIAGNOSIS — I482 Chronic atrial fibrillation, unspecified: Secondary | ICD-10-CM

## 2016-07-25 DIAGNOSIS — Z5181 Encounter for therapeutic drug level monitoring: Secondary | ICD-10-CM

## 2016-07-25 DIAGNOSIS — Z7901 Long term (current) use of anticoagulants: Secondary | ICD-10-CM

## 2016-07-25 LAB — POCT INR: INR: 4.8

## 2016-07-25 NOTE — Telephone Encounter (Signed)
Just watch pls. To ER if worse Thx

## 2016-07-25 NOTE — Progress Notes (Signed)
I have reviewed and agree with the plan. 

## 2016-07-25 NOTE — Patient Instructions (Signed)
Pre visit review using our clinic review tool, if applicable. No additional management support is needed unless otherwise documented below in the visit note. 

## 2016-07-25 NOTE — Telephone Encounter (Signed)
Alexis [name] with [facility] states that she is having LUQ pain and gastric discomfort. Please advise.   Dx for congestive heart failure? Informed that patient does.   Order for INR okay? Gave verbal for INR weekly

## 2016-07-26 ENCOUNTER — Telehealth: Payer: Self-pay | Admitting: Family Medicine

## 2016-07-26 NOTE — Telephone Encounter (Signed)
Alexis [name] with [facility] advised.

## 2016-07-26 NOTE — Telephone Encounter (Signed)
Received a call from team health regarding patient. She reports patient has a history of TIA and seizures recently. Patient was started on Keppra. The RN reports the patient's daughter calls relaying that she has had balance issues, urination issues, hallucinations, and suicidal ideation. The RN reports the patient's daughter just wants permission to discontinue the [place]. I advised that given the reported complaints the patient needed to be evaluated by a physician immediately and should not stop the [place] until evaluated by a physician. She advised that she would relay this information to the patient's daughter.

## 2016-07-27 NOTE — Telephone Encounter (Signed)
Received a call from [employer] who went out ot pt's home to access the situation.  Spoke with pt and pt's daughter [name] (due to language barrier) and pt appeared happy and not having any thoughts of suicide at this time. Dr. Caryl Bis is aware.

## 2016-07-27 NOTE — Telephone Encounter (Signed)
Per Dr. Ellen Henri request called to advised pt to go to ED. No answer at this time. Will attempt to call again.

## 2016-07-27 NOTE — Telephone Encounter (Signed)
Attempted to call pt and her daughter multiple times; phone busy.  Per Dr. Caryl Bis called [facility] [employer] to have an officer go out to check on the patient.

## 2016-07-27 NOTE — Telephone Encounter (Signed)
Pt's daughter returned call and states that pt does have an appetite today and was currently eating yogurt.  She states the reason for her call was due to the severe side effects her mother was experiencing since taking the [place].  Some of her concerns include her mother's fatigue, severe pain to different parts of her body, and her hallucinations.  Pt's daughter would like to stop the medication.  Advised that medication should not be stopped unless a physician has ordered this.  When pt was discharged from the ED; she was told to follow up with [facility] Neurology; per daughter they do not have an appointment scheduled.  Advised that she should call first thing Monday morning to set up an appointment.

## 2016-07-27 NOTE — Telephone Encounter (Signed)
Per conversation with RN last night and CMA today patient is endorsing suicidal ideation. Given this and her other symptoms from last night we will have the police department check on her to discuss evaluation in the emergency room given her suicidal ideation.

## 2016-07-29 ENCOUNTER — Telehealth: Payer: Self-pay | Admitting: Internal Medicine

## 2016-07-29 ENCOUNTER — Ambulatory Visit (INDEPENDENT_AMBULATORY_CARE_PROVIDER_SITE_OTHER): Payer: Self-pay | Admitting: General Practice

## 2016-07-29 DIAGNOSIS — I482 Chronic atrial fibrillation, unspecified: Secondary | ICD-10-CM

## 2016-07-29 DIAGNOSIS — Z5181 Encounter for therapeutic drug level monitoring: Secondary | ICD-10-CM

## 2016-07-29 DIAGNOSIS — Z7901 Long term (current) use of anticoagulants: Secondary | ICD-10-CM

## 2016-07-29 LAB — POCT INR: INR: 2.7

## 2016-07-29 NOTE — Patient Instructions (Signed)
Pre visit review using our clinic review tool, if applicable. No additional management support is needed unless otherwise documented below in the visit note. 

## 2016-07-29 NOTE — Telephone Encounter (Signed)
Lisa from [place]  PT 32.2 INR 2.7  VANC every other day  FYI Dr. Camila Li: The pt was having issues with Keppra.  Med was stopped on Friday. Eating better, happier, getting around better.   Thanks [employer]

## 2016-07-29 NOTE — Progress Notes (Signed)
I have reviewed and agree with the plan. 

## 2016-07-29 NOTE — Telephone Encounter (Signed)
Noted  

## 2016-07-30 NOTE — Progress Notes (Unsigned)
Results entered and sent to scan  

## 2016-07-31 NOTE — Telephone Encounter (Signed)
Noted. Thx.

## 2016-08-06 ENCOUNTER — Ambulatory Visit (INDEPENDENT_AMBULATORY_CARE_PROVIDER_SITE_OTHER): Payer: Self-pay | Admitting: General Practice

## 2016-08-06 DIAGNOSIS — I482 Chronic atrial fibrillation, unspecified: Secondary | ICD-10-CM

## 2016-08-06 DIAGNOSIS — Z7901 Long term (current) use of anticoagulants: Secondary | ICD-10-CM

## 2016-08-06 DIAGNOSIS — Z5181 Encounter for therapeutic drug level monitoring: Secondary | ICD-10-CM

## 2016-08-06 LAB — POCT INR: INR: 2.5

## 2016-08-06 NOTE — Progress Notes (Signed)
I have reviewed and agree with the plan. 

## 2016-08-06 NOTE — Patient Instructions (Signed)
Pre visit review using our clinic review tool, if applicable. No additional management support is needed unless otherwise documented below in the visit note. 

## 2016-08-12 ENCOUNTER — Telehealth: Payer: Self-pay | Admitting: Internal Medicine

## 2016-08-12 LAB — POCT INR: INR: 2.1

## 2016-08-12 NOTE — Telephone Encounter (Signed)
Noted. Thx.

## 2016-08-12 NOTE — Telephone Encounter (Signed)
Alexis [name], nurse from [place], called to report PTINR and req instruction back  PT: 24.9 INR: 2.1  Alexis [name] also wants to report that Alexis [name] appetite is improving and she is no longer on abx.

## 2016-08-13 ENCOUNTER — Ambulatory Visit (INDEPENDENT_AMBULATORY_CARE_PROVIDER_SITE_OTHER): Payer: Self-pay | Admitting: General Practice

## 2016-08-13 DIAGNOSIS — Z7901 Long term (current) use of anticoagulants: Secondary | ICD-10-CM

## 2016-08-13 DIAGNOSIS — I482 Chronic atrial fibrillation, unspecified: Secondary | ICD-10-CM

## 2016-08-13 DIAGNOSIS — Z5181 Encounter for therapeutic drug level monitoring: Secondary | ICD-10-CM

## 2016-08-13 NOTE — Telephone Encounter (Signed)
New dosing instructions given to patient's daughter.  Patient being discharged from home health today.

## 2016-08-13 NOTE — Patient Instructions (Signed)
Pre visit review using our clinic review tool, if applicable. No additional management support is needed unless otherwise documented below in the visit note. 

## 2016-08-13 NOTE — Progress Notes (Signed)
I have reviewed and agree with the plan. 

## 2016-08-14 ENCOUNTER — Inpatient Hospital Stay: Payer: Self-pay | Admitting: Internal Medicine

## 2016-08-15 ENCOUNTER — Telehealth: Payer: Self-pay | Admitting: Internal Medicine

## 2016-08-15 ENCOUNTER — Telehealth: Payer: Self-pay

## 2016-08-15 NOTE — Telephone Encounter (Signed)
Patient called [facility] night and spoke with nurse concerned if patient could take natural supplements OTC w/ use of Coumadin. Wanted to take Ginko bibloba for memory. Patient was advise by Romeo Apple, RN that taking the supplement is also a blood thinner.. Patient also had dizziness. Complains of moderate dizziness and hasn't been evalauated for it. Was advise to see PCP. Spoke with patient daughter she states this has been occurring more frequently and wants to know if patient needs to be seen. Is available for 08/16/2016 after her 1030 am appt  with infectious disease.  Please advise

## 2016-08-15 NOTE — Telephone Encounter (Signed)
Daughter called back regarding making an appointment for tomorrow Please advise

## 2016-08-15 NOTE — Telephone Encounter (Signed)
Hold Torsemide and Zebeta (Bisoprolol) and see if better Thx

## 2016-08-16 ENCOUNTER — Inpatient Hospital Stay: Payer: Self-pay | Admitting: Internal Medicine

## 2016-08-16 NOTE — Telephone Encounter (Signed)
routring to tammy/sched---not sure if patient just needs appt??, let me know if you have any further questions, thanks

## 2016-08-16 NOTE — Telephone Encounter (Signed)
Spoke with daughter and notified her to hold on Torsemide and Bisoprolol to see if dizziness stops. To call back if sx get better or worse in a few days.Marland KitchenMarland Kitchen

## 2016-08-16 NOTE — Telephone Encounter (Signed)
Looks like Location manager has addressed.

## 2016-08-27 ENCOUNTER — Encounter: Payer: Self-pay | Admitting: Internal Medicine

## 2016-08-27 ENCOUNTER — Ambulatory Visit (INDEPENDENT_AMBULATORY_CARE_PROVIDER_SITE_OTHER): Payer: Self-pay | Admitting: Internal Medicine

## 2016-08-27 VITALS — BP 108/68 | HR 74 | Temp 98.2°F | Wt 144.0 lb

## 2016-08-27 DIAGNOSIS — T50905D Adverse effect of unspecified drugs, medicaments and biological substances, subsequent encounter: Secondary | ICD-10-CM

## 2016-08-27 DIAGNOSIS — B955 Unspecified streptococcus as the cause of diseases classified elsewhere: Secondary | ICD-10-CM

## 2016-08-27 DIAGNOSIS — I330 Acute and subacute infective endocarditis: Secondary | ICD-10-CM

## 2016-08-27 DIAGNOSIS — T887XXD Unspecified adverse effect of drug or medicament, subsequent encounter: Secondary | ICD-10-CM

## 2016-08-27 NOTE — Assessment & Plan Note (Signed)
I will repeat her blood cultures today and if negative, no further treatment indicated.  She will be called if positive.  rtc prn

## 2016-08-27 NOTE — Progress Notes (Signed)
   Subjective:    Patient ID: Alexis [name], female    DOB: Dec 25, 1933, 83 y.o.   MRN: [id number]  HPI Here for hsfu. She is originally from [place] and has a history of AVR with tissue valve and MVR tissue valve who developed Strep viridans mitral prosthetic valve endocarditis.  She initially was on ceftriaxone with gentamicin and developed side effects and came back to the hospital and changed to continuous penicillin and completed 6 weeks of therapy on 3/12.  No fever, no chills and feeling well otherwise.  Some hip pain on right.  No DOE though is walking just with a walker.      Review of Systems  Constitutional: Negative for chills, fatigue and fever.  Gastrointestinal: Negative for diarrhea.  Skin: Negative for rash.  Neurological: Negative for dizziness.       Objective:   Physical Exam  Constitutional: She appears well-developed and well-nourished.  Eyes: No scleral icterus.  Cardiovascular: Normal rate, regular rhythm and normal heart sounds.   Skin: No rash noted.          Assessment & Plan:

## 2016-08-27 NOTE — Assessment & Plan Note (Signed)
Rash and other side effects resolved.

## 2016-09-02 ENCOUNTER — Telehealth: Payer: Self-pay | Admitting: *Deleted

## 2016-09-02 ENCOUNTER — Other Ambulatory Visit: Payer: Self-pay | Admitting: *Deleted

## 2016-09-02 ENCOUNTER — Telehealth: Payer: Self-pay | Admitting: Internal Medicine

## 2016-09-02 MED ORDER — ATORVASTATIN CALCIUM 10 MG PO TABS: 10.0000 mg | ORAL_TABLET | Freq: Every day | ORAL | 1 refills | Status: DC

## 2016-09-02 MED ORDER — WARFARIN SODIUM 2.5 MG PO TABS: 1.2500 mg | ORAL_TABLET | ORAL | 0 refills | Status: DC

## 2016-09-02 MED ORDER — BISOPROLOL FUMARATE 5 MG PO TABS: 5.0000 mg | ORAL_TABLET | Freq: Every day | ORAL | 1 refills | Status: DC

## 2016-09-02 NOTE — Telephone Encounter (Signed)
Patients daughter called and said they need a letter for patient. Saying that the patient can not work for the next 12 months. 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 Please follow up with daughter once it has been done. Thank you.

## 2016-09-02 NOTE — Telephone Encounter (Signed)
-----   Message from Thayer Headings, MD sent at 09/02/2016  9:58 AM EDT ----- Please let her know that the repeat blood culture remained negative so no concerns or need for any further treatment.  thanks

## 2016-09-02 NOTE — Telephone Encounter (Signed)
Relayed results to patient's daughter. She was relieved, states that her mother is feeling so much better.  They pass along their gratitude to Dr [name] and his entire team for everything. Landis Gandy, RN

## 2016-09-02 NOTE — Telephone Encounter (Signed)
Rec'd call from pt daughter stating mom needing refills on her bisoprolol, warfarin, and atorvastatin. Verified pt chart & pharmacy inform will send to [facility] in [place]...Johny Chess

## 2016-09-03 LAB — CULTURE, BLOOD (SINGLE)
ORGANISM ID, BACTERIA: NO GROWTH
Organism ID, Bacteria: NO GROWTH

## 2016-09-03 NOTE — Telephone Encounter (Signed)
Okay to write

## 2016-09-03 NOTE — Telephone Encounter (Signed)
OK. Thx

## 2016-09-04 NOTE — Telephone Encounter (Signed)
Spoke with daughter and she gave me a number to the billing office for [employer] with is over patient and it eems we may to to fill out a long term disability form. LM with pt to contact pts employer and find out.

## 2016-09-05 NOTE — Telephone Encounter (Signed)
Letter wrote and ready for Dr. Alain Marion to sign. Will call patient once its signed.

## 2016-09-06 NOTE — Telephone Encounter (Signed)
Letter put in basket to be mailed.

## 2016-09-06 NOTE — Telephone Encounter (Signed)
LM notifying pt letter is ready

## 2016-09-06 NOTE — Telephone Encounter (Signed)
Pt called in and is requesting that the letter needs to be mailed to her address in the system

## 2016-09-13 ENCOUNTER — Telehealth: Payer: Self-pay | Admitting: Internal Medicine

## 2016-09-13 MED ORDER — BISOPROLOL-HYDROCHLOROTHIAZIDE 2.5-6.25 MG PO TABS: 0.5000 | ORAL_TABLET | Freq: Every day | ORAL | 1 refills | Status: DC

## 2016-09-13 NOTE — Telephone Encounter (Signed)
I don't understand OK to use Bisoprolol 2.5 mg a day Thx

## 2016-09-13 NOTE — Telephone Encounter (Signed)
Pts daughter called with a concern about one of the pts medications. She said that a prescription was sent in for bisoprolol 5mg . She has been taking 1/2 of 2.5mg  in the morning. She spoke with the pharmacy and they told her that there is not a dose of bisoprolol available under 5 mg. So the only way for her to be able to take it would be using 2.5mg  along with a water pill. Any changes or additions can be sent in to [facility] in [place]. Please advise.

## 2016-09-13 NOTE — Telephone Encounter (Signed)
See below, please advise.

## 2016-09-13 NOTE — Telephone Encounter (Signed)
Pt notified, RX sent

## 2016-11-14 ENCOUNTER — Telehealth: Payer: Self-pay | Admitting: *Deleted

## 2016-11-14 MED ORDER — RAMIPRIL 2.5 MG PO CAPS: 2.5000 mg | ORAL_CAPSULE | Freq: Every day | ORAL | 0 refills | Status: DC

## 2016-11-14 NOTE — Telephone Encounter (Signed)
Rec;d call from pt daughter mom is needing a 90 day supply on the Ramipril. Verified pharmacy inform will send updated script to [place]...Johny Chess

## 2016-12-02 ENCOUNTER — Telehealth: Payer: Self-pay

## 2016-12-02 NOTE — Telephone Encounter (Signed)
Daughter notified patient need to have appt before having bloodwork

## 2016-12-02 NOTE — Telephone Encounter (Signed)
Pt dtr Rq lab work due to fever recently. Explained to pt dtr that pt was to follow up in April and advised that an appt would be needed to know what blood work to order due to multiple symptoms. Pt is scheduled but is requesting a call back today to confirm that PCP requires an appt.

## 2016-12-03 ENCOUNTER — Other Ambulatory Visit (INDEPENDENT_AMBULATORY_CARE_PROVIDER_SITE_OTHER): Payer: Self-pay

## 2016-12-03 ENCOUNTER — Ambulatory Visit (INDEPENDENT_AMBULATORY_CARE_PROVIDER_SITE_OTHER): Payer: Self-pay | Admitting: Internal Medicine

## 2016-12-03 ENCOUNTER — Encounter: Payer: Self-pay | Admitting: Internal Medicine

## 2016-12-03 DIAGNOSIS — B955 Unspecified streptococcus as the cause of diseases classified elsewhere: Secondary | ICD-10-CM

## 2016-12-03 DIAGNOSIS — R509 Fever, unspecified: Secondary | ICD-10-CM

## 2016-12-03 DIAGNOSIS — I330 Acute and subacute infective endocarditis: Secondary | ICD-10-CM

## 2016-12-03 HISTORY — DX: Fever, unspecified: R50.9

## 2016-12-03 LAB — CBC WITH DIFFERENTIAL/PLATELET
BASOS ABS: 0 10*3/uL (ref 0.0–0.1)
Basophils Relative: 0.5 % (ref 0.0–3.0)
EOS PCT: 0.9 % (ref 0.0–5.0)
Eosinophils Absolute: 0.1 10*3/uL (ref 0.0–0.7)
HCT: 33.8 % — ABNORMAL LOW (ref 36.0–46.0)
HEMOGLOBIN: 11.5 g/dL — AB (ref 12.0–15.0)
LYMPHS PCT: 15.4 % (ref 12.0–46.0)
Lymphs Abs: 1.1 10*3/uL (ref 0.7–4.0)
MCHC: 34 g/dL (ref 30.0–36.0)
MCV: 80.7 fl (ref 78.0–100.0)
MONOS PCT: 9.6 % (ref 3.0–12.0)
Monocytes Absolute: 0.7 10*3/uL (ref 0.1–1.0)
Neutro Abs: 5.3 10*3/uL (ref 1.4–7.7)
Neutrophils Relative %: 73.6 % (ref 43.0–77.0)
Platelets: 284 10*3/uL (ref 150.0–400.0)
RBC: 4.19 Mil/uL (ref 3.87–5.11)
RDW: 13.7 % (ref 11.5–15.5)
WBC: 7.2 10*3/uL (ref 4.0–10.5)

## 2016-12-03 LAB — HEPATIC FUNCTION PANEL
ALBUMIN: 4 g/dL (ref 3.5–5.2)
ALK PHOS: 68 U/L (ref 39–117)
ALT: 20 U/L (ref 0–35)
AST: 23 U/L (ref 0–37)
Bilirubin, Direct: 0.1 mg/dL (ref 0.0–0.3)
Total Bilirubin: 0.6 mg/dL (ref 0.2–1.2)
Total Protein: 8.5 g/dL — ABNORMAL HIGH (ref 6.0–8.3)

## 2016-12-03 LAB — BASIC METABOLIC PANEL
BUN: 21 mg/dL (ref 6–23)
CO2: 23 mEq/L (ref 19–32)
Calcium: 10.1 mg/dL (ref 8.4–10.5)
Chloride: 103 mEq/L (ref 96–112)
Creatinine, Ser: 1.22 mg/dL — ABNORMAL HIGH (ref 0.40–1.20)
GFR: 45 mL/min — ABNORMAL LOW (ref >60.00)
Glucose, Bld: 113 mg/dL — ABNORMAL HIGH (ref 70–99)
Potassium: 4 mEq/L (ref 3.5–5.1)
Sodium: 138 mEq/L (ref 135–145)

## 2016-12-03 LAB — URINALYSIS, ROUTINE W REFLEX MICROSCOPIC
BILIRUBIN URINE: NEGATIVE
Hgb urine dipstick: NEGATIVE
Ketones, ur: NEGATIVE
NITRITE: NEGATIVE
PH: 6 (ref 5.0–8.0)
SPECIFIC GRAVITY, URINE: 1.015 (ref 1.000–1.030)
Urine Glucose: NEGATIVE
Urobilinogen, UA: 0.2 (ref 0.0–1.0)

## 2016-12-03 LAB — SEDIMENTATION RATE: SED RATE: 65 mm/h — AB (ref 0–30)

## 2016-12-03 NOTE — Assessment & Plan Note (Signed)
Labs We may need to remove her 2 remaining teeth

## 2016-12-03 NOTE — Progress Notes (Signed)
Subjective:  Patient ID: Alexis [name], female    DOB: 1933-11-20  Age: 83 y.o. MRN: [id number]  CC: No chief complaint on file.   HPI Alexis [name] presents for L abd pain in the LUQ and fever of 100.7 on Fri. Took Tylenol - on Sat T 99.3. On Mon all sx's resolved; felt tired. She had some stool urgency and a bad smell to it. H/o Strep viridance bacteremia in 2/18. PICC line is out.  Per d/c on 07/18/16: "83 y.o.female,with medical history significant of atrial fibrillation, CAD, CVA, mitral valve replacement, CABG recently admitted for strep bacteremia with mitral valve endocarditis, another admission secondary to reaction to Rocephin that was changed to ampicillin and gentamicin, was just discharged 07/09/2016, He presents with altered mental status and slurred speech, admitted for further workup, EEG was significant for seizures, MRI/MRA brain was poor quality, Had to be repeated with findings significant for DWI hyperintensity with isodense ADC map in the bilateral inferior thalamus and right upper midbrain.  Streptococcus viridans bacteremia with prosthetic mitral valve endocarditis - This is a recently known problem she already has a IV PICC line and was undergoing outpatient IV antibiotic treatment, she was on IV ampicillin and gentamicin through PICC line. Stop date for antibiotic treatment was March 12. Due to multiple reactions and borderline renal function she is now on vancomycin and gentamicin after discussions with ID physician Dr. Graylon Good on 07/18/2016. We had initially placed her on daptomycin however due to financial constraints patient is unable to afford this medication. She now appears nontoxic. She had a repeat blood culture which was positive for contamination with coag-negative staph however above regimen will cover that as well".  Outpatient Medications Prior to Visit  Medication Sig Dispense Refill  . atorvastatin (LIPITOR) 10 MG tablet Take 1 tablet (10 mg  total) by mouth daily. 90 tablet 1  . bisoprolol (ZEBETA) 5 MG tablet Take 1 tablet (5 mg total) by mouth daily. 90 tablet 1  . bisoprolol-hydrochlorothiazide (ZIAC) 2.5-6.25 MG tablet Take 0.5 tablets by mouth daily. 45 tablet 1  . Cholecalciferol (VITAMIN D3) 2000 units capsule Take 1 capsule (2,000 Units total) by mouth daily. 100 capsule 3  . cyanocobalamin 1000 MCG tablet Take 1 tablet (1,000 mcg total) by mouth daily. 30 tablet 0  . feeding supplement, ENSURE ENLIVE, (ENSURE ENLIVE) LIQD Take 237 mLs by mouth 2 (two) times daily between meals. 237 mL 12  . levETIRAcetam (KEPPRA) 500 MG tablet Take 1 tablet (500 mg total) by mouth 2 (two) times daily. 60 tablet 0  . loratadine (CLARITIN) 10 MG tablet Take 1 tablet (10 mg total) by mouth daily. 100 tablet 3  . ramipril (ALTACE) 2.5 MG capsule Take 1 capsule (2.5 mg total) by mouth daily. Will need to make annual appt for future refills 90 capsule 0  . thiamine 100 MG tablet Take 1 tablet (100 mg total) by mouth daily. 30 tablet 0  . warfarin (COUMADIN) 2.5 MG tablet Take 0.5-1 tablets (1.25-2.5 mg total) by mouth See admin instructions. 1.25 mg in the evening on Sun/Tues/Thurs/Sat and 2.5 mg on Mon/Wed/Fri 90 tablet 0   No facility-administered medications prior to visit.     ROS Review of Systems  Constitutional: Positive for fatigue and fever. Negative for activity change, appetite change, chills and unexpected weight change.  HENT: Negative for congestion, mouth sores and sinus pressure.   Eyes: Negative for visual disturbance.  Respiratory: Negative for cough and chest tightness.   Gastrointestinal: Positive for  diarrhea. Negative for abdominal pain, blood in stool and nausea.  Genitourinary: Negative for difficulty urinating, frequency and vaginal pain.  Musculoskeletal: Negative for back pain and gait problem.  Skin: Negative for pallor and rash.  Neurological: Negative for dizziness, tremors, weakness, numbness and headaches.    Psychiatric/Behavioral: Negative for confusion and sleep disturbance.    Objective:  BP 118/78 (BP Location: Left Arm, Patient Position: Sitting, Cuff Size: Normal)   Pulse 90   Temp 98 F (36.7 C) (Oral)   Ht 5' (1.524 m)   Wt 145 lb (65.8 kg)   SpO2 98%   BMI 28.32 kg/m   BP Readings from Last 3 Encounters:  12/03/16 118/78  08/27/16 108/68  07/23/16 122/66    Wt Readings from Last 3 Encounters:  12/03/16 145 lb (65.8 kg)  08/27/16 144 lb (65.3 kg)  07/23/16 145 lb (65.8 kg)    Physical Exam  Constitutional: She appears well-developed. No distress.  HENT:  Head: Normocephalic.  Right Ear: External ear normal.  Left Ear: External ear normal.  Nose: Nose normal.  Mouth/Throat: Oropharynx is clear and moist.  Eyes: Conjunctivae are normal. Pupils are equal, round, and reactive to light. Right eye exhibits no discharge. Left eye exhibits no discharge.  Neck: Normal range of motion. Neck supple. No JVD present. No tracheal deviation present. No thyromegaly present.  Cardiovascular: Normal rate, regular rhythm and normal heart sounds.   Pulmonary/Chest: No stridor. No respiratory distress. She has no wheezes.  Abdominal: Soft. Bowel sounds are normal. She exhibits no distension and no mass. There is no tenderness. There is no rebound and no guarding.  Musculoskeletal: She exhibits no edema or tenderness.  Lymphadenopathy:    She has no cervical adenopathy.  Neurological: She displays normal reflexes. No cranial nerve deficit. She exhibits normal muscle tone. Coordination normal.  Skin: No rash noted. No erythema.  Psychiatric: She has a normal mood and affect. Her behavior is normal. Judgment and thought content normal.  2 remaining teeth - lower jaw  Lab Results  Component Value Date   WBC 14.7 07/24/2016   HGB 9.1 (A) 07/24/2016   HCT 27 (A) 07/24/2016   PLT 237 07/24/2016   GLUCOSE 90 07/18/2016   CHOL 138 07/13/2016   TRIG 105 07/13/2016   HDL 31 (L)  07/13/2016   LDLCALC 86 07/13/2016   ALT 17 07/12/2016   AST 23 07/12/2016   NA 127 (A) 07/24/2016   K 4.5 07/18/2016   CL 106 07/18/2016   CREATININE 1.8 (A) 07/24/2016   BUN 33 (A) 07/24/2016   CO2 21 (L) 07/18/2016   TSH 0.518 07/13/2016   INR 2.1 08/12/2016   HGBA1C 5.8 (H) 07/13/2016    Mr Mra Head Wo Contrast  Result Date: 07/14/2016 CLINICAL DATA:  Altered mental status. EXAM: MRI HEAD WITHOUT CONTRAST MRA HEAD WITHOUT CONTRAST TECHNIQUE: Multiplanar, multiecho pulse sequences of the brain and surrounding structures were obtained without intravenous contrast. Angiographic images of the head were obtained using MRA technique without contrast. COMPARISON:  Same study from yesterday FINDINGS: MRI HEAD FINDINGS Brain: Unchanged DWI hyperintensity with isodense ADC map in the bilateral inferior thalamus and right upper midbrain. No new abnormality to suggest acute infarct, hemorrhage, hydrocephalus, or mass effect. Extensive motion degradation in this altercation. Subtle findings could be obscured. Vascular: Arterial findings below. Unremarkable dural venous sinuses and deep venous flow voids. Skull and upper cervical spine: Negative Sinuses/Orbits: Cataract resection on the left. MRA HEAD FINDINGS The carotid and vertebral arteries  are distorted by motion but are widely patent. Non degraded basilar has a smooth widely patent appearance. There is a large posterior communicating artery on the right with fetal type PCA anatomy. On source images there is suggestion of a top basilar infundibulum from hypoplastic P1 or perforator, relationship to the thalamus/midbrain findings uncertain as the associated vessel is below the resolution of MRA. Mild for age atherosclerotic irregularity of bilateral MCA branches. No major branch occlusion. Negative for aneurysm. IMPRESSION: 1. Motion degraded brain MRI with stable findings in the bilateral thalamus and right midbrain. No revision to prior differential  considerations. 2. No structural explanation for partial seizures from the left temporal lobe. 3. Motion degraded but better quality MRA. No major vessel occlusion or flow limiting stenosis. Electronically Signed   By: Monte Fantasia M.D.   On: 07/14/2016 13:06   Mr Brain Wo Contrast  Result Date: 07/14/2016 CLINICAL DATA:  Altered mental status. EXAM: MRI HEAD WITHOUT CONTRAST MRA HEAD WITHOUT CONTRAST TECHNIQUE: Multiplanar, multiecho pulse sequences of the brain and surrounding structures were obtained without intravenous contrast. Angiographic images of the head were obtained using MRA technique without contrast. COMPARISON:  Same study from yesterday FINDINGS: MRI HEAD FINDINGS Brain: Unchanged DWI hyperintensity with isodense ADC map in the bilateral inferior thalamus and right upper midbrain. No new abnormality to suggest acute infarct, hemorrhage, hydrocephalus, or mass effect. Extensive motion degradation in this altercation. Subtle findings could be obscured. Vascular: Arterial findings below. Unremarkable dural venous sinuses and deep venous flow voids. Skull and upper cervical spine: Negative Sinuses/Orbits: Cataract resection on the left. MRA HEAD FINDINGS The carotid and vertebral arteries are distorted by motion but are widely patent. Non degraded basilar has a smooth widely patent appearance. There is a large posterior communicating artery on the right with fetal type PCA anatomy. On source images there is suggestion of a top basilar infundibulum from hypoplastic P1 or perforator, relationship to the thalamus/midbrain findings uncertain as the associated vessel is below the resolution of MRA. Mild for age atherosclerotic irregularity of bilateral MCA branches. No major branch occlusion. Negative for aneurysm. IMPRESSION: 1. Motion degraded brain MRI with stable findings in the bilateral thalamus and right midbrain. No revision to prior differential considerations. 2. No structural explanation  for partial seizures from the left temporal lobe. 3. Motion degraded but better quality MRA. No major vessel occlusion or flow limiting stenosis. Electronically Signed   By: Monte Fantasia M.D.   On: 07/14/2016 13:06   Mr Brain Wo Contrast  Result Date: 07/13/2016 CLINICAL DATA:  Generalized weakness. Altered mental status. Recent mitral valve endocarditis. EXAM: MRI HEAD WITHOUT CONTRAST MRA HEAD WITHOUT CONTRAST TECHNIQUE: Multiplanar, multiecho pulse sequences of the brain and surrounding structures were obtained without intravenous contrast. Angiographic images of the head were obtained using MRA technique without contrast. COMPARISON:  Yesterday FINDINGS: MRI HEAD FINDINGS Brain: Hyperintense DWI signal along the lower thalamus bilaterally extending into the upper right midbrain right of the cerebral aquaduct. These areas are isointense on ADC map. The deep gray nuclei corpus callosum have normal signal and appearance. The mamillary bodies are not well covered due to slice selection and motion artifact. Mild signal abnormality in the cerebral white matter and dorsal pons, likely chronic microvascular ischemia. Normal brain volume. No hemorrhage or hydrocephalus. Negative for mass. Vascular: Major flow voids are preserved, including the deep veins and basilar. Skull and upper cervical spine: No marrow lesion noted. Sinuses/Orbits: Left cataract resection. MRA HEAD FINDINGS Extremely motion degraded exam. Poorly  visualized proximal right P1 segment is attributed to fetal type circulation based on contemporaneous T2 weighted acquisition. There is likely bilateral atherosclerotic irregularity. No proximal occlusion noted. IMPRESSION: 1. Abnormal signal in the lower thalami and right midbrain favor subacute infarct from thalamoperforators or artery of [place]. Would correlate for symptoms/risk factors of Wernicke's, which can also affect these structures. 2. Extremely limited intracranial MRA due to more  motion artifact. No proximal occlusion noted. Electronically Signed   By: Monte Fantasia M.D.   On: 07/13/2016 11:05   Mr Jodene Nam Head/brain VE Cm  Result Date: 07/13/2016 CLINICAL DATA:  Generalized weakness. Altered mental status. Recent mitral valve endocarditis. EXAM: MRI HEAD WITHOUT CONTRAST MRA HEAD WITHOUT CONTRAST TECHNIQUE: Multiplanar, multiecho pulse sequences of the brain and surrounding structures were obtained without intravenous contrast. Angiographic images of the head were obtained using MRA technique without contrast. COMPARISON:  Yesterday FINDINGS: MRI HEAD FINDINGS Brain: Hyperintense DWI signal along the lower thalamus bilaterally extending into the upper right midbrain right of the cerebral aquaduct. These areas are isointense on ADC map. The deep gray nuclei corpus callosum have normal signal and appearance. The mamillary bodies are not well covered due to slice selection and motion artifact. Mild signal abnormality in the cerebral white matter and dorsal pons, likely chronic microvascular ischemia. Normal brain volume. No hemorrhage or hydrocephalus. Negative for mass. Vascular: Major flow voids are preserved, including the deep veins and basilar. Skull and upper cervical spine: No marrow lesion noted. Sinuses/Orbits: Left cataract resection. MRA HEAD FINDINGS Extremely motion degraded exam. Poorly visualized proximal right P1 segment is attributed to fetal type circulation based on contemporaneous T2 weighted acquisition. There is likely bilateral atherosclerotic irregularity. No proximal occlusion noted. IMPRESSION: 1. Abnormal signal in the lower thalami and right midbrain favor subacute infarct from thalamoperforators or artery of [place]. Would correlate for symptoms/risk factors of Wernicke's, which can also affect these structures. 2. Extremely limited intracranial MRA due to more motion artifact. No proximal occlusion noted. Electronically Signed   By: Monte Fantasia M.D.   On:  07/13/2016 11:05    Assessment & Plan:   There are no diagnoses linked to this encounter. I am having Ms. Corpuz maintain her loratadine, feeding supplement (ENSURE ENLIVE), levETIRAcetam, thiamine, cyanocobalamin, Vitamin D3, bisoprolol, atorvastatin, warfarin, bisoprolol-hydrochlorothiazide, and ramipril.  No orders of the defined types were placed in this encounter.    Follow-up: No Follow-up on file.  [name], MD

## 2016-12-03 NOTE — Assessment & Plan Note (Addendum)
h/o Strep viridance bacteremia in 2/18. PICC line is out. Labs We may need to remove her 2 remaining teeth IV abx if (+) cx

## 2016-12-04 ENCOUNTER — Other Ambulatory Visit: Payer: Self-pay

## 2016-12-04 DIAGNOSIS — R8290 Unspecified abnormal findings in urine: Secondary | ICD-10-CM

## 2016-12-05 ENCOUNTER — Other Ambulatory Visit: Payer: Self-pay

## 2016-12-05 DIAGNOSIS — R509 Fever, unspecified: Secondary | ICD-10-CM

## 2016-12-06 LAB — C. DIFFICILE GDH AND TOXIN A/B
C. DIFFICILE GDH: NOT DETECTED
C. difficile Toxin A/B: NOT DETECTED

## 2016-12-06 LAB — URINE CULTURE

## 2016-12-10 LAB — CULTURE, BLOOD (SINGLE): ORGANISM ID, BACTERIA: NO GROWTH

## 2016-12-11 ENCOUNTER — Telehealth: Payer: Self-pay | Admitting: Internal Medicine

## 2016-12-11 NOTE — Telephone Encounter (Signed)
Please advise about urine culture.

## 2016-12-11 NOTE — Telephone Encounter (Signed)
Please call daughter with Urine culture results

## 2016-12-11 NOTE — Telephone Encounter (Signed)
Urine Cx and blood cx (-) Thx

## 2016-12-12 NOTE — Telephone Encounter (Signed)
Daughter notified 

## 2017-01-30 ENCOUNTER — Other Ambulatory Visit: Payer: Self-pay | Admitting: Internal Medicine

## 2017-02-17 ENCOUNTER — Other Ambulatory Visit: Payer: Self-pay | Admitting: Internal Medicine

## 2017-03-06 ENCOUNTER — Telehealth: Payer: Self-pay | Admitting: Internal Medicine

## 2017-03-06 MED ORDER — WARFARIN SODIUM 2.5 MG PO TABS: 1.2500 mg | ORAL_TABLET | ORAL | 1 refills | Status: DC

## 2017-03-06 NOTE — Telephone Encounter (Signed)
Patient requesting 90 day supply of warfarin to be sent to [facility] in [place].

## 2017-03-06 NOTE — Telephone Encounter (Signed)
Forwarding to cindy in coumadin clinic for refill...Alexis [name]

## 2017-03-06 NOTE — Telephone Encounter (Signed)
Ok Thx 

## 2017-03-07 MED ORDER — WARFARIN SODIUM 2.5 MG PO TABS: 1.2500 mg | ORAL_TABLET | ORAL | 1 refills | Status: DC

## 2017-03-07 NOTE — Telephone Encounter (Signed)
MD printed script since system is down resending electronically to [facility]...Johny Chess

## 2017-03-19 ENCOUNTER — Other Ambulatory Visit: Payer: Self-pay | Admitting: Internal Medicine

## 2017-03-19 MED ORDER — RAMIPRIL 2.5 MG PO CAPS: 2.5000 mg | ORAL_CAPSULE | Freq: Every day | ORAL | 0 refills | Status: DC

## 2017-03-19 NOTE — Telephone Encounter (Signed)
30 supply sent in. Pt is overdue for CPE. 90 day supply denied.

## 2017-03-19 NOTE — Telephone Encounter (Signed)
Pt daughter called and needs a refill of her  ramipril (ALTACE) 2.5 MG capsule  Needs 90 day supply send to [facility] in [place]  Please advise

## 2017-03-19 NOTE — Telephone Encounter (Signed)
Pt daughter called regarding this and needs this done today because she cannot pick up the med tomorrow

## 2017-04-04 ENCOUNTER — Other Ambulatory Visit: Payer: Self-pay | Admitting: Internal Medicine

## 2017-04-18 ENCOUNTER — Telehealth: Payer: Self-pay

## 2017-04-18 MED ORDER — RAMIPRIL 2.5 MG PO CAPS: 2.5000 mg | ORAL_CAPSULE | Freq: Every day | ORAL | 2 refills | Status: DC

## 2017-04-18 NOTE — Telephone Encounter (Signed)
Pt dtr called and requested refill of ramipril.  Called and informed that erx has been sent.

## 2017-04-22 ENCOUNTER — Other Ambulatory Visit: Payer: Self-pay | Admitting: Internal Medicine

## 2017-04-25 ENCOUNTER — Ambulatory Visit (INDEPENDENT_AMBULATORY_CARE_PROVIDER_SITE_OTHER): Payer: Self-pay | Admitting: Internal Medicine

## 2017-04-25 ENCOUNTER — Encounter: Payer: Self-pay | Admitting: Internal Medicine

## 2017-04-25 ENCOUNTER — Other Ambulatory Visit (INDEPENDENT_AMBULATORY_CARE_PROVIDER_SITE_OTHER): Payer: Self-pay

## 2017-04-25 VITALS — BP 116/78 | HR 70 | Temp 98.0°F | Ht 60.0 in | Wt 154.0 lb

## 2017-04-25 DIAGNOSIS — I10 Essential (primary) hypertension: Secondary | ICD-10-CM

## 2017-04-25 DIAGNOSIS — R509 Fever, unspecified: Secondary | ICD-10-CM

## 2017-04-25 DIAGNOSIS — B955 Unspecified streptococcus as the cause of diseases classified elsewhere: Secondary | ICD-10-CM

## 2017-04-25 DIAGNOSIS — G459 Transient cerebral ischemic attack, unspecified: Secondary | ICD-10-CM

## 2017-04-25 DIAGNOSIS — I330 Acute and subacute infective endocarditis: Secondary | ICD-10-CM

## 2017-04-25 DIAGNOSIS — I5020 Unspecified systolic (congestive) heart failure: Secondary | ICD-10-CM

## 2017-04-25 DIAGNOSIS — R413 Other amnesia: Secondary | ICD-10-CM

## 2017-04-25 LAB — CBC WITH DIFFERENTIAL/PLATELET
BASOS ABS: 0 10*3/uL (ref 0.0–0.1)
Basophils Relative: 0.3 % (ref 0.0–3.0)
EOS ABS: 0.1 10*3/uL (ref 0.0–0.7)
Eosinophils Relative: 1.3 % (ref 0.0–5.0)
HCT: 36.5 % (ref 36.0–46.0)
Hemoglobin: 12.3 g/dL (ref 12.0–15.0)
LYMPHS ABS: 1.3 10*3/uL (ref 0.7–4.0)
Lymphocytes Relative: 17.2 % (ref 12.0–46.0)
MCHC: 33.6 g/dL (ref 30.0–36.0)
MCV: 82.9 fl (ref 78.0–100.0)
Monocytes Absolute: 0.8 10*3/uL (ref 0.1–1.0)
Monocytes Relative: 10.2 % (ref 3.0–12.0)
NEUTROS ABS: 5.4 10*3/uL (ref 1.4–7.7)
NEUTROS PCT: 71 % (ref 43.0–77.0)
PLATELETS: 242 10*3/uL (ref 150.0–400.0)
RBC: 4.4 Mil/uL (ref 3.87–5.11)
RDW: 14 % (ref 11.5–15.5)
WBC: 7.6 10*3/uL (ref 4.0–10.5)

## 2017-04-25 LAB — BASIC METABOLIC PANEL
BUN: 28 mg/dL — ABNORMAL HIGH (ref 6–23)
CHLORIDE: 103 meq/L (ref 96–112)
CO2: 27 mEq/L (ref 19–32)
CREATININE: 1.22 mg/dL — AB (ref 0.40–1.20)
Calcium: 9.8 mg/dL (ref 8.4–10.5)
GFR: 44.96 mL/min — AB (ref >60.00)
Glucose, Bld: 94 mg/dL (ref 70–99)
POTASSIUM: 3.5 meq/L (ref 3.5–5.1)
Sodium: 138 mEq/L (ref 135–145)

## 2017-04-25 LAB — PROTIME-INR
INR: 2 ratio — AB (ref 0.8–1.0)
Prothrombin Time: 21.2 s — ABNORMAL HIGH (ref 9.6–13.1)

## 2017-04-25 MED ORDER — ATORVASTATIN CALCIUM 10 MG PO TABS: 10.0000 mg | ORAL_TABLET | Freq: Every day | ORAL | 3 refills | Status: DC

## 2017-04-25 MED ORDER — WARFARIN SODIUM 2.5 MG PO TABS: 1.2500 mg | ORAL_TABLET | ORAL | 3 refills | Status: DC

## 2017-04-25 MED ORDER — BISOPROLOL-HYDROCHLOROTHIAZIDE 2.5-6.25 MG PO TABS: ORAL_TABLET | ORAL | 3 refills | Status: DC

## 2017-04-25 MED ORDER — B COMPLEX PO TABS: 1.0000 | ORAL_TABLET | Freq: Every day | ORAL | 3 refills | Status: DC

## 2017-04-25 MED ORDER — RAMIPRIL 2.5 MG PO CAPS: ORAL_CAPSULE | ORAL | 3 refills | Status: DC

## 2017-04-25 NOTE — Progress Notes (Signed)
Subjective:  Patient ID: Alexis [name], female    DOB: July 18, 1933  Age: 83 y.o. MRN: [id number]  CC: No chief complaint on file.   HPI Alexis [name] presents for CAD, HTN, dyslipidemia, fever f/u  Outpatient Medications Prior to Visit  Medication Sig Dispense Refill  . atorvastatin (LIPITOR) 10 MG tablet Take 1 tablet (10 mg total) by mouth daily. 90 tablet 1  . bisoprolol-hydrochlorothiazide (ZIAC) 2.5-6.25 MG tablet TAKE 1/2 (ONE-HALF) TABLET BY MOUTH ONCE DAILY 45 tablet 1  . Cholecalciferol (VITAMIN D3) 2000 units capsule Take 1 capsule (2,000 Units total) by mouth daily. 100 capsule 3  . cyanocobalamin 1000 MCG tablet Take 1 tablet (1,000 mcg total) by mouth daily. 30 tablet 0  . feeding supplement, ENSURE ENLIVE, (ENSURE ENLIVE) LIQD Take 237 mLs by mouth 2 (two) times daily between meals. 237 mL 12  . ramipril (ALTACE) 2.5 MG capsule TAKE 1 CAPSULE BY MOUTH ONCE DAILY ANNUAL  APT.  IS  DUE  MUST  SEE  PROVIDER  FOR  FUTURE  REFILLS 30 capsule 11  . thiamine 100 MG tablet Take 1 tablet (100 mg total) by mouth daily. 30 tablet 0  . warfarin (COUMADIN) 2.5 MG tablet Take 0.5-1 tablets (1.25-2.5 mg total) by mouth See admin instructions. 1.25 mg in the evening on Sun/Tues/Thurs/Sat and 2.5 mg on Mon/Wed/Fri 90 tablet 1  . bisoprolol (ZEBETA) 5 MG tablet Take 1 tablet (5 mg total) by mouth daily. 90 tablet 1  . levETIRAcetam (KEPPRA) 500 MG tablet Take 1 tablet (500 mg total) by mouth 2 (two) times daily. 60 tablet 0  . loratadine (CLARITIN) 10 MG tablet Take 1 tablet (10 mg total) by mouth daily. 100 tablet 3   No facility-administered medications prior to visit.     ROS Review of Systems  Constitutional: Negative for activity change, appetite change, chills, fatigue and unexpected weight change.  HENT: Negative for congestion, mouth sores and sinus pressure.   Eyes: Negative for visual disturbance.  Respiratory: Negative for cough and chest tightness.     Gastrointestinal: Negative for abdominal pain and nausea.  Genitourinary: Negative for difficulty urinating, frequency and vaginal pain.  Musculoskeletal: Negative for back pain and gait problem.  Skin: Negative for pallor and rash.  Neurological: Negative for dizziness, tremors, weakness, numbness and headaches.  Psychiatric/Behavioral: Negative for confusion, sleep disturbance and suicidal ideas.    Objective:  BP 116/78 (BP Location: Left Arm, Patient Position: Sitting, Cuff Size: Normal)   Pulse 70   Temp 98 F (36.7 C) (Oral)   Ht 5' (1.524 m)   Wt 154 lb (69.9 kg)   SpO2 99%   BMI 30.08 kg/m   BP Readings from Last 3 Encounters:  04/25/17 116/78  12/03/16 118/78  08/27/16 108/68    Wt Readings from Last 3 Encounters:  04/25/17 154 lb (69.9 kg)  12/03/16 145 lb (65.8 kg)  08/27/16 144 lb (65.3 kg)    Physical Exam  Constitutional: She appears well-developed. No distress.  HENT:  Head: Normocephalic.  Right Ear: External ear normal.  Left Ear: External ear normal.  Nose: Nose normal.  Mouth/Throat: Oropharynx is clear and moist.  Eyes: Conjunctivae are normal. Pupils are equal, round, and reactive to light. Right eye exhibits no discharge. Left eye exhibits no discharge.  Neck: Normal range of motion. Neck supple. No JVD present. No tracheal deviation present. No thyromegaly present.  Cardiovascular: Normal rate, regular rhythm and normal heart sounds.  Pulmonary/Chest: No stridor. No respiratory distress. She  has no wheezes.  Abdominal: Soft. Bowel sounds are normal. She exhibits no distension and no mass. There is no tenderness. There is no rebound and no guarding.  Musculoskeletal: She exhibits no edema or tenderness.  Lymphadenopathy:    She has no cervical adenopathy.  Neurological: She displays normal reflexes. No cranial nerve deficit. She exhibits normal muscle tone. Coordination normal.  Skin: No rash noted. No erythema.  Psychiatric: She has a normal  mood and affect. Her behavior is normal. Judgment and thought content normal.    Lab Results  Component Value Date   WBC 7.2 12/03/2016   HGB 11.5 (L) 12/03/2016   HCT 33.8 (L) 12/03/2016   PLT 284.0 12/03/2016   GLUCOSE 113 (H) 12/03/2016   CHOL 138 07/13/2016   TRIG 105 07/13/2016   HDL 31 (L) 07/13/2016   LDLCALC 86 07/13/2016   ALT 20 12/03/2016   AST 23 12/03/2016   NA 138 12/03/2016   K 4.0 12/03/2016   CL 103 12/03/2016   CREATININE 1.22 (H) 12/03/2016   BUN 21 12/03/2016   CO2 23 12/03/2016   TSH 0.518 07/13/2016   INR 2.1 08/12/2016   HGBA1C 5.8 (H) 07/13/2016    Mr Mra Head Wo Contrast  Result Date: 07/14/2016 CLINICAL DATA:  Altered mental status. EXAM: MRI HEAD WITHOUT CONTRAST MRA HEAD WITHOUT CONTRAST TECHNIQUE: Multiplanar, multiecho pulse sequences of the brain and surrounding structures were obtained without intravenous contrast. Angiographic images of the head were obtained using MRA technique without contrast. COMPARISON:  Same study from yesterday FINDINGS: MRI HEAD FINDINGS Brain: Unchanged DWI hyperintensity with isodense ADC map in the bilateral inferior thalamus and right upper midbrain. No new abnormality to suggest acute infarct, hemorrhage, hydrocephalus, or mass effect. Extensive motion degradation in this altercation. Subtle findings could be obscured. Vascular: Arterial findings below. Unremarkable dural venous sinuses and deep venous flow voids. Skull and upper cervical spine: Negative Sinuses/Orbits: Cataract resection on the left. MRA HEAD FINDINGS The carotid and vertebral arteries are distorted by motion but are widely patent. Non degraded basilar has a smooth widely patent appearance. There is a large posterior communicating artery on the right with fetal type PCA anatomy. On source images there is suggestion of a top basilar infundibulum from hypoplastic P1 or perforator, relationship to the thalamus/midbrain findings uncertain as the associated  vessel is below the resolution of MRA. Mild for age atherosclerotic irregularity of bilateral MCA branches. No major branch occlusion. Negative for aneurysm. IMPRESSION: 1. Motion degraded brain MRI with stable findings in the bilateral thalamus and right midbrain. No revision to prior differential considerations. 2. No structural explanation for partial seizures from the left temporal lobe. 3. Motion degraded but better quality MRA. No major vessel occlusion or flow limiting stenosis. Electronically Signed   By: Monte Fantasia M.D.   On: 07/14/2016 13:06   Mr Brain Wo Contrast  Result Date: 07/14/2016 CLINICAL DATA:  Altered mental status. EXAM: MRI HEAD WITHOUT CONTRAST MRA HEAD WITHOUT CONTRAST TECHNIQUE: Multiplanar, multiecho pulse sequences of the brain and surrounding structures were obtained without intravenous contrast. Angiographic images of the head were obtained using MRA technique without contrast. COMPARISON:  Same study from yesterday FINDINGS: MRI HEAD FINDINGS Brain: Unchanged DWI hyperintensity with isodense ADC map in the bilateral inferior thalamus and right upper midbrain. No new abnormality to suggest acute infarct, hemorrhage, hydrocephalus, or mass effect. Extensive motion degradation in this altercation. Subtle findings could be obscured. Vascular: Arterial findings below. Unremarkable dural venous sinuses and deep venous  flow voids. Skull and upper cervical spine: Negative Sinuses/Orbits: Cataract resection on the left. MRA HEAD FINDINGS The carotid and vertebral arteries are distorted by motion but are widely patent. Non degraded basilar has a smooth widely patent appearance. There is a large posterior communicating artery on the right with fetal type PCA anatomy. On source images there is suggestion of a top basilar infundibulum from hypoplastic P1 or perforator, relationship to the thalamus/midbrain findings uncertain as the associated vessel is below the resolution of MRA. Mild for  age atherosclerotic irregularity of bilateral MCA branches. No major branch occlusion. Negative for aneurysm. IMPRESSION: 1. Motion degraded brain MRI with stable findings in the bilateral thalamus and right midbrain. No revision to prior differential considerations. 2. No structural explanation for partial seizures from the left temporal lobe. 3. Motion degraded but better quality MRA. No major vessel occlusion or flow limiting stenosis. Electronically Signed   By: Monte Fantasia M.D.   On: 07/14/2016 13:06   Mr Brain Wo Contrast  Result Date: 07/13/2016 CLINICAL DATA:  Generalized weakness. Altered mental status. Recent mitral valve endocarditis. EXAM: MRI HEAD WITHOUT CONTRAST MRA HEAD WITHOUT CONTRAST TECHNIQUE: Multiplanar, multiecho pulse sequences of the brain and surrounding structures were obtained without intravenous contrast. Angiographic images of the head were obtained using MRA technique without contrast. COMPARISON:  Yesterday FINDINGS: MRI HEAD FINDINGS Brain: Hyperintense DWI signal along the lower thalamus bilaterally extending into the upper right midbrain right of the cerebral aquaduct. These areas are isointense on ADC map. The deep gray nuclei corpus callosum have normal signal and appearance. The mamillary bodies are not well covered due to slice selection and motion artifact. Mild signal abnormality in the cerebral white matter and dorsal pons, likely chronic microvascular ischemia. Normal brain volume. No hemorrhage or hydrocephalus. Negative for mass. Vascular: Major flow voids are preserved, including the deep veins and basilar. Skull and upper cervical spine: No marrow lesion noted. Sinuses/Orbits: Left cataract resection. MRA HEAD FINDINGS Extremely motion degraded exam. Poorly visualized proximal right P1 segment is attributed to fetal type circulation based on contemporaneous T2 weighted acquisition. There is likely bilateral atherosclerotic irregularity. No proximal occlusion  noted. IMPRESSION: 1. Abnormal signal in the lower thalami and right midbrain favor subacute infarct from thalamoperforators or artery of Percheron. Would correlate for symptoms/risk factors of Wernicke's, which can also affect these structures. 2. Extremely limited intracranial MRA due to more motion artifact. No proximal occlusion noted. Electronically Signed   By: Monte Fantasia M.D.   On: 07/13/2016 11:05   Mr Jodene Nam Head/brain RU Cm  Result Date: 07/13/2016 CLINICAL DATA:  Generalized weakness. Altered mental status. Recent mitral valve endocarditis. EXAM: MRI HEAD WITHOUT CONTRAST MRA HEAD WITHOUT CONTRAST TECHNIQUE: Multiplanar, multiecho pulse sequences of the brain and surrounding structures were obtained without intravenous contrast. Angiographic images of the head were obtained using MRA technique without contrast. COMPARISON:  Yesterday FINDINGS: MRI HEAD FINDINGS Brain: Hyperintense DWI signal along the lower thalamus bilaterally extending into the upper right midbrain right of the cerebral aquaduct. These areas are isointense on ADC map. The deep gray nuclei corpus callosum have normal signal and appearance. The mamillary bodies are not well covered due to slice selection and motion artifact. Mild signal abnormality in the cerebral white matter and dorsal pons, likely chronic microvascular ischemia. Normal brain volume. No hemorrhage or hydrocephalus. Negative for mass. Vascular: Major flow voids are preserved, including the deep veins and basilar. Skull and upper cervical spine: No marrow lesion noted. Sinuses/Orbits: Left cataract resection.  MRA HEAD FINDINGS Extremely motion degraded exam. Poorly visualized proximal right P1 segment is attributed to fetal type circulation based on contemporaneous T2 weighted acquisition. There is likely bilateral atherosclerotic irregularity. No proximal occlusion noted. IMPRESSION: 1. Abnormal signal in the lower thalami and right midbrain favor subacute infarct  from thalamoperforators or artery of P[place]heron. Would correlate for symptoms/risk factors of Wernicke's, which can also affect these structures. 2. Extremely limited intracranial MRA due to more motion artifact. No proximal occlusion noted. Electronically Signed   By: Monte Fantasia M.D.   On: 07/13/2016 11:05    Assessment & Plan:   There are no diagnoses linked to this encounter. I have discontinued Marche Kwiecinski's loratadine, levETIRAcetam, and bisoprolol. I am also having her maintain her feeding supplement (ENSURE ENLIVE), thiamine, cyanocobalamin, Vitamin D3, atorvastatin, bisoprolol-hydrochlorothiazide, warfarin, and ramipril.  No orders of the defined types were placed in this encounter.    Follow-up: No Follow-up on file.  [name], MD

## 2017-04-25 NOTE — Assessment & Plan Note (Signed)
No relapse 

## 2017-04-25 NOTE — Assessment & Plan Note (Signed)
stable °

## 2017-04-25 NOTE — Assessment & Plan Note (Signed)
On Rx 

## 2017-04-25 NOTE — Assessment & Plan Note (Signed)
2/18 no relapse

## 2017-04-25 NOTE — Assessment & Plan Note (Signed)
no relapse

## 2017-06-10 ENCOUNTER — Telehealth: Payer: Self-pay | Admitting: Internal Medicine

## 2017-06-10 DIAGNOSIS — Z7901 Long term (current) use of anticoagulants: Secondary | ICD-10-CM

## 2017-06-10 DIAGNOSIS — I482 Chronic atrial fibrillation, unspecified: Secondary | ICD-10-CM

## 2017-06-10 NOTE — Telephone Encounter (Signed)
Copied from [place] [id number]. Topic: General - Call Back - No Documentation >> Jun 10, 2017  9:32 AM Darl Householder, RMA wrote: Reason for CRM: patient is requesting a callback from Dr. Alain Marion or CMA concerning a referral to [facility] center for patient to have INR check due to the price is cheaper than [place], please return pt call

## 2017-06-12 NOTE — Telephone Encounter (Signed)
I am not able to sign the order for some reason.  It is being rejected.  Thank you

## 2017-06-12 NOTE — Telephone Encounter (Signed)
Please advise, I have referral loaded

## 2017-06-13 ENCOUNTER — Ambulatory Visit: Payer: Self-pay | Admitting: General Practice

## 2017-06-13 DIAGNOSIS — Z7901 Long term (current) use of anticoagulants: Secondary | ICD-10-CM | POA: Insufficient documentation

## 2017-06-13 DIAGNOSIS — I482 Chronic atrial fibrillation, unspecified: Secondary | ICD-10-CM

## 2017-06-13 HISTORY — DX: Long term (current) use of anticoagulants: Z79.01

## 2017-06-13 NOTE — Telephone Encounter (Signed)
Pt was in [facility] and had to be d/c before we could signed off. Referral ordered

## 2017-06-24 ENCOUNTER — Telehealth: Payer: Self-pay | Admitting: General Practice

## 2017-06-24 NOTE — Telephone Encounter (Signed)
Information faxed to [facility] with [place] [facility] @ [phone].    Copied from [place] [phone]. Topic: Referral - Request >> Jun 24, 2017 12:56 PM Scherrie Gerlach wrote: Reason for CRM: Verline Lema with [facility] requesting a referral for the pt to go to their office for coum check.  They are requesting fax with the last 2 - 3 inr results.  Fax  [phone]

## 2017-06-26 DIAGNOSIS — Z7901 Long term (current) use of anticoagulants: Secondary | ICD-10-CM | POA: Insufficient documentation

## 2017-06-26 HISTORY — DX: Long term (current) use of anticoagulants: Z79.01

## 2017-09-15 ENCOUNTER — Telehealth: Payer: Self-pay | Admitting: Internal Medicine

## 2017-09-15 NOTE — Telephone Encounter (Signed)
Copied from [place] [id number]. Topic: Quick Communication - See Telephone Encounter >> Sep 15, 2017  9:05 AM Ether Griffins B wrote: CRM for notification. See Telephone encounter for: 09/15/17.  Pt's daughter calling in requesting lab work be done. She is worried about her having the same thing she had last year that had her in the hospital for awhile she states the only symptom she is having now is her eye is red. She hasnt had a fever or any other symptoms. She is hoping lab work can be done then they  come in and see Dr. Alain Marion after  to make sure she doesn't have a bacteria present.

## 2017-09-17 ENCOUNTER — Other Ambulatory Visit (INDEPENDENT_AMBULATORY_CARE_PROVIDER_SITE_OTHER): Payer: Self-pay

## 2017-09-17 ENCOUNTER — Other Ambulatory Visit: Payer: Self-pay

## 2017-09-17 DIAGNOSIS — I330 Acute and subacute infective endocarditis: Secondary | ICD-10-CM

## 2017-09-17 DIAGNOSIS — K529 Noninfective gastroenteritis and colitis, unspecified: Secondary | ICD-10-CM

## 2017-09-17 DIAGNOSIS — I10 Essential (primary) hypertension: Secondary | ICD-10-CM

## 2017-09-17 DIAGNOSIS — B955 Unspecified streptococcus as the cause of diseases classified elsewhere: Secondary | ICD-10-CM

## 2017-09-17 LAB — COMPREHENSIVE METABOLIC PANEL
ALT: 15 U/L (ref 0–35)
AST: 26 U/L (ref 0–37)
Albumin: 3.9 g/dL (ref 3.5–5.2)
Alkaline Phosphatase: 80 U/L (ref 39–117)
BILIRUBIN TOTAL: 0.4 mg/dL (ref 0.2–1.2)
BUN: 21 mg/dL (ref 6–23)
CALCIUM: 9.2 mg/dL (ref 8.4–10.5)
CO2: 26 meq/L (ref 19–32)
Chloride: 105 mEq/L (ref 96–112)
Creatinine, Ser: 1.11 mg/dL (ref 0.40–1.20)
GFR: 50.09 mL/min — AB (ref >60.00)
Glucose, Bld: 100 mg/dL — ABNORMAL HIGH (ref 70–99)
POTASSIUM: 3.3 meq/L — AB (ref 3.5–5.1)
Sodium: 140 mEq/L (ref 135–145)
Total Protein: 7.9 g/dL (ref 6.0–8.3)

## 2017-09-17 LAB — CBC WITH DIFFERENTIAL/PLATELET
BASOS ABS: 0 10*3/uL (ref 0.0–0.1)
Basophils Relative: 0.8 % (ref 0.0–3.0)
Eosinophils Absolute: 0.1 10*3/uL (ref 0.0–0.7)
Eosinophils Relative: 1.9 % (ref 0.0–5.0)
HEMATOCRIT: 36.1 % (ref 36.0–46.0)
Hemoglobin: 12.5 g/dL (ref 12.0–15.0)
LYMPHS PCT: 23.1 % (ref 12.0–46.0)
Lymphs Abs: 1.3 10*3/uL (ref 0.7–4.0)
MCHC: 34.6 g/dL (ref 30.0–36.0)
MCV: 82.2 fl (ref 78.0–100.0)
MONOS PCT: 10.9 % (ref 3.0–12.0)
Monocytes Absolute: 0.6 10*3/uL (ref 0.1–1.0)
NEUTROS ABS: 3.6 10*3/uL (ref 1.4–7.7)
Neutrophils Relative %: 63.3 % (ref 43.0–77.0)
PLATELETS: 252 10*3/uL (ref 150.0–400.0)
RBC: 4.39 Mil/uL (ref 3.87–5.11)
RDW: 13.9 % (ref 11.5–15.5)
WBC: 5.7 10*3/uL (ref 4.0–10.5)

## 2017-09-17 LAB — SEDIMENTATION RATE: Sed Rate: 42 mm/hr — ABNORMAL HIGH (ref 0–30)

## 2017-09-17 NOTE — Telephone Encounter (Signed)
Pt showed up at lab and was informed they would have to wait until appt so correct labs were put in.

## 2017-09-18 ENCOUNTER — Other Ambulatory Visit: Payer: Self-pay | Admitting: Internal Medicine

## 2017-09-18 MED ORDER — POTASSIUM CHLORIDE ER 8 MEQ PO TBCR: 8.0000 meq | EXTENDED_RELEASE_TABLET | Freq: Two times a day (BID) | ORAL | 0 refills | Status: DC

## 2017-09-23 LAB — CULTURE, BLOOD (SINGLE): MICRO NUMBER: 90501352

## 2017-09-29 ENCOUNTER — Telehealth: Payer: Self-pay | Admitting: Internal Medicine

## 2017-09-29 NOTE — Telephone Encounter (Signed)
Daughter notified of labs

## 2017-09-29 NOTE — Telephone Encounter (Signed)
Copied from [place] [phone]. Topic: Quick Communication - Lab Results >> Sep 29, 2017 10:50 AM Robina Ade, Helene Kelp D wrote: Patientdaughter called and would like her mom's lab results given to her, please call patient back, thanks.

## 2018-01-19 ENCOUNTER — Other Ambulatory Visit: Payer: Self-pay | Admitting: Internal Medicine

## 2018-02-10 ENCOUNTER — Other Ambulatory Visit: Payer: Self-pay | Admitting: Internal Medicine

## 2018-04-27 ENCOUNTER — Telehealth: Payer: Self-pay | Admitting: Internal Medicine

## 2018-04-27 ENCOUNTER — Other Ambulatory Visit: Payer: Self-pay | Admitting: Internal Medicine

## 2018-04-27 MED ORDER — ATORVASTATIN CALCIUM 10 MG PO TABS: 10.0000 mg | ORAL_TABLET | Freq: Every day | ORAL | 0 refills | Status: DC

## 2018-04-27 MED ORDER — RAMIPRIL 2.5 MG PO CAPS: 2.5000 mg | ORAL_CAPSULE | Freq: Every day | ORAL | 0 refills | Status: DC

## 2018-04-27 MED ORDER — BISOPROLOL-HYDROCHLOROTHIAZIDE 2.5-6.25 MG PO TABS: ORAL_TABLET | ORAL | 0 refills | Status: DC

## 2018-04-27 NOTE — Telephone Encounter (Signed)
Copied from [place] [phone]. Topic: Quick Communication - Rx Refill/Question >> Apr 27, 2018  1:50 PM Blase Mess A wrote: Medication: bisoprolol-hydrochlorothiazide [facility]) 2.5-6.25 MG tablet [[id number]] , ramipril (ALTACE) 2.5 MG capsule [[id number]] , warfarin (COUMADIN) 2.5 MG tablet [[id number]] , atorvastatin (LIPITOR) 10 MG tablet [[id number]] , b complex vitamins tablet [[id number]] , Cholecalciferol (VITAMIN D3) 2000 units capsule [[id number]] ,   Patient has an appt scheduled on 06/08/18  Has the patient contacted their pharmacy? Yes  (Agent: If no, request that the patient contact the pharmacy for the refill.) (Agent: If yes, when and what did the pharmacy advise?)  Preferred Pharmacy (with phone number or street name): [place], [place] - [place], [id number] [place] [address] Phone: [phone] Fax: [phone]    Agent: Please be advised that RX refills may take up to 3 business days. We ask that you follow-up with your pharmacy.

## 2018-04-27 NOTE — Telephone Encounter (Signed)
Per office policy sent 30 day to local pharmacy until appt on pt BP and cholesterol med .Marland KitchenJohny [name]

## 2018-05-04 ENCOUNTER — Ambulatory Visit (INDEPENDENT_AMBULATORY_CARE_PROVIDER_SITE_OTHER): Payer: Self-pay | Admitting: Internal Medicine

## 2018-05-04 ENCOUNTER — Encounter: Payer: Self-pay | Admitting: Internal Medicine

## 2018-05-04 ENCOUNTER — Other Ambulatory Visit (INDEPENDENT_AMBULATORY_CARE_PROVIDER_SITE_OTHER): Payer: Self-pay

## 2018-05-04 VITALS — BP 132/84 | HR 100 | Temp 98.0°F | Ht 60.0 in | Wt 155.0 lb

## 2018-05-04 DIAGNOSIS — I10 Essential (primary) hypertension: Secondary | ICD-10-CM

## 2018-05-04 DIAGNOSIS — Z23 Encounter for immunization: Secondary | ICD-10-CM

## 2018-05-04 DIAGNOSIS — I5020 Unspecified systolic (congestive) heart failure: Secondary | ICD-10-CM

## 2018-05-04 DIAGNOSIS — R413 Other amnesia: Secondary | ICD-10-CM

## 2018-05-04 LAB — CBC WITH DIFFERENTIAL/PLATELET
BASOS ABS: 0 10*3/uL (ref 0.0–0.1)
Basophils Relative: 0.6 % (ref 0.0–3.0)
Eosinophils Absolute: 0.1 10*3/uL (ref 0.0–0.7)
Eosinophils Relative: 1.1 % (ref 0.0–5.0)
HCT: 36.2 % (ref 36.0–46.0)
Hemoglobin: 12.2 g/dL (ref 12.0–15.0)
Lymphocytes Relative: 18.4 % (ref 12.0–46.0)
Lymphs Abs: 1.1 10*3/uL (ref 0.7–4.0)
MCHC: 33.6 g/dL (ref 30.0–36.0)
MCV: 82.9 fl (ref 78.0–100.0)
MONOS PCT: 10.8 % (ref 3.0–12.0)
Monocytes Absolute: 0.7 10*3/uL (ref 0.1–1.0)
Neutro Abs: 4.2 10*3/uL (ref 1.4–7.7)
Neutrophils Relative %: 69.1 % (ref 43.0–77.0)
Platelets: 244 10*3/uL (ref 150.0–400.0)
RBC: 4.37 Mil/uL (ref 3.87–5.11)
RDW: 14.2 % (ref 11.5–15.5)
WBC: 6.1 10*3/uL (ref 4.0–10.5)

## 2018-05-04 LAB — BASIC METABOLIC PANEL
BUN: 24 mg/dL — ABNORMAL HIGH (ref 6–23)
CO2: 25 mEq/L (ref 19–32)
Calcium: 9.6 mg/dL (ref 8.4–10.5)
Chloride: 104 mEq/L (ref 96–112)
Creatinine, Ser: 1.08 mg/dL (ref 0.40–1.20)
GFR: 51.62 mL/min — ABNORMAL LOW (ref >60.00)
Glucose, Bld: 114 mg/dL — ABNORMAL HIGH (ref 70–99)
Potassium: 3.9 mEq/L (ref 3.5–5.1)
Sodium: 138 mEq/L (ref 135–145)

## 2018-05-04 LAB — URINALYSIS, ROUTINE W REFLEX MICROSCOPIC
Bilirubin Urine: NEGATIVE
HGB URINE DIPSTICK: NEGATIVE
Ketones, ur: NEGATIVE
Nitrite: NEGATIVE
Specific Gravity, Urine: 1.01 (ref 1.000–1.030)
TOTAL PROTEIN, URINE-UPE24: NEGATIVE
UROBILINOGEN UA: 0.2 (ref 0.0–1.0)
Urine Glucose: NEGATIVE
pH: 6 (ref 5.0–8.0)

## 2018-05-04 LAB — HEPATIC FUNCTION PANEL
ALT: 15 U/L (ref 0–35)
AST: 23 U/L (ref 0–37)
Albumin: 4 g/dL (ref 3.5–5.2)
Alkaline Phosphatase: 79 U/L (ref 39–117)
Bilirubin, Direct: 0.1 mg/dL (ref 0.0–0.3)
Total Bilirubin: 0.6 mg/dL (ref 0.2–1.2)
Total Protein: 7.7 g/dL (ref 6.0–8.3)

## 2018-05-04 LAB — LIPID PANEL
Cholesterol: 169 mg/dL (ref 0–200)
HDL: 46 mg/dL (ref >39.00)
LDL Cholesterol: 91 mg/dL (ref 0–99)
NonHDL: 122.51
Total CHOL/HDL Ratio: 4
Triglycerides: 158 mg/dL — ABNORMAL HIGH (ref 0.0–149.0)
VLDL: 31.6 mg/dL (ref 0.0–40.0)

## 2018-05-04 LAB — TSH: TSH: 1.52 u[IU]/mL (ref 0.35–4.50)

## 2018-05-04 MED ORDER — BISOPROLOL-HYDROCHLOROTHIAZIDE 2.5-6.25 MG PO TABS: ORAL_TABLET | ORAL | 3 refills | Status: DC

## 2018-05-04 MED ORDER — ATORVASTATIN CALCIUM 10 MG PO TABS: 10.0000 mg | ORAL_TABLET | Freq: Every day | ORAL | 3 refills | Status: DC

## 2018-05-04 MED ORDER — DONEPEZIL HCL 5 MG PO TABS: 5.0000 mg | ORAL_TABLET | Freq: Every day | ORAL | 3 refills | Status: DC

## 2018-05-04 MED ORDER — WARFARIN SODIUM 2.5 MG PO TABS: 1.2500 mg | ORAL_TABLET | ORAL | 3 refills | Status: DC

## 2018-05-04 MED ORDER — RAMIPRIL 2.5 MG PO CAPS: 2.5000 mg | ORAL_CAPSULE | Freq: Every day | ORAL | 3 refills | Status: DC

## 2018-05-04 NOTE — Progress Notes (Signed)
Subjective:  Patient ID: Alexis [name], female    DOB: 02/28/1934  Age: 84 y.o. MRN: [id number]  CC: No chief complaint on file.   HPI Alexis [name] presents for CAD, anticoagulation, dyslipidemia f/u  Outpatient Medications Prior to Visit  Medication Sig Dispense Refill  . atorvastatin (LIPITOR) 10 MG tablet Take 1 tablet (10 mg total) by mouth daily. Must keep schedule appt in January for future refills 30 tablet 0  . b complex vitamins tablet Take 1 tablet by mouth daily. 100 tablet 3  . bisoprolol-hydrochlorothiazide (ZIAC) 2.5-6.25 MG tablet Take 1/2 half of tablet by mouth once a day Must keep schedule appt in January for future refill 15 tablet 0  . Cholecalciferol (VITAMIN D3) 2000 units capsule Take 1 capsule (2,000 Units total) by mouth daily. 100 capsule 3  . feeding supplement, ENSURE ENLIVE, (ENSURE ENLIVE) LIQD Take 237 mLs by mouth 2 (two) times daily between meals. 237 mL 12  . potassium chloride (KLOR-CON) 8 MEQ tablet Take 1 tablet (8 mEq total) by mouth 2 (two) times daily. 60 tablet 0  . ramipril (ALTACE) 2.5 MG capsule Take 1 capsule (2.5 mg total) by mouth daily. Must keep schedule appt in January for future refill 30 capsule 0  . warfarin (COUMADIN) 2.5 MG tablet Take 0.5-1 tablets (1.25-2.5 mg total) by mouth See admin instructions. 1.25 mg in the evening on Sun/Tues/Thurs/Sat and 2.5 mg on Mon/Wed/Fri 90 tablet 3   No facility-administered medications prior to visit.     ROS: Review of Systems  Constitutional: Negative for activity change, appetite change, chills, diaphoresis, fatigue, fever and unexpected weight change.  HENT: Negative for congestion, dental problem, ear pain, hearing loss, mouth sores, postnasal drip, sinus pressure, sneezing, sore throat and voice change.   Eyes: Negative for pain and visual disturbance.  Respiratory: Negative for cough, chest tightness, wheezing and stridor.   Cardiovascular: Negative for chest pain,  palpitations and leg swelling.  Gastrointestinal: Negative for abdominal distention, abdominal pain, blood in stool, nausea, rectal pain and vomiting.  Genitourinary: Negative for decreased urine volume, difficulty urinating, dysuria, frequency, hematuria, menstrual problem, vaginal bleeding, vaginal discharge and vaginal pain.  Musculoskeletal: Positive for arthralgias. Negative for back pain, gait problem, joint swelling and neck pain.  Skin: Negative for color change, pallor, rash and wound.  Neurological: Negative for dizziness, tremors, syncope, speech difficulty, weakness, light-headedness, numbness and headaches.  Hematological: Negative for adenopathy.  Psychiatric/Behavioral: Positive for decreased concentration and dysphoric mood. Negative for behavioral problems, confusion, hallucinations, sleep disturbance and suicidal ideas. The patient is not nervous/anxious and is not hyperactive.     Objective:  BP 132/84 (BP Location: Right Arm, Patient Position: Sitting, Cuff Size: Normal)   Pulse 100   Temp 98 F (36.7 C) (Oral)   Ht 5' (1.524 m)   Wt 155 lb (70.3 kg)   SpO2 98%   BMI 30.27 kg/m   BP Readings from Last 3 Encounters:  05/04/18 132/84  04/25/17 116/78  12/03/16 118/78    Wt Readings from Last 3 Encounters:  05/04/18 155 lb (70.3 kg)  04/25/17 154 lb (69.9 kg)  12/03/16 145 lb (65.8 kg)    Physical Exam  Constitutional: She appears well-developed. No distress.  HENT:  Head: Normocephalic.  Right Ear: External ear normal.  Left Ear: External ear normal.  Nose: Nose normal.  Mouth/Throat: Oropharynx is clear and moist.  Eyes: Pupils are equal, round, and reactive to light. Conjunctivae are normal. Right eye exhibits no discharge. Left eye  exhibits no discharge.  Neck: Normal range of motion. Neck supple. No JVD present. No tracheal deviation present. No thyromegaly present.  Cardiovascular: Normal rate and regular rhythm.  Murmur heard. Pulmonary/Chest: No  stridor. No respiratory distress. She has no wheezes.  Abdominal: Soft. Bowel sounds are normal. She exhibits no distension and no mass. There is no tenderness. There is no rebound and no guarding.  Musculoskeletal: She exhibits no edema or tenderness.  Lymphadenopathy:    She has no cervical adenopathy.  Neurological: She displays normal reflexes. No cranial nerve deficit. She exhibits normal muscle tone. Coordination normal.  Skin: No rash noted. No erythema.  Psychiatric: She has a normal mood and affect. Her behavior is normal. Judgment and thought content normal.  a/o/c Serial 7's WNL  Lab Results  Component Value Date   WBC 5.7 09/17/2017   HGB 12.5 09/17/2017   HCT 36.1 09/17/2017   PLT 252.0 09/17/2017   GLUCOSE 100 (H) 09/17/2017   CHOL 138 07/13/2016   TRIG 105 07/13/2016   HDL 31 (L) 07/13/2016   LDLCALC 86 07/13/2016   ALT 15 09/17/2017   AST 26 09/17/2017   NA 140 09/17/2017   K 3.3 (L) 09/17/2017   CL 105 09/17/2017   CREATININE 1.11 09/17/2017   BUN 21 09/17/2017   CO2 26 09/17/2017   TSH 0.518 07/13/2016   INR 2.0 (H) 04/25/2017   HGBA1C 5.8 (H) 07/13/2016    Mr Mra Head Wo Contrast  Result Date: 07/14/2016 CLINICAL DATA:  Altered mental status. EXAM: MRI HEAD WITHOUT CONTRAST MRA HEAD WITHOUT CONTRAST TECHNIQUE: Multiplanar, multiecho pulse sequences of the brain and surrounding structures were obtained without intravenous contrast. Angiographic images of the head were obtained using MRA technique without contrast. COMPARISON:  Same study from yesterday FINDINGS: MRI HEAD FINDINGS Brain: Unchanged DWI hyperintensity with isodense ADC map in the bilateral inferior thalamus and right upper midbrain. No new abnormality to suggest acute infarct, hemorrhage, hydrocephalus, or mass effect. Extensive motion degradation in this altercation. Subtle findings could be obscured. Vascular: Arterial findings below. Unremarkable dural venous sinuses and deep venous flow voids.  Skull and upper cervical spine: Negative Sinuses/Orbits: Cataract resection on the left. MRA HEAD FINDINGS The carotid and vertebral arteries are distorted by motion but are widely patent. Non degraded basilar has a smooth widely patent appearance. There is a large posterior communicating artery on the right with fetal type PCA anatomy. On source images there is suggestion of a top basilar infundibulum from hypoplastic P1 or perforator, relationship to the thalamus/midbrain findings uncertain as the associated vessel is below the resolution of MRA. Mild for age atherosclerotic irregularity of bilateral MCA branches. No major branch occlusion. Negative for aneurysm. IMPRESSION: 1. Motion degraded brain MRI with stable findings in the bilateral thalamus and right midbrain. No revision to prior differential considerations. 2. No structural explanation for partial seizures from the left temporal lobe. 3. Motion degraded but better quality MRA. No major vessel occlusion or flow limiting stenosis. Electronically Signed   By: Monte Fantasia M.D.   On: 07/14/2016 13:06   Mr Brain Wo Contrast  Result Date: 07/14/2016 CLINICAL DATA:  Altered mental status. EXAM: MRI HEAD WITHOUT CONTRAST MRA HEAD WITHOUT CONTRAST TECHNIQUE: Multiplanar, multiecho pulse sequences of the brain and surrounding structures were obtained without intravenous contrast. Angiographic images of the head were obtained using MRA technique without contrast. COMPARISON:  Same study from yesterday FINDINGS: MRI HEAD FINDINGS Brain: Unchanged DWI hyperintensity with isodense ADC map in the bilateral  inferior thalamus and right upper midbrain. No new abnormality to suggest acute infarct, hemorrhage, hydrocephalus, or mass effect. Extensive motion degradation in this altercation. Subtle findings could be obscured. Vascular: Arterial findings below. Unremarkable dural venous sinuses and deep venous flow voids. Skull and upper cervical spine: Negative  Sinuses/Orbits: Cataract resection on the left. MRA HEAD FINDINGS The carotid and vertebral arteries are distorted by motion but are widely patent. Non degraded basilar has a smooth widely patent appearance. There is a large posterior communicating artery on the right with fetal type PCA anatomy. On source images there is suggestion of a top basilar infundibulum from hypoplastic P1 or perforator, relationship to the thalamus/midbrain findings uncertain as the associated vessel is below the resolution of MRA. Mild for age atherosclerotic irregularity of bilateral MCA branches. No major branch occlusion. Negative for aneurysm. IMPRESSION: 1. Motion degraded brain MRI with stable findings in the bilateral thalamus and right midbrain. No revision to prior differential considerations. 2. No structural explanation for partial seizures from the left temporal lobe. 3. Motion degraded but better quality MRA. No major vessel occlusion or flow limiting stenosis. Electronically Signed   By: Monte Fantasia M.D.   On: 07/14/2016 13:06   Mr Brain Wo Contrast  Result Date: 07/13/2016 CLINICAL DATA:  Generalized weakness. Altered mental status. Recent mitral valve endocarditis. EXAM: MRI HEAD WITHOUT CONTRAST MRA HEAD WITHOUT CONTRAST TECHNIQUE: Multiplanar, multiecho pulse sequences of the brain and surrounding structures were obtained without intravenous contrast. Angiographic images of the head were obtained using MRA technique without contrast. COMPARISON:  Yesterday FINDINGS: MRI HEAD FINDINGS Brain: Hyperintense DWI signal along the lower thalamus bilaterally extending into the upper right midbrain right of the cerebral aquaduct. These areas are isointense on ADC map. The deep gray nuclei corpus callosum have normal signal and appearance. The mamillary bodies are not well covered due to slice selection and motion artifact. Mild signal abnormality in the cerebral white matter and dorsal pons, likely chronic microvascular  ischemia. Normal brain volume. No hemorrhage or hydrocephalus. Negative for mass. Vascular: Major flow voids are preserved, including the deep veins and basilar. Skull and upper cervical spine: No marrow lesion noted. Sinuses/Orbits: Left cataract resection. MRA HEAD FINDINGS Extremely motion degraded exam. Poorly visualized proximal right P1 segment is attributed to fetal type circulation based on contemporaneous T2 weighted acquisition. There is likely bilateral atherosclerotic irregularity. No proximal occlusion noted. IMPRESSION: 1. Abnormal signal in the lower thalami and right midbrain favor subacute infarct from thalamoperforators or artery of Percheron. Would correlate for symptoms/risk factors of Wernicke's, which can also affect these structures. 2. Extremely limited intracranial MRA due to more motion artifact. No proximal occlusion noted. Electronically Signed   By: Monte Fantasia M.D.   On: 07/13/2016 11:05   Mr Jodene Nam Head/brain QZ Cm  Result Date: 07/13/2016 CLINICAL DATA:  Generalized weakness. Altered mental status. Recent mitral valve endocarditis. EXAM: MRI HEAD WITHOUT CONTRAST MRA HEAD WITHOUT CONTRAST TECHNIQUE: Multiplanar, multiecho pulse sequences of the brain and surrounding structures were obtained without intravenous contrast. Angiographic images of the head were obtained using MRA technique without contrast. COMPARISON:  Yesterday FINDINGS: MRI HEAD FINDINGS Brain: Hyperintense DWI signal along the lower thalamus bilaterally extending into the upper right midbrain right of the cerebral aquaduct. These areas are isointense on ADC map. The deep gray nuclei corpus callosum have normal signal and appearance. The mamillary bodies are not well covered due to slice selection and motion artifact. Mild signal abnormality in the cerebral white matter and dorsal  pons, likely chronic microvascular ischemia. Normal brain volume. No hemorrhage or hydrocephalus. Negative for mass. Vascular: Major  flow voids are preserved, including the deep veins and basilar. Skull and upper cervical spine: No marrow lesion noted. Sinuses/Orbits: Left cataract resection. MRA HEAD FINDINGS Extremely motion degraded exam. Poorly visualized proximal right P1 segment is attributed to fetal type circulation based on contemporaneous T2 weighted acquisition. There is likely bilateral atherosclerotic irregularity. No proximal occlusion noted. IMPRESSION: 1. Abnormal signal in the lower thalami and right midbrain favor subacute infarct from thalamoperforators or artery of Percheron. Would correlate for symptoms/risk factors of Wernicke's, which can also affect these structures. 2. Extremely limited intracranial MRA due to more motion artifact. No proximal occlusion noted. Electronically Signed   By: Monte Fantasia M.D.   On: 07/13/2016 11:05   Vas US Carotid (at [facility] Only)  Result Date: 07/17/2016                     *[facility]*                         [address], [place] [address]                            [phone] ------------------------------------------------------------------- Noninvasive Vascular Lab Carotid Duplex Study Patient:    Najat, Olazabal MR #:       [id number] Study Date: 07/13/2016 Gender:     F Age:        49 Height:     152.4 cm Weight:     65.8 kg BSA:        1.69 m^2 Pt. Status: Room:       5M10C  SONOGRAPHER  Sharion Dove, RVS  ATTENDING    Regalado, Belkys A  ORDERING     Elgergawy, Dawood S  REFERRING    Elgergawy, Silver Huguenin Reports also to: ------------------------------------------------------------------- History and indications: Indications Altered mental status. Slurred speech.  History Diagnostic evaluation. No prior study is available for comparison.  ------------------------------------------------------------------- Study information: Study status:  Routine.  Procedure:  The right CCA,  right ECA, right ICA, right vertebral, left CCA, left ECA, left ICA, and left vertebral arteries were examined. A vascular evaluation was performed with the patient in the supine position. Image quality was adequate.    Carotid duplex study.     Carotid Duplex exam including 2D imaging, color and spectral Doppler were performed on the extracranial carotid and vertebral arteries using standard established protocols.  Birthdate:  Patient birthdate: 03-15-1934. Age:  Patient is 84 yr old.  Sex:  Gender: female.  Height: Height: 152.4 cm. Height: 60 in.  Weight:  Weight: 65.8 kg. Weight: 144.7 lb.  Body mass index:  BMI: 28.3 kg/m^2.  Body surface area:   BSA: 1.69 m^2.  Study date:  Study date: 07/13/2016. Study time: 10:50 AM.  Location:  Vascular laboratory.  Patient status: Inpatient. Arterial flow: +--------------------+---------+--------+ !Location            !V sys    !V ed    ! +--------------------+---------+--------+ !Right CCA - proximal!117 cm/s !16 cm/s ! +--------------------+---------+--------+ !Right CCA -  distal  !88 cm/s  !16 cm/s ! +--------------------+---------+--------+ !Right ECA           !-124 cm/s!-3 cm/s ! +--------------------+---------+--------+ !Right ICA - proximal!-78 cm/s !-20 cm/s! +--------------------+---------+--------+ !Right ICA - distal  !-54 cm/s !-12 cm/s! +--------------------+---------+--------+ !Right vertebral     !78 cm/s  !19 cm/s ! +--------------------+---------+--------+ !Left CCA - proximal !51 cm/s  !10 cm/s ! +--------------------+---------+--------+ !Left CCA - distal   !68 cm/s  !9 cm/s  ! +--------------------+---------+--------+ !Left ECA            !-59 cm/s !-4 cm/s ! +--------------------+---------+--------+ !Left ICA - proximal !-69 cm/s !-17 cm/s! +--------------------+---------+--------+ !Left ICA - distal   !-58 cm/s !-15 cm/s! +--------------------+---------+--------+ !Left vertebral      !-33 cm/s !-5 cm/s ! +--------------------+---------+--------+  ------------------------------------------------------------------- Summary: Bilateral: mild calcific plaque origin ICA with acoustic shadowing. 1-39% ICA stenosis. Vertebral artery flow is antegrade. Other specific details can be found in the table(s) above. Prepared and Electronically Authenticated by Antony Contras MD 2018-02-21T14:10:21   Assessment & Plan:   There are no diagnoses linked to this encounter.   No orders of the defined types were placed in this encounter.    Follow-up: No follow-ups on file.  [name], MD

## 2018-05-04 NOTE — Assessment & Plan Note (Signed)
Worse Start Aricept  B complex qd

## 2018-06-08 ENCOUNTER — Ambulatory Visit: Payer: Self-pay | Admitting: Internal Medicine

## 2018-07-31 ENCOUNTER — Telehealth: Payer: Self-pay

## 2018-07-31 NOTE — Telephone Encounter (Signed)
Please advise, I do not see anything about placing referral   Copied from [place] [id number]. Topic: Referral - Request for Referral >> Jul 29, 2018  4:25 PM Selinda Flavin B, [place] wrote: Has patient seen PCP for this complaint? Yes.   *If NO, is insurance requiring patient see PCP for this issue before PCP can refer them? Referral for which specialty: Cardiology Preferred provider/office:  Reason for referral: Patient's daughter, Tivis Ringer, calling and states that a referral was supposed to be placed and when she called the cardiologist, they did not have a referral on file.

## 2018-08-05 ENCOUNTER — Other Ambulatory Visit: Payer: Self-pay | Admitting: Internal Medicine

## 2018-08-05 DIAGNOSIS — I5020 Unspecified systolic (congestive) heart failure: Secondary | ICD-10-CM

## 2018-08-05 NOTE — Telephone Encounter (Signed)
As I recall [name] asked for the names, did not ask for a referral. OK I'll place a ref. Thx

## 2018-08-05 NOTE — Telephone Encounter (Signed)
Tried calling patient but no answer and no voicemail

## 2018-08-13 ENCOUNTER — Telehealth: Payer: Self-pay | Admitting: Internal Medicine

## 2018-08-13 NOTE — Telephone Encounter (Signed)
Copied from [place] [id number]. Topic: Quick Communication - See Telephone Encounter >> Aug 13, 2018  3:38 PM Blase Mess A wrote: CRM for notification. See Telephone encounter for: 08/13/18.  Patient's daughter is calling to see if Dr. Alain Marion can put a referral in for the patient. [facility] Dr. Bernadene Bell will not see the patient until 10/09/18 due to corona. Can a referral be placed for a sooner appt? Please advise [phone]

## 2018-08-14 NOTE — Telephone Encounter (Signed)
pts daughter notified that the referral was already placed and they can call back to [facility] in a few weeks to see if the status changes

## 2018-08-28 ENCOUNTER — Other Ambulatory Visit: Payer: Self-pay | Admitting: Internal Medicine

## 2018-08-28 NOTE — Telephone Encounter (Signed)
Lorre Nick,   I don't manage this patient.  Dr. Alain Marion will need to re-fill this.  Thanks! Jenny Reichmann

## 2018-08-28 NOTE — Telephone Encounter (Signed)
Will forward to Dr. Alain Marion for approval../lmb

## 2018-10-20 ENCOUNTER — Telehealth (HOSPITAL_COMMUNITY): Payer: Self-pay | Admitting: Vascular Surgery

## 2018-10-20 ENCOUNTER — Telehealth (HOSPITAL_COMMUNITY): Payer: Self-pay

## 2018-10-20 NOTE — Telephone Encounter (Signed)
Pt does not seem to have HF, per chart last echo in 2018 EF 55-60%, ref states for CAD and SOB, pt needs to be seen by general cards

## 2018-10-20 NOTE — Telephone Encounter (Signed)
Spoke to [facility] @ Dr. Alain Marion office. Pt does not fit requirements to be seen in HF clinic, pt will need to be referred to general Cardiology

## 2018-10-20 NOTE — Telephone Encounter (Signed)
Patients daughter called requesting a new patient appt at the direction of her their primary doctor, dr Alain Marion.  Message routed to Dr. Gillermina Hu scheduler.

## 2018-10-21 NOTE — Addendum Note (Signed)
Addended by: Karren Cobble on: 10/21/2018 01:16 PM   Modules accepted: Orders

## 2018-10-23 ENCOUNTER — Telehealth (HOSPITAL_COMMUNITY): Payer: Self-pay | Admitting: Vascular Surgery

## 2018-10-23 NOTE — Telephone Encounter (Signed)
Pt daughter called office to make new chf appt for her mother,pt daughter was very upset that pt has not been scheduled, pt was referred by Dr. Alain Marion, pt does not have HF per Kevan Rosebush, I called [facility] office 5/26 spoke to referral coorrdinator Stanton Kidney told her that pt did not fit the criteria  for HF clinic and to please contact pt and pt will need to be referred to [facility] . Pt was not contacted.

## 2018-10-30 NOTE — Addendum Note (Signed)
Addended by: Karren Cobble on: 10/30/2018 11:14 AM   Modules accepted: Orders

## 2018-11-05 NOTE — Progress Notes (Signed)
Cardiology Office Note:    Date:  11/06/2018   ID:  Alexis [name], DOB 11/08/1933, MRN [id number]  PCP:  [name], MD  Cardiologist:  Shirlee More, MD    Referring MD: [name], MD    ASSESSMENT:    1. Chronic congestive heart failure, unspecified heart failure type ([place])   2. Permanent atrial fibrillation   3. Long term current use of anticoagulant therapy   4. S/P AVR (aortic valve replacement)   5. S/P mitral valve repair   6. Hypertensive heart disease with congestive heart failure, unspecified heart failure type ([place])    PLAN:    In order of problems listed above:  1. Heart failure chronic decompensated fluid overloaded initiate a loop diuretic check proBNP level renal function potassium and echocardiogram for valve dysfunction 2. Atrial fibrillation stable rate controlled continue beta-blocker 3. Continue warfarin 4. She has a bioprosthetic aVR greater than 10 years at risk for dysfunction check echocardiogram.  Requested a copy of her surgical note for identification of the valve type to aid in interpreting her echocardiogram 5. At risk for progressive mitral valve dysfunction check echocardiogram 6. BP is marginal today borderline low will discontinue ACE inhibitor initiating a loop diuretic   Next appointment: 4 weeks   Medication Adjustments/Labs and Tests Ordered: Current medicines are reviewed at length with the patient today.  Concerns regarding medicines are outlined above.  No orders of the defined types were placed in this encounter.  No orders of the defined types were placed in this encounter.   No chief complaint on file.   History of Present Illness:    Alexis [name] is a 85 y.o. female with a hx of CVA, permanent atrial fibrillation on Coumadin, CAD s/p CABG in 2009, and also a bioprosthetic aortic valve replacement and mitral valve repair in 2009 presented with recurrent fever and found by PCP to have positive  blood culture and endocarditis treated successfully with antibiotic therapy.. TEE showed small vegetation on mitral valve recommended IV abx for 6 weeks. Cardiology consulted for afib with prolonged pauses up to 2.65 secs  last seen as an in patient 06/26/2016.  She was treated with IV antibiotics for streptococcal endocarditis.  Since the Christmas holiday she has not done well and has a progressive pattern of exertional shortness of breath she really is short of breath with any activity now even at activities like making the bed in the morning she is sleeping on 2 pillows but does not wake up short of breath and she is unaware she had peripheral edema no chest pain palpitation or syncope.  Her daughter is present she has no background history of heart failure.  She is now more than 10 years remote with tissue AVR is at risk for valve dysfunction heart failure.  I will place her on a low-dose of a diuretic and for the time being stop her ACE inhibitor with borderline blood pressure.  She will check labs including renal function potassium and proBNP and echocardiogram as ordered I will see her back in the office in 4 weeks to assess her response and to judge valve function.  At this time I do not think she requires an ischemia evaluation.  She has had no fever or chills no cough or bronchospasm no GI or GU bleeding Past Medical History:  Diagnosis Date  . Abnormal brain MRI   . Adjustment disorder with mixed anxiety and depressed mood 07/09/2016  . Anxiety   . Atrial  fibrillation ([place])   . CAD (coronary artery disease) of artery bypass graft   . Cerebrovascular disease   . Chronic atrial fibrillation 05/31/2008   Atrial Fibrillation  . Congestive heart failure ([place]) 05/31/2008   2011 - resolved s/p CABG and valve replacement Altace   . CONSTIPATION 05/31/2008   Qualifier: Diagnosis of  By: Doralee Albino    . Coronary atherosclerosis 05/31/2008   S/p CABG 2011   Coronary Artery Disease  10/1 IMO update   . CVA (cerebral vascular accident) ([place]) 05/2000   "memory issues, dizziness since" (07/08/2016  . Depression   . Dizzinesses    "often since [facility] 2009" (07/08/2016)  . Eczema 06/06/2016   1/18 back  . Encounter for monitoring Coumadin therapy 03/30/2012   2011   . Endocarditis dx'd 05/2016  . Essential hypertension 05/31/2008   Chronic     . Fever 12/03/2016   2/18 h/o Strep viridance bacteremia in 2/18. PICC line is out.  We may need to remove her 2 remaining teeth  . Foot fracture, left    "no OR; from fall"  . Gastroenteritis 06/20/2014   Likely viral 1/16   . GERD (gastroesophageal reflux disease)    hx  . High cholesterol   . History of blood transfusion ~ 1960  . Hypertension   . Long term current use of anticoagulant therapy 06/13/2017  . Long-term (current) use of anticoagulants, INR goal 2.0-3.0 06/26/2017  . Medication reaction 07/08/2016  . Memory loss 08/16/2013   3/15 mild 2019 Worse Start Aricept  B complex qd  . Mitral valve replaced 07/08/2016  . Myalgia 05/31/2008   Qualifier: Diagnosis of  By: Doralee Albino    . Orthostatic hypotension 06/20/2014   Likely due to viral gastroenteritis 1/16 Hold Ramipril and Spironolactone x 3 days Labs   . Other chest pain 05/31/2008   1/18 L scapula  MSK after raking leaves No CP now  Atypical Chest Pain  . Pneumonia 1-2 X  . Polyarthritis rheumatica ([place])   . 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([place])   . Seizures ([place])    2/18 abn EEG ???thalamic CVA - somnolent On Keppra  . Shoulder blade pain 06/06/2016   1/18 MSK  . 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 Stop date for antibiotic treatment was March 12. Due to multiple reactions and borderline renal function she is now on vancomycin and  genta    Past Surgical History:  Procedure Laterality Date  . AORTIC VALVE REPLACEMENT  03/2008  . CARDIAC VALVE REPLACEMENT    . CATARACT EXTRACTION W/ INTRAOCULAR LENS IMPLANT Left   . CORONARY ANGIOPLASTY  2009  . CORONARY ARTERY BYPASS GRAFT  03/2008  . MITRAL VALVE REPAIR (MV)/CORONARY ARTERY BYPASS GRAFTING (CABG)  03/2008   2009 [facility]  . TEE WITHOUT CARDIOVERSION N/A 06/26/2016   Procedure: TRANSESOPHAGEAL ECHOCARDIOGRAM (TEE);  Surgeon: Josue Hector, MD;  Location: [facility] ENDOSCOPY;  Service: Cardiovascular;  Laterality: N/A;    Current Medications: Current Meds  Medication Sig  . acetaminophen (TYLENOL) 500 MG tablet Take 500 mg by mouth every 6 (six) hours as needed.  [name] atorvastatin (LIPITOR) 10 MG tablet Take 1 tablet (10 mg total) by mouth daily.  [name] b complex vitamins tablet Take 1 tablet by mouth daily.  [name]  bisoprolol-hydrochlorothiazide (ZIAC) 2.5-6.25 MG tablet Take 1/2 tablet by mouth daily  . Cholecalciferol (VITAMIN D3) 2000 units capsule Take 1 capsule (2,000 Units total) by mouth daily.  [name] donepezil (ARICEPT) 5 MG tablet Take 1 tablet (5 mg total) by mouth at bedtime.  [name] loratadine (CLARITIN) 10 MG tablet Take 10 mg by mouth daily as needed for allergies.  [name] warfarin (COUMADIN) 2.5 MG tablet Take 2.5 mg by mouth as directed. Takes 2.5mg  on Mon, Wed, Fri, and Sun.  Takes 1.25mg  on Tues, Thurs, and Sat  . [DISCONTINUED] ramipril (ALTACE) 2.5 MG capsule Take 1 capsule (2.5 mg total) by mouth daily.     Allergies:   Ceftriaxone and Hydralazine hcl   Social History   Socioeconomic History  . Marital status: Widowed    Spouse name: Not on file  . Number of children: Not on file  . Years of education: Not on file  . Highest education level: Not on file  Occupational History  . Not on file  Social Needs  . Financial resource strain: Not on file  . Food insecurity    Worry: Not on file    Inability: Not on file  . Transportation needs    Medical: Not  on file    Non-medical: Not on file  Tobacco Use  . Smoking status: Never Smoker  . Smokeless tobacco: Never Used  Substance and Sexual Activity  . Alcohol use: No  . Drug use: No  . Sexual activity: Not on file  Lifestyle  . Physical activity    Days per week: Not on file    Minutes per session: Not on file  . Stress: Not on file  Relationships  . Social Herbalist on phone: Not on file    Gets together: Not on file    Attends religious service: Not on file    Active member of club or organization: Not on file    Attends meetings of clubs or organizations: Not on file    Relationship status: Not on file  Other Topics Concern  . Not on file  Social History Narrative  . Not on file     Family History: The patient's family history includes Lung cancer in her brother; Stroke in her maternal grandmother. There is no history of CAD. ROS:   Please see the history of present illness.    All other systems reviewed and are negative.  EKGs/Labs/Other Studies Reviewed:    The following studies were reviewed today:  EKG:  EKG ordered today and personally reviewed.  The ekg ordered today demonstrates rate controlled atrial fibrillation  Recent Labs: 05/04/2018: ALT 15; BUN 24; Creatinine, Ser 1.08; Hemoglobin 12.2; Platelets 244.0; Potassium 3.9; Sodium 138; TSH 1.52  Recent Lipid Panel    Component Value Date/Time   CHOL 169 05/04/2018 1527   TRIG 158.0 (H) 05/04/2018 1527   HDL 46.00 05/04/2018 1527   CHOLHDL 4 05/04/2018 1527   VLDL 31.6 05/04/2018 1527   LDLCALC 91 05/04/2018 1527    Physical Exam:    VS:  BP 116/78 (BP Location: Left Arm, Patient Position: Sitting, Cuff Size: Normal)   Pulse 73   Temp (!) 97.2 F (36.2 C)   Ht 4\' 10"  (1.473 m)   Wt 143 lb 6.4 oz (65 kg)   SpO2 99%   BMI 29.97 kg/m     Wt Readings from Last 3 Encounters:  11/06/18 143 lb 6.4 oz (65 kg)  05/04/18 155 lb (70.3 kg)  04/25/17 154 lb (69.9 kg)     GEN:  Well  nourished, well developed in no acute distress HEENT: Normal NECK: No JVD; No carotid bruits LYMPHATICS: No lymphadenopathy CARDIAC: Irregular irregular rhythm S1 is soft no S3 grade 1/6 to 2/6 aortic outflow murmur no aortic regurgitation RRR, no murmurs, rubs, gallops RESPIRATORY:  Clear to auscultation without rales, wheezing or rhonchi  ABDOMEN: Soft, non-tender, non-distended MUSCULOSKELETAL: 2+ bilateral ankle to knee edema; No deformity  SKIN: Warm and dry NEUROLOGIC:  Alert and oriented x 3 PSYCHIATRIC:  Normal affect    Signed, Shirlee More, MD  11/06/2018 3:23 PM    [facility]

## 2018-11-06 ENCOUNTER — Ambulatory Visit (INDEPENDENT_AMBULATORY_CARE_PROVIDER_SITE_OTHER): Payer: Self-pay | Admitting: Cardiology

## 2018-11-06 ENCOUNTER — Other Ambulatory Visit: Payer: Self-pay

## 2018-11-06 VITALS — BP 116/78 | HR 73 | Temp 97.2°F | Ht <= 58 in | Wt 143.4 lb

## 2018-11-06 DIAGNOSIS — I110 Hypertensive heart disease with heart failure: Secondary | ICD-10-CM

## 2018-11-06 DIAGNOSIS — I4821 Permanent atrial fibrillation: Secondary | ICD-10-CM

## 2018-11-06 DIAGNOSIS — Z952 Presence of prosthetic heart valve: Secondary | ICD-10-CM

## 2018-11-06 DIAGNOSIS — Z7901 Long term (current) use of anticoagulants: Secondary | ICD-10-CM

## 2018-11-06 DIAGNOSIS — Z9889 Other specified postprocedural states: Secondary | ICD-10-CM

## 2018-11-06 DIAGNOSIS — I509 Heart failure, unspecified: Secondary | ICD-10-CM

## 2018-11-06 MED ORDER — FUROSEMIDE 20 MG PO TABS: 20.0000 mg | ORAL_TABLET | Freq: Every day | ORAL | 3 refills | Status: DC

## 2018-11-06 NOTE — Patient Instructions (Signed)
Medication Instructions:  Your physician has recommended you make the following change in your medication:  STOP: Ramipril  START: Furosemide 20 mg (1 tab) daily  If you need a refill on your cardiac medications before your next appointment, please call your pharmacy.   Lab work: Your physician recommends that you return for lab work in: [place]  If you have labs (blood work) drawn today and your tests are completely normal, you will receive your results only by: [name] MyChart Message (if you have MyChart) OR . A paper copy in the mail If you have any lab test that is abnormal or we need to change your treatment, we will call you to review the results.  Testing/Procedures: Your physician has requested that you have an echocardiogram. Echocardiography is a painless test that uses sound waves to create images of your heart. It provides your doctor with information about the size and shape of your heart and how well your heart's chambers and valves are working. This procedure takes approximately one hour. There are no restrictions for this procedure.    Follow-Up: At [facility], you and your health needs are our priority.  As part of our continuing mission to provide you with exceptional heart care, we have created designated Provider Care Teams.  These Care Teams include your primary Cardiologist (physician) and Advanced Practice Providers (APPs -  Physician Assistants and Nurse Practitioners) who all work together to provide you with the care you need, when you need it. You will need a follow up appointment in 4 weeks.  Any Other Special Instructions Will Be Listed Below (If Applicable).   Furosemide tablets What is this medicine? FUROSEMIDE (fyoor [place] se mide) is a diuretic. It helps you make more urine and to lose salt and excess water from your body. This medicine is used to treat high blood pressure, and edema or swelling from heart, kidney, or liver disease. This medicine  may be used for other purposes; ask your health care provider or pharmacist if you have questions. COMMON BRAND NAME(S): Active-Medicated Specimen Kit, Delone, Diuscreen, Lasix, RX Specimen Collection Kit, Specimen Collection Kit, URINX Medicated Specimen Collection What should I tell my health care provider before I take this medicine? They need to know if you have any of these conditions: -abnormal blood electrolytes -diarrhea or vomiting -gout -heart disease -kidney disease, small amounts of urine, or difficulty passing urine -liver disease -thyroid disease -an unusual or allergic reaction to furosemide, sulfa drugs, other medicines, foods, dyes, or preservatives -pregnant or trying to get pregnant -breast-feeding How should I use this medicine? Take this medicine by mouth with a glass of water. Follow the directions on the prescription label. You may take this medicine with or without food. If it upsets your stomach, take it with food or milk. Do not take your medicine more often than directed. Remember that you will need to pass more urine after taking this medicine. Do not take your medicine at a time of day that will cause you problems. Do not take at bedtime. Talk to your pediatrician regarding the use of this medicine in children. While this drug may be prescribed for selected conditions, precautions do apply. Overdosage: If you think you have taken too much of this medicine contact a poison control center or emergency room at once. NOTE: This medicine is only for you. Do not share this medicine with others. What if I miss a dose? If you miss a dose, take it as soon as  you can. If it is almost time for your next dose, take only that dose. Do not take double or extra doses. What may interact with this medicine? -aspirin and aspirin-like medicines -certain antibiotics -chloral hydrate -cisplatin -cyclosporine -digoxin -diuretics -laxatives -lithium -medicines for blood pressure  -medicines that relax muscles for surgery -methotrexate -NSAIDs, medicines for pain and inflammation like ibuprofen, naproxen, or indomethacin -phenytoin -steroid medicines like prednisone or cortisone -sucralfate -thyroid hormones This list may not describe all possible interactions. Give your health care provider a list of all the medicines, herbs, non-prescription drugs, or dietary supplements you use. Also tell them if you smoke, drink alcohol, or use illegal drugs. Some items may interact with your medicine. What should I watch for while using this medicine? Visit your doctor or health care professional for regular checks on your progress. Check your blood pressure regularly. Ask your doctor or health care professional what your blood pressure should be, and when you should contact him or her. If you are a diabetic, check your blood sugar as directed. You may need to be on a special diet while taking this medicine. Check with your doctor. Also, ask how many glasses of fluid you need to drink a day. You must not get dehydrated. You may get drowsy or dizzy. Do not drive, use machinery, or do anything that needs mental alertness until you know how this drug affects you. Do not stand or sit up quickly, especially if you are an older patient. This reduces the risk of dizzy or fainting spells. Alcohol can make you more drowsy and dizzy. Avoid alcoholic drinks. This medicine can make you more sensitive to the sun. Keep out of the sun. If you cannot avoid being in the sun, wear protective clothing and use sunscreen. Do not use sun lamps or tanning beds/booths. What side effects may I notice from receiving this medicine? Side effects that you should report to your doctor or health care professional as soon as possible: -blood in urine or stools -dry mouth -fever or chills -hearing loss or ringing in the ears -irregular heartbeat -muscle pain or weakness, cramps -skin rash -stomach upset, pain, or  nausea -tingling or numbness in the hands or feet -unusually weak or tired -vomiting or diarrhea -yellowing of the eyes or skin Side effects that usually do not require medical attention (report to your doctor or health care professional if they continue or are bothersome): -headache -loss of appetite -unusual bleeding or bruising This list may not describe all possible side effects. Call your doctor for medical advice about side effects. You may report side effects to FDA at 1-800-FDA-1088. Where should I keep my medicine? Keep out of the reach of children. Store at room temperature between 15 and 30 degrees C (59 and 86 degrees F). Protect from light. Throw away any unused medicine after the expiration date. NOTE: This sheet is a summary. It may not cover all possible information. If you have questions about this medicine, talk to your doctor, pharmacist, or health care provider.  2019 Elsevier/Gold Standard (2014-08-03 13:49:50)   Echocardiogram An echocardiogram is a procedure that uses painless sound waves (ultrasound) to produce an image of the heart. Images from an echocardiogram can provide important information about:  Signs of coronary artery disease (CAD).  Aneurysm detection. An aneurysm is a weak or damaged part of an artery wall that bulges out from the normal force of blood pumping through the body.  Heart size and shape. Changes in the size or  shape of the heart can be associated with certain conditions, including heart failure, aneurysm, and CAD.  Heart muscle function.  Heart valve function.  Signs of a past heart attack.  Fluid buildup around the heart.  Thickening of the heart muscle.  A tumor or infectious growth around the heart valves. Tell a health care provider about:  Any allergies you have.  All medicines you are taking, including vitamins, herbs, eye drops, creams, and over-the-counter medicines.  Any blood disorders you have.  Any surgeries  you have had.  Any medical conditions you have.  Whether you are pregnant or may be pregnant. What are the risks? Generally, this is a safe procedure. However, problems may occur, including:  Allergic reaction to dye (contrast) that may be used during the procedure. What happens before the procedure? No specific preparation is needed. You may eat and drink normally. What happens during the procedure?   An IV tube may be inserted into one of your veins.  You may receive contrast through this tube. A contrast is an injection that improves the quality of the pictures from your heart.  A gel will be applied to your chest.  A wand-like tool (transducer) will be moved over your chest. The gel will help to transmit the sound waves from the transducer.  The sound waves will harmlessly bounce off of your heart to allow the heart images to be captured in real-time motion. The images will be recorded on a computer. The procedure may vary among health care providers and hospitals. What happens after the procedure?  You may return to your normal, everyday life, including diet, activities, and medicines, unless your health care provider tells you not to do that. Summary  An echocardiogram is a procedure that uses painless sound waves (ultrasound) to produce an image of the heart.  Images from an echocardiogram can provide important information about the size and shape of your heart, heart muscle function, heart valve function, and fluid buildup around your heart.  You do not need to do anything to prepare before this procedure. You may eat and drink normally.  After the echocardiogram is completed, you may return to your normal, everyday life, unless your health care provider tells you not to do that. This information is not intended to replace advice given to you by your health care provider. Make sure you discuss any questions you have with your health care provider. Document Released:  05/10/2000 Document Revised: 06/15/2016 Document Reviewed: 06/15/2016 Elsevier Interactive Patient Education  2019 [employer].

## 2018-11-10 LAB — BASIC METABOLIC PANEL
BUN/Creatinine Ratio: 18 (ref 12–28)
BUN: 19 mg/dL (ref 8–27)
CO2: 21 mmol/L (ref 20–29)
Calcium: 9.6 mg/dL (ref 8.7–10.3)
Chloride: 105 mmol/L (ref 96–106)
Creatinine, Ser: 1.05 mg/dL — ABNORMAL HIGH (ref 0.57–1.00)
GFR calc Af Amer: 57 mL/min/{1.73_m2} — ABNORMAL LOW (ref >59)
GFR calc non Af Amer: 50 mL/min/{1.73_m2} — ABNORMAL LOW (ref >59)
Glucose: 102 mg/dL — ABNORMAL HIGH (ref 65–99)
Potassium: 4.1 mmol/L (ref 3.5–5.2)
Sodium: 140 mmol/L (ref 134–144)

## 2018-11-10 LAB — PRO B NATRIURETIC PEPTIDE: NT-Pro BNP: 3016 pg/mL — ABNORMAL HIGH (ref 0–738)

## 2018-11-17 ENCOUNTER — Other Ambulatory Visit: Payer: Self-pay

## 2018-11-17 ENCOUNTER — Ambulatory Visit (HOSPITAL_BASED_OUTPATIENT_CLINIC_OR_DEPARTMENT_OTHER)
Admission: RE | Admit: 2018-11-17 | Discharge: 2018-11-17 | Disposition: A | Discharge status: Home / Self Care | Payer: Self-pay | Source: Ambulatory Visit | Attending: Cardiology | Admitting: Cardiology

## 2018-11-17 DIAGNOSIS — Z9889 Other specified postprocedural states: Secondary | ICD-10-CM | POA: Insufficient documentation

## 2018-11-17 DIAGNOSIS — I509 Heart failure, unspecified: Secondary | ICD-10-CM | POA: Insufficient documentation

## 2018-11-17 DIAGNOSIS — I110 Hypertensive heart disease with heart failure: Secondary | ICD-10-CM | POA: Insufficient documentation

## 2018-11-17 NOTE — Progress Notes (Signed)
  Echocardiogram 2D Echocardiogram has been performed.  Alexis [name] 11/17/2018, 11:56 AM

## 2018-12-14 NOTE — Progress Notes (Signed)
Cardiology Office Note:    Date:  12/15/2018   ID:  Alexis [name], DOB Oct 07, 1933, MRN [id number]  PCP:  [name], MD  Cardiologist:  Shirlee More, MD    Referring MD: [name], MD    ASSESSMENT:    1. Chronic diastolic congestive heart failure ([place])   2. Permanent atrial fibrillation   3. Long term current use of anticoagulant therapy   4. Hypertensive heart disease with congestive heart failure, unspecified heart failure type ([place])    PLAN:    In order of problems listed above:  1. Heart failure is improved continue her current loop diuretic sodium restriction compliance of medications and daily weights at home.  I will see him in 52-month intervals regarding progression of valvular dysfunction continue her current furosemide check renal function proBNP today 2. Stable rate controlled continue beta-blocker 3. Continue warfarin managed and 4. Stable hypertension continue beta-blocker thiazide diuretic 5. Stable hyperlipidemia continue statin   Next appointment: 3 mos   Medication Adjustments/Labs and Tests Ordered: Current medicines are reviewed at length with the patient today.  Concerns regarding medicines are outlined above.  No orders of the defined types were placed in this encounter.  No orders of the defined types were placed in this encounter.   Chief Complaint  Patient presents with  . Follow-up  . Congestive Heart Failure    History of Present Illness:    Alexis [name] is a 85 y.o. female with a hx of CVA, permanent atrial fibrillation on Coumadin, CAD s/p CABG in 2009, and also a bioprosthetic aortic valve replacement and mitral valve repair in 2009 presented with recurrent fever and found by PCP to have positive blood culture and endocarditis treated successfully with antibiotic therapy.. TEE showed small vegetation on mitral valve recommended IV abx for 6 weeks. Cardiology consulted for afib with prolonged pauses up to  2.65 secs  last seen as an in patient 06/26/2016.  She was treated with IV antibiotics for streptococcal endocarditis.  She was last seen 11/06/2018 with SOB and heart failure Pro BNP 3016 Compliance with diet, lifestyle and medications: Yes  Fortunately supervised by her daughter.  Medications are taken weight is down 10 to 12 pounds edema is cleared and she is no longer short of breath.  I reviewed with the patient and her daughter that she has both a aortic and mitral tissue valve prosthesis and has evidence of significant dysfunction of her mitral valve prosthesis at this time the heart failure was compensators and I would not advise elective interventions will continue her diuretic close observation follow every 3 months and if heart failure becomes progressive or refractory could be considered for valve in valve percutaneous replacement.  She has had no bleeding complications of her Coumadin no palpitations syncope edema orthopnea. Past Medical History:  Diagnosis Date  . Abnormal brain MRI   . Adjustment disorder with mixed anxiety and depressed mood 07/09/2016  . Anxiety   . Atrial fibrillation ([place])   . CAD (coronary artery disease) of artery bypass graft   . Cerebrovascular disease   . Chronic atrial fibrillation 05/31/2008   Atrial Fibrillation  . Congestive heart failure ([place]) 05/31/2008   2011 - resolved s/p CABG and valve replacement Altace   . CONSTIPATION 05/31/2008   Qualifier: Diagnosis of  By: Doralee Albino    . Coronary atherosclerosis 05/31/2008   S/p CABG 2011   Coronary Artery Disease  10/1 IMO update  . CVA (cerebral vascular accident) ([place]) 05/2000   "  memory issues, dizziness since" (07/08/2016  . Depression   . Dizzinesses    "often since [facility] 2009" (07/08/2016)  . Eczema 06/06/2016   1/18 back  . Encounter for monitoring Coumadin therapy 03/30/2012   2011   . Endocarditis dx'd 05/2016  . Essential hypertension 05/31/2008   Chronic     . Fever 12/03/2016   2/18 h/o Strep  viridance bacteremia in 2/18. PICC line is out.  We may need to remove her 2 remaining teeth  . Foot fracture, left    "no OR; from fall"  . Gastroenteritis 06/20/2014   Likely viral 1/16   . GERD (gastroesophageal reflux disease)    hx  . High cholesterol   . History of blood transfusion ~ 1960  . Hypertension   . Long term current use of anticoagulant therapy 06/13/2017  . Long-term (current) use of anticoagulants, INR goal 2.0-3.0 06/26/2017  . Medication reaction 07/08/2016  . Memory loss 08/16/2013   3/15 mild 2019 Worse Start Aricept  B complex qd  . Mitral valve replaced 07/08/2016  . Myalgia 05/31/2008   Qualifier: Diagnosis of  By: Doralee Albino    . Orthostatic hypotension 06/20/2014   Likely due to viral gastroenteritis 1/16 Hold Ramipril and Spironolactone x 3 days Labs   . Other chest pain 05/31/2008   1/18 L scapula  MSK after raking leaves No CP now  Atypical Chest Pain  . Pneumonia 1-2 X  . Polyarthritis rheumatica ([place])   . 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([place])   . Seizures ([place])    2/18 abn EEG ???thalamic CVA - somnolent On Keppra  . Shoulder blade pain 06/06/2016   1/18 MSK  . 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 Stop date for antibiotic treatment was March 12. Due to multiple reactions and borderline renal function she is now on vancomycin and genta    Past Surgical History:  Procedure Laterality Date  . AORTIC VALVE REPLACEMENT  03/2008  . CARDIAC VALVE REPLACEMENT    . CATARACT EXTRACTION W/ INTRAOCULAR LENS IMPLANT Left   . CORONARY ANGIOPLASTY  2009  . CORONARY ARTERY BYPASS GRAFT  03/2008  . MITRAL VALVE REPAIR (MV)/CORONARY ARTERY BYPASS GRAFTING (CABG)  03/2008   2009 [facility]  . TEE WITHOUT  CARDIOVERSION N/A 06/26/2016   Procedure: TRANSESOPHAGEAL ECHOCARDIOGRAM (TEE);  Surgeon: Josue Hector, MD;  Location: [facility] ENDOSCOPY;  Service: Cardiovascular;  Laterality: N/A;    Current Medications: Current Meds  Medication Sig  . acetaminophen (TYLENOL) 500 MG tablet Take 500 mg by mouth every 6 (six) hours as needed.  [name] atorvastatin (LIPITOR) 10 MG tablet Take 1 tablet (10 mg total) by mouth daily.  [name] b complex vitamins tablet Take 1 tablet by mouth daily.  . bisoprolol-hydrochlorothiazide (ZIAC) 2.5-6.25 MG tablet Take 1/2 tablet by mouth daily  . Cholecalciferol (VITAMIN D3) 2000 units capsule Take 1 capsule (2,000 Units total) by mouth daily.  [name] donepezil (ARICEPT) 5 MG tablet Take 1 tablet (5 mg total) by mouth at bedtime.  . furosemide (LASIX) 20 MG tablet Take 1 tablet (20 mg total) by mouth daily.  [name] loratadine (CLARITIN) 10 MG tablet Take 10 mg by mouth daily as needed for allergies.  [name] warfarin (COUMADIN) 2.5 MG tablet Take 2.5 mg by mouth as  directed. Takes 2.5mg  on Tues, Thurs, Sat, Sun and 1.25mg  on Mon, Wed, and Fri     Allergies:   Ceftriaxone and Hydralazine hcl   Social History   Socioeconomic History  . Marital status: Widowed    Spouse name: Not on file  . Number of children: Not on file  . Years of education: Not on file  . Highest education level: Not on file  Occupational History  . Not on file  Social Needs  . Financial resource strain: Not on file  . Food insecurity    Worry: Not on file    Inability: Not on file  . Transportation needs    Medical: Not on file    Non-medical: Not on file  Tobacco Use  . Smoking status: Never Smoker  . Smokeless tobacco: Never Used  Substance and Sexual Activity  . Alcohol use: No  . Drug use: No  . Sexual activity: Not on file  Lifestyle  . Physical activity    Days per week: Not on file    Minutes per session: Not on file  . Stress: Not on file  Relationships  . Social Herbalist on phone: Not  on file    Gets together: Not on file    Attends religious service: Not on file    Active member of club or organization: Not on file    Attends meetings of clubs or organizations: Not on file    Relationship status: Not on file  Other Topics Concern  . Not on file  Social History Narrative  . Not on file     Family History: The patient's family history includes Lung cancer in her brother; Stroke in her maternal grandmother. There is no history of CAD. ROS:   Please see the history of present illness.    All other systems reviewed and are negative.  EKGs/Labs/Other Studies Reviewed:    The following studies were reviewed today:    Echo 11/17/2018:   1. The left ventricle has normal systolic function, with an ejection fraction of 55-60%. The cavity size was normal. There is mild concentric left ventricular hypertrophy. Left ventricular diastolic Doppler parameters are indeterminate. No evidence of  left ventricular regional wall motion abnormalities.  2. Left atrial size was severely dilated.  3. Right atrial size was severely dilated.  4. Severe thickening of the mitral valve leaflet with severe stenosis and moderate regurgitation. Mean gradient 9 mm Hg MVA 0.93cm2 by PT1/2  5. Tricuspid valve regurgitation is moderate-severe.  6. Moderate thickening of the aortic valve. normal bioprosthetic AVR velocity and flow profile of the aortic valve.  7. The inferior vena cava was dilated in size with >50% respiratory variability.  8. A valve is present in the mitral position.   Recent Labs: 05/04/2018: ALT 15; Hemoglobin 12.2; Platelets 244.0; TSH 1.52 11/06/2018: BUN 19; Creatinine, Ser 1.05; NT-Pro BNP 3,016; Potassium 4.1; Sodium 140  Recent Lipid Panel    Component Value Date/Time   CHOL 169 05/04/2018 1527   TRIG 158.0 (H) 05/04/2018 1527   HDL 46.00 05/04/2018 1527   CHOLHDL 4 05/04/2018 1527   VLDL 31.6 05/04/2018 1527   LDLCALC 91 05/04/2018 1527    Physical Exam:     VS:  BP 116/74 (BP Location: Left Arm, Patient Position: Sitting, Cuff Size: Normal)   Pulse 78   Temp 98.2 F (36.8 C)   Ht 4\' 10"  (1.473 m)   Wt 140 lb (63.5 kg)   SpO2  96%   BMI 29.26 kg/m     Wt Readings from Last 3 Encounters:  12/15/18 140 lb (63.5 kg)  11/06/18 143 lb 6.4 oz (65 kg)  05/04/18 155 lb (70.3 kg)     GEN:  Well nourished, well developed in no acute distress HEENT: Normal NECK: No JVD; No carotid bruits LYMPHATICS: No lymphadenopathy CARDIAC: Irregular variable first heart sound grade 2/6 murmur of mitral stenosis RRR, no murmurs, rubs, gallops RESPIRATORY:  Clear to auscultation without rales, wheezing or rhonchi  ABDOMEN: Soft, non-tender, non-distended MUSCULOSKELETAL:  No edema; No deformity  SKIN: Warm and dry NEUROLOGIC:  Alert and oriented x 3 PSYCHIATRIC:  Normal affect    Signed, Shirlee More, MD  12/15/2018 10:40 AM    [place]

## 2018-12-15 ENCOUNTER — Other Ambulatory Visit: Payer: Self-pay

## 2018-12-15 ENCOUNTER — Encounter: Payer: Self-pay | Admitting: Cardiology

## 2018-12-15 ENCOUNTER — Ambulatory Visit (INDEPENDENT_AMBULATORY_CARE_PROVIDER_SITE_OTHER): Payer: Self-pay | Admitting: Cardiology

## 2018-12-15 VITALS — BP 116/74 | HR 78 | Temp 98.2°F | Ht <= 58 in | Wt 140.0 lb

## 2018-12-15 DIAGNOSIS — I5032 Chronic diastolic (congestive) heart failure: Secondary | ICD-10-CM

## 2018-12-15 DIAGNOSIS — I4821 Permanent atrial fibrillation: Secondary | ICD-10-CM

## 2018-12-15 DIAGNOSIS — I110 Hypertensive heart disease with heart failure: Secondary | ICD-10-CM

## 2018-12-15 DIAGNOSIS — I679 Cerebrovascular disease, unspecified: Secondary | ICD-10-CM

## 2018-12-15 DIAGNOSIS — Z7901 Long term (current) use of anticoagulants: Secondary | ICD-10-CM

## 2018-12-15 NOTE — Patient Instructions (Signed)
Medication Instructions:  Your physician recommends that you continue on your current medications as directed. Please refer to the Current Medication list given to you today. If you need a refill on your cardiac medications before your next appointment, please call your pharmacy.   Lab work  Pro BNP BMP  If you have labs (blood work) drawn today and your tests are completely normal, you will receive your results only by: [name] MyChart Message (if you have MyChart) OR . A paper copy in the mail If you have any lab test that is abnormal or we need to change your treatment, we will call you to review the results.  Testing/Procedures: NONE  Follow-Up: At [facility], you and your health needs are our priority.  As part of our continuing mission to provide you with exceptional heart care, we have created designated Provider Care Teams.  These Care Teams include your primary Cardiologist (physician) and Advanced Practice Providers (APPs -  Physician Assistants and Nurse Practitioners) who all work together to provide you with the care you need, when you need it. . You will need a follow up appointment in 3 months.    Any Other Special Instructions Will Be Listed Below (If Applicable). Referring to Dr. Myrlene Broker, Neurology

## 2018-12-16 LAB — BASIC METABOLIC PANEL
BUN/Creatinine Ratio: 18 (ref 12–28)
BUN: 22 mg/dL (ref 8–27)
CO2: 21 mmol/L (ref 20–29)
Calcium: 9.7 mg/dL (ref 8.7–10.3)
Chloride: 103 mmol/L (ref 96–106)
Creatinine, Ser: 1.2 mg/dL — ABNORMAL HIGH (ref 0.57–1.00)
GFR calc Af Amer: 49 mL/min/{1.73_m2} — ABNORMAL LOW (ref >59)
GFR calc non Af Amer: 42 mL/min/{1.73_m2} — ABNORMAL LOW (ref >59)
Glucose: 90 mg/dL (ref 65–99)
Potassium: 4 mmol/L (ref 3.5–5.2)
Sodium: 143 mmol/L (ref 134–144)

## 2018-12-16 LAB — PRO B NATRIURETIC PEPTIDE: NT-Pro BNP: 1337 pg/mL — ABNORMAL HIGH (ref 0–738)

## 2019-01-14 DIAGNOSIS — Z9889 Other specified postprocedural states: Secondary | ICD-10-CM | POA: Insufficient documentation

## 2019-01-20 ENCOUNTER — Encounter: Payer: Self-pay | Admitting: Neurology

## 2019-01-20 ENCOUNTER — Telehealth: Payer: Self-pay | Admitting: Neurology

## 2019-01-20 ENCOUNTER — Ambulatory Visit: Payer: Self-pay | Admitting: Neurology

## 2019-01-20 ENCOUNTER — Other Ambulatory Visit: Payer: Self-pay

## 2019-01-20 VITALS — BP 117/79 | HR 87 | Temp 97.5°F | Ht <= 58 in | Wt 141.5 lb

## 2019-01-20 DIAGNOSIS — R413 Other amnesia: Secondary | ICD-10-CM

## 2019-01-20 DIAGNOSIS — H814 Vertigo of central origin: Secondary | ICD-10-CM

## 2019-01-20 DIAGNOSIS — T826XXD Infection and inflammatory reaction due to cardiac valve prosthesis, subsequent encounter: Secondary | ICD-10-CM

## 2019-01-20 DIAGNOSIS — I330 Acute and subacute infective endocarditis: Secondary | ICD-10-CM

## 2019-01-20 DIAGNOSIS — I38 Endocarditis, valve unspecified: Secondary | ICD-10-CM

## 2019-01-20 DIAGNOSIS — I679 Cerebrovascular disease, unspecified: Secondary | ICD-10-CM

## 2019-01-20 DIAGNOSIS — I482 Chronic atrial fibrillation, unspecified: Secondary | ICD-10-CM

## 2019-01-20 DIAGNOSIS — R569 Unspecified convulsions: Secondary | ICD-10-CM

## 2019-01-20 MED ORDER — DONEPEZIL HCL 10 MG PO TABS: 10.0000 mg | ORAL_TABLET | Freq: Every day | ORAL | 3 refills | Status: DC

## 2019-01-20 NOTE — Telephone Encounter (Signed)
self pay order sent to GI. They will reach out to the patient to schedule.  °

## 2019-01-20 NOTE — Progress Notes (Signed)
[facility]  PATIENT: Alexis [name] DOB: 20-May-1934  REFERRING DOCTOR OR PCP: Dr. Alain Marion SOURCE: Patient, notes from primary care, notes from 2018 hospital admissions, imaging and laboratory reports, MRI images personally reviewed.  _________________________________   HISTORICAL  CHIEF COMPLAINT:   HISTORY OF PRESENT ILLNESS:  I had the pleasure of seeing patient,Alexis [name], at [facility] for neurologic consultation regarding her history of stroke and encephalopathy.  She was admitted with strep endocarditis 06/22/2016 and discharged on 06/28/2016 with IV antibiotics.   She then represented to the emergency room 07/08/2016 with start of effects from her antibiotic treatment and was discharged the next day.  On 07/12/2016, she was readmitted after the mother found her confused, incoherent and unable to stand up.  There was no focal weakness.  She was admitted due to the encephalopathy and EEG showed left mid temporal lobe epileptiform activity..   She was placed on Keppra.   MRI was concerning for artery of Percheron CVA with small bilateral DWI abnormalities noted.  Due to movement artifact, interpretation is difficult.  I personally reviewed the MRI and agree that there are diffusion-weighted abnormalities in the medial thalamus bilaterally and right upper midbrain.  She had a long hospital stay being discharged 07/18/2016.   She improved with normal level of consciousness.  However, she had continued behavioral and memory issues.  She had a stroke felt to be infratentorial with vertigo in 2002.   She has been more forgetful for 2-3 years, even before her thalamic stroke. This was very mild before the February 2018 admissions.   During the February 2018 admission she had unusual child-like behavior and some hallucinations. Initially issues were more with short term memory and now she has both short term and long term memory.    She sees  Dr. Bettina Gavia for endocarditis and Atrial fibrillation.  She had had CABG and an atrial valve replacement in 2009.   She is on coumadin as a blood thinner and tolerates it well.     She has been on donepezil since December 2019.   She has had mild behavioral issues with anger and irritability but no psychosis.   She did have some visual hallucinations in 2018 when she had endocarditis  She reports a translational sort of vertigo where it seems like things around her are moving.    No rotational vertigo.   Symptoms are worse when she tries to walk and much milder if in a wheelchair.     She feels safer when she   REVIEW OF SYSTEMS: Constitutional: No fevers, chills, sweats, or change in appetite Eyes: No visual changes, double vision, eye pain Ear, nose and throat: No hearing loss, ear pain, nasal congestion, sore throat Cardiovascular: as above. Respiratory: No shortness of breath at rest or with exertion.   No wheezes GastrointestinaI: No nausea, vomiting, diarrhea, abdominal pain, fecal incontinence Genitourinary: No dysuria, urinary retention or frequency.  No nocturia. Musculoskeletal: No neck pain, back pain Integumentary: No rash, pruritus, skin lesions Neurological: as above Psychiatric: She has had some irritability and anger since the stroke in 2018. Endocrine: No palpitations, diaphoresis, change in appetite, change in weigh or increased thirst Hematologic/Lymphatic: No anemia, purpura, petechiae. Allergic/Immunologic: No itchy/runny eyes, nasal congestion, recent allergic reactions, rashes  ALLERGIES: Allergies  Allergen Reactions  . Ceftriaxone Other (See Comments)    Per MD progress note 2/12:  Infusion reaction.  Denies other associated symptoms such as diaphoresis, nausea, vomiting, dysuria, diarrhea, lower abdominal pain, received  palpitations, swelling, difficulty breathing  . Hydralazine Hcl Other (See Comments)    Headache     HOME MEDICATIONS:  Current  Outpatient Medications:  .  acetaminophen (TYLENOL) 500 MG tablet, Take 500 mg by mouth every 6 (six) hours as needed., Disp: , Rfl:  .  atorvastatin (LIPITOR) 10 MG tablet, Take 1 tablet (10 mg total) by mouth daily., Disp: 90 tablet, Rfl: 3 .  b complex vitamins tablet, Take 1 tablet by mouth daily., Disp: 100 tablet, Rfl: 3 .  bisoprolol-hydrochlorothiazide (ZIAC) 2.5-6.25 MG tablet, Take 1/2 tablet by mouth daily, Disp: 45 tablet, Rfl: 3 .  Cholecalciferol (VITAMIN D3) 2000 units capsule, Take 1 capsule (2,000 Units total) by mouth daily., Disp: 100 capsule, Rfl: 3 .  donepezil (ARICEPT) 10 MG tablet, Take 1 tablet (10 mg total) by mouth at bedtime., Disp: 90 tablet, Rfl: 3 .  furosemide (LASIX) 20 MG tablet, Take 1 tablet (20 mg total) by mouth daily., Disp: 90 tablet, Rfl: 3 .  loratadine (CLARITIN) 10 MG tablet, Take 10 mg by mouth daily as needed for allergies., Disp: , Rfl:  .  warfarin (COUMADIN) 2.5 MG tablet, Take 2.5 mg by mouth as directed. Takes 2.5mg  on Tues, Thurs, Sat, Sun and 1.25mg  on Mon, Wed, and Fri, Disp: , Rfl:   PAST MEDICAL HISTORY: Past Medical History:  Diagnosis Date  . Abnormal brain MRI   . Adjustment disorder with mixed anxiety and depressed mood 07/09/2016  . Anxiety   . Atrial fibrillation ([place])   . CAD (coronary artery disease) of artery bypass graft   . Cerebrovascular disease   . Chronic atrial fibrillation 05/31/2008   Atrial Fibrillation  . Congestive heart failure ([place]) 05/31/2008   2011 - resolved s/p CABG and valve replacement Altace   . CONSTIPATION 05/31/2008   Qualifier: Diagnosis of  By: Doralee Albino    . Coronary atherosclerosis 05/31/2008   S/p CABG 2011   Coronary Artery Disease  10/1 IMO update  . CVA (cerebral vascular accident) ([place]) 05/2000   "memory issues, dizziness since" (07/08/2016  . Depression   . Dizzinesses    "often since OHS 2009" (07/08/2016)  . Eczema 06/06/2016   1/18 back  . Encounter for monitoring Coumadin therapy  03/30/2012   2011   . Endocarditis dx'd 05/2016  . Essential hypertension 05/31/2008   Chronic     . Fever 12/03/2016   2/18 h/o Strep viridance bacteremia in 2/18. PICC line is out.  We may need to remove her 2 remaining teeth  . Foot fracture, left    "no OR; from fall"  . Gastroenteritis 06/20/2014   Likely viral 1/16   . GERD (gastroesophageal reflux disease)    hx  . High cholesterol   . History of blood transfusion ~ 1960  . Hypertension   . Long term current use of anticoagulant therapy 06/13/2017  . Long-term (current) use of anticoagulants, INR goal 2.0-3.0 06/26/2017  . Medication reaction 07/08/2016  . Memory loss 08/16/2013   3/15 mild 2019 Worse Start Aricept  B complex qd  . Mitral valve replaced 07/08/2016  . Myalgia 05/31/2008   Qualifier: Diagnosis of  By: Doralee Albino    . Orthostatic hypotension 06/20/2014   Likely due to viral gastroenteritis 1/16 Hold Ramipril and Spironolactone x 3 days Labs   . Other chest pain 05/31/2008   1/18 L scapula  MSK after raking leaves No CP now  Atypical Chest Pain  . Pneumonia 1-2 X  . Polyarthritis rheumatica (  [place])   . 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([place])   . Seizures ([place])    2/18 abn EEG ???thalamic CVA - somnolent On Keppra  . Shoulder blade pain 06/06/2016   1/18 MSK  . 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 Stop date for antibiotic treatment was March 12. Due to multiple reactions and borderline renal function she is now on vancomycin and genta    PAST SURGICAL HISTORY: Past Surgical History:  Procedure Laterality Date  . AORTIC VALVE REPLACEMENT  03/2008  . CARDIAC VALVE REPLACEMENT    . CATARACT EXTRACTION W/ INTRAOCULAR LENS IMPLANT Left   . CORONARY ANGIOPLASTY  2009   . CORONARY ARTERY BYPASS GRAFT  03/2008  . MITRAL VALVE REPAIR (MV)/CORONARY ARTERY BYPASS GRAFTING (CABG)  03/2008   2009 [facility]  . TEE WITHOUT CARDIOVERSION N/A 06/26/2016   Procedure: TRANSESOPHAGEAL ECHOCARDIOGRAM (TEE);  Surgeon: Josue Hector, MD;  [facility] END[facility]COPY;  Service: Cardiovascular;  Laterality: N/A;    FAMILY HISTORY: Family History  Problem Relation Age of Onset  . Stroke Maternal Grandmother   . Lung cancer Brother   . CAD Neg Hx     SOCIAL HISTORY:  Social History   Socioeconomic History  . Marital status: Widowed    Spouse name: Not on file  . Number of children: Not on file  . Years of education: Not on file  . Highest education level: Not on file  Occupational History  . Not on file  Social Needs  . Financial resource strain: Not on file  . Food insecurity    Worry: Not on file    Inability: Not on file  . Transportation needs    Medical: Not on file    Non-medical: Not on file  Tobacco Use  . Smoking status: Never Smoker  . Smokeless tobacco: Never Used  Substance and Sexual Activity  . Alcohol use: No  . Drug use: No  . Sexual activity: Not on file  Lifestyle  . Physical activity    Days per week: Not on file    Minutes per session: Not on file  . Stress: Not on file  Relationships  . Social Herbalist on phone: Not on file    Gets together: Not on file    Attends religious service: Not on file    Active member of club or organization: Not on file    Attends meetings of clubs or organizations: Not on file    Relationship status: Not on file  . Intimate partner violence    Fear of current or ex partner: Not on file    Emotionally abused: Not on file    Physically abused: Not on file    Forced sexual activity: Not on file  Other Topics Concern  . Not on file  Social History Narrative   Right handed    Caffeine: drinks 2 cups decaf coffee per day   2 cups tea per day     PHYSICAL EXAM  Vitals:    01/20/19 1012  BP: 117/79  Pulse: 87  Temp: (!) 97.5 F (36.4 C)  Weight: 141 lb 8 oz (64.2 kg)  Height: 4\' 10"  (1.473 m)    Body mass index is 29.57 kg/m.   General: The patient  is well-developed and well-nourished and in no acute distress  HEENT:  Head is [place]/AT.  Sclera are anicteric.  Funduscopic exam shows normal optic discs and retinal vessels.  Neck: No carotid bruits are noted.     Cardiovascular: The heart has an irregular rhythm and a 2/6 systolic murmur. There were no murmurs, gallops or rubs.    Skin: Extremities show pedal edema edema.   Neurologic Exam  Mental status: She is alert and able to answer most questions through her daughter as interpreter.  Due to the language difficulties, cognition was difficult to assess.   Speech appears to be normal in her native language.  Cranial nerves: Extraocular movements are full.   Facial symmetry is present. There is good facial sensation to soft touch bilaterally.Facial strength is normal.  Trapezius and sternocleidomastoid strength is normal. No dysarthria is noted.  The tongue is midline, and the patient has symmetric elevation of the soft palate. No obvious hearing deficits are noted.  Motor:  Muscle bulk is normal.   Tone is normal. Strength is  5 / 5 in all 4 extremities.   Sensory: Sensory testing is intact to pinprick, soft touch and vibration sensation in all 4 extremities.  Coordination: Cerebellar testing reveals good finger-nose-finger and heel-to-shin bilaterally.  Gait and station: Station is normal.   Her gait is fairly normal for age.  The tandem gait is mildly wide.  Romberg is negative.  Reflexes: Deep tendon reflexes are symmetric and normal bilaterally.   Plantar responses are flexor.    DIAGNOSTIC DATA (LABS, IMAGING, TESTING) - I reviewed patient records, labs, notes, testing and imaging myself where available.  Lab Results  Component Value Date   WBC 6.1 05/04/2018   HGB 12.2 05/04/2018   HCT  36.2 05/04/2018   MCV 82.9 05/04/2018   PLT 244.0 05/04/2018      Component Value Date/Time   NA 143 12/15/2018 1113   K 4.0 12/15/2018 1113   CL 103 12/15/2018 1113   CO2 21 12/15/2018 1113   GLUCOSE 90 12/15/2018 1113   GLUCOSE 114 (H) 05/04/2018 1527   BUN 22 12/15/2018 1113   CREATININE 1.20 (H) 12/15/2018 1113   CALCIUM 9.7 12/15/2018 1113   PROT 7.7 05/04/2018 1527   ALBUMIN 4.0 05/04/2018 1527   AST 23 05/04/2018 1527   ALT 15 05/04/2018 1527   ALKPHOS 79 05/04/2018 1527   BILITOT 0.6 05/04/2018 1527   GFRNONAA 42 (L) 12/15/2018 1113   GFRAA 49 (L) 12/15/2018 1113   Lab Results  Component Value Date   CHOL 169 05/04/2018   HDL 46.00 05/04/2018   LDLCALC 91 05/04/2018   TRIG 158.0 (H) 05/04/2018   CHOLHDL 4 05/04/2018   Lab Results  Component Value Date   HGBA1C 5.8 (H) 07/13/2016   Lab Results  Component Value Date   VITAMINB12 198 07/14/2016   Lab Results  Component Value Date   TSH 1.52 05/04/2018       ASSESSMENT AND PLAN    1. Chronic atrial fibrillation   2. Memory loss   3. Vertigo of central origin   4. Cerebrovascular disease   5. Endocarditis of prosthetic valve, subsequent encounter   6. Seizures ([place])    In summary, Ms. Brooking is a 85 year old woman who had a stroke likely in the distribution of the artery of Percheron at the tip of the basilar artery going to the medial thalamus and midbrain.  This could be related to her valvular issues, A. fib or  endocarditis at the time of the stroke.  She has had persistent issues with cognition though before the stroke she was noted to have mild memory issues.  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  I will increase the donepezil from 5 mg to 10 mg.  We will check a CT scan of the head.  She has been unable to cooperate with a lot of movement artifact previously on an MRI so hopefully we can get a clear CT scan.   She  does not appear to have any seizures since February 2018 and is no longer on an anti-epilepsy medication.  She will return to see [place] as needed if she has new or worsening neurologic symptoms.  Thank you for asking me to see Ms. Olberding.  Please let me know if I can be of further assistance with her or other patients in the future.   Richard A. Felecia Shelling, MD, [facility] 06/30/5595, 4:16 PM Certified in Neurology, Clinical Neurophysiology, Sleep Medicine and Neuroimaging  [facility] [address], [place], [place] [address] [phone]

## 2019-01-28 ENCOUNTER — Other Ambulatory Visit: Payer: Self-pay

## 2019-01-28 ENCOUNTER — Ambulatory Visit
Admission: RE | Admit: 2019-01-28 | Discharge: 2019-01-28 | Disposition: A | Discharge status: Home / Self Care | Payer: Self-pay | Source: Ambulatory Visit | Attending: Neurology | Admitting: Neurology

## 2019-01-28 DIAGNOSIS — H814 Vertigo of central origin: Secondary | ICD-10-CM

## 2019-01-28 DIAGNOSIS — R413 Other amnesia: Secondary | ICD-10-CM

## 2019-01-29 ENCOUNTER — Telehealth: Payer: Self-pay

## 2019-01-29 NOTE — Telephone Encounter (Signed)
Copied from [place] [id number]. Topic: General - Other >> Jan 29, 2019 10:54 AM Leward Quan A wrote: Reason for CRM: Patient daughter Sumner Boast called to schedule her a visit with Dr Alain Marion for swelling of the feet and SOB which is increasing but  first available was 02/16/2019 but she is asking for something sooner would like a call back at Ph# [phone] Also [name] is asking for [place] to give her a call >> Jan 29, 2019 12:30 PM Lonia Skinner [place] A wrote: Called back, NO answer, left VM.   Patient may need to be triaged?   Patient's daughter states patient has shortness of breath upon walking, has already seen and talked with cardiology, the current med she is taking is all cardiology is able to do---patient refuses to go to ED, dr plotnikov not in office today and patient will not see anyone else---I have advised daughter to call 911 without telling her mom if she feels she needs emergent care---we have made appt with dr Alain Marion for Wednesday 9/9, his first available appt next week---make sure patient stays well hydrated and elevate feet as much as possible, try to rest as much as possible and limit salt intake until she sees dr Alain Marion

## 2019-02-02 ENCOUNTER — Other Ambulatory Visit: Payer: Self-pay | Admitting: Internal Medicine

## 2019-02-03 ENCOUNTER — Other Ambulatory Visit: Payer: Self-pay

## 2019-02-03 ENCOUNTER — Encounter: Payer: Self-pay | Admitting: Internal Medicine

## 2019-02-03 ENCOUNTER — Ambulatory Visit (INDEPENDENT_AMBULATORY_CARE_PROVIDER_SITE_OTHER): Payer: Self-pay | Admitting: Internal Medicine

## 2019-02-03 DIAGNOSIS — I10 Essential (primary) hypertension: Secondary | ICD-10-CM

## 2019-02-03 DIAGNOSIS — R29898 Other symptoms and signs involving the musculoskeletal system: Secondary | ICD-10-CM

## 2019-02-03 DIAGNOSIS — F4323 Adjustment disorder with mixed anxiety and depressed mood: Secondary | ICD-10-CM

## 2019-02-03 DIAGNOSIS — I4821 Permanent atrial fibrillation: Secondary | ICD-10-CM

## 2019-02-03 MED ORDER — ESCITALOPRAM OXALATE 5 MG PO TABS: 5.0000 mg | ORAL_TABLET | Freq: Every day | ORAL | 5 refills | Status: DC

## 2019-02-03 NOTE — Assessment & Plan Note (Addendum)
Discussed - worse Lexapro 5 mg

## 2019-02-03 NOTE — Patient Instructions (Signed)
Hold Lipitor CoQ10 VitD

## 2019-02-03 NOTE — Assessment & Plan Note (Signed)
Nl BP °

## 2019-02-03 NOTE — Assessment & Plan Note (Signed)
No change 

## 2019-02-03 NOTE — Progress Notes (Signed)
Subjective:  Patient ID: Alexis [name], female    DOB: [birth year]  Age: 85 y.o. MRN: [id number]  CC: No chief complaint on file.   HPI Alexis [name] presents for CHF, weakness in the legs x 2 weeks-3 months, much worse 2 weeks wks S/p neurol and cardiol visits   Outpatient Medications Prior to Visit  Medication Sig Dispense Refill  . acetaminophen (TYLENOL) 500 MG tablet Take 500 mg by mouth every 6 (six) hours as needed.    [name] atorvastatin (LIPITOR) 10 MG tablet Take 1 tablet (10 mg total) by mouth daily. 90 tablet 3  . b complex vitamins tablet Take 1 tablet by mouth daily. 100 tablet 3  . bisoprolol-hydrochlorothiazide (ZIAC) 2.5-6.25 MG tablet Take 1/2 tablet by mouth daily 45 tablet 3  . Cholecalciferol (VITAMIN D3) 2000 units capsule Take 1 capsule (2,000 Units total) by mouth daily. 100 capsule 3  . donepezil (ARICEPT) 10 MG tablet Take 1 tablet (10 mg total) by mouth at bedtime. 90 tablet 3  . furosemide (LASIX) 20 MG tablet Take 1 tablet (20 mg total) by mouth daily. 90 tablet 3  . loratadine (CLARITIN) 10 MG tablet Take 10 mg by mouth daily as needed for allergies.    [name] warfarin (COUMADIN) 2.5 MG tablet Take 2.5 mg by mouth as directed. Takes 2.5mg  on Tues, Thurs, Sat, Sun and 1.25mg  on Mon, Wed, and Fri     No facility-administered medications prior to visit.     ROS: Review of Systems  Constitutional: Positive for fatigue. Negative for activity change, appetite change, chills and unexpected weight change.  HENT: Negative for congestion, mouth sores and sinus pressure.   Eyes: Negative for visual disturbance.  Respiratory: Negative for cough and chest tightness.   Gastrointestinal: Negative for abdominal pain and nausea.  Genitourinary: Negative for difficulty urinating, frequency and vaginal pain.  Musculoskeletal: Positive for gait problem. Negative for back pain.  Skin: Negative for pallor and rash.  Neurological: Positive for weakness. Negative for  dizziness, tremors, numbness and headaches.  Psychiatric/Behavioral: Negative for confusion, sleep disturbance and suicidal ideas.    Objective:  BP 116/78 (BP Location: Left Arm, Patient Position: Sitting, Cuff Size: Normal)   Pulse 91   Temp 97.8 F (36.6 C) (Oral)   Ht 4\' 10"  (1.473 m)   Wt 138 lb (62.6 kg)   SpO2 98%   BMI 28.84 kg/m   BP Readings from Last 3 Encounters:  02/03/19 116/78  01/20/19 117/79  12/15/18 116/74    Wt Readings from Last 3 Encounters:  02/03/19 138 lb (62.6 kg)  01/20/19 141 lb 8 oz (64.2 kg)  12/15/18 140 lb (63.5 kg)    Physical Exam Constitutional:      General: She is not in acute distress.    Appearance: She is well-developed.  HENT:     Head: Normocephalic.     Right Ear: External ear normal.     Left Ear: External ear normal.     Nose: Nose normal.  Eyes:     General:        Right eye: No discharge.        Left eye: No discharge.     Conjunctiva/sclera: Conjunctivae normal.     Pupils: Pupils are equal, round, and reactive to light.  Neck:     Musculoskeletal: Normal range of motion and neck supple.     Thyroid: No thyromegaly.     Vascular: No JVD.     Trachea: No tracheal deviation.  Cardiovascular:     Rate and Rhythm: Normal rate. Rhythm irregular.     Heart sounds: Normal heart sounds. No gallop.   Pulmonary:     Effort: Respiratory distress present.     Breath sounds: No stridor. No wheezing.  Abdominal:     General: Bowel sounds are normal. There is no distension.     Palpations: Abdomen is soft. There is no mass.     Tenderness: There is no abdominal tenderness. There is no guarding or rebound.  Musculoskeletal:        General: Tenderness present.  Lymphadenopathy:     Cervical: No cervical adenopathy.  Skin:    Findings: No erythema or rash.  Neurological:     Mental Status: She is oriented to person, place, and time.     Cranial Nerves: No cranial nerve deficit.     Motor: No abnormal muscle tone.      Coordination: Coordination normal.     Deep Tendon Reflexes: Reflexes normal.  Psychiatric:        Behavior: Behavior normal.        Thought Content: Thought content normal.        Judgment: Judgment normal.   cane LE w/fairly nl strength  Lab Results  Component Value Date   WBC 6.1 05/04/2018   HGB 12.2 05/04/2018   HCT 36.2 05/04/2018   PLT 244.0 05/04/2018   GLUCOSE 90 12/15/2018   CHOL 169 05/04/2018   TRIG 158.0 (H) 05/04/2018   HDL 46.00 05/04/2018   LDLCALC 91 05/04/2018   ALT 15 05/04/2018   AST 23 05/04/2018   NA 143 12/15/2018   K 4.0 12/15/2018   CL 103 12/15/2018   CREATININE 1.20 (H) 12/15/2018   BUN 22 12/15/2018   CO2 21 12/15/2018   TSH 1.52 05/04/2018   INR 2.0 (H) 04/25/2017   HGBA1C 5.8 (H) 07/13/2016    Ct Head Wo Contrast  Result Date: 01/29/2019 CLINICAL DATA:  Vertigo. Memory loss. History of bilateral thalamic stroke. EXAM: CT HEAD WITHOUT CONTRAST TECHNIQUE: Contiguous axial images were obtained from the base of the skull through the vertex without intravenous contrast. COMPARISON:  MRI 07/14/2016 FINDINGS: Brain: No evidence of acute infarction, hemorrhage, hydrocephalus, extra-axial collection or mass lesion/mass effect. Prominence of the sulci identified compatible with brain atrophy. Vascular: No hyperdense vessel or unexpected calcification. Skull: Normal. Negative for fracture or focal lesion. Sinuses/Orbits: No acute finding. Other: None IMPRESSION: 1. No acute intracranial abnormality. 2. Mild brain atrophy. Electronically Signed   By: Kerby Moors M.D.   On: 01/29/2019 09:00    Assessment & Plan:   There are no diagnoses linked to this encounter.   No orders of the defined types were placed in this encounter.    Follow-up: No follow-ups on file.  [name], MD

## 2019-02-03 NOTE — Assessment & Plan Note (Signed)
Hold Lipitor CoQ10 VitD

## 2019-02-04 ENCOUNTER — Telehealth: Payer: Self-pay | Admitting: *Deleted

## 2019-02-04 NOTE — Telephone Encounter (Signed)
Called and spoke with daughter about results. She will make sure pt f/u with PCP on a regular basis. She will call back if any further questions/concerns.

## 2019-02-04 NOTE — Telephone Encounter (Signed)
-----   Message from Britt Bottom, MD sent at 01/29/2019  4:32 PM EDT ----- Please let the daughter know that the CT scan showed mild atrophy (slightly more than 2018) but no new findings

## 2019-03-12 ENCOUNTER — Telehealth: Payer: Self-pay

## 2019-03-12 NOTE — Telephone Encounter (Signed)
Left voicemail---not sure what daughter needs----ok to call back on Monday and ask for Stone Spirito,RN at [facility] office

## 2019-03-12 NOTE — Telephone Encounter (Signed)
Copied from [place] [id number]. Topic: General - Other >> Jan 29, 2019 10:54 AM Leward Quan A wrote: Reason for CRM: Patient daughter Sumner Boast called to schedule her a visit with Dr Alain Marion for swelling of the feet and SOB which is increasing but  first available was 02/16/2019 but she is asking for something sooner would like a call back at Ph# [phone] Also [name] is asking for [place] to give her a call >> Jan 29, 2019 12:30 PM Lonia Skinner [place] A wrote: Called back, NO answer, left VM.   Patient may need to be triaged?

## 2019-03-12 NOTE — Telephone Encounter (Signed)
Copied from [place] [id number]. Topic: General - Other >> Mar 12, 2019  2:26 PM Antonieta Iba C wrote: Reason for CRM: pt's daughter called in to ask to speak with Jonelle Sidle, she said that mom has congestive heart failure and Jonelle Sidle has been there for her. She would like to know if Jonelle Sidle would give her a call back

## 2019-03-15 NOTE — Telephone Encounter (Signed)
Pt's daughter called today requesting call back from [place] specifically again

## 2019-03-16 ENCOUNTER — Ambulatory Visit (INDEPENDENT_AMBULATORY_CARE_PROVIDER_SITE_OTHER): Payer: Medicaid Other | Admitting: Internal Medicine

## 2019-03-16 ENCOUNTER — Encounter: Payer: Self-pay | Admitting: Internal Medicine

## 2019-03-16 ENCOUNTER — Other Ambulatory Visit (INDEPENDENT_AMBULATORY_CARE_PROVIDER_SITE_OTHER): Payer: Medicaid Other

## 2019-03-16 ENCOUNTER — Other Ambulatory Visit: Payer: Self-pay

## 2019-03-16 DIAGNOSIS — I5033 Acute on chronic diastolic (congestive) heart failure: Secondary | ICD-10-CM

## 2019-03-16 LAB — BASIC METABOLIC PANEL
BUN: 20 mg/dL (ref 6–23)
CO2: 26 mEq/L (ref 19–32)
Calcium: 8.9 mg/dL (ref 8.4–10.5)
Chloride: 101 mEq/L (ref 96–112)
Creatinine, Ser: 1.12 mg/dL (ref 0.40–1.20)
GFR: 46.47 mL/min — ABNORMAL LOW (ref >60.00)
Glucose, Bld: 101 mg/dL — ABNORMAL HIGH (ref 70–99)
Potassium: 3.1 mEq/L — ABNORMAL LOW (ref 3.5–5.1)
Sodium: 136 mEq/L (ref 135–145)

## 2019-03-16 LAB — BRAIN NATRIURETIC PEPTIDE: Pro B Natriuretic peptide (BNP): 1597 pg/mL — ABNORMAL HIGH (ref 0.0–100.0)

## 2019-03-16 NOTE — Assessment & Plan Note (Signed)
acute on chronic diastolic HF Check BMP, BNP Increase lasix to 40 mg daily x 2 days, then decrease back to 20 mg daily Elevate legs Low sodium diet Call with questions or concerns

## 2019-03-16 NOTE — Patient Instructions (Addendum)
  Tests ordered today. Your results will be released to [place] (or called to you) after review.  If any changes need to be made, you will be notified at that same time.   Medications reviewed and updated.  Changes include :   Take 2 water pills daily in the morning x 2 days, then go back to taking 1 pill daily.     Continue daily weights.  Low sodium diet.     Please call if there is no improvement in your symptoms.

## 2019-03-16 NOTE — Progress Notes (Signed)
Subjective:    Patient ID: Alexis [name], female    DOB: 29-Aug-1933, 85 y.o.   MRN: [id number]  HPI The patient is here for an acute visit.  She is here with her daughter who translates.     They were warned by cardiology that is she had unexplained weight gain she needed to see her doctor right away.   In last week - gaining weight and went from  138 lb to  142 lbs in 5-6 days.  She has also had swelling in her legs.  Her leg swelling was worse three days ago.  She has been elevating her legs.  She is not always compliant with a low sodium diet - she tends to sneak salt.  She has been having increased SOB that has been worse over the past three days.      She complains of weakness and walks minimally.  She is not eating much. She states everything tastes different - that started 3 weeks ago.   She is taking all her medications - takes lasix daily.    Medications and allergies reviewed with patient and updated if appropriate.  Patient Active Problem List   Diagnosis Date Noted  . Leg weakness 02/03/2019  . Long-term (current) use of anticoagulants, INR goal 2.0-3.0 06/26/2017  . Long term current use of anticoagulant therapy 06/13/2017  . Fever 12/03/2016  . Abnormal brain MRI   . Seizures ([place])   . Cerebrovascular disease   . Adjustment disorder with mixed anxiety and depressed mood 07/09/2016  . Medication reaction 07/08/2016  . Streptococcal endocarditis 07/08/2016  . Mitral valve replaced 07/08/2016  . 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([place])   . Atrial fibrillation ([place])   . Shoulder blade pain 06/06/2016  . Eczema 06/06/2016  . Gastroenteritis 06/20/2014  . Orthostatic hypotension 06/20/2014  . Memory loss 08/16/2013  . Encounter for monitoring Coumadin therapy 03/30/2012  . Essential hypertension 05/31/2008  . Coronary atherosclerosis 05/31/2008  . Congestive heart failure ([place]) 05/31/2008  . CONSTIPATION 05/31/2008  . Myalgia 05/31/2008  . Other chest pain  05/31/2008  . Chronic atrial fibrillation 05/31/2008    Current Outpatient Medications on File Prior to Visit  Medication Sig Dispense Refill  . acetaminophen (TYLENOL) 500 MG tablet Take 500 mg by mouth every 6 (six) hours as needed.    [name] atorvastatin (LIPITOR) 10 MG tablet Take 1 tablet (10 mg total) by mouth daily. 90 tablet 3  . b complex vitamins tablet Take 1 tablet by mouth daily. 100 tablet 3  . bisoprolol-hydrochlorothiazide (ZIAC) 2.5-6.25 MG tablet Take 1/2 tablet by mouth daily 45 tablet 3  . Cholecalciferol (VITAMIN D3) 2000 units capsule Take 1 capsule (2,000 Units total) by mouth daily. 100 capsule 3  . donepezil (ARICEPT) 10 MG tablet Take 1 tablet (10 mg total) by mouth at bedtime. 90 tablet 3  . escitalopram (LEXAPRO) 5 MG tablet Take 1 tablet (5 mg total) by mouth daily. 30 tablet 5  . loratadine (CLARITIN) 10 MG tablet Take 10 mg by mouth daily as needed for allergies.    [name] warfarin (COUMADIN) 2.5 MG tablet TAKE 1/2 TABLET BY MOUTH IN THE EVENING ON SUNDAY, TUESDAY, THURSDAY AND SATURDAY, TAKE 1 TABLET ON MONDAY, WEDNESDAY AND FRIDAY 90 tablet 2  . furosemide (LASIX) 20 MG tablet Take 1 tablet (20 mg total) by mouth daily. 90 tablet 3   No current facility-administered medications on file prior to visit.     Past Medical History:  Diagnosis Date  .  Abnormal brain MRI   . Adjustment disorder with mixed anxiety and depressed mood 07/09/2016  . Anxiety   . Atrial fibrillation ([place])   . CAD (coronary artery disease) of artery bypass graft   . Cerebrovascular disease   . Chronic atrial fibrillation ([place]) 05/31/2008   Atrial Fibrillation  . Congestive heart failure ([place]) 05/31/2008   2011 - resolved s/p CABG and valve replacement Altace   . CONSTIPATION 05/31/2008   Qualifier: Diagnosis of  By: Doralee Albino    . Coronary atherosclerosis 05/31/2008   S/p CABG 2011   Coronary Artery Disease  10/1 IMO update  . CVA (cerebral vascular accident) ([place]) 05/2000   "memory issues,  dizziness since" (07/08/2016  . Depression   . Dizzinesses    "often since [facility] 2009" (07/08/2016)  . Eczema 06/06/2016   1/18 back  . Encounter for monitoring Coumadin therapy 03/30/2012   2011   . Endocarditis dx'd 05/2016  . Essential hypertension 05/31/2008   Chronic     . Fever 12/03/2016   2/18 h/o Strep viridance bacteremia in 2/18. PICC line is out.  We may need to remove her 2 remaining teeth  . Foot fracture, left    "no OR; from fall"  . Gastroenteritis 06/20/2014   Likely viral 1/16   . GERD (gastroesophageal reflux disease)    hx  . High cholesterol   . History of blood transfusion ~ 1960  . Hypertension   . Long term current use of anticoagulant therapy 06/13/2017  . Long-term (current) use of anticoagulants, INR goal 2.0-3.0 06/26/2017  . Medication reaction 07/08/2016  . Memory loss 08/16/2013   3/15 mild 2019 Worse Start Aricept  B complex qd  . Mitral valve replaced 07/08/2016  . Myalgia 05/31/2008   Qualifier: Diagnosis of  By: Doralee Albino    . Orthostatic hypotension 06/20/2014   Likely due to viral gastroenteritis 1/16 Hold Ramipril and Spironolactone x 3 days Labs   . Other chest pain 05/31/2008   1/18 L scapula  MSK after raking leaves No CP now  Atypical Chest Pain  . Pneumonia 1-2 X  . Polyarthritis rheumatica ([place])   . 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([place])   . Seizures ([place])    2/18 abn EEG ???thalamic CVA - somnolent On Keppra  . Shoulder blade pain 06/06/2016   1/18 MSK  . 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 Stop date for antibiotic treatment was March 12. Due to multiple reactions and borderline renal function she is now on vancomycin and genta    Past Surgical History:  Procedure Laterality Date  .  AORTIC VALVE REPLACEMENT  03/2008  . CARDIAC VALVE REPLACEMENT    . CATARACT EXTRACTION W/ INTRAOCULAR LENS IMPLANT Left   . CORONARY ANGIOPLASTY  2009  . CORONARY ARTERY BYPASS GRAFT  03/2008  . MITRAL VALVE REPAIR (MV)/CORONARY ARTERY BYPASS GRAFTING (CABG)  03/2008   2009 [facility]  . TEE WITHOUT CARDIOVERSION N/A 06/26/2016   Procedure: TRANSESOPHAGEAL ECHOCARDIOGRAM (TEE);  Surgeon: Josue Hector, MD;  Location: [facility] ENDOSCOPY;  Service: Cardiovascular;  Laterality: N/A;    Social History   Socioeconomic History  . Marital status: Widowed    Spouse name: Not on file  . Number of children: Not on file  . Years of  education: Not on file  . Highest education level: Not on file  Occupational History  . Not on file  Social Needs  . Financial resource strain: Not on file  . Food insecurity    Worry: Not on file    Inability: Not on file  . Transportation needs    Medical: Not on file    Non-medical: Not on file  Tobacco Use  . Smoking status: Never Smoker  . Smokeless tobacco: Never Used  Substance and Sexual Activity  . Alcohol use: No  . Drug use: No  . Sexual activity: Not on file  Lifestyle  . Physical activity    Days per week: Not on file    Minutes per session: Not on file  . Stress: Not on file  Relationships  . Social Herbalist on phone: Not on file    Gets together: Not on file    Attends religious service: Not on file    Active member of club or organization: Not on file    Attends meetings of clubs or organizations: Not on file    Relationship status: Not on file  Other Topics Concern  . Not on file  Social History Narrative   Right handed    Caffeine: drinks 2 cups decaf coffee per day   2 cups tea per day    Family History  Problem Relation Age of Onset  . Stroke Maternal Grandmother   . Lung cancer Brother   . CAD Neg Hx     Review of Systems  Constitutional: Positive for appetite change and fatigue. Negative for fever.   HENT:       Change in taste  Respiratory: Positive for shortness of breath. Negative for cough and wheezing.   Cardiovascular: Positive for leg swelling. Negative for chest pain and palpitations.  Neurological: Negative for light-headedness and headaches.       Objective:   Vitals:   03/16/19 1530  BP: (!) 144/82  Pulse: 69  Resp: 18  Temp: 98.3 F (36.8 C)  SpO2: 98%   BP Readings from Last 3 Encounters:  03/16/19 (!) 144/82  02/03/19 116/78  01/20/19 117/79   Wt Readings from Last 3 Encounters:  03/16/19 145 lb (65.8 kg)  02/03/19 138 lb (62.6 kg)  01/20/19 141 lb 8 oz (64.2 kg)   Body mass index is 30.31 kg/m.   Physical Exam    Constitutional: Appears well-developed and well-nourished. No distress.  HENT:  Head: Normocephalic and atraumatic.  Neck: Neck supple. No tracheal deviation present. No thyromegaly present.  No cervical lymphadenopathy Cardiovascular: Normal rate, regular rhythm and normal heart sounds.  No murmur heard. No carotid bruit .  1 + bl LE non pitting edema Pulmonary/Chest: Effort normal and breath sounds normal. No respiratory distress. No has no wheezes. No rales.  Skin: Skin is warm and dry. Not diaphoretic.  Psychiatric: Normal mood and affect. Behavior is normal.       Assessment & Plan:    See Problem List for Assessment and Plan of chronic medical problems.

## 2019-03-17 ENCOUNTER — Telehealth: Payer: Self-pay

## 2019-03-17 NOTE — Telephone Encounter (Signed)
Daughter is needing information on resources available to help care for her mother----I have talked with daughter, she is ok with bringing mother in for wellness visit with jill/AW Coach---message routed to jill for appt to be scheduled

## 2019-03-17 NOTE — Telephone Encounter (Signed)
Routing to jill/annual wellness coach----patient's daughter is facing lots of Issues with mother, making sure she is caring for her appropriately ---wanted to meet with you for annual wellness visit to get information on additional resources (hopefully free) that could help her with support as her mother ages----daughter is ok with bringing mother in for wellness visit with jill----can you please schedule her for this type of visit?  thanks

## 2019-03-18 ENCOUNTER — Other Ambulatory Visit: Payer: Self-pay | Admitting: Internal Medicine

## 2019-03-18 ENCOUNTER — Telehealth: Payer: Self-pay

## 2019-03-18 MED ORDER — POTASSIUM CHLORIDE CRYS ER 20 MEQ PO TBCR: 20.0000 meq | EXTENDED_RELEASE_TABLET | Freq: Every day | ORAL | 0 refills | Status: DC

## 2019-03-18 NOTE — Telephone Encounter (Signed)
Sent to pharmacy 

## 2019-03-18 NOTE — Telephone Encounter (Signed)
Copied from [place] [id number]. Topic: [name] - Inquiry >> Mar 17, 2019  4:02 PM Mathis Bud wrote: Reason for CRM: Patients daughter Alexis [name] called stating patients new medication(potassiums) is not at pharmacy.  She states Lovena Le called her and let her know it was ordered but the pharmacy does not have the prescription.  [place], [place] - [place] [phone] (Phone) [phone] (Fax)  Call back [phone] >> Mar 18, 2019 10:57 AM Leward Quan A wrote: Patient daughter called back to say that she was at the pharmacy to pick up Rx that was prescribed for potassium but that it was not available. Per chart medication was not sent so she is asking if Dr Quay Burow or Dr Alain Marion can please send the Rx to the pharmacy on file and she will pick up the medication so patient can start taking it. Per daughter she received a call from [facility] that Rx was sent,  Please advise

## 2019-03-22 ENCOUNTER — Telehealth: Payer: Self-pay | Admitting: *Deleted

## 2019-03-22 NOTE — Telephone Encounter (Signed)
Called and LVM for daughter Ms. Kennon Rounds that nurse will put together a packet and mail it to the patient's address in regards to community resources that may provide care and assistance to the patient. Nurse did leave her callback number for daughter to call her for any further questions or concerns.

## 2019-04-13 ENCOUNTER — Ambulatory Visit: Payer: Self-pay

## 2019-04-13 DIAGNOSIS — Z20828 Contact with and (suspected) exposure to other viral communicable diseases: Secondary | ICD-10-CM | POA: Diagnosis not present

## 2019-04-13 DIAGNOSIS — F329 Major depressive disorder, single episode, unspecified: Secondary | ICD-10-CM | POA: Diagnosis not present

## 2019-04-13 DIAGNOSIS — Z7901 Long term (current) use of anticoagulants: Secondary | ICD-10-CM | POA: Diagnosis not present

## 2019-04-13 DIAGNOSIS — I482 Chronic atrial fibrillation, unspecified: Secondary | ICD-10-CM | POA: Diagnosis not present

## 2019-04-13 DIAGNOSIS — R8290 Unspecified abnormal findings in urine: Secondary | ICD-10-CM | POA: Insufficient documentation

## 2019-04-13 DIAGNOSIS — E876 Hypokalemia: Secondary | ICD-10-CM | POA: Diagnosis not present

## 2019-04-13 DIAGNOSIS — I5033 Acute on chronic diastolic (congestive) heart failure: Secondary | ICD-10-CM | POA: Diagnosis not present

## 2019-04-13 DIAGNOSIS — R413 Other amnesia: Secondary | ICD-10-CM | POA: Diagnosis not present

## 2019-04-13 DIAGNOSIS — R0789 Other chest pain: Secondary | ICD-10-CM | POA: Diagnosis not present

## 2019-04-13 DIAGNOSIS — R5383 Other fatigue: Secondary | ICD-10-CM | POA: Diagnosis not present

## 2019-04-13 DIAGNOSIS — I517 Cardiomegaly: Secondary | ICD-10-CM | POA: Diagnosis not present

## 2019-04-13 DIAGNOSIS — R9431 Abnormal electrocardiogram [ECG] [EKG]: Secondary | ICD-10-CM | POA: Diagnosis not present

## 2019-04-13 DIAGNOSIS — I10 Essential (primary) hypertension: Secondary | ICD-10-CM | POA: Diagnosis not present

## 2019-04-13 DIAGNOSIS — R079 Chest pain, unspecified: Secondary | ICD-10-CM | POA: Diagnosis not present

## 2019-04-13 DIAGNOSIS — R0602 Shortness of breath: Secondary | ICD-10-CM | POA: Diagnosis not present

## 2019-04-13 DIAGNOSIS — Z951 Presence of aortocoronary bypass graft: Secondary | ICD-10-CM | POA: Diagnosis not present

## 2019-04-13 DIAGNOSIS — R531 Weakness: Secondary | ICD-10-CM | POA: Diagnosis not present

## 2019-04-13 DIAGNOSIS — I509 Heart failure, unspecified: Secondary | ICD-10-CM | POA: Diagnosis not present

## 2019-04-13 NOTE — Telephone Encounter (Signed)
Incoming call from Patients daughter  who States that Patient will not eat.  Will only lay down in bed does not want to sit up in chair.  Alert and oriented.  oOnly eats  White bread.  Drinks herbal  Tea and decaffinated tea.Patient temp is 96.4 Recommended Pt. Be Transferred  To Ed for further evaluation.  Daughter voiced understanding.  Will Call EMS.  Depression is noted also.           Reason for Disposition . Nursing judgment, per information in Reference  Answer Assessment - Initial Assessment Questions 1. REASON FOR CALL: "What is your main concern right now?"      Pt wont eat, depression.   2. ONSET: "When did the *No Answer* start?"     august 3. SEVERITY: "How bad is the *No Answer*?"     Mod to severe 4. FEVER: "Do you have a fever?"     dosent think so 5. OTHER SYMPTOMS: "Do you have any other new symptoms?"      6. INTERVENTIONS AND RESPONSE: "What have you done so far to try to make this better? What medications have you used?"     Tried to encourage 7. PREGNANCY: "Is there any chance you are pregnant?"     na  Protocols used: NO GUIDELINE AVAILABLE-A-AH

## 2019-04-13 NOTE — Telephone Encounter (Signed)
FYI

## 2019-04-14 DIAGNOSIS — I25709 Atherosclerosis of coronary artery bypass graft(s), unspecified, with unspecified angina pectoris: Secondary | ICD-10-CM | POA: Diagnosis not present

## 2019-04-14 DIAGNOSIS — Z7901 Long term (current) use of anticoagulants: Secondary | ICD-10-CM | POA: Diagnosis not present

## 2019-04-14 DIAGNOSIS — I509 Heart failure, unspecified: Secondary | ICD-10-CM | POA: Diagnosis not present

## 2019-04-14 DIAGNOSIS — Z8673 Personal history of transient ischemic attack (TIA), and cerebral infarction without residual deficits: Secondary | ICD-10-CM | POA: Diagnosis not present

## 2019-04-14 DIAGNOSIS — R413 Other amnesia: Secondary | ICD-10-CM | POA: Diagnosis not present

## 2019-04-14 DIAGNOSIS — I5033 Acute on chronic diastolic (congestive) heart failure: Secondary | ICD-10-CM | POA: Diagnosis not present

## 2019-04-14 DIAGNOSIS — I38 Endocarditis, valve unspecified: Secondary | ICD-10-CM | POA: Insufficient documentation

## 2019-04-14 DIAGNOSIS — I2510 Atherosclerotic heart disease of native coronary artery without angina pectoris: Secondary | ICD-10-CM | POA: Diagnosis not present

## 2019-04-14 DIAGNOSIS — Z952 Presence of prosthetic heart valve: Secondary | ICD-10-CM | POA: Diagnosis not present

## 2019-04-14 DIAGNOSIS — I110 Hypertensive heart disease with heart failure: Secondary | ICD-10-CM | POA: Diagnosis not present

## 2019-04-14 DIAGNOSIS — I482 Chronic atrial fibrillation, unspecified: Secondary | ICD-10-CM | POA: Diagnosis not present

## 2019-04-14 DIAGNOSIS — Z9889 Other specified postprocedural states: Secondary | ICD-10-CM | POA: Diagnosis not present

## 2019-04-14 DIAGNOSIS — I10 Essential (primary) hypertension: Secondary | ICD-10-CM | POA: Diagnosis not present

## 2019-04-14 DIAGNOSIS — I5043 Acute on chronic combined systolic (congestive) and diastolic (congestive) heart failure: Secondary | ICD-10-CM | POA: Diagnosis not present

## 2019-04-14 DIAGNOSIS — Z951 Presence of aortocoronary bypass graft: Secondary | ICD-10-CM | POA: Diagnosis not present

## 2019-04-14 DIAGNOSIS — E876 Hypokalemia: Secondary | ICD-10-CM | POA: Diagnosis not present

## 2019-04-14 DIAGNOSIS — F418 Other specified anxiety disorders: Secondary | ICD-10-CM | POA: Diagnosis not present

## 2019-04-14 DIAGNOSIS — Z79899 Other long term (current) drug therapy: Secondary | ICD-10-CM | POA: Diagnosis not present

## 2019-04-14 NOTE — Telephone Encounter (Signed)
Agree with emergency department evaluation.  Thanks

## 2019-04-15 DIAGNOSIS — I350 Nonrheumatic aortic (valve) stenosis: Secondary | ICD-10-CM | POA: Diagnosis not present

## 2019-04-15 DIAGNOSIS — I083 Combined rheumatic disorders of mitral, aortic and tricuspid valves: Secondary | ICD-10-CM | POA: Diagnosis not present

## 2019-04-15 DIAGNOSIS — Z952 Presence of prosthetic heart valve: Secondary | ICD-10-CM | POA: Diagnosis not present

## 2019-04-15 DIAGNOSIS — D259 Leiomyoma of uterus, unspecified: Secondary | ICD-10-CM | POA: Diagnosis not present

## 2019-04-15 DIAGNOSIS — I272 Pulmonary hypertension, unspecified: Secondary | ICD-10-CM | POA: Diagnosis not present

## 2019-04-15 DIAGNOSIS — Z954 Presence of other heart-valve replacement: Secondary | ICD-10-CM | POA: Diagnosis not present

## 2019-04-15 DIAGNOSIS — I517 Cardiomegaly: Secondary | ICD-10-CM | POA: Diagnosis not present

## 2019-04-15 DIAGNOSIS — J8410 Pulmonary fibrosis, unspecified: Secondary | ICD-10-CM | POA: Diagnosis not present

## 2019-04-15 DIAGNOSIS — J90 Pleural effusion, not elsewhere classified: Secondary | ICD-10-CM | POA: Diagnosis not present

## 2019-04-15 MED ORDER — FUROSEMIDE 10 MG/ML IJ SOLN
40.00 | INTRAMUSCULAR | Status: DC
Start: 2019-04-14 — End: 2019-04-15

## 2019-04-15 MED ORDER — POLYETHYLENE GLYCOL 3350 17 G PO PACK
17.00 | PACK | ORAL | Status: DC
Start: ? — End: 2019-04-15

## 2019-04-15 MED ORDER — CYCLOBENZAPRINE HCL 10 MG PO TABS
10.00 | ORAL_TABLET | ORAL | Status: DC
Start: ? — End: 2019-04-15

## 2019-04-15 MED ORDER — METOPROLOL SUCCINATE ER 50 MG PO TB24
50.00 | ORAL_TABLET | ORAL | Status: DC
Start: 2019-04-15 — End: 2019-04-15

## 2019-04-15 MED ORDER — PRAVASTATIN SODIUM 40 MG PO TABS
40.00 | ORAL_TABLET | ORAL | Status: DC
Start: ? — End: 2019-04-15

## 2019-04-15 MED ORDER — GENERIC EXTERNAL MEDICATION
1.00 | Status: DC
Start: 2019-04-14 — End: 2019-04-15

## 2019-04-15 MED ORDER — GENERIC EXTERNAL MEDICATION
Status: DC
Start: ? — End: 2019-04-15

## 2019-04-15 MED ORDER — WARFARIN SODIUM 2.5 MG PO TABS
2.50 | ORAL_TABLET | ORAL | Status: DC
Start: ? — End: 2019-04-15

## 2019-04-15 MED ORDER — SPIRONOLACTONE 25 MG PO TABS
25.00 | ORAL_TABLET | ORAL | Status: DC
Start: 2019-04-15 — End: 2019-04-15

## 2019-04-15 MED ORDER — HYDROCODONE-ACETAMINOPHEN 5-325 MG PO TABS
1.00 | ORAL_TABLET | ORAL | Status: DC
Start: ? — End: 2019-04-15

## 2019-04-15 MED ORDER — LORATADINE 10 MG PO TABS
10.00 | ORAL_TABLET | ORAL | Status: DC
Start: 2019-04-15 — End: 2019-04-15

## 2019-04-15 MED ORDER — ALBUTEROL SULFATE (2.5 MG/3ML) 0.083% IN NEBU
2.50 | INHALATION_SOLUTION | RESPIRATORY_TRACT | Status: DC
Start: ? — End: 2019-04-15

## 2019-04-15 MED ORDER — GENERIC EXTERNAL MEDICATION
1.25 | Status: DC
Start: ? — End: 2019-04-15

## 2019-04-15 MED ORDER — ALUM & MAG HYDROXIDE-SIMETH 200-200-20 MG/5ML PO SUSP
30.00 | ORAL | Status: DC
Start: ? — End: 2019-04-15

## 2019-04-15 MED ORDER — NITROGLYCERIN 0.4 MG SL SUBL
0.40 | SUBLINGUAL_TABLET | SUBLINGUAL | Status: DC
Start: ? — End: 2019-04-15

## 2019-04-15 MED ORDER — POTASSIUM CHLORIDE CRYS ER 20 MEQ PO TBCR
40.00 | EXTENDED_RELEASE_TABLET | ORAL | Status: DC
Start: 2019-04-15 — End: 2019-04-15

## 2019-04-15 MED ORDER — ACETAMINOPHEN 325 MG PO TABS
650.00 | ORAL_TABLET | ORAL | Status: DC
Start: ? — End: 2019-04-15

## 2019-04-15 MED ORDER — SODIUM CHLORIDE 0.9 % IV SOLN
10.00 | INTRAVENOUS | Status: DC
Start: ? — End: 2019-04-15

## 2019-04-15 MED ORDER — LISINOPRIL 10 MG PO TABS
10.00 | ORAL_TABLET | ORAL | Status: DC
Start: 2019-04-15 — End: 2019-04-15

## 2019-04-16 DIAGNOSIS — I071 Rheumatic tricuspid insufficiency: Secondary | ICD-10-CM | POA: Diagnosis not present

## 2019-04-16 DIAGNOSIS — Z954 Presence of other heart-valve replacement: Secondary | ICD-10-CM | POA: Diagnosis not present

## 2019-04-16 DIAGNOSIS — I2510 Atherosclerotic heart disease of native coronary artery without angina pectoris: Secondary | ICD-10-CM | POA: Diagnosis not present

## 2019-04-16 DIAGNOSIS — I517 Cardiomegaly: Secondary | ICD-10-CM | POA: Diagnosis not present

## 2019-04-16 DIAGNOSIS — I081 Rheumatic disorders of both mitral and tricuspid valves: Secondary | ICD-10-CM | POA: Diagnosis not present

## 2019-04-16 DIAGNOSIS — I110 Hypertensive heart disease with heart failure: Secondary | ICD-10-CM | POA: Diagnosis not present

## 2019-04-16 DIAGNOSIS — I5030 Unspecified diastolic (congestive) heart failure: Secondary | ICD-10-CM | POA: Diagnosis not present

## 2019-04-16 DIAGNOSIS — Z952 Presence of prosthetic heart valve: Secondary | ICD-10-CM | POA: Diagnosis not present

## 2019-04-16 DIAGNOSIS — Z8673 Personal history of transient ischemic attack (TIA), and cerebral infarction without residual deficits: Secondary | ICD-10-CM | POA: Diagnosis not present

## 2019-04-17 DIAGNOSIS — Z952 Presence of prosthetic heart valve: Secondary | ICD-10-CM | POA: Diagnosis not present

## 2019-04-18 DIAGNOSIS — Z952 Presence of prosthetic heart valve: Secondary | ICD-10-CM | POA: Diagnosis not present

## 2019-04-19 DIAGNOSIS — Z952 Presence of prosthetic heart valve: Secondary | ICD-10-CM | POA: Diagnosis not present

## 2019-04-20 DIAGNOSIS — I6523 Occlusion and stenosis of bilateral carotid arteries: Secondary | ICD-10-CM | POA: Diagnosis not present

## 2019-04-20 DIAGNOSIS — Z952 Presence of prosthetic heart valve: Secondary | ICD-10-CM | POA: Diagnosis not present

## 2019-04-20 DIAGNOSIS — Z01818 Encounter for other preprocedural examination: Secondary | ICD-10-CM | POA: Diagnosis not present

## 2019-04-21 DIAGNOSIS — I340 Nonrheumatic mitral (valve) insufficiency: Secondary | ICD-10-CM | POA: Diagnosis not present

## 2019-04-22 DIAGNOSIS — I340 Nonrheumatic mitral (valve) insufficiency: Secondary | ICD-10-CM | POA: Diagnosis not present

## 2019-04-23 DIAGNOSIS — I340 Nonrheumatic mitral (valve) insufficiency: Secondary | ICD-10-CM | POA: Diagnosis not present

## 2019-04-24 DIAGNOSIS — Z952 Presence of prosthetic heart valve: Secondary | ICD-10-CM | POA: Diagnosis not present

## 2019-04-26 DIAGNOSIS — Z952 Presence of prosthetic heart valve: Secondary | ICD-10-CM | POA: Diagnosis not present

## 2019-04-27 DIAGNOSIS — Z952 Presence of prosthetic heart valve: Secondary | ICD-10-CM | POA: Diagnosis not present

## 2019-04-28 DIAGNOSIS — I340 Nonrheumatic mitral (valve) insufficiency: Secondary | ICD-10-CM | POA: Insufficient documentation

## 2019-04-29 ENCOUNTER — Other Ambulatory Visit: Payer: Self-pay | Admitting: Internal Medicine

## 2019-04-29 MED ORDER — Medication
40.00 | Status: DC
Start: 2019-04-28 — End: 2019-04-29

## 2019-04-29 MED ORDER — Medication
Status: DC
Start: ? — End: 2019-04-29

## 2019-04-29 MED ORDER — BARO-CAT PO
10.00 | ORAL | Status: DC
Start: ? — End: 2019-04-29

## 2019-04-29 MED ORDER — VORICONAZOLE 200 MG IV SOLR
5.00 | INTRAVENOUS | Status: DC
Start: ? — End: 2019-04-29

## 2019-04-29 MED ORDER — APAP PO
20.00 | ORAL | Status: DC
Start: 2019-04-28 — End: 2019-04-29

## 2019-04-29 MED ORDER — POTASSIUM CHLORIDE CRYS ER 20 MEQ PO TBCR: 20.0000 meq | EXTENDED_RELEASE_TABLET | Freq: Every day | ORAL | 1 refills | Status: DC

## 2019-04-29 MED ORDER — ANTACID 311-232 MG OR CAPS
1.00 | ORAL_CAPSULE | ORAL | Status: DC
Start: ? — End: 2019-04-29

## 2019-04-29 MED ORDER — METOPROLOL SUCCINATE ER 50 MG PO TB24
50.00 | ORAL_TABLET | ORAL | Status: DC
Start: 2019-04-29 — End: 2019-04-29

## 2019-04-29 MED ORDER — Medication
5.00 | Status: DC
Start: 2019-04-28 — End: 2019-04-29

## 2019-04-29 MED ORDER — CHLOROBUTANOL-EUGENOL & APAP
0.40 | Status: DC
Start: ? — End: 2019-04-29

## 2019-04-29 MED ORDER — Medication
5.00 | Status: DC
Start: 2019-04-29 — End: 2019-04-29

## 2019-04-29 MED ORDER — ALPRAZOLAM 0.25 MG PO TABS
0.25 | ORAL_TABLET | ORAL | Status: DC
Start: ? — End: 2019-04-29

## 2019-04-29 MED ORDER — LACTOBACILLUS PO
10.00 | ORAL | Status: DC
Start: 2019-04-28 — End: 2019-04-29

## 2019-04-29 MED ORDER — QUINERVA 260 MG PO TABS
650.00 | ORAL_TABLET | ORAL | Status: DC
Start: ? — End: 2019-04-29

## 2019-04-29 MED ORDER — CHLOROPHYLL EX
10.00 | CUTANEOUS | Status: DC
Start: ? — End: 2019-04-29

## 2019-04-29 MED ORDER — ONE-A-DAY WITHIN PO
30.00 | ORAL | Status: DC
Start: ? — End: 2019-04-29

## 2019-04-29 MED ORDER — DEWITTS PAIN RELIEVER 325 MG PO TABS
10.00 | ORAL_TABLET | ORAL | Status: DC
Start: ? — End: 2019-04-29

## 2019-04-29 MED ORDER — WAL-FEX 180 MG PO TABS
10.00 | ORAL_TABLET | ORAL | Status: DC
Start: ? — End: 2019-04-29

## 2019-04-29 MED ORDER — PHENYLEPH-POT GUAIACOLSULF
81.00 | Status: DC
Start: 2019-04-29 — End: 2019-04-29

## 2019-04-29 MED ORDER — MONOJECT SYRINGE ECC LUER 35 ML MISC
2.50 | Status: DC
Start: 2019-04-28 — End: 2019-04-29

## 2019-04-30 ENCOUNTER — Telehealth: Payer: Self-pay | Admitting: Internal Medicine

## 2019-04-30 DIAGNOSIS — M1990 Unspecified osteoarthritis, unspecified site: Secondary | ICD-10-CM | POA: Diagnosis not present

## 2019-04-30 DIAGNOSIS — Z7901 Long term (current) use of anticoagulants: Secondary | ICD-10-CM | POA: Diagnosis not present

## 2019-04-30 DIAGNOSIS — T8209XD Other mechanical complication of heart valve prosthesis, subsequent encounter: Secondary | ICD-10-CM | POA: Diagnosis not present

## 2019-04-30 DIAGNOSIS — I272 Pulmonary hypertension, unspecified: Secondary | ICD-10-CM | POA: Diagnosis not present

## 2019-04-30 DIAGNOSIS — I071 Rheumatic tricuspid insufficiency: Secondary | ICD-10-CM | POA: Diagnosis not present

## 2019-04-30 DIAGNOSIS — I5043 Acute on chronic combined systolic (congestive) and diastolic (congestive) heart failure: Secondary | ICD-10-CM | POA: Diagnosis not present

## 2019-04-30 DIAGNOSIS — I110 Hypertensive heart disease with heart failure: Secondary | ICD-10-CM | POA: Diagnosis not present

## 2019-04-30 DIAGNOSIS — G3184 Mild cognitive impairment, so stated: Secondary | ICD-10-CM | POA: Diagnosis not present

## 2019-04-30 DIAGNOSIS — F418 Other specified anxiety disorders: Secondary | ICD-10-CM | POA: Diagnosis not present

## 2019-04-30 DIAGNOSIS — E8770 Fluid overload, unspecified: Secondary | ICD-10-CM | POA: Diagnosis not present

## 2019-04-30 DIAGNOSIS — I25810 Atherosclerosis of coronary artery bypass graft(s) without angina pectoris: Secondary | ICD-10-CM | POA: Diagnosis not present

## 2019-04-30 DIAGNOSIS — I2510 Atherosclerotic heart disease of native coronary artery without angina pectoris: Secondary | ICD-10-CM | POA: Diagnosis not present

## 2019-04-30 NOTE — Telephone Encounter (Signed)
Verbals given, FYI 

## 2019-04-30 NOTE — Telephone Encounter (Signed)
[facility] called in for  VO for home health services.   Frequency: 1 visit today and 2 next week   Please advise   CB: [phone]

## 2019-05-03 NOTE — Telephone Encounter (Signed)
Thx

## 2019-05-04 DIAGNOSIS — E8770 Fluid overload, unspecified: Secondary | ICD-10-CM | POA: Diagnosis not present

## 2019-05-04 DIAGNOSIS — I071 Rheumatic tricuspid insufficiency: Secondary | ICD-10-CM | POA: Diagnosis not present

## 2019-05-04 DIAGNOSIS — Z7901 Long term (current) use of anticoagulants: Secondary | ICD-10-CM | POA: Diagnosis not present

## 2019-05-04 DIAGNOSIS — T8209XD Other mechanical complication of heart valve prosthesis, subsequent encounter: Secondary | ICD-10-CM | POA: Diagnosis not present

## 2019-05-04 DIAGNOSIS — G3184 Mild cognitive impairment, so stated: Secondary | ICD-10-CM | POA: Diagnosis not present

## 2019-05-04 DIAGNOSIS — F418 Other specified anxiety disorders: Secondary | ICD-10-CM | POA: Diagnosis not present

## 2019-05-04 DIAGNOSIS — I25810 Atherosclerosis of coronary artery bypass graft(s) without angina pectoris: Secondary | ICD-10-CM | POA: Diagnosis not present

## 2019-05-04 DIAGNOSIS — M1990 Unspecified osteoarthritis, unspecified site: Secondary | ICD-10-CM | POA: Diagnosis not present

## 2019-05-04 DIAGNOSIS — I5043 Acute on chronic combined systolic (congestive) and diastolic (congestive) heart failure: Secondary | ICD-10-CM | POA: Diagnosis not present

## 2019-05-04 DIAGNOSIS — I2510 Atherosclerotic heart disease of native coronary artery without angina pectoris: Secondary | ICD-10-CM | POA: Diagnosis not present

## 2019-05-04 DIAGNOSIS — I110 Hypertensive heart disease with heart failure: Secondary | ICD-10-CM | POA: Diagnosis not present

## 2019-05-04 DIAGNOSIS — I272 Pulmonary hypertension, unspecified: Secondary | ICD-10-CM | POA: Diagnosis not present

## 2019-05-04 DIAGNOSIS — I4819 Other persistent atrial fibrillation: Secondary | ICD-10-CM | POA: Diagnosis not present

## 2019-05-05 DIAGNOSIS — Z952 Presence of prosthetic heart valve: Secondary | ICD-10-CM | POA: Diagnosis not present

## 2019-05-05 DIAGNOSIS — I482 Chronic atrial fibrillation, unspecified: Secondary | ICD-10-CM | POA: Diagnosis not present

## 2019-05-05 DIAGNOSIS — I5032 Chronic diastolic (congestive) heart failure: Secondary | ICD-10-CM | POA: Diagnosis not present

## 2019-05-05 DIAGNOSIS — I2510 Atherosclerotic heart disease of native coronary artery without angina pectoris: Secondary | ICD-10-CM | POA: Diagnosis not present

## 2019-05-05 DIAGNOSIS — Z7901 Long term (current) use of anticoagulants: Secondary | ICD-10-CM | POA: Diagnosis not present

## 2019-05-05 DIAGNOSIS — Z951 Presence of aortocoronary bypass graft: Secondary | ICD-10-CM | POA: Diagnosis not present

## 2019-05-05 DIAGNOSIS — I340 Nonrheumatic mitral (valve) insufficiency: Secondary | ICD-10-CM | POA: Diagnosis not present

## 2019-05-06 DIAGNOSIS — R1312 Dysphagia, oropharyngeal phase: Secondary | ICD-10-CM | POA: Diagnosis not present

## 2019-05-06 DIAGNOSIS — R1310 Dysphagia, unspecified: Secondary | ICD-10-CM | POA: Diagnosis not present

## 2019-05-06 DIAGNOSIS — R05 Cough: Secondary | ICD-10-CM | POA: Diagnosis not present

## 2019-05-06 DIAGNOSIS — J392 Other diseases of pharynx: Secondary | ICD-10-CM | POA: Diagnosis not present

## 2019-05-13 DIAGNOSIS — I4819 Other persistent atrial fibrillation: Secondary | ICD-10-CM | POA: Diagnosis not present

## 2019-05-13 DIAGNOSIS — T8209XD Other mechanical complication of heart valve prosthesis, subsequent encounter: Secondary | ICD-10-CM | POA: Diagnosis not present

## 2019-05-13 DIAGNOSIS — I272 Pulmonary hypertension, unspecified: Secondary | ICD-10-CM | POA: Diagnosis not present

## 2019-05-13 DIAGNOSIS — I110 Hypertensive heart disease with heart failure: Secondary | ICD-10-CM | POA: Diagnosis not present

## 2019-05-13 DIAGNOSIS — M1990 Unspecified osteoarthritis, unspecified site: Secondary | ICD-10-CM | POA: Diagnosis not present

## 2019-05-13 DIAGNOSIS — E8770 Fluid overload, unspecified: Secondary | ICD-10-CM | POA: Diagnosis not present

## 2019-05-13 DIAGNOSIS — Z7901 Long term (current) use of anticoagulants: Secondary | ICD-10-CM | POA: Diagnosis not present

## 2019-05-13 DIAGNOSIS — I2510 Atherosclerotic heart disease of native coronary artery without angina pectoris: Secondary | ICD-10-CM | POA: Diagnosis not present

## 2019-05-13 DIAGNOSIS — I25810 Atherosclerosis of coronary artery bypass graft(s) without angina pectoris: Secondary | ICD-10-CM | POA: Diagnosis not present

## 2019-05-13 DIAGNOSIS — I071 Rheumatic tricuspid insufficiency: Secondary | ICD-10-CM | POA: Diagnosis not present

## 2019-05-13 DIAGNOSIS — I5043 Acute on chronic combined systolic (congestive) and diastolic (congestive) heart failure: Secondary | ICD-10-CM | POA: Diagnosis not present

## 2019-05-13 DIAGNOSIS — F418 Other specified anxiety disorders: Secondary | ICD-10-CM | POA: Diagnosis not present

## 2019-05-13 DIAGNOSIS — G3184 Mild cognitive impairment, so stated: Secondary | ICD-10-CM | POA: Diagnosis not present

## 2019-05-27 DIAGNOSIS — Z20828 Contact with and (suspected) exposure to other viral communicable diseases: Secondary | ICD-10-CM | POA: Diagnosis not present

## 2019-05-27 DIAGNOSIS — I4891 Unspecified atrial fibrillation: Secondary | ICD-10-CM | POA: Diagnosis not present

## 2019-05-27 DIAGNOSIS — Z01811 Encounter for preprocedural respiratory examination: Secondary | ICD-10-CM | POA: Diagnosis not present

## 2019-05-27 DIAGNOSIS — Z01812 Encounter for preprocedural laboratory examination: Secondary | ICD-10-CM | POA: Diagnosis not present

## 2019-05-27 DIAGNOSIS — Z01810 Encounter for preprocedural cardiovascular examination: Secondary | ICD-10-CM | POA: Diagnosis not present

## 2019-05-27 DIAGNOSIS — I350 Nonrheumatic aortic (valve) stenosis: Secondary | ICD-10-CM | POA: Diagnosis not present

## 2019-05-27 DIAGNOSIS — Z01818 Encounter for other preprocedural examination: Secondary | ICD-10-CM | POA: Diagnosis not present

## 2019-05-27 DIAGNOSIS — J90 Pleural effusion, not elsewhere classified: Secondary | ICD-10-CM | POA: Diagnosis not present

## 2019-06-01 DIAGNOSIS — Z954 Presence of other heart-valve replacement: Secondary | ICD-10-CM | POA: Diagnosis not present

## 2019-06-01 DIAGNOSIS — Z952 Presence of prosthetic heart valve: Secondary | ICD-10-CM | POA: Diagnosis not present

## 2019-06-01 DIAGNOSIS — J90 Pleural effusion, not elsewhere classified: Secondary | ICD-10-CM | POA: Diagnosis not present

## 2019-06-01 DIAGNOSIS — I083 Combined rheumatic disorders of mitral, aortic and tricuspid valves: Secondary | ICD-10-CM | POA: Diagnosis not present

## 2019-06-01 DIAGNOSIS — I340 Nonrheumatic mitral (valve) insufficiency: Secondary | ICD-10-CM | POA: Diagnosis not present

## 2019-06-01 DIAGNOSIS — R0989 Other specified symptoms and signs involving the circulatory and respiratory systems: Secondary | ICD-10-CM | POA: Diagnosis not present

## 2019-06-01 DIAGNOSIS — I517 Cardiomegaly: Secondary | ICD-10-CM | POA: Diagnosis not present

## 2019-06-02 DIAGNOSIS — I340 Nonrheumatic mitral (valve) insufficiency: Secondary | ICD-10-CM | POA: Diagnosis not present

## 2019-06-02 DIAGNOSIS — Z952 Presence of prosthetic heart valve: Secondary | ICD-10-CM | POA: Diagnosis not present

## 2019-06-02 DIAGNOSIS — I071 Rheumatic tricuspid insufficiency: Secondary | ICD-10-CM | POA: Diagnosis not present

## 2019-06-02 DIAGNOSIS — R918 Other nonspecific abnormal finding of lung field: Secondary | ICD-10-CM | POA: Diagnosis not present

## 2019-06-02 DIAGNOSIS — J939 Pneumothorax, unspecified: Secondary | ICD-10-CM | POA: Diagnosis not present

## 2019-06-02 DIAGNOSIS — Z954 Presence of other heart-valve replacement: Secondary | ICD-10-CM | POA: Diagnosis not present

## 2019-06-02 DIAGNOSIS — Z9889 Other specified postprocedural states: Secondary | ICD-10-CM | POA: Diagnosis not present

## 2019-06-02 DIAGNOSIS — J90 Pleural effusion, not elsewhere classified: Secondary | ICD-10-CM | POA: Diagnosis not present

## 2019-06-02 DIAGNOSIS — I517 Cardiomegaly: Secondary | ICD-10-CM | POA: Diagnosis not present

## 2019-06-03 DIAGNOSIS — I4891 Unspecified atrial fibrillation: Secondary | ICD-10-CM | POA: Diagnosis not present

## 2019-06-03 DIAGNOSIS — Z9889 Other specified postprocedural states: Secondary | ICD-10-CM | POA: Diagnosis not present

## 2019-06-03 DIAGNOSIS — I5043 Acute on chronic combined systolic (congestive) and diastolic (congestive) heart failure: Secondary | ICD-10-CM | POA: Diagnosis not present

## 2019-06-03 DIAGNOSIS — Z7901 Long term (current) use of anticoagulants: Secondary | ICD-10-CM | POA: Diagnosis not present

## 2019-06-04 DIAGNOSIS — I340 Nonrheumatic mitral (valve) insufficiency: Secondary | ICD-10-CM | POA: Diagnosis not present

## 2019-06-05 MED ORDER — MIDAZOLAM HCL 2 MG/2ML IJ SOLN
1.00 | INTRAMUSCULAR | Status: DC
Start: ? — End: 2019-06-05

## 2019-06-05 MED ORDER — METOPROLOL SUCCINATE ER 50 MG PO TB24
50.00 | ORAL_TABLET | ORAL | Status: DC
Start: 2019-06-05 — End: 2019-06-05

## 2019-06-05 MED ORDER — MAGNESIUM HYDROXIDE 400 MG/5ML PO SUSP
30.00 | ORAL | Status: DC
Start: ? — End: 2019-06-05

## 2019-06-05 MED ORDER — GENERIC EXTERNAL MEDICATION
Status: DC
Start: ? — End: 2019-06-05

## 2019-06-05 MED ORDER — EPINEPHRINE HCL-DEXTROSE 4-5 MG/250ML-% IV SOLN
0.10 | INTRAVENOUS | Status: DC
Start: ? — End: 2019-06-05

## 2019-06-05 MED ORDER — ESCITALOPRAM OXALATE 10 MG PO TABS
5.00 | ORAL_TABLET | ORAL | Status: DC
Start: 2019-06-05 — End: 2019-06-05

## 2019-06-05 MED ORDER — FUROSEMIDE 20 MG PO TABS
40.00 | ORAL_TABLET | ORAL | Status: DC
Start: ? — End: 2019-06-05

## 2019-06-05 MED ORDER — ASPIRIN 81 MG PO CHEW
81.00 | CHEWABLE_TABLET | ORAL | Status: DC
Start: 2019-06-05 — End: 2019-06-05

## 2019-06-05 MED ORDER — GENERIC EXTERNAL MEDICATION
1.00 | Status: DC
Start: ? — End: 2019-06-05

## 2019-06-05 MED ORDER — MUPIROCIN 2 % EX OINT
0.25 | TOPICAL_OINTMENT | CUTANEOUS | Status: DC
Start: 2019-06-04 — End: 2019-06-05

## 2019-06-05 MED ORDER — PROTAMINE SULFATE 10 MG/ML IV SOLN
25.00 | INTRAVENOUS | Status: DC
Start: ? — End: 2019-06-05

## 2019-06-05 MED ORDER — SODIUM CHLORIDE 0.9 % IV SOLN
10.00 | INTRAVENOUS | Status: DC
Start: ? — End: 2019-06-05

## 2019-06-05 MED ORDER — FENTANYL CITRATE (PF) 2500 MCG/50ML IJ SOLN
12.50 | INTRAMUSCULAR | Status: DC
Start: ? — End: 2019-06-05

## 2019-06-05 MED ORDER — WARFARIN SODIUM 1 MG PO TABS
1.00 | ORAL_TABLET | ORAL | Status: DC
Start: 2019-06-04 — End: 2019-06-05

## 2019-06-05 MED ORDER — FENTANYL CITRATE (PF) 2500 MCG/50ML IJ SOLN
25.00 | INTRAMUSCULAR | Status: DC
Start: ? — End: 2019-06-05

## 2019-06-05 MED ORDER — DONEPEZIL HCL 10 MG PO TABS
10.00 | ORAL_TABLET | ORAL | Status: DC
Start: 2019-06-04 — End: 2019-06-05

## 2019-06-05 MED ORDER — CHLORHEXIDINE GLUCONATE 0.12 % MT SOLN
15.00 | OROMUCOSAL | Status: DC
Start: 2019-06-04 — End: 2019-06-05

## 2019-06-05 MED ORDER — PHENYLEPHRINE HCL-NACL 20-0.9 MG/250ML-% IV SOLN
1.00 | INTRAVENOUS | Status: DC
Start: ? — End: 2019-06-05

## 2019-06-05 MED ORDER — GENERIC EXTERNAL MEDICATION
150.00 | Status: DC
Start: ? — End: 2019-06-05

## 2019-06-05 MED ORDER — METOPROLOL TARTRATE 5 MG/5ML IV SOLN
1.00 | INTRAVENOUS | Status: DC
Start: ? — End: 2019-06-05

## 2019-06-05 MED ORDER — POTASSIUM CHLORIDE ER 10 MEQ PO TBCR
10.00 | EXTENDED_RELEASE_TABLET | ORAL | Status: DC
Start: 2019-06-04 — End: 2019-06-05

## 2019-06-05 MED ORDER — BISACODYL 10 MG RE SUPP
10.00 | RECTAL | Status: DC
Start: ? — End: 2019-06-05

## 2019-06-05 MED ORDER — DSS 100 MG PO CAPS
100.00 | ORAL_CAPSULE | ORAL | Status: DC
Start: 2019-06-04 — End: 2019-06-05

## 2019-06-06 DIAGNOSIS — I2699 Other pulmonary embolism without acute cor pulmonale: Secondary | ICD-10-CM | POA: Diagnosis not present

## 2019-06-06 DIAGNOSIS — R0602 Shortness of breath: Secondary | ICD-10-CM | POA: Diagnosis not present

## 2019-06-06 DIAGNOSIS — I5033 Acute on chronic diastolic (congestive) heart failure: Secondary | ICD-10-CM | POA: Diagnosis not present

## 2019-06-06 DIAGNOSIS — J90 Pleural effusion, not elsewhere classified: Secondary | ICD-10-CM | POA: Diagnosis not present

## 2019-06-06 DIAGNOSIS — R079 Chest pain, unspecified: Secondary | ICD-10-CM | POA: Diagnosis not present

## 2019-06-06 DIAGNOSIS — J189 Pneumonia, unspecified organism: Secondary | ICD-10-CM | POA: Diagnosis not present

## 2019-06-06 DIAGNOSIS — I110 Hypertensive heart disease with heart failure: Secondary | ICD-10-CM | POA: Diagnosis not present

## 2019-06-06 DIAGNOSIS — R918 Other nonspecific abnormal finding of lung field: Secondary | ICD-10-CM | POA: Diagnosis not present

## 2019-06-07 DIAGNOSIS — I482 Chronic atrial fibrillation, unspecified: Secondary | ICD-10-CM | POA: Diagnosis not present

## 2019-06-07 DIAGNOSIS — J156 Pneumonia due to other aerobic Gram-negative bacteria: Secondary | ICD-10-CM | POA: Diagnosis not present

## 2019-06-07 DIAGNOSIS — I2699 Other pulmonary embolism without acute cor pulmonale: Secondary | ICD-10-CM | POA: Diagnosis not present

## 2019-06-07 DIAGNOSIS — I5033 Acute on chronic diastolic (congestive) heart failure: Secondary | ICD-10-CM | POA: Diagnosis not present

## 2019-06-07 DIAGNOSIS — Z952 Presence of prosthetic heart valve: Secondary | ICD-10-CM | POA: Diagnosis not present

## 2019-06-07 DIAGNOSIS — R7989 Other specified abnormal findings of blood chemistry: Secondary | ICD-10-CM | POA: Insufficient documentation

## 2019-06-07 DIAGNOSIS — Z951 Presence of aortocoronary bypass graft: Secondary | ICD-10-CM | POA: Diagnosis not present

## 2019-06-07 DIAGNOSIS — J1569 Pneumonia due to other gram-negative bacteria: Secondary | ICD-10-CM | POA: Insufficient documentation

## 2019-06-07 DIAGNOSIS — R071 Chest pain on breathing: Secondary | ICD-10-CM | POA: Diagnosis not present

## 2019-06-07 DIAGNOSIS — Z7901 Long term (current) use of anticoagulants: Secondary | ICD-10-CM | POA: Diagnosis not present

## 2019-06-07 DIAGNOSIS — R778 Other specified abnormalities of plasma proteins: Secondary | ICD-10-CM | POA: Diagnosis not present

## 2019-06-07 DIAGNOSIS — I272 Pulmonary hypertension, unspecified: Secondary | ICD-10-CM | POA: Diagnosis not present

## 2019-06-08 DIAGNOSIS — Z952 Presence of prosthetic heart valve: Secondary | ICD-10-CM | POA: Diagnosis not present

## 2019-06-08 DIAGNOSIS — I071 Rheumatic tricuspid insufficiency: Secondary | ICD-10-CM | POA: Diagnosis not present

## 2019-06-08 DIAGNOSIS — I517 Cardiomegaly: Secondary | ICD-10-CM | POA: Diagnosis not present

## 2019-06-08 DIAGNOSIS — R778 Other specified abnormalities of plasma proteins: Secondary | ICD-10-CM | POA: Diagnosis not present

## 2019-06-08 DIAGNOSIS — I2699 Other pulmonary embolism without acute cor pulmonale: Secondary | ICD-10-CM | POA: Diagnosis not present

## 2019-06-08 DIAGNOSIS — I5033 Acute on chronic diastolic (congestive) heart failure: Secondary | ICD-10-CM | POA: Diagnosis not present

## 2019-06-08 DIAGNOSIS — I272 Pulmonary hypertension, unspecified: Secondary | ICD-10-CM | POA: Diagnosis not present

## 2019-06-08 DIAGNOSIS — Z7901 Long term (current) use of anticoagulants: Secondary | ICD-10-CM | POA: Diagnosis not present

## 2019-06-08 DIAGNOSIS — R071 Chest pain on breathing: Secondary | ICD-10-CM | POA: Diagnosis not present

## 2019-06-08 DIAGNOSIS — I482 Chronic atrial fibrillation, unspecified: Secondary | ICD-10-CM | POA: Diagnosis not present

## 2019-06-08 DIAGNOSIS — Z954 Presence of other heart-valve replacement: Secondary | ICD-10-CM | POA: Diagnosis not present

## 2019-06-08 DIAGNOSIS — Z9889 Other specified postprocedural states: Secondary | ICD-10-CM | POA: Diagnosis not present

## 2019-06-08 DIAGNOSIS — R4182 Altered mental status, unspecified: Secondary | ICD-10-CM | POA: Diagnosis not present

## 2019-06-08 DIAGNOSIS — J156 Pneumonia due to other aerobic Gram-negative bacteria: Secondary | ICD-10-CM | POA: Diagnosis not present

## 2019-06-09 DIAGNOSIS — R071 Chest pain on breathing: Secondary | ICD-10-CM | POA: Diagnosis not present

## 2019-06-09 DIAGNOSIS — I5043 Acute on chronic combined systolic (congestive) and diastolic (congestive) heart failure: Secondary | ICD-10-CM | POA: Diagnosis not present

## 2019-06-09 DIAGNOSIS — Z952 Presence of prosthetic heart valve: Secondary | ICD-10-CM | POA: Diagnosis not present

## 2019-06-09 DIAGNOSIS — Z7189 Other specified counseling: Secondary | ICD-10-CM | POA: Diagnosis not present

## 2019-06-09 DIAGNOSIS — J90 Pleural effusion, not elsewhere classified: Secondary | ICD-10-CM | POA: Diagnosis not present

## 2019-06-09 DIAGNOSIS — I272 Pulmonary hypertension, unspecified: Secondary | ICD-10-CM | POA: Diagnosis not present

## 2019-06-09 DIAGNOSIS — I5033 Acute on chronic diastolic (congestive) heart failure: Secondary | ICD-10-CM | POA: Diagnosis not present

## 2019-06-09 DIAGNOSIS — I482 Chronic atrial fibrillation, unspecified: Secondary | ICD-10-CM | POA: Diagnosis not present

## 2019-06-09 DIAGNOSIS — Z7901 Long term (current) use of anticoagulants: Secondary | ICD-10-CM | POA: Diagnosis not present

## 2019-06-09 DIAGNOSIS — R778 Other specified abnormalities of plasma proteins: Secondary | ICD-10-CM | POA: Diagnosis not present

## 2019-06-09 DIAGNOSIS — Z515 Encounter for palliative care: Secondary | ICD-10-CM | POA: Diagnosis not present

## 2019-06-09 DIAGNOSIS — R413 Other amnesia: Secondary | ICD-10-CM | POA: Diagnosis not present

## 2019-06-09 DIAGNOSIS — I2699 Other pulmonary embolism without acute cor pulmonale: Secondary | ICD-10-CM | POA: Diagnosis not present

## 2019-06-09 DIAGNOSIS — J156 Pneumonia due to other aerobic Gram-negative bacteria: Secondary | ICD-10-CM | POA: Diagnosis not present

## 2019-06-09 DIAGNOSIS — Z9889 Other specified postprocedural states: Secondary | ICD-10-CM | POA: Diagnosis not present

## 2019-06-10 DIAGNOSIS — I5033 Acute on chronic diastolic (congestive) heart failure: Secondary | ICD-10-CM | POA: Diagnosis not present

## 2019-06-11 ENCOUNTER — Telehealth: Payer: Self-pay | Admitting: Internal Medicine

## 2019-06-11 DIAGNOSIS — I5033 Acute on chronic diastolic (congestive) heart failure: Secondary | ICD-10-CM | POA: Diagnosis not present

## 2019-06-11 NOTE — Telephone Encounter (Signed)
Appointment scheduled for 1/25   Copied from [place] [id number]. Topic: General - Inquiry >> Jun 11, 2019  9:45 AM Richardo Priest, NT wrote: Reason for CRM: Pt's daughter called in stating her mother is in the hospital of [facility] and daughter wants to make sure office can see hospital notes before proceeding with an appointment. Please advise.

## 2019-06-11 NOTE — Telephone Encounter (Signed)
Daughter notified we can see records

## 2019-06-12 MED ORDER — POTASSIUM CHLORIDE CRYS ER 20 MEQ PO TBCR
20.00 | EXTENDED_RELEASE_TABLET | ORAL | Status: DC
Start: 2019-06-12 — End: 2019-06-12

## 2019-06-12 MED ORDER — CYCLOBENZAPRINE HCL 10 MG PO TABS
10.00 | ORAL_TABLET | ORAL | Status: DC
Start: ? — End: 2019-06-12

## 2019-06-12 MED ORDER — MIRTAZAPINE 15 MG PO TABS
7.50 | ORAL_TABLET | ORAL | Status: DC
Start: ? — End: 2019-06-12

## 2019-06-12 MED ORDER — GENERIC EXTERNAL MEDICATION
Status: DC
Start: ? — End: 2019-06-12

## 2019-06-12 MED ORDER — HYDROCODONE-ACETAMINOPHEN 5-325 MG PO TABS
1.00 | ORAL_TABLET | ORAL | Status: DC
Start: ? — End: 2019-06-12

## 2019-06-12 MED ORDER — LISINOPRIL 10 MG PO TABS
10.00 | ORAL_TABLET | ORAL | Status: DC
Start: 2019-06-12 — End: 2019-06-12

## 2019-06-12 MED ORDER — GENERIC EXTERNAL MEDICATION
10.00 | Status: DC
Start: ? — End: 2019-06-12

## 2019-06-12 MED ORDER — NITROGLYCERIN 0.4 MG SL SUBL
0.40 | SUBLINGUAL_TABLET | SUBLINGUAL | Status: DC
Start: ? — End: 2019-06-12

## 2019-06-12 MED ORDER — ALUM & MAG HYDROXIDE-SIMETH 200-200-20 MG/5ML PO SUSP
30.00 | ORAL | Status: DC
Start: ? — End: 2019-06-12

## 2019-06-12 MED ORDER — PRAVASTATIN SODIUM 40 MG PO TABS
40.00 | ORAL_TABLET | ORAL | Status: DC
Start: 2019-06-11 — End: 2019-06-12

## 2019-06-12 MED ORDER — GENERIC EXTERNAL MEDICATION
3.38 | Status: DC
Start: 2019-06-11 — End: 2019-06-12

## 2019-06-12 MED ORDER — LORATADINE 10 MG PO TABS
10.00 | ORAL_TABLET | ORAL | Status: DC
Start: 2019-06-12 — End: 2019-06-12

## 2019-06-12 MED ORDER — MELATONIN 1 MG PO TABS
1.00 | ORAL_TABLET | ORAL | Status: DC
Start: 2019-06-11 — End: 2019-06-12

## 2019-06-12 MED ORDER — OLANZAPINE 5 MG PO TBDP
2.50 | ORAL_TABLET | ORAL | Status: DC
Start: 2019-06-11 — End: 2019-06-12

## 2019-06-12 MED ORDER — METOPROLOL SUCCINATE ER 50 MG PO TB24
50.00 | ORAL_TABLET | ORAL | Status: DC
Start: 2019-06-12 — End: 2019-06-12

## 2019-06-12 MED ORDER — FUROSEMIDE 40 MG PO TABS
40.00 | ORAL_TABLET | ORAL | Status: DC
Start: 2019-06-12 — End: 2019-06-12

## 2019-06-12 MED ORDER — ASPIRIN 81 MG PO CHEW
81.00 | CHEWABLE_TABLET | ORAL | Status: DC
Start: 2019-06-12 — End: 2019-06-12

## 2019-06-12 MED ORDER — ALBUTEROL SULFATE (2.5 MG/3ML) 0.083% IN NEBU
2.50 | INHALATION_SOLUTION | RESPIRATORY_TRACT | Status: DC
Start: ? — End: 2019-06-12

## 2019-06-12 MED ORDER — CHOLECALCIFEROL 25 MCG (1000 UT) PO TABS
2000.00 | ORAL_TABLET | ORAL | Status: DC
Start: 2019-06-12 — End: 2019-06-12

## 2019-06-12 MED ORDER — SODIUM CHLORIDE 0.9 % IV SOLN
10.00 | INTRAVENOUS | Status: DC
Start: ? — End: 2019-06-12

## 2019-06-12 MED ORDER — ASCORBIC ACID 500 MG PO TABS
250.00 | ORAL_TABLET | ORAL | Status: DC
Start: 2019-06-12 — End: 2019-06-12

## 2019-06-12 MED ORDER — POLYETHYLENE GLYCOL 3350 17 GM/SCOOP PO POWD
17.00 | ORAL | Status: DC
Start: ? — End: 2019-06-12

## 2019-06-12 MED ORDER — WARFARIN SODIUM 2.5 MG PO TABS
2.50 | ORAL_TABLET | ORAL | Status: DC
Start: ? — End: 2019-06-12

## 2019-06-15 DIAGNOSIS — I2699 Other pulmonary embolism without acute cor pulmonale: Secondary | ICD-10-CM | POA: Diagnosis not present

## 2019-06-15 DIAGNOSIS — I071 Rheumatic tricuspid insufficiency: Secondary | ICD-10-CM | POA: Diagnosis not present

## 2019-06-15 DIAGNOSIS — I272 Pulmonary hypertension, unspecified: Secondary | ICD-10-CM | POA: Diagnosis not present

## 2019-06-15 DIAGNOSIS — G3184 Mild cognitive impairment, so stated: Secondary | ICD-10-CM | POA: Diagnosis not present

## 2019-06-15 DIAGNOSIS — I2510 Atherosclerotic heart disease of native coronary artery without angina pectoris: Secondary | ICD-10-CM | POA: Diagnosis not present

## 2019-06-15 DIAGNOSIS — T8209XD Other mechanical complication of heart valve prosthesis, subsequent encounter: Secondary | ICD-10-CM | POA: Diagnosis not present

## 2019-06-15 DIAGNOSIS — I25810 Atherosclerosis of coronary artery bypass graft(s) without angina pectoris: Secondary | ICD-10-CM | POA: Diagnosis not present

## 2019-06-15 DIAGNOSIS — F418 Other specified anxiety disorders: Secondary | ICD-10-CM | POA: Diagnosis not present

## 2019-06-15 DIAGNOSIS — M1990 Unspecified osteoarthritis, unspecified site: Secondary | ICD-10-CM | POA: Diagnosis not present

## 2019-06-15 DIAGNOSIS — I4819 Other persistent atrial fibrillation: Secondary | ICD-10-CM | POA: Diagnosis not present

## 2019-06-15 DIAGNOSIS — Z7901 Long term (current) use of anticoagulants: Secondary | ICD-10-CM | POA: Diagnosis not present

## 2019-06-15 DIAGNOSIS — E8770 Fluid overload, unspecified: Secondary | ICD-10-CM | POA: Diagnosis not present

## 2019-06-15 DIAGNOSIS — I5043 Acute on chronic combined systolic (congestive) and diastolic (congestive) heart failure: Secondary | ICD-10-CM | POA: Diagnosis not present

## 2019-06-16 ENCOUNTER — Telehealth: Payer: Self-pay | Admitting: Internal Medicine

## 2019-06-16 NOTE — Telephone Encounter (Signed)
Lattie Haw calling with [facility] called and stated that she has faxed [place] with orders and would like to know if they can be signed.   Lattie Haw states that the pt can not afford all home health and would like to change the order.

## 2019-06-16 NOTE — Telephone Encounter (Signed)
error 

## 2019-06-17 ENCOUNTER — Telehealth: Payer: Self-pay

## 2019-06-17 NOTE — Telephone Encounter (Signed)
Alexis [name] [phone] [facility].  Saw pt Tuesday, 06/15/19. Need to know if [facility] will order Plan of Care for nursing one visit biweekly for six weeks. Alexis [name] states fu needed due to mitral valve replacement surgery & multiple hospital visits.

## 2019-06-18 NOTE — Telephone Encounter (Signed)
Home health has called back.  I have given MD response.

## 2019-06-18 NOTE — Telephone Encounter (Signed)
Yes.  Thank you.

## 2019-06-21 ENCOUNTER — Inpatient Hospital Stay: Payer: Self-pay | Admitting: Internal Medicine

## 2019-06-28 DIAGNOSIS — M1990 Unspecified osteoarthritis, unspecified site: Secondary | ICD-10-CM | POA: Diagnosis not present

## 2019-06-28 DIAGNOSIS — I5043 Acute on chronic combined systolic (congestive) and diastolic (congestive) heart failure: Secondary | ICD-10-CM | POA: Diagnosis not present

## 2019-06-28 DIAGNOSIS — I2699 Other pulmonary embolism without acute cor pulmonale: Secondary | ICD-10-CM | POA: Diagnosis not present

## 2019-06-28 DIAGNOSIS — E8770 Fluid overload, unspecified: Secondary | ICD-10-CM | POA: Diagnosis not present

## 2019-06-28 DIAGNOSIS — I25810 Atherosclerosis of coronary artery bypass graft(s) without angina pectoris: Secondary | ICD-10-CM | POA: Diagnosis not present

## 2019-06-28 DIAGNOSIS — I4819 Other persistent atrial fibrillation: Secondary | ICD-10-CM | POA: Diagnosis not present

## 2019-06-28 DIAGNOSIS — I2510 Atherosclerotic heart disease of native coronary artery without angina pectoris: Secondary | ICD-10-CM | POA: Diagnosis not present

## 2019-06-28 DIAGNOSIS — I071 Rheumatic tricuspid insufficiency: Secondary | ICD-10-CM | POA: Diagnosis not present

## 2019-06-28 DIAGNOSIS — F418 Other specified anxiety disorders: Secondary | ICD-10-CM | POA: Diagnosis not present

## 2019-06-28 DIAGNOSIS — Z7901 Long term (current) use of anticoagulants: Secondary | ICD-10-CM | POA: Diagnosis not present

## 2019-06-28 DIAGNOSIS — G3184 Mild cognitive impairment, so stated: Secondary | ICD-10-CM | POA: Diagnosis not present

## 2019-06-28 DIAGNOSIS — I482 Chronic atrial fibrillation, unspecified: Secondary | ICD-10-CM | POA: Diagnosis not present

## 2019-06-28 DIAGNOSIS — T8209XD Other mechanical complication of heart valve prosthesis, subsequent encounter: Secondary | ICD-10-CM | POA: Diagnosis not present

## 2019-06-28 DIAGNOSIS — I272 Pulmonary hypertension, unspecified: Secondary | ICD-10-CM | POA: Diagnosis not present

## 2019-07-09 DIAGNOSIS — Z952 Presence of prosthetic heart valve: Secondary | ICD-10-CM | POA: Diagnosis not present

## 2019-07-09 DIAGNOSIS — I082 Rheumatic disorders of both aortic and tricuspid valves: Secondary | ICD-10-CM | POA: Diagnosis not present

## 2019-07-09 DIAGNOSIS — Z9889 Other specified postprocedural states: Secondary | ICD-10-CM | POA: Diagnosis not present

## 2019-07-09 DIAGNOSIS — I5033 Acute on chronic diastolic (congestive) heart failure: Secondary | ICD-10-CM | POA: Diagnosis not present

## 2019-07-09 DIAGNOSIS — I482 Chronic atrial fibrillation, unspecified: Secondary | ICD-10-CM | POA: Diagnosis not present

## 2019-07-09 DIAGNOSIS — I517 Cardiomegaly: Secondary | ICD-10-CM | POA: Diagnosis not present

## 2019-07-12 ENCOUNTER — Inpatient Hospital Stay: Payer: Self-pay | Admitting: Internal Medicine

## 2019-07-13 DIAGNOSIS — N183 Chronic kidney disease, stage 3 unspecified: Secondary | ICD-10-CM | POA: Diagnosis not present

## 2019-07-13 DIAGNOSIS — I5189 Other ill-defined heart diseases: Secondary | ICD-10-CM | POA: Insufficient documentation

## 2019-07-13 DIAGNOSIS — S91301D Unspecified open wound, right foot, subsequent encounter: Secondary | ICD-10-CM | POA: Diagnosis not present

## 2019-07-13 DIAGNOSIS — I739 Peripheral vascular disease, unspecified: Secondary | ICD-10-CM | POA: Insufficient documentation

## 2019-07-13 DIAGNOSIS — I5033 Acute on chronic diastolic (congestive) heart failure: Secondary | ICD-10-CM | POA: Diagnosis not present

## 2019-07-13 DIAGNOSIS — L89613 Pressure ulcer of right heel, stage 3: Secondary | ICD-10-CM | POA: Insufficient documentation

## 2019-07-13 DIAGNOSIS — I519 Heart disease, unspecified: Secondary | ICD-10-CM | POA: Diagnosis not present

## 2019-07-13 DIAGNOSIS — I482 Chronic atrial fibrillation, unspecified: Secondary | ICD-10-CM | POA: Diagnosis not present

## 2019-07-13 DIAGNOSIS — Z7901 Long term (current) use of anticoagulants: Secondary | ICD-10-CM | POA: Diagnosis not present

## 2019-07-14 DIAGNOSIS — I739 Peripheral vascular disease, unspecified: Secondary | ICD-10-CM | POA: Diagnosis not present

## 2019-07-14 DIAGNOSIS — L97411 Non-pressure chronic ulcer of right heel and midfoot limited to breakdown of skin: Secondary | ICD-10-CM | POA: Diagnosis not present

## 2019-07-18 DIAGNOSIS — I5043 Acute on chronic combined systolic (congestive) and diastolic (congestive) heart failure: Secondary | ICD-10-CM | POA: Diagnosis not present

## 2019-07-18 DIAGNOSIS — Z7901 Long term (current) use of anticoagulants: Secondary | ICD-10-CM | POA: Diagnosis not present

## 2019-07-18 DIAGNOSIS — T8209XD Other mechanical complication of heart valve prosthesis, subsequent encounter: Secondary | ICD-10-CM | POA: Diagnosis not present

## 2019-07-18 DIAGNOSIS — G3184 Mild cognitive impairment, so stated: Secondary | ICD-10-CM | POA: Diagnosis not present

## 2019-07-18 DIAGNOSIS — I2510 Atherosclerotic heart disease of native coronary artery without angina pectoris: Secondary | ICD-10-CM | POA: Diagnosis not present

## 2019-07-18 DIAGNOSIS — E8770 Fluid overload, unspecified: Secondary | ICD-10-CM | POA: Diagnosis not present

## 2019-07-18 DIAGNOSIS — I25810 Atherosclerosis of coronary artery bypass graft(s) without angina pectoris: Secondary | ICD-10-CM | POA: Diagnosis not present

## 2019-07-18 DIAGNOSIS — F418 Other specified anxiety disorders: Secondary | ICD-10-CM | POA: Diagnosis not present

## 2019-07-18 DIAGNOSIS — I2699 Other pulmonary embolism without acute cor pulmonale: Secondary | ICD-10-CM | POA: Diagnosis not present

## 2019-07-18 DIAGNOSIS — I071 Rheumatic tricuspid insufficiency: Secondary | ICD-10-CM | POA: Diagnosis not present

## 2019-07-18 DIAGNOSIS — I272 Pulmonary hypertension, unspecified: Secondary | ICD-10-CM | POA: Diagnosis not present

## 2019-07-18 DIAGNOSIS — M1990 Unspecified osteoarthritis, unspecified site: Secondary | ICD-10-CM | POA: Diagnosis not present

## 2019-07-18 DIAGNOSIS — I4819 Other persistent atrial fibrillation: Secondary | ICD-10-CM | POA: Diagnosis not present

## 2019-07-20 DIAGNOSIS — I482 Chronic atrial fibrillation, unspecified: Secondary | ICD-10-CM | POA: Diagnosis not present

## 2019-07-20 DIAGNOSIS — M6281 Muscle weakness (generalized): Secondary | ICD-10-CM | POA: Diagnosis not present

## 2019-07-20 DIAGNOSIS — N183 Chronic kidney disease, stage 3 unspecified: Secondary | ICD-10-CM | POA: Diagnosis not present

## 2019-07-20 DIAGNOSIS — S91301D Unspecified open wound, right foot, subsequent encounter: Secondary | ICD-10-CM | POA: Diagnosis not present

## 2019-07-20 DIAGNOSIS — I739 Peripheral vascular disease, unspecified: Secondary | ICD-10-CM | POA: Diagnosis not present

## 2019-07-23 DIAGNOSIS — I2510 Atherosclerotic heart disease of native coronary artery without angina pectoris: Secondary | ICD-10-CM | POA: Diagnosis not present

## 2019-07-23 DIAGNOSIS — I25810 Atherosclerosis of coronary artery bypass graft(s) without angina pectoris: Secondary | ICD-10-CM | POA: Diagnosis not present

## 2019-07-23 DIAGNOSIS — Z7901 Long term (current) use of anticoagulants: Secondary | ICD-10-CM | POA: Diagnosis not present

## 2019-07-23 DIAGNOSIS — G3184 Mild cognitive impairment, so stated: Secondary | ICD-10-CM | POA: Diagnosis not present

## 2019-07-23 DIAGNOSIS — M1990 Unspecified osteoarthritis, unspecified site: Secondary | ICD-10-CM | POA: Diagnosis not present

## 2019-07-23 DIAGNOSIS — I4819 Other persistent atrial fibrillation: Secondary | ICD-10-CM | POA: Diagnosis not present

## 2019-07-23 DIAGNOSIS — I272 Pulmonary hypertension, unspecified: Secondary | ICD-10-CM | POA: Diagnosis not present

## 2019-07-23 DIAGNOSIS — E8770 Fluid overload, unspecified: Secondary | ICD-10-CM | POA: Diagnosis not present

## 2019-07-23 DIAGNOSIS — I071 Rheumatic tricuspid insufficiency: Secondary | ICD-10-CM | POA: Diagnosis not present

## 2019-07-23 DIAGNOSIS — T8209XD Other mechanical complication of heart valve prosthesis, subsequent encounter: Secondary | ICD-10-CM | POA: Diagnosis not present

## 2019-07-23 DIAGNOSIS — F418 Other specified anxiety disorders: Secondary | ICD-10-CM | POA: Diagnosis not present

## 2019-07-23 DIAGNOSIS — I2699 Other pulmonary embolism without acute cor pulmonale: Secondary | ICD-10-CM | POA: Diagnosis not present

## 2019-07-23 DIAGNOSIS — I5043 Acute on chronic combined systolic (congestive) and diastolic (congestive) heart failure: Secondary | ICD-10-CM | POA: Diagnosis not present

## 2019-07-27 DIAGNOSIS — I739 Peripheral vascular disease, unspecified: Secondary | ICD-10-CM | POA: Diagnosis not present

## 2019-07-27 DIAGNOSIS — I482 Chronic atrial fibrillation, unspecified: Secondary | ICD-10-CM | POA: Diagnosis not present

## 2019-07-27 DIAGNOSIS — Z7901 Long term (current) use of anticoagulants: Secondary | ICD-10-CM | POA: Diagnosis not present

## 2019-07-27 DIAGNOSIS — S91301D Unspecified open wound, right foot, subsequent encounter: Secondary | ICD-10-CM | POA: Diagnosis not present

## 2019-07-30 DIAGNOSIS — F418 Other specified anxiety disorders: Secondary | ICD-10-CM | POA: Diagnosis not present

## 2019-07-30 DIAGNOSIS — I2699 Other pulmonary embolism without acute cor pulmonale: Secondary | ICD-10-CM | POA: Diagnosis not present

## 2019-07-30 DIAGNOSIS — I5043 Acute on chronic combined systolic (congestive) and diastolic (congestive) heart failure: Secondary | ICD-10-CM | POA: Diagnosis not present

## 2019-07-30 DIAGNOSIS — I4819 Other persistent atrial fibrillation: Secondary | ICD-10-CM | POA: Diagnosis not present

## 2019-07-30 DIAGNOSIS — I071 Rheumatic tricuspid insufficiency: Secondary | ICD-10-CM | POA: Diagnosis not present

## 2019-07-30 DIAGNOSIS — T8209XD Other mechanical complication of heart valve prosthesis, subsequent encounter: Secondary | ICD-10-CM | POA: Diagnosis not present

## 2019-07-30 DIAGNOSIS — G3184 Mild cognitive impairment, so stated: Secondary | ICD-10-CM | POA: Diagnosis not present

## 2019-07-30 DIAGNOSIS — I25810 Atherosclerosis of coronary artery bypass graft(s) without angina pectoris: Secondary | ICD-10-CM | POA: Diagnosis not present

## 2019-07-30 DIAGNOSIS — I2510 Atherosclerotic heart disease of native coronary artery without angina pectoris: Secondary | ICD-10-CM | POA: Diagnosis not present

## 2019-07-30 DIAGNOSIS — I272 Pulmonary hypertension, unspecified: Secondary | ICD-10-CM | POA: Diagnosis not present

## 2019-07-30 DIAGNOSIS — M1990 Unspecified osteoarthritis, unspecified site: Secondary | ICD-10-CM | POA: Diagnosis not present

## 2019-07-30 DIAGNOSIS — E8770 Fluid overload, unspecified: Secondary | ICD-10-CM | POA: Diagnosis not present

## 2019-07-30 DIAGNOSIS — Z7901 Long term (current) use of anticoagulants: Secondary | ICD-10-CM | POA: Diagnosis not present

## 2019-08-02 ENCOUNTER — Inpatient Hospital Stay: Payer: Self-pay | Admitting: Internal Medicine

## 2019-08-02 DIAGNOSIS — Z7982 Long term (current) use of aspirin: Secondary | ICD-10-CM | POA: Diagnosis not present

## 2019-08-02 DIAGNOSIS — Z79899 Other long term (current) drug therapy: Secondary | ICD-10-CM | POA: Diagnosis not present

## 2019-08-02 DIAGNOSIS — Z7901 Long term (current) use of anticoagulants: Secondary | ICD-10-CM | POA: Diagnosis not present

## 2019-08-02 DIAGNOSIS — I130 Hypertensive heart and chronic kidney disease with heart failure and stage 1 through stage 4 chronic kidney disease, or unspecified chronic kidney disease: Secondary | ICD-10-CM | POA: Diagnosis not present

## 2019-08-02 DIAGNOSIS — Z8614 Personal history of Methicillin resistant Staphylococcus aureus infection: Secondary | ICD-10-CM | POA: Diagnosis not present

## 2019-08-02 DIAGNOSIS — I7092 Chronic total occlusion of artery of the extremities: Secondary | ICD-10-CM | POA: Diagnosis not present

## 2019-08-02 DIAGNOSIS — N183 Chronic kidney disease, stage 3 unspecified: Secondary | ICD-10-CM | POA: Diagnosis not present

## 2019-08-02 DIAGNOSIS — Z86711 Personal history of pulmonary embolism: Secondary | ICD-10-CM | POA: Diagnosis not present

## 2019-08-02 DIAGNOSIS — I739 Peripheral vascular disease, unspecified: Secondary | ICD-10-CM | POA: Diagnosis not present

## 2019-08-02 DIAGNOSIS — F4323 Adjustment disorder with mixed anxiety and depressed mood: Secondary | ICD-10-CM | POA: Diagnosis not present

## 2019-08-02 DIAGNOSIS — Z888 Allergy status to other drugs, medicaments and biological substances status: Secondary | ICD-10-CM | POA: Diagnosis not present

## 2019-08-02 DIAGNOSIS — L89613 Pressure ulcer of right heel, stage 3: Secondary | ICD-10-CM | POA: Diagnosis not present

## 2019-08-02 DIAGNOSIS — Z952 Presence of prosthetic heart valve: Secondary | ICD-10-CM | POA: Diagnosis not present

## 2019-08-02 DIAGNOSIS — I482 Chronic atrial fibrillation, unspecified: Secondary | ICD-10-CM | POA: Diagnosis not present

## 2019-08-02 DIAGNOSIS — I70234 Atherosclerosis of native arteries of right leg with ulceration of heel and midfoot: Secondary | ICD-10-CM | POA: Diagnosis not present

## 2019-08-02 DIAGNOSIS — I2510 Atherosclerotic heart disease of native coronary artery without angina pectoris: Secondary | ICD-10-CM | POA: Diagnosis not present

## 2019-08-02 DIAGNOSIS — I509 Heart failure, unspecified: Secondary | ICD-10-CM | POA: Diagnosis not present

## 2019-08-02 DIAGNOSIS — Z951 Presence of aortocoronary bypass graft: Secondary | ICD-10-CM | POA: Diagnosis not present

## 2019-08-03 DIAGNOSIS — I2510 Atherosclerotic heart disease of native coronary artery without angina pectoris: Secondary | ICD-10-CM | POA: Diagnosis not present

## 2019-08-03 DIAGNOSIS — I5043 Acute on chronic combined systolic (congestive) and diastolic (congestive) heart failure: Secondary | ICD-10-CM | POA: Diagnosis not present

## 2019-08-03 DIAGNOSIS — E8770 Fluid overload, unspecified: Secondary | ICD-10-CM | POA: Diagnosis not present

## 2019-08-03 DIAGNOSIS — I4819 Other persistent atrial fibrillation: Secondary | ICD-10-CM | POA: Diagnosis not present

## 2019-08-03 DIAGNOSIS — I2699 Other pulmonary embolism without acute cor pulmonale: Secondary | ICD-10-CM | POA: Diagnosis not present

## 2019-08-03 DIAGNOSIS — G3184 Mild cognitive impairment, so stated: Secondary | ICD-10-CM | POA: Diagnosis not present

## 2019-08-03 DIAGNOSIS — I071 Rheumatic tricuspid insufficiency: Secondary | ICD-10-CM | POA: Diagnosis not present

## 2019-08-03 DIAGNOSIS — Z7901 Long term (current) use of anticoagulants: Secondary | ICD-10-CM | POA: Diagnosis not present

## 2019-08-03 DIAGNOSIS — T8209XD Other mechanical complication of heart valve prosthesis, subsequent encounter: Secondary | ICD-10-CM | POA: Diagnosis not present

## 2019-08-03 DIAGNOSIS — F418 Other specified anxiety disorders: Secondary | ICD-10-CM | POA: Diagnosis not present

## 2019-08-03 DIAGNOSIS — I25810 Atherosclerosis of coronary artery bypass graft(s) without angina pectoris: Secondary | ICD-10-CM | POA: Diagnosis not present

## 2019-08-03 DIAGNOSIS — M1990 Unspecified osteoarthritis, unspecified site: Secondary | ICD-10-CM | POA: Diagnosis not present

## 2019-08-03 DIAGNOSIS — I272 Pulmonary hypertension, unspecified: Secondary | ICD-10-CM | POA: Diagnosis not present

## 2019-08-04 DIAGNOSIS — N183 Chronic kidney disease, stage 3 unspecified: Secondary | ICD-10-CM | POA: Diagnosis not present

## 2019-08-04 DIAGNOSIS — I739 Peripheral vascular disease, unspecified: Secondary | ICD-10-CM | POA: Diagnosis not present

## 2019-08-04 DIAGNOSIS — L89613 Pressure ulcer of right heel, stage 3: Secondary | ICD-10-CM | POA: Diagnosis not present

## 2019-08-06 DIAGNOSIS — I2699 Other pulmonary embolism without acute cor pulmonale: Secondary | ICD-10-CM | POA: Diagnosis not present

## 2019-08-06 DIAGNOSIS — Z7901 Long term (current) use of anticoagulants: Secondary | ICD-10-CM | POA: Diagnosis not present

## 2019-08-06 DIAGNOSIS — I071 Rheumatic tricuspid insufficiency: Secondary | ICD-10-CM | POA: Diagnosis not present

## 2019-08-06 DIAGNOSIS — I2510 Atherosclerotic heart disease of native coronary artery without angina pectoris: Secondary | ICD-10-CM | POA: Diagnosis not present

## 2019-08-06 DIAGNOSIS — I5043 Acute on chronic combined systolic (congestive) and diastolic (congestive) heart failure: Secondary | ICD-10-CM | POA: Diagnosis not present

## 2019-08-06 DIAGNOSIS — E8770 Fluid overload, unspecified: Secondary | ICD-10-CM | POA: Diagnosis not present

## 2019-08-06 DIAGNOSIS — G3184 Mild cognitive impairment, so stated: Secondary | ICD-10-CM | POA: Diagnosis not present

## 2019-08-06 DIAGNOSIS — T8209XD Other mechanical complication of heart valve prosthesis, subsequent encounter: Secondary | ICD-10-CM | POA: Diagnosis not present

## 2019-08-06 DIAGNOSIS — M1990 Unspecified osteoarthritis, unspecified site: Secondary | ICD-10-CM | POA: Diagnosis not present

## 2019-08-06 DIAGNOSIS — I25810 Atherosclerosis of coronary artery bypass graft(s) without angina pectoris: Secondary | ICD-10-CM | POA: Diagnosis not present

## 2019-08-06 DIAGNOSIS — I272 Pulmonary hypertension, unspecified: Secondary | ICD-10-CM | POA: Diagnosis not present

## 2019-08-06 DIAGNOSIS — F418 Other specified anxiety disorders: Secondary | ICD-10-CM | POA: Diagnosis not present

## 2019-08-06 DIAGNOSIS — I4819 Other persistent atrial fibrillation: Secondary | ICD-10-CM | POA: Diagnosis not present

## 2019-08-09 DIAGNOSIS — M1990 Unspecified osteoarthritis, unspecified site: Secondary | ICD-10-CM | POA: Diagnosis not present

## 2019-08-09 DIAGNOSIS — I071 Rheumatic tricuspid insufficiency: Secondary | ICD-10-CM | POA: Diagnosis not present

## 2019-08-09 DIAGNOSIS — I2699 Other pulmonary embolism without acute cor pulmonale: Secondary | ICD-10-CM | POA: Diagnosis not present

## 2019-08-09 DIAGNOSIS — T8209XD Other mechanical complication of heart valve prosthesis, subsequent encounter: Secondary | ICD-10-CM | POA: Diagnosis not present

## 2019-08-09 DIAGNOSIS — I4819 Other persistent atrial fibrillation: Secondary | ICD-10-CM | POA: Diagnosis not present

## 2019-08-09 DIAGNOSIS — Z7901 Long term (current) use of anticoagulants: Secondary | ICD-10-CM | POA: Diagnosis not present

## 2019-08-09 DIAGNOSIS — I25810 Atherosclerosis of coronary artery bypass graft(s) without angina pectoris: Secondary | ICD-10-CM | POA: Diagnosis not present

## 2019-08-09 DIAGNOSIS — I2510 Atherosclerotic heart disease of native coronary artery without angina pectoris: Secondary | ICD-10-CM | POA: Diagnosis not present

## 2019-08-09 DIAGNOSIS — Z23 Encounter for immunization: Secondary | ICD-10-CM | POA: Diagnosis not present

## 2019-08-09 DIAGNOSIS — I272 Pulmonary hypertension, unspecified: Secondary | ICD-10-CM | POA: Diagnosis not present

## 2019-08-09 DIAGNOSIS — E8770 Fluid overload, unspecified: Secondary | ICD-10-CM | POA: Diagnosis not present

## 2019-08-09 DIAGNOSIS — F418 Other specified anxiety disorders: Secondary | ICD-10-CM | POA: Diagnosis not present

## 2019-08-09 DIAGNOSIS — G3184 Mild cognitive impairment, so stated: Secondary | ICD-10-CM | POA: Diagnosis not present

## 2019-08-09 DIAGNOSIS — I5043 Acute on chronic combined systolic (congestive) and diastolic (congestive) heart failure: Secondary | ICD-10-CM | POA: Diagnosis not present

## 2019-08-10 DIAGNOSIS — L97411 Non-pressure chronic ulcer of right heel and midfoot limited to breakdown of skin: Secondary | ICD-10-CM | POA: Diagnosis not present

## 2019-08-10 DIAGNOSIS — I739 Peripheral vascular disease, unspecified: Secondary | ICD-10-CM | POA: Diagnosis not present

## 2019-08-11 DIAGNOSIS — I071 Rheumatic tricuspid insufficiency: Secondary | ICD-10-CM | POA: Diagnosis not present

## 2019-08-11 DIAGNOSIS — G3184 Mild cognitive impairment, so stated: Secondary | ICD-10-CM | POA: Diagnosis not present

## 2019-08-11 DIAGNOSIS — I272 Pulmonary hypertension, unspecified: Secondary | ICD-10-CM | POA: Diagnosis not present

## 2019-08-11 DIAGNOSIS — E8770 Fluid overload, unspecified: Secondary | ICD-10-CM | POA: Diagnosis not present

## 2019-08-11 DIAGNOSIS — I25810 Atherosclerosis of coronary artery bypass graft(s) without angina pectoris: Secondary | ICD-10-CM | POA: Diagnosis not present

## 2019-08-11 DIAGNOSIS — I2699 Other pulmonary embolism without acute cor pulmonale: Secondary | ICD-10-CM | POA: Diagnosis not present

## 2019-08-11 DIAGNOSIS — I4819 Other persistent atrial fibrillation: Secondary | ICD-10-CM | POA: Diagnosis not present

## 2019-08-11 DIAGNOSIS — I5043 Acute on chronic combined systolic (congestive) and diastolic (congestive) heart failure: Secondary | ICD-10-CM | POA: Diagnosis not present

## 2019-08-11 DIAGNOSIS — Z7901 Long term (current) use of anticoagulants: Secondary | ICD-10-CM | POA: Diagnosis not present

## 2019-08-11 DIAGNOSIS — F418 Other specified anxiety disorders: Secondary | ICD-10-CM | POA: Diagnosis not present

## 2019-08-11 DIAGNOSIS — M1990 Unspecified osteoarthritis, unspecified site: Secondary | ICD-10-CM | POA: Diagnosis not present

## 2019-08-11 DIAGNOSIS — I2510 Atherosclerotic heart disease of native coronary artery without angina pectoris: Secondary | ICD-10-CM | POA: Diagnosis not present

## 2019-08-11 DIAGNOSIS — T8209XD Other mechanical complication of heart valve prosthesis, subsequent encounter: Secondary | ICD-10-CM | POA: Diagnosis not present

## 2019-08-12 DIAGNOSIS — I25810 Atherosclerosis of coronary artery bypass graft(s) without angina pectoris: Secondary | ICD-10-CM | POA: Diagnosis not present

## 2019-08-12 DIAGNOSIS — E8770 Fluid overload, unspecified: Secondary | ICD-10-CM | POA: Diagnosis not present

## 2019-08-12 DIAGNOSIS — I2699 Other pulmonary embolism without acute cor pulmonale: Secondary | ICD-10-CM | POA: Diagnosis not present

## 2019-08-12 DIAGNOSIS — I071 Rheumatic tricuspid insufficiency: Secondary | ICD-10-CM | POA: Diagnosis not present

## 2019-08-12 DIAGNOSIS — Z7901 Long term (current) use of anticoagulants: Secondary | ICD-10-CM | POA: Diagnosis not present

## 2019-08-12 DIAGNOSIS — F418 Other specified anxiety disorders: Secondary | ICD-10-CM | POA: Diagnosis not present

## 2019-08-12 DIAGNOSIS — I2510 Atherosclerotic heart disease of native coronary artery without angina pectoris: Secondary | ICD-10-CM | POA: Diagnosis not present

## 2019-08-12 DIAGNOSIS — G3184 Mild cognitive impairment, so stated: Secondary | ICD-10-CM | POA: Diagnosis not present

## 2019-08-12 DIAGNOSIS — I4819 Other persistent atrial fibrillation: Secondary | ICD-10-CM | POA: Diagnosis not present

## 2019-08-12 DIAGNOSIS — I272 Pulmonary hypertension, unspecified: Secondary | ICD-10-CM | POA: Diagnosis not present

## 2019-08-12 DIAGNOSIS — I5043 Acute on chronic combined systolic (congestive) and diastolic (congestive) heart failure: Secondary | ICD-10-CM | POA: Diagnosis not present

## 2019-08-12 DIAGNOSIS — T8209XD Other mechanical complication of heart valve prosthesis, subsequent encounter: Secondary | ICD-10-CM | POA: Diagnosis not present

## 2019-08-12 DIAGNOSIS — M1990 Unspecified osteoarthritis, unspecified site: Secondary | ICD-10-CM | POA: Diagnosis not present

## 2019-08-13 ENCOUNTER — Telehealth: Payer: Self-pay

## 2019-08-13 DIAGNOSIS — E8770 Fluid overload, unspecified: Secondary | ICD-10-CM | POA: Diagnosis not present

## 2019-08-13 DIAGNOSIS — Z7901 Long term (current) use of anticoagulants: Secondary | ICD-10-CM | POA: Diagnosis not present

## 2019-08-13 DIAGNOSIS — G3184 Mild cognitive impairment, so stated: Secondary | ICD-10-CM | POA: Diagnosis not present

## 2019-08-13 DIAGNOSIS — I4819 Other persistent atrial fibrillation: Secondary | ICD-10-CM | POA: Diagnosis not present

## 2019-08-13 DIAGNOSIS — T8209XD Other mechanical complication of heart valve prosthesis, subsequent encounter: Secondary | ICD-10-CM | POA: Diagnosis not present

## 2019-08-13 DIAGNOSIS — I2699 Other pulmonary embolism without acute cor pulmonale: Secondary | ICD-10-CM | POA: Diagnosis not present

## 2019-08-13 DIAGNOSIS — I2510 Atherosclerotic heart disease of native coronary artery without angina pectoris: Secondary | ICD-10-CM | POA: Diagnosis not present

## 2019-08-13 DIAGNOSIS — M1990 Unspecified osteoarthritis, unspecified site: Secondary | ICD-10-CM | POA: Diagnosis not present

## 2019-08-13 DIAGNOSIS — I272 Pulmonary hypertension, unspecified: Secondary | ICD-10-CM | POA: Diagnosis not present

## 2019-08-13 DIAGNOSIS — I25810 Atherosclerosis of coronary artery bypass graft(s) without angina pectoris: Secondary | ICD-10-CM | POA: Diagnosis not present

## 2019-08-13 DIAGNOSIS — I5043 Acute on chronic combined systolic (congestive) and diastolic (congestive) heart failure: Secondary | ICD-10-CM | POA: Diagnosis not present

## 2019-08-13 DIAGNOSIS — I071 Rheumatic tricuspid insufficiency: Secondary | ICD-10-CM | POA: Diagnosis not present

## 2019-08-13 DIAGNOSIS — F418 Other specified anxiety disorders: Secondary | ICD-10-CM | POA: Diagnosis not present

## 2019-08-13 NOTE — Telephone Encounter (Signed)
Tracey from [facility] calling in to get verbal orders for this patient. Requesting a call back    Please call and advise

## 2019-08-13 NOTE — Telephone Encounter (Signed)
New message    [facility] calling   Plan of care forms was faxed over on 12/4, 1/11, 3/10,3/3,2/24 & 2/19.   Checking on the status of those forms

## 2019-08-16 ENCOUNTER — Encounter: Payer: Self-pay | Admitting: Internal Medicine

## 2019-08-16 ENCOUNTER — Ambulatory Visit: Payer: Medicaid Other | Admitting: Internal Medicine

## 2019-08-16 ENCOUNTER — Other Ambulatory Visit: Payer: Self-pay

## 2019-08-16 VITALS — BP 130/82 | HR 58 | Temp 98.0°F | Ht <= 58 in | Wt 153.0 lb

## 2019-08-16 DIAGNOSIS — R413 Other amnesia: Secondary | ICD-10-CM

## 2019-08-16 DIAGNOSIS — F4323 Adjustment disorder with mixed anxiety and depressed mood: Secondary | ICD-10-CM | POA: Diagnosis not present

## 2019-08-16 DIAGNOSIS — L89613 Pressure ulcer of right heel, stage 3: Secondary | ICD-10-CM | POA: Diagnosis not present

## 2019-08-16 DIAGNOSIS — I272 Pulmonary hypertension, unspecified: Secondary | ICD-10-CM | POA: Diagnosis not present

## 2019-08-16 DIAGNOSIS — I130 Hypertensive heart and chronic kidney disease with heart failure and stage 1 through stage 4 chronic kidney disease, or unspecified chronic kidney disease: Secondary | ICD-10-CM | POA: Diagnosis not present

## 2019-08-16 DIAGNOSIS — I25810 Atherosclerosis of coronary artery bypass graft(s) without angina pectoris: Secondary | ICD-10-CM | POA: Diagnosis not present

## 2019-08-16 DIAGNOSIS — I0981 Rheumatic heart failure: Secondary | ICD-10-CM | POA: Diagnosis not present

## 2019-08-16 DIAGNOSIS — I739 Peripheral vascular disease, unspecified: Secondary | ICD-10-CM | POA: Diagnosis not present

## 2019-08-16 DIAGNOSIS — I10 Essential (primary) hypertension: Secondary | ICD-10-CM | POA: Diagnosis not present

## 2019-08-16 DIAGNOSIS — I4819 Other persistent atrial fibrillation: Secondary | ICD-10-CM | POA: Diagnosis not present

## 2019-08-16 DIAGNOSIS — I2699 Other pulmonary embolism without acute cor pulmonale: Secondary | ICD-10-CM | POA: Diagnosis not present

## 2019-08-16 DIAGNOSIS — R29898 Other symptoms and signs involving the musculoskeletal system: Secondary | ICD-10-CM

## 2019-08-16 DIAGNOSIS — I509 Heart failure, unspecified: Secondary | ICD-10-CM

## 2019-08-16 DIAGNOSIS — R471 Dysarthria and anarthria: Secondary | ICD-10-CM | POA: Diagnosis not present

## 2019-08-16 DIAGNOSIS — L97412 Non-pressure chronic ulcer of right heel and midfoot with fat layer exposed: Secondary | ICD-10-CM | POA: Diagnosis not present

## 2019-08-16 DIAGNOSIS — G3184 Mild cognitive impairment, so stated: Secondary | ICD-10-CM | POA: Diagnosis not present

## 2019-08-16 DIAGNOSIS — I5043 Acute on chronic combined systolic (congestive) and diastolic (congestive) heart failure: Secondary | ICD-10-CM | POA: Diagnosis not present

## 2019-08-16 DIAGNOSIS — L97409 Non-pressure chronic ulcer of unspecified heel and midfoot with unspecified severity: Secondary | ICD-10-CM | POA: Insufficient documentation

## 2019-08-16 DIAGNOSIS — R739 Hyperglycemia, unspecified: Secondary | ICD-10-CM | POA: Diagnosis not present

## 2019-08-16 DIAGNOSIS — N183 Chronic kidney disease, stage 3 unspecified: Secondary | ICD-10-CM | POA: Diagnosis not present

## 2019-08-16 DIAGNOSIS — T8209XD Other mechanical complication of heart valve prosthesis, subsequent encounter: Secondary | ICD-10-CM | POA: Diagnosis not present

## 2019-08-16 DIAGNOSIS — I071 Rheumatic tricuspid insufficiency: Secondary | ICD-10-CM | POA: Diagnosis not present

## 2019-08-16 LAB — HEPATIC FUNCTION PANEL
ALT: 21 U/L (ref 0–35)
AST: 33 U/L (ref 0–37)
Albumin: 3.4 g/dL — ABNORMAL LOW (ref 3.5–5.2)
Alkaline Phosphatase: 123 U/L — ABNORMAL HIGH (ref 39–117)
Bilirubin, Direct: 0.1 mg/dL (ref 0.0–0.3)
Total Bilirubin: 0.4 mg/dL (ref 0.2–1.2)
Total Protein: 7.9 g/dL (ref 6.0–8.3)

## 2019-08-16 LAB — BASIC METABOLIC PANEL
BUN: 28 mg/dL — ABNORMAL HIGH (ref 6–23)
CO2: 26 mEq/L (ref 19–32)
Calcium: 9.2 mg/dL (ref 8.4–10.5)
Chloride: 102 mEq/L (ref 96–112)
Creatinine, Ser: 1.15 mg/dL (ref 0.40–1.20)
GFR: 45.03 mL/min — ABNORMAL LOW (ref >60.00)
Glucose, Bld: 106 mg/dL — ABNORMAL HIGH (ref 70–99)
Potassium: 4 mEq/L (ref 3.5–5.1)
Sodium: 137 mEq/L (ref 135–145)

## 2019-08-16 LAB — TSH: TSH: 3.63 u[IU]/mL (ref 0.35–4.50)

## 2019-08-16 LAB — MAGNESIUM: Magnesium: 2 mg/dL (ref 1.5–2.5)

## 2019-08-16 LAB — HEMOGLOBIN A1C: Hgb A1c MFr Bld: 6 % (ref 4.6–6.5)

## 2019-08-16 NOTE — Assessment & Plan Note (Signed)
In PT 

## 2019-08-16 NOTE — Addendum Note (Signed)
Addended by: Cresenciano Lick on: 08/16/2019 10:58 AM   Modules accepted: Orders

## 2019-08-16 NOTE — Assessment & Plan Note (Signed)
Dysarthria

## 2019-08-16 NOTE — Assessment & Plan Note (Addendum)
Lexapro, Zyprexa, Remeron - post hospital

## 2019-08-16 NOTE — Telephone Encounter (Signed)
LM notifying that I have faxed over all forms I have recieved

## 2019-08-16 NOTE — Progress Notes (Signed)
Subjective:  Patient ID: Alexis [name], female    DOB: 14-Aug-1933  Age: 86 y.o. MRN: [id number]  CC: No chief complaint on file.   HPI Alexis [name] presents for R heel ulcer  (not new)  - wound care clinic C/o new pain in the heel  C/o hearing loss  Outpatient Medications Prior to Visit  Medication Sig Dispense Refill  . acetaminophen (TYLENOL) 500 MG tablet Take 500 mg by mouth every 6 (six) hours as needed.    [name] atorvastatin (LIPITOR) 10 MG tablet Take 1 tablet (10 mg total) by mouth daily. 90 tablet 3  . b complex vitamins tablet Take 1 tablet by mouth daily. 100 tablet 3  . bisoprolol-hydrochlorothiazide (ZIAC) 2.5-6.25 MG tablet Take 1/2 tablet by mouth daily 45 tablet 3  . Cholecalciferol (VITAMIN D3) 2000 units capsule Take 1 capsule (2,000 Units total) by mouth daily. 100 capsule 3  . donepezil (ARICEPT) 10 MG tablet Take 1 tablet (10 mg total) by mouth at bedtime. 90 tablet 3  . escitalopram (LEXAPRO) 5 MG tablet Take 1 tablet (5 mg total) by mouth daily. 30 tablet 5  . furosemide (LASIX) 20 MG tablet Take 1 tablet (20 mg total) by mouth daily. 90 tablet 3  . loratadine (CLARITIN) 10 MG tablet Take 10 mg by mouth daily as needed for allergies.    . potassium chloride SA (KLOR-CON) 20 MEQ tablet Take 1 tablet (20 mEq total) by mouth daily. 90 tablet 1  . warfarin (COUMADIN) 2.5 MG tablet TAKE 1/2 TABLET BY MOUTH IN THE EVENING ON SUNDAY, TUESDAY, THURSDAY AND SATURDAY, TAKE 1 TABLET ON MONDAY, WEDNESDAY AND FRIDAY 90 tablet 2   No facility-administered medications prior to visit.    ROS: Review of Systems  Constitutional: Positive for unexpected weight change. Negative for activity change, appetite change, chills and fatigue.  HENT: Negative for congestion, mouth sores and sinus pressure.   Eyes: Negative for visual disturbance.  Respiratory: Negative for cough and chest tightness.   Gastrointestinal: Negative for abdominal pain and nausea.  Genitourinary:  Negative for difficulty urinating, frequency and vaginal pain.  Musculoskeletal: Negative for back pain and gait problem.  Skin: Negative for pallor and rash.  Neurological: Negative for dizziness, tremors, weakness, numbness and headaches.  Psychiatric/Behavioral: Positive for decreased concentration. Negative for confusion, hallucinations, self-injury, sleep disturbance and suicidal ideas. The patient is nervous/anxious.     Objective:  There were no vitals taken for this visit.  BP Readings from Last 3 Encounters:  03/16/19 (!) 144/82  02/03/19 116/78  01/20/19 117/79    Wt Readings from Last 3 Encounters:  03/16/19 145 lb (65.8 kg)  02/03/19 138 lb (62.6 kg)  01/20/19 141 lb 8 oz (64.2 kg)    Physical Exam Constitutional:      General: She is not in acute distress.    Appearance: She is well-developed.  HENT:     Head: Normocephalic.     Right Ear: External ear normal.     Left Ear: External ear normal.     Nose: Nose normal.  Eyes:     General:        Right eye: No discharge.        Left eye: No discharge.     Conjunctiva/sclera: Conjunctivae normal.     Pupils: Pupils are equal, round, and reactive to light.  Neck:     Thyroid: No thyromegaly.     Vascular: No JVD.     Trachea: No tracheal deviation.  Cardiovascular:  Rate and Rhythm: Normal rate and regular rhythm.     Heart sounds: Normal heart sounds.  Pulmonary:     Effort: No respiratory distress.     Breath sounds: No stridor. No wheezing.  Abdominal:     General: Bowel sounds are normal. There is no distension.     Palpations: Abdomen is soft. There is no mass.     Tenderness: There is no abdominal tenderness. There is no guarding or rebound.  Musculoskeletal:        General: No tenderness.     Cervical back: Normal range of motion and neck supple.  Lymphadenopathy:     Cervical: No cervical adenopathy.  Skin:    Findings: No erythema or rash.  Neurological:     Cranial Nerves: No cranial  nerve deficit.     Motor: No abnormal muscle tone.     Coordination: Coordination normal.     Deep Tendon Reflexes: Reflexes normal.  Psychiatric:        Behavior: Behavior normal.        Thought Content: Thought content normal.        Judgment: Judgment normal.    L foot in a boot L heel ulcer  dysartric  In a w/c  Lab Results  Component Value Date   WBC 6.1 05/04/2018   HGB 12.2 05/04/2018   HCT 36.2 05/04/2018   PLT 244.0 05/04/2018   GLUCOSE 101 (H) 03/16/2019   CHOL 169 05/04/2018   TRIG 158.0 (H) 05/04/2018   HDL 46.00 05/04/2018   LDLCALC 91 05/04/2018   ALT 15 05/04/2018   AST 23 05/04/2018   NA 136 03/16/2019   K 3.1 (L) 03/16/2019   CL 101 03/16/2019   CREATININE 1.12 03/16/2019   BUN 20 03/16/2019   CO2 26 03/16/2019   TSH 1.52 05/04/2018   INR 2.0 (H) 04/25/2017   HGBA1C 5.8 (H) 07/13/2016    CT HEAD WO CONTRAST  Result Date: 01/29/2019 CLINICAL DATA:  Vertigo. Memory loss. History of bilateral thalamic stroke. EXAM: CT HEAD WITHOUT CONTRAST TECHNIQUE: Contiguous axial images were obtained from the base of the skull through the vertex without intravenous contrast. COMPARISON:  MRI 07/14/2016 FINDINGS: Brain: No evidence of acute infarction, hemorrhage, hydrocephalus, extra-axial collection or mass lesion/mass effect. Prominence of the sulci identified compatible with brain atrophy. Vascular: No hyperdense vessel or unexpected calcification. Skull: Normal. Negative for fracture or focal lesion. Sinuses/Orbits: No acute finding. Other: None IMPRESSION: 1. No acute intracranial abnormality. 2. Mild brain atrophy. Electronically Signed   By: Kerby Moors M.D.   On: 01/29/2019 09:00    Assessment & Plan:     Follow-up: No follow-ups on file.  [name], MD

## 2019-08-16 NOTE — Telephone Encounter (Signed)
Verbals given  

## 2019-08-16 NOTE — Assessment & Plan Note (Signed)
Aricept.

## 2019-08-16 NOTE — Assessment & Plan Note (Signed)
Cont current Rx

## 2019-08-16 NOTE — Assessment & Plan Note (Signed)
BP Readings from Last 3 Encounters:  08/16/19 130/82  03/16/19 (!) 144/82  02/03/19 116/78

## 2019-08-16 NOTE — Assessment & Plan Note (Signed)
R 3/21 [facility]

## 2019-08-18 DIAGNOSIS — I482 Chronic atrial fibrillation, unspecified: Secondary | ICD-10-CM | POA: Diagnosis not present

## 2019-08-18 DIAGNOSIS — N183 Chronic kidney disease, stage 3 unspecified: Secondary | ICD-10-CM | POA: Diagnosis not present

## 2019-08-18 DIAGNOSIS — I739 Peripheral vascular disease, unspecified: Secondary | ICD-10-CM | POA: Diagnosis not present

## 2019-08-18 DIAGNOSIS — Z7901 Long term (current) use of anticoagulants: Secondary | ICD-10-CM | POA: Diagnosis not present

## 2019-08-18 DIAGNOSIS — L89613 Pressure ulcer of right heel, stage 3: Secondary | ICD-10-CM | POA: Diagnosis not present

## 2019-08-20 ENCOUNTER — Other Ambulatory Visit: Payer: Self-pay | Admitting: Internal Medicine

## 2019-08-20 DIAGNOSIS — I4819 Other persistent atrial fibrillation: Secondary | ICD-10-CM | POA: Diagnosis not present

## 2019-08-20 DIAGNOSIS — I071 Rheumatic tricuspid insufficiency: Secondary | ICD-10-CM | POA: Diagnosis not present

## 2019-08-20 DIAGNOSIS — G3184 Mild cognitive impairment, so stated: Secondary | ICD-10-CM | POA: Diagnosis not present

## 2019-08-20 DIAGNOSIS — I739 Peripheral vascular disease, unspecified: Secondary | ICD-10-CM | POA: Diagnosis not present

## 2019-08-20 DIAGNOSIS — I0981 Rheumatic heart failure: Secondary | ICD-10-CM | POA: Diagnosis not present

## 2019-08-20 DIAGNOSIS — I2699 Other pulmonary embolism without acute cor pulmonale: Secondary | ICD-10-CM | POA: Diagnosis not present

## 2019-08-20 DIAGNOSIS — T8209XD Other mechanical complication of heart valve prosthesis, subsequent encounter: Secondary | ICD-10-CM | POA: Diagnosis not present

## 2019-08-20 DIAGNOSIS — I272 Pulmonary hypertension, unspecified: Secondary | ICD-10-CM | POA: Diagnosis not present

## 2019-08-20 DIAGNOSIS — L89613 Pressure ulcer of right heel, stage 3: Secondary | ICD-10-CM | POA: Diagnosis not present

## 2019-08-20 DIAGNOSIS — I5043 Acute on chronic combined systolic (congestive) and diastolic (congestive) heart failure: Secondary | ICD-10-CM | POA: Diagnosis not present

## 2019-08-20 DIAGNOSIS — I25810 Atherosclerosis of coronary artery bypass graft(s) without angina pectoris: Secondary | ICD-10-CM | POA: Diagnosis not present

## 2019-08-20 DIAGNOSIS — N183 Chronic kidney disease, stage 3 unspecified: Secondary | ICD-10-CM | POA: Diagnosis not present

## 2019-08-20 DIAGNOSIS — I130 Hypertensive heart and chronic kidney disease with heart failure and stage 1 through stage 4 chronic kidney disease, or unspecified chronic kidney disease: Secondary | ICD-10-CM | POA: Diagnosis not present

## 2019-08-24 NOTE — Telephone Encounter (Signed)
F/u  [facility] aware of the previous message stated some plan of care is missing will be refaxing forms over to [phone].

## 2019-08-25 DIAGNOSIS — I272 Pulmonary hypertension, unspecified: Secondary | ICD-10-CM | POA: Diagnosis not present

## 2019-08-25 DIAGNOSIS — I5043 Acute on chronic combined systolic (congestive) and diastolic (congestive) heart failure: Secondary | ICD-10-CM | POA: Diagnosis not present

## 2019-08-25 DIAGNOSIS — I071 Rheumatic tricuspid insufficiency: Secondary | ICD-10-CM | POA: Diagnosis not present

## 2019-08-25 DIAGNOSIS — I2699 Other pulmonary embolism without acute cor pulmonale: Secondary | ICD-10-CM | POA: Diagnosis not present

## 2019-08-25 DIAGNOSIS — N183 Chronic kidney disease, stage 3 unspecified: Secondary | ICD-10-CM | POA: Diagnosis not present

## 2019-08-25 DIAGNOSIS — I4819 Other persistent atrial fibrillation: Secondary | ICD-10-CM | POA: Diagnosis not present

## 2019-08-25 DIAGNOSIS — L89613 Pressure ulcer of right heel, stage 3: Secondary | ICD-10-CM | POA: Diagnosis not present

## 2019-08-25 DIAGNOSIS — G3184 Mild cognitive impairment, so stated: Secondary | ICD-10-CM | POA: Diagnosis not present

## 2019-08-25 DIAGNOSIS — I130 Hypertensive heart and chronic kidney disease with heart failure and stage 1 through stage 4 chronic kidney disease, or unspecified chronic kidney disease: Secondary | ICD-10-CM | POA: Diagnosis not present

## 2019-08-25 DIAGNOSIS — I0981 Rheumatic heart failure: Secondary | ICD-10-CM | POA: Diagnosis not present

## 2019-08-25 DIAGNOSIS — I739 Peripheral vascular disease, unspecified: Secondary | ICD-10-CM | POA: Diagnosis not present

## 2019-08-25 DIAGNOSIS — T8209XD Other mechanical complication of heart valve prosthesis, subsequent encounter: Secondary | ICD-10-CM | POA: Diagnosis not present

## 2019-08-25 DIAGNOSIS — I25810 Atherosclerosis of coronary artery bypass graft(s) without angina pectoris: Secondary | ICD-10-CM | POA: Diagnosis not present

## 2019-08-27 DIAGNOSIS — N183 Chronic kidney disease, stage 3 unspecified: Secondary | ICD-10-CM | POA: Diagnosis not present

## 2019-08-27 DIAGNOSIS — I130 Hypertensive heart and chronic kidney disease with heart failure and stage 1 through stage 4 chronic kidney disease, or unspecified chronic kidney disease: Secondary | ICD-10-CM | POA: Diagnosis not present

## 2019-08-27 DIAGNOSIS — G3184 Mild cognitive impairment, so stated: Secondary | ICD-10-CM | POA: Diagnosis not present

## 2019-08-27 DIAGNOSIS — I2699 Other pulmonary embolism without acute cor pulmonale: Secondary | ICD-10-CM | POA: Diagnosis not present

## 2019-08-27 DIAGNOSIS — I071 Rheumatic tricuspid insufficiency: Secondary | ICD-10-CM | POA: Diagnosis not present

## 2019-08-27 DIAGNOSIS — L89613 Pressure ulcer of right heel, stage 3: Secondary | ICD-10-CM | POA: Diagnosis not present

## 2019-08-27 DIAGNOSIS — I5043 Acute on chronic combined systolic (congestive) and diastolic (congestive) heart failure: Secondary | ICD-10-CM | POA: Diagnosis not present

## 2019-08-27 DIAGNOSIS — T8209XD Other mechanical complication of heart valve prosthesis, subsequent encounter: Secondary | ICD-10-CM | POA: Diagnosis not present

## 2019-08-27 DIAGNOSIS — I739 Peripheral vascular disease, unspecified: Secondary | ICD-10-CM | POA: Diagnosis not present

## 2019-08-27 DIAGNOSIS — I272 Pulmonary hypertension, unspecified: Secondary | ICD-10-CM | POA: Diagnosis not present

## 2019-08-27 DIAGNOSIS — I25810 Atherosclerosis of coronary artery bypass graft(s) without angina pectoris: Secondary | ICD-10-CM | POA: Diagnosis not present

## 2019-08-27 DIAGNOSIS — I4819 Other persistent atrial fibrillation: Secondary | ICD-10-CM | POA: Diagnosis not present

## 2019-08-27 DIAGNOSIS — I0981 Rheumatic heart failure: Secondary | ICD-10-CM | POA: Diagnosis not present

## 2019-08-30 DIAGNOSIS — N183 Chronic kidney disease, stage 3 unspecified: Secondary | ICD-10-CM | POA: Diagnosis not present

## 2019-08-30 DIAGNOSIS — I4819 Other persistent atrial fibrillation: Secondary | ICD-10-CM | POA: Diagnosis not present

## 2019-08-30 DIAGNOSIS — I071 Rheumatic tricuspid insufficiency: Secondary | ICD-10-CM | POA: Diagnosis not present

## 2019-08-30 DIAGNOSIS — I739 Peripheral vascular disease, unspecified: Secondary | ICD-10-CM | POA: Diagnosis not present

## 2019-08-30 DIAGNOSIS — I272 Pulmonary hypertension, unspecified: Secondary | ICD-10-CM | POA: Diagnosis not present

## 2019-08-30 DIAGNOSIS — I130 Hypertensive heart and chronic kidney disease with heart failure and stage 1 through stage 4 chronic kidney disease, or unspecified chronic kidney disease: Secondary | ICD-10-CM | POA: Diagnosis not present

## 2019-08-30 DIAGNOSIS — I25810 Atherosclerosis of coronary artery bypass graft(s) without angina pectoris: Secondary | ICD-10-CM | POA: Diagnosis not present

## 2019-08-30 DIAGNOSIS — T8209XD Other mechanical complication of heart valve prosthesis, subsequent encounter: Secondary | ICD-10-CM | POA: Diagnosis not present

## 2019-08-30 DIAGNOSIS — I0981 Rheumatic heart failure: Secondary | ICD-10-CM | POA: Diagnosis not present

## 2019-08-30 DIAGNOSIS — I2699 Other pulmonary embolism without acute cor pulmonale: Secondary | ICD-10-CM | POA: Diagnosis not present

## 2019-08-30 DIAGNOSIS — L89613 Pressure ulcer of right heel, stage 3: Secondary | ICD-10-CM | POA: Diagnosis not present

## 2019-08-30 DIAGNOSIS — G3184 Mild cognitive impairment, so stated: Secondary | ICD-10-CM | POA: Diagnosis not present

## 2019-08-30 DIAGNOSIS — I5043 Acute on chronic combined systolic (congestive) and diastolic (congestive) heart failure: Secondary | ICD-10-CM | POA: Diagnosis not present

## 2019-08-31 DIAGNOSIS — G3184 Mild cognitive impairment, so stated: Secondary | ICD-10-CM | POA: Diagnosis not present

## 2019-08-31 DIAGNOSIS — I130 Hypertensive heart and chronic kidney disease with heart failure and stage 1 through stage 4 chronic kidney disease, or unspecified chronic kidney disease: Secondary | ICD-10-CM | POA: Diagnosis not present

## 2019-08-31 DIAGNOSIS — I0981 Rheumatic heart failure: Secondary | ICD-10-CM | POA: Diagnosis not present

## 2019-08-31 DIAGNOSIS — I739 Peripheral vascular disease, unspecified: Secondary | ICD-10-CM | POA: Diagnosis not present

## 2019-08-31 DIAGNOSIS — L89613 Pressure ulcer of right heel, stage 3: Secondary | ICD-10-CM | POA: Diagnosis not present

## 2019-08-31 DIAGNOSIS — I071 Rheumatic tricuspid insufficiency: Secondary | ICD-10-CM | POA: Diagnosis not present

## 2019-08-31 DIAGNOSIS — I4819 Other persistent atrial fibrillation: Secondary | ICD-10-CM | POA: Diagnosis not present

## 2019-08-31 DIAGNOSIS — T8209XD Other mechanical complication of heart valve prosthesis, subsequent encounter: Secondary | ICD-10-CM | POA: Diagnosis not present

## 2019-08-31 DIAGNOSIS — I272 Pulmonary hypertension, unspecified: Secondary | ICD-10-CM | POA: Diagnosis not present

## 2019-08-31 DIAGNOSIS — N183 Chronic kidney disease, stage 3 unspecified: Secondary | ICD-10-CM | POA: Diagnosis not present

## 2019-08-31 DIAGNOSIS — I2699 Other pulmonary embolism without acute cor pulmonale: Secondary | ICD-10-CM | POA: Diagnosis not present

## 2019-08-31 DIAGNOSIS — I5043 Acute on chronic combined systolic (congestive) and diastolic (congestive) heart failure: Secondary | ICD-10-CM | POA: Diagnosis not present

## 2019-08-31 DIAGNOSIS — I25810 Atherosclerosis of coronary artery bypass graft(s) without angina pectoris: Secondary | ICD-10-CM | POA: Diagnosis not present

## 2019-09-01 ENCOUNTER — Telehealth: Payer: Self-pay | Admitting: Internal Medicine

## 2019-09-01 DIAGNOSIS — I5043 Acute on chronic combined systolic (congestive) and diastolic (congestive) heart failure: Secondary | ICD-10-CM | POA: Diagnosis not present

## 2019-09-01 DIAGNOSIS — I272 Pulmonary hypertension, unspecified: Secondary | ICD-10-CM | POA: Diagnosis not present

## 2019-09-01 DIAGNOSIS — I0981 Rheumatic heart failure: Secondary | ICD-10-CM | POA: Diagnosis not present

## 2019-09-01 DIAGNOSIS — I071 Rheumatic tricuspid insufficiency: Secondary | ICD-10-CM | POA: Diagnosis not present

## 2019-09-01 DIAGNOSIS — I25810 Atherosclerosis of coronary artery bypass graft(s) without angina pectoris: Secondary | ICD-10-CM | POA: Diagnosis not present

## 2019-09-01 DIAGNOSIS — N183 Chronic kidney disease, stage 3 unspecified: Secondary | ICD-10-CM | POA: Diagnosis not present

## 2019-09-01 DIAGNOSIS — I739 Peripheral vascular disease, unspecified: Secondary | ICD-10-CM | POA: Diagnosis not present

## 2019-09-01 DIAGNOSIS — L89613 Pressure ulcer of right heel, stage 3: Secondary | ICD-10-CM | POA: Diagnosis not present

## 2019-09-01 DIAGNOSIS — G3184 Mild cognitive impairment, so stated: Secondary | ICD-10-CM | POA: Diagnosis not present

## 2019-09-01 DIAGNOSIS — I2699 Other pulmonary embolism without acute cor pulmonale: Secondary | ICD-10-CM | POA: Diagnosis not present

## 2019-09-01 DIAGNOSIS — I130 Hypertensive heart and chronic kidney disease with heart failure and stage 1 through stage 4 chronic kidney disease, or unspecified chronic kidney disease: Secondary | ICD-10-CM | POA: Diagnosis not present

## 2019-09-01 DIAGNOSIS — T8209XD Other mechanical complication of heart valve prosthesis, subsequent encounter: Secondary | ICD-10-CM | POA: Diagnosis not present

## 2019-09-01 DIAGNOSIS — I4819 Other persistent atrial fibrillation: Secondary | ICD-10-CM | POA: Diagnosis not present

## 2019-09-01 NOTE — Telephone Encounter (Signed)
New Message:   1.Medication Requested: Metoprolol 50 mg 2. Pharmacy (Name, Street, [place]): [place], [place] 3. On Med List:   4. Last Visit with PCP: 08/16/19  5. Next visit date with PCP:   Pt's daughter states she was prescribed this the last time the patient was in the hospital and was told to call her pcp to get this refilled.  Agent: Please be advised that RX refills may take up to 3 business days. We ask that you follow-up with your pharmacy.

## 2019-09-02 DIAGNOSIS — Z7982 Long term (current) use of aspirin: Secondary | ICD-10-CM

## 2019-09-02 DIAGNOSIS — L89613 Pressure ulcer of right heel, stage 3: Secondary | ICD-10-CM | POA: Diagnosis not present

## 2019-09-02 DIAGNOSIS — I071 Rheumatic tricuspid insufficiency: Secondary | ICD-10-CM | POA: Diagnosis not present

## 2019-09-02 DIAGNOSIS — Z8673 Personal history of transient ischemic attack (TIA), and cerebral infarction without residual deficits: Secondary | ICD-10-CM

## 2019-09-02 DIAGNOSIS — Z5181 Encounter for therapeutic drug level monitoring: Secondary | ICD-10-CM

## 2019-09-02 DIAGNOSIS — G3184 Mild cognitive impairment, so stated: Secondary | ICD-10-CM | POA: Diagnosis not present

## 2019-09-02 DIAGNOSIS — N183 Chronic kidney disease, stage 3 unspecified: Secondary | ICD-10-CM | POA: Diagnosis not present

## 2019-09-02 DIAGNOSIS — I130 Hypertensive heart and chronic kidney disease with heart failure and stage 1 through stage 4 chronic kidney disease, or unspecified chronic kidney disease: Secondary | ICD-10-CM | POA: Diagnosis not present

## 2019-09-02 DIAGNOSIS — I4819 Other persistent atrial fibrillation: Secondary | ICD-10-CM | POA: Diagnosis not present

## 2019-09-02 DIAGNOSIS — I739 Peripheral vascular disease, unspecified: Secondary | ICD-10-CM | POA: Diagnosis not present

## 2019-09-02 DIAGNOSIS — E8770 Fluid overload, unspecified: Secondary | ICD-10-CM

## 2019-09-02 DIAGNOSIS — F418 Other specified anxiety disorders: Secondary | ICD-10-CM

## 2019-09-02 DIAGNOSIS — Z7901 Long term (current) use of anticoagulants: Secondary | ICD-10-CM

## 2019-09-02 DIAGNOSIS — I5043 Acute on chronic combined systolic (congestive) and diastolic (congestive) heart failure: Secondary | ICD-10-CM | POA: Diagnosis not present

## 2019-09-02 DIAGNOSIS — I272 Pulmonary hypertension, unspecified: Secondary | ICD-10-CM | POA: Diagnosis not present

## 2019-09-02 DIAGNOSIS — M1990 Unspecified osteoarthritis, unspecified site: Secondary | ICD-10-CM

## 2019-09-02 DIAGNOSIS — I25810 Atherosclerosis of coronary artery bypass graft(s) without angina pectoris: Secondary | ICD-10-CM | POA: Diagnosis not present

## 2019-09-02 DIAGNOSIS — I0981 Rheumatic heart failure: Secondary | ICD-10-CM | POA: Diagnosis not present

## 2019-09-02 DIAGNOSIS — I2699 Other pulmonary embolism without acute cor pulmonale: Secondary | ICD-10-CM | POA: Diagnosis not present

## 2019-09-02 DIAGNOSIS — T8209XD Other mechanical complication of heart valve prosthesis, subsequent encounter: Secondary | ICD-10-CM | POA: Diagnosis not present

## 2019-09-02 MED ORDER — METOPROLOL SUCCINATE ER 50 MG PO TB24: 50.0000 mg | ORAL_TABLET | Freq: Every day | ORAL | 3 refills | Status: DC

## 2019-09-02 NOTE — Telephone Encounter (Signed)
Ok Thx 

## 2019-09-03 DIAGNOSIS — I071 Rheumatic tricuspid insufficiency: Secondary | ICD-10-CM | POA: Diagnosis not present

## 2019-09-03 DIAGNOSIS — I130 Hypertensive heart and chronic kidney disease with heart failure and stage 1 through stage 4 chronic kidney disease, or unspecified chronic kidney disease: Secondary | ICD-10-CM | POA: Diagnosis not present

## 2019-09-03 DIAGNOSIS — T8209XD Other mechanical complication of heart valve prosthesis, subsequent encounter: Secondary | ICD-10-CM | POA: Diagnosis not present

## 2019-09-03 DIAGNOSIS — I739 Peripheral vascular disease, unspecified: Secondary | ICD-10-CM | POA: Diagnosis not present

## 2019-09-03 DIAGNOSIS — L89613 Pressure ulcer of right heel, stage 3: Secondary | ICD-10-CM | POA: Diagnosis not present

## 2019-09-03 DIAGNOSIS — G3184 Mild cognitive impairment, so stated: Secondary | ICD-10-CM | POA: Diagnosis not present

## 2019-09-03 DIAGNOSIS — I2699 Other pulmonary embolism without acute cor pulmonale: Secondary | ICD-10-CM | POA: Diagnosis not present

## 2019-09-03 DIAGNOSIS — N183 Chronic kidney disease, stage 3 unspecified: Secondary | ICD-10-CM | POA: Diagnosis not present

## 2019-09-03 DIAGNOSIS — I0981 Rheumatic heart failure: Secondary | ICD-10-CM | POA: Diagnosis not present

## 2019-09-03 DIAGNOSIS — I272 Pulmonary hypertension, unspecified: Secondary | ICD-10-CM | POA: Diagnosis not present

## 2019-09-03 DIAGNOSIS — I4819 Other persistent atrial fibrillation: Secondary | ICD-10-CM | POA: Diagnosis not present

## 2019-09-03 DIAGNOSIS — I25810 Atherosclerosis of coronary artery bypass graft(s) without angina pectoris: Secondary | ICD-10-CM | POA: Diagnosis not present

## 2019-09-03 DIAGNOSIS — I5043 Acute on chronic combined systolic (congestive) and diastolic (congestive) heart failure: Secondary | ICD-10-CM | POA: Diagnosis not present

## 2019-09-06 DIAGNOSIS — N183 Chronic kidney disease, stage 3 unspecified: Secondary | ICD-10-CM | POA: Diagnosis not present

## 2019-09-06 DIAGNOSIS — I739 Peripheral vascular disease, unspecified: Secondary | ICD-10-CM | POA: Diagnosis not present

## 2019-09-06 DIAGNOSIS — L89613 Pressure ulcer of right heel, stage 3: Secondary | ICD-10-CM | POA: Diagnosis not present

## 2019-09-08 DIAGNOSIS — I2699 Other pulmonary embolism without acute cor pulmonale: Secondary | ICD-10-CM | POA: Diagnosis not present

## 2019-09-08 DIAGNOSIS — I739 Peripheral vascular disease, unspecified: Secondary | ICD-10-CM | POA: Diagnosis not present

## 2019-09-08 DIAGNOSIS — I25810 Atherosclerosis of coronary artery bypass graft(s) without angina pectoris: Secondary | ICD-10-CM | POA: Diagnosis not present

## 2019-09-08 DIAGNOSIS — I071 Rheumatic tricuspid insufficiency: Secondary | ICD-10-CM | POA: Diagnosis not present

## 2019-09-08 DIAGNOSIS — T8209XD Other mechanical complication of heart valve prosthesis, subsequent encounter: Secondary | ICD-10-CM | POA: Diagnosis not present

## 2019-09-08 DIAGNOSIS — I5043 Acute on chronic combined systolic (congestive) and diastolic (congestive) heart failure: Secondary | ICD-10-CM | POA: Diagnosis not present

## 2019-09-08 DIAGNOSIS — I0981 Rheumatic heart failure: Secondary | ICD-10-CM | POA: Diagnosis not present

## 2019-09-08 DIAGNOSIS — G3184 Mild cognitive impairment, so stated: Secondary | ICD-10-CM | POA: Diagnosis not present

## 2019-09-08 DIAGNOSIS — I272 Pulmonary hypertension, unspecified: Secondary | ICD-10-CM | POA: Diagnosis not present

## 2019-09-08 DIAGNOSIS — I130 Hypertensive heart and chronic kidney disease with heart failure and stage 1 through stage 4 chronic kidney disease, or unspecified chronic kidney disease: Secondary | ICD-10-CM | POA: Diagnosis not present

## 2019-09-08 DIAGNOSIS — L89613 Pressure ulcer of right heel, stage 3: Secondary | ICD-10-CM | POA: Diagnosis not present

## 2019-09-08 DIAGNOSIS — N183 Chronic kidney disease, stage 3 unspecified: Secondary | ICD-10-CM | POA: Diagnosis not present

## 2019-09-08 DIAGNOSIS — I4819 Other persistent atrial fibrillation: Secondary | ICD-10-CM | POA: Diagnosis not present

## 2019-09-10 DIAGNOSIS — I739 Peripheral vascular disease, unspecified: Secondary | ICD-10-CM | POA: Diagnosis not present

## 2019-09-10 DIAGNOSIS — I130 Hypertensive heart and chronic kidney disease with heart failure and stage 1 through stage 4 chronic kidney disease, or unspecified chronic kidney disease: Secondary | ICD-10-CM | POA: Diagnosis not present

## 2019-09-10 DIAGNOSIS — I071 Rheumatic tricuspid insufficiency: Secondary | ICD-10-CM | POA: Diagnosis not present

## 2019-09-10 DIAGNOSIS — I5043 Acute on chronic combined systolic (congestive) and diastolic (congestive) heart failure: Secondary | ICD-10-CM | POA: Diagnosis not present

## 2019-09-10 DIAGNOSIS — I272 Pulmonary hypertension, unspecified: Secondary | ICD-10-CM | POA: Diagnosis not present

## 2019-09-10 DIAGNOSIS — I2699 Other pulmonary embolism without acute cor pulmonale: Secondary | ICD-10-CM | POA: Diagnosis not present

## 2019-09-10 DIAGNOSIS — I4819 Other persistent atrial fibrillation: Secondary | ICD-10-CM | POA: Diagnosis not present

## 2019-09-10 DIAGNOSIS — T8209XD Other mechanical complication of heart valve prosthesis, subsequent encounter: Secondary | ICD-10-CM | POA: Diagnosis not present

## 2019-09-10 DIAGNOSIS — G3184 Mild cognitive impairment, so stated: Secondary | ICD-10-CM | POA: Diagnosis not present

## 2019-09-10 DIAGNOSIS — I0981 Rheumatic heart failure: Secondary | ICD-10-CM | POA: Diagnosis not present

## 2019-09-10 DIAGNOSIS — N183 Chronic kidney disease, stage 3 unspecified: Secondary | ICD-10-CM | POA: Diagnosis not present

## 2019-09-10 DIAGNOSIS — L89613 Pressure ulcer of right heel, stage 3: Secondary | ICD-10-CM | POA: Diagnosis not present

## 2019-09-10 DIAGNOSIS — I25810 Atherosclerosis of coronary artery bypass graft(s) without angina pectoris: Secondary | ICD-10-CM | POA: Diagnosis not present

## 2019-09-13 DIAGNOSIS — I130 Hypertensive heart and chronic kidney disease with heart failure and stage 1 through stage 4 chronic kidney disease, or unspecified chronic kidney disease: Secondary | ICD-10-CM | POA: Diagnosis not present

## 2019-09-13 DIAGNOSIS — I071 Rheumatic tricuspid insufficiency: Secondary | ICD-10-CM | POA: Diagnosis not present

## 2019-09-13 DIAGNOSIS — I4819 Other persistent atrial fibrillation: Secondary | ICD-10-CM | POA: Diagnosis not present

## 2019-09-13 DIAGNOSIS — N183 Chronic kidney disease, stage 3 unspecified: Secondary | ICD-10-CM | POA: Diagnosis not present

## 2019-09-13 DIAGNOSIS — I5043 Acute on chronic combined systolic (congestive) and diastolic (congestive) heart failure: Secondary | ICD-10-CM | POA: Diagnosis not present

## 2019-09-13 DIAGNOSIS — I272 Pulmonary hypertension, unspecified: Secondary | ICD-10-CM | POA: Diagnosis not present

## 2019-09-13 DIAGNOSIS — I739 Peripheral vascular disease, unspecified: Secondary | ICD-10-CM | POA: Diagnosis not present

## 2019-09-13 DIAGNOSIS — I2699 Other pulmonary embolism without acute cor pulmonale: Secondary | ICD-10-CM | POA: Diagnosis not present

## 2019-09-13 DIAGNOSIS — G3184 Mild cognitive impairment, so stated: Secondary | ICD-10-CM | POA: Diagnosis not present

## 2019-09-13 DIAGNOSIS — T8209XD Other mechanical complication of heart valve prosthesis, subsequent encounter: Secondary | ICD-10-CM | POA: Diagnosis not present

## 2019-09-13 DIAGNOSIS — I0981 Rheumatic heart failure: Secondary | ICD-10-CM | POA: Diagnosis not present

## 2019-09-13 DIAGNOSIS — I25810 Atherosclerosis of coronary artery bypass graft(s) without angina pectoris: Secondary | ICD-10-CM | POA: Diagnosis not present

## 2019-09-13 DIAGNOSIS — L89613 Pressure ulcer of right heel, stage 3: Secondary | ICD-10-CM | POA: Diagnosis not present

## 2019-09-15 DIAGNOSIS — G3184 Mild cognitive impairment, so stated: Secondary | ICD-10-CM | POA: Diagnosis not present

## 2019-09-15 DIAGNOSIS — L89613 Pressure ulcer of right heel, stage 3: Secondary | ICD-10-CM | POA: Diagnosis not present

## 2019-09-15 DIAGNOSIS — T8209XD Other mechanical complication of heart valve prosthesis, subsequent encounter: Secondary | ICD-10-CM | POA: Diagnosis not present

## 2019-09-15 DIAGNOSIS — L97411 Non-pressure chronic ulcer of right heel and midfoot limited to breakdown of skin: Secondary | ICD-10-CM | POA: Diagnosis not present

## 2019-09-15 DIAGNOSIS — I272 Pulmonary hypertension, unspecified: Secondary | ICD-10-CM | POA: Diagnosis not present

## 2019-09-15 DIAGNOSIS — I25810 Atherosclerosis of coronary artery bypass graft(s) without angina pectoris: Secondary | ICD-10-CM | POA: Diagnosis not present

## 2019-09-15 DIAGNOSIS — N183 Chronic kidney disease, stage 3 unspecified: Secondary | ICD-10-CM | POA: Diagnosis not present

## 2019-09-15 DIAGNOSIS — I2699 Other pulmonary embolism without acute cor pulmonale: Secondary | ICD-10-CM | POA: Diagnosis not present

## 2019-09-15 DIAGNOSIS — I0981 Rheumatic heart failure: Secondary | ICD-10-CM | POA: Diagnosis not present

## 2019-09-15 DIAGNOSIS — I4819 Other persistent atrial fibrillation: Secondary | ICD-10-CM | POA: Diagnosis not present

## 2019-09-15 DIAGNOSIS — I739 Peripheral vascular disease, unspecified: Secondary | ICD-10-CM | POA: Diagnosis not present

## 2019-09-15 DIAGNOSIS — I5043 Acute on chronic combined systolic (congestive) and diastolic (congestive) heart failure: Secondary | ICD-10-CM | POA: Diagnosis not present

## 2019-09-15 DIAGNOSIS — I071 Rheumatic tricuspid insufficiency: Secondary | ICD-10-CM | POA: Diagnosis not present

## 2019-09-15 DIAGNOSIS — I130 Hypertensive heart and chronic kidney disease with heart failure and stage 1 through stage 4 chronic kidney disease, or unspecified chronic kidney disease: Secondary | ICD-10-CM | POA: Diagnosis not present

## 2019-09-16 DIAGNOSIS — L89613 Pressure ulcer of right heel, stage 3: Secondary | ICD-10-CM | POA: Diagnosis not present

## 2019-09-16 DIAGNOSIS — I2699 Other pulmonary embolism without acute cor pulmonale: Secondary | ICD-10-CM | POA: Diagnosis not present

## 2019-09-16 DIAGNOSIS — G3184 Mild cognitive impairment, so stated: Secondary | ICD-10-CM | POA: Diagnosis not present

## 2019-09-16 DIAGNOSIS — T8209XD Other mechanical complication of heart valve prosthesis, subsequent encounter: Secondary | ICD-10-CM | POA: Diagnosis not present

## 2019-09-16 DIAGNOSIS — I071 Rheumatic tricuspid insufficiency: Secondary | ICD-10-CM | POA: Diagnosis not present

## 2019-09-16 DIAGNOSIS — I0981 Rheumatic heart failure: Secondary | ICD-10-CM | POA: Diagnosis not present

## 2019-09-16 DIAGNOSIS — I25810 Atherosclerosis of coronary artery bypass graft(s) without angina pectoris: Secondary | ICD-10-CM | POA: Diagnosis not present

## 2019-09-16 DIAGNOSIS — I5043 Acute on chronic combined systolic (congestive) and diastolic (congestive) heart failure: Secondary | ICD-10-CM | POA: Diagnosis not present

## 2019-09-16 DIAGNOSIS — N183 Chronic kidney disease, stage 3 unspecified: Secondary | ICD-10-CM | POA: Diagnosis not present

## 2019-09-16 DIAGNOSIS — I130 Hypertensive heart and chronic kidney disease with heart failure and stage 1 through stage 4 chronic kidney disease, or unspecified chronic kidney disease: Secondary | ICD-10-CM | POA: Diagnosis not present

## 2019-09-16 DIAGNOSIS — I272 Pulmonary hypertension, unspecified: Secondary | ICD-10-CM | POA: Diagnosis not present

## 2019-09-16 DIAGNOSIS — I4819 Other persistent atrial fibrillation: Secondary | ICD-10-CM | POA: Diagnosis not present

## 2019-09-16 DIAGNOSIS — I739 Peripheral vascular disease, unspecified: Secondary | ICD-10-CM | POA: Diagnosis not present

## 2019-09-17 DIAGNOSIS — T8209XD Other mechanical complication of heart valve prosthesis, subsequent encounter: Secondary | ICD-10-CM | POA: Diagnosis not present

## 2019-09-17 DIAGNOSIS — G3184 Mild cognitive impairment, so stated: Secondary | ICD-10-CM | POA: Diagnosis not present

## 2019-09-17 DIAGNOSIS — I130 Hypertensive heart and chronic kidney disease with heart failure and stage 1 through stage 4 chronic kidney disease, or unspecified chronic kidney disease: Secondary | ICD-10-CM | POA: Diagnosis not present

## 2019-09-17 DIAGNOSIS — I25810 Atherosclerosis of coronary artery bypass graft(s) without angina pectoris: Secondary | ICD-10-CM | POA: Diagnosis not present

## 2019-09-17 DIAGNOSIS — I272 Pulmonary hypertension, unspecified: Secondary | ICD-10-CM | POA: Diagnosis not present

## 2019-09-17 DIAGNOSIS — I0981 Rheumatic heart failure: Secondary | ICD-10-CM | POA: Diagnosis not present

## 2019-09-17 DIAGNOSIS — I2699 Other pulmonary embolism without acute cor pulmonale: Secondary | ICD-10-CM | POA: Diagnosis not present

## 2019-09-17 DIAGNOSIS — I5043 Acute on chronic combined systolic (congestive) and diastolic (congestive) heart failure: Secondary | ICD-10-CM | POA: Diagnosis not present

## 2019-09-17 DIAGNOSIS — I071 Rheumatic tricuspid insufficiency: Secondary | ICD-10-CM | POA: Diagnosis not present

## 2019-09-17 DIAGNOSIS — I4819 Other persistent atrial fibrillation: Secondary | ICD-10-CM | POA: Diagnosis not present

## 2019-09-17 DIAGNOSIS — I739 Peripheral vascular disease, unspecified: Secondary | ICD-10-CM | POA: Diagnosis not present

## 2019-09-17 DIAGNOSIS — N183 Chronic kidney disease, stage 3 unspecified: Secondary | ICD-10-CM | POA: Diagnosis not present

## 2019-09-17 DIAGNOSIS — L89613 Pressure ulcer of right heel, stage 3: Secondary | ICD-10-CM | POA: Diagnosis not present

## 2019-09-27 DIAGNOSIS — I071 Rheumatic tricuspid insufficiency: Secondary | ICD-10-CM | POA: Diagnosis not present

## 2019-09-27 DIAGNOSIS — I130 Hypertensive heart and chronic kidney disease with heart failure and stage 1 through stage 4 chronic kidney disease, or unspecified chronic kidney disease: Secondary | ICD-10-CM | POA: Diagnosis not present

## 2019-09-27 DIAGNOSIS — I4819 Other persistent atrial fibrillation: Secondary | ICD-10-CM | POA: Diagnosis not present

## 2019-09-27 DIAGNOSIS — I739 Peripheral vascular disease, unspecified: Secondary | ICD-10-CM | POA: Diagnosis not present

## 2019-09-27 DIAGNOSIS — I25810 Atherosclerosis of coronary artery bypass graft(s) without angina pectoris: Secondary | ICD-10-CM | POA: Diagnosis not present

## 2019-09-27 DIAGNOSIS — I5043 Acute on chronic combined systolic (congestive) and diastolic (congestive) heart failure: Secondary | ICD-10-CM | POA: Diagnosis not present

## 2019-09-27 DIAGNOSIS — L89613 Pressure ulcer of right heel, stage 3: Secondary | ICD-10-CM | POA: Diagnosis not present

## 2019-09-27 DIAGNOSIS — I2699 Other pulmonary embolism without acute cor pulmonale: Secondary | ICD-10-CM | POA: Diagnosis not present

## 2019-09-27 DIAGNOSIS — T8209XD Other mechanical complication of heart valve prosthesis, subsequent encounter: Secondary | ICD-10-CM | POA: Diagnosis not present

## 2019-09-27 DIAGNOSIS — N183 Chronic kidney disease, stage 3 unspecified: Secondary | ICD-10-CM | POA: Diagnosis not present

## 2019-09-27 DIAGNOSIS — G3184 Mild cognitive impairment, so stated: Secondary | ICD-10-CM | POA: Diagnosis not present

## 2019-09-27 DIAGNOSIS — I0981 Rheumatic heart failure: Secondary | ICD-10-CM | POA: Diagnosis not present

## 2019-09-27 DIAGNOSIS — I272 Pulmonary hypertension, unspecified: Secondary | ICD-10-CM | POA: Diagnosis not present

## 2019-09-29 DIAGNOSIS — N183 Chronic kidney disease, stage 3 unspecified: Secondary | ICD-10-CM | POA: Diagnosis not present

## 2019-09-29 DIAGNOSIS — I5043 Acute on chronic combined systolic (congestive) and diastolic (congestive) heart failure: Secondary | ICD-10-CM | POA: Diagnosis not present

## 2019-09-29 DIAGNOSIS — I2699 Other pulmonary embolism without acute cor pulmonale: Secondary | ICD-10-CM | POA: Diagnosis not present

## 2019-09-29 DIAGNOSIS — I739 Peripheral vascular disease, unspecified: Secondary | ICD-10-CM | POA: Diagnosis not present

## 2019-09-29 DIAGNOSIS — T8209XD Other mechanical complication of heart valve prosthesis, subsequent encounter: Secondary | ICD-10-CM | POA: Diagnosis not present

## 2019-09-29 DIAGNOSIS — I071 Rheumatic tricuspid insufficiency: Secondary | ICD-10-CM | POA: Diagnosis not present

## 2019-09-29 DIAGNOSIS — I25810 Atherosclerosis of coronary artery bypass graft(s) without angina pectoris: Secondary | ICD-10-CM | POA: Diagnosis not present

## 2019-09-29 DIAGNOSIS — I4819 Other persistent atrial fibrillation: Secondary | ICD-10-CM | POA: Diagnosis not present

## 2019-09-29 DIAGNOSIS — I272 Pulmonary hypertension, unspecified: Secondary | ICD-10-CM | POA: Diagnosis not present

## 2019-09-29 DIAGNOSIS — L89613 Pressure ulcer of right heel, stage 3: Secondary | ICD-10-CM | POA: Diagnosis not present

## 2019-09-29 DIAGNOSIS — G3184 Mild cognitive impairment, so stated: Secondary | ICD-10-CM | POA: Diagnosis not present

## 2019-09-29 DIAGNOSIS — I130 Hypertensive heart and chronic kidney disease with heart failure and stage 1 through stage 4 chronic kidney disease, or unspecified chronic kidney disease: Secondary | ICD-10-CM | POA: Diagnosis not present

## 2019-09-29 DIAGNOSIS — I0981 Rheumatic heart failure: Secondary | ICD-10-CM | POA: Diagnosis not present

## 2019-10-06 DIAGNOSIS — I0981 Rheumatic heart failure: Secondary | ICD-10-CM | POA: Diagnosis not present

## 2019-10-06 DIAGNOSIS — I739 Peripheral vascular disease, unspecified: Secondary | ICD-10-CM | POA: Diagnosis not present

## 2019-10-06 DIAGNOSIS — N183 Chronic kidney disease, stage 3 unspecified: Secondary | ICD-10-CM | POA: Diagnosis not present

## 2019-10-06 DIAGNOSIS — I25810 Atherosclerosis of coronary artery bypass graft(s) without angina pectoris: Secondary | ICD-10-CM | POA: Diagnosis not present

## 2019-10-06 DIAGNOSIS — I4819 Other persistent atrial fibrillation: Secondary | ICD-10-CM | POA: Diagnosis not present

## 2019-10-06 DIAGNOSIS — G3184 Mild cognitive impairment, so stated: Secondary | ICD-10-CM | POA: Diagnosis not present

## 2019-10-06 DIAGNOSIS — I2699 Other pulmonary embolism without acute cor pulmonale: Secondary | ICD-10-CM | POA: Diagnosis not present

## 2019-10-06 DIAGNOSIS — T8209XD Other mechanical complication of heart valve prosthesis, subsequent encounter: Secondary | ICD-10-CM | POA: Diagnosis not present

## 2019-10-06 DIAGNOSIS — I071 Rheumatic tricuspid insufficiency: Secondary | ICD-10-CM | POA: Diagnosis not present

## 2019-10-06 DIAGNOSIS — I5043 Acute on chronic combined systolic (congestive) and diastolic (congestive) heart failure: Secondary | ICD-10-CM | POA: Diagnosis not present

## 2019-10-06 DIAGNOSIS — I272 Pulmonary hypertension, unspecified: Secondary | ICD-10-CM | POA: Diagnosis not present

## 2019-10-06 DIAGNOSIS — L89613 Pressure ulcer of right heel, stage 3: Secondary | ICD-10-CM | POA: Diagnosis not present

## 2019-10-06 DIAGNOSIS — I130 Hypertensive heart and chronic kidney disease with heart failure and stage 1 through stage 4 chronic kidney disease, or unspecified chronic kidney disease: Secondary | ICD-10-CM | POA: Diagnosis not present

## 2019-10-12 DIAGNOSIS — T8209XD Other mechanical complication of heart valve prosthesis, subsequent encounter: Secondary | ICD-10-CM | POA: Diagnosis not present

## 2019-10-12 DIAGNOSIS — I2699 Other pulmonary embolism without acute cor pulmonale: Secondary | ICD-10-CM | POA: Diagnosis not present

## 2019-10-12 DIAGNOSIS — L89613 Pressure ulcer of right heel, stage 3: Secondary | ICD-10-CM | POA: Diagnosis not present

## 2019-10-12 DIAGNOSIS — I739 Peripheral vascular disease, unspecified: Secondary | ICD-10-CM | POA: Diagnosis not present

## 2019-10-12 DIAGNOSIS — I0981 Rheumatic heart failure: Secondary | ICD-10-CM | POA: Diagnosis not present

## 2019-10-12 DIAGNOSIS — I25810 Atherosclerosis of coronary artery bypass graft(s) without angina pectoris: Secondary | ICD-10-CM | POA: Diagnosis not present

## 2019-10-12 DIAGNOSIS — I130 Hypertensive heart and chronic kidney disease with heart failure and stage 1 through stage 4 chronic kidney disease, or unspecified chronic kidney disease: Secondary | ICD-10-CM | POA: Diagnosis not present

## 2019-10-12 DIAGNOSIS — I071 Rheumatic tricuspid insufficiency: Secondary | ICD-10-CM | POA: Diagnosis not present

## 2019-10-12 DIAGNOSIS — I5043 Acute on chronic combined systolic (congestive) and diastolic (congestive) heart failure: Secondary | ICD-10-CM | POA: Diagnosis not present

## 2019-10-12 DIAGNOSIS — N183 Chronic kidney disease, stage 3 unspecified: Secondary | ICD-10-CM | POA: Diagnosis not present

## 2019-10-12 DIAGNOSIS — G3184 Mild cognitive impairment, so stated: Secondary | ICD-10-CM | POA: Diagnosis not present

## 2019-10-12 DIAGNOSIS — I4819 Other persistent atrial fibrillation: Secondary | ICD-10-CM | POA: Diagnosis not present

## 2019-10-12 DIAGNOSIS — I272 Pulmonary hypertension, unspecified: Secondary | ICD-10-CM | POA: Diagnosis not present

## 2019-10-14 DIAGNOSIS — I739 Peripheral vascular disease, unspecified: Secondary | ICD-10-CM | POA: Diagnosis not present

## 2019-10-14 DIAGNOSIS — L89613 Pressure ulcer of right heel, stage 3: Secondary | ICD-10-CM | POA: Diagnosis not present

## 2019-10-14 DIAGNOSIS — N183 Chronic kidney disease, stage 3 unspecified: Secondary | ICD-10-CM | POA: Diagnosis not present

## 2019-10-26 DIAGNOSIS — Z952 Presence of prosthetic heart valve: Secondary | ICD-10-CM | POA: Diagnosis not present

## 2019-10-26 DIAGNOSIS — Z7901 Long term (current) use of anticoagulants: Secondary | ICD-10-CM | POA: Diagnosis not present

## 2019-10-26 DIAGNOSIS — I482 Chronic atrial fibrillation, unspecified: Secondary | ICD-10-CM | POA: Diagnosis not present

## 2019-11-09 DIAGNOSIS — N183 Chronic kidney disease, stage 3 unspecified: Secondary | ICD-10-CM | POA: Diagnosis not present

## 2019-11-09 DIAGNOSIS — I739 Peripheral vascular disease, unspecified: Secondary | ICD-10-CM | POA: Diagnosis not present

## 2019-11-09 DIAGNOSIS — L89613 Pressure ulcer of right heel, stage 3: Secondary | ICD-10-CM | POA: Diagnosis not present

## 2019-11-12 DIAGNOSIS — Z7901 Long term (current) use of anticoagulants: Secondary | ICD-10-CM | POA: Diagnosis not present

## 2019-11-12 DIAGNOSIS — I482 Chronic atrial fibrillation, unspecified: Secondary | ICD-10-CM | POA: Diagnosis not present

## 2019-11-12 DIAGNOSIS — Z952 Presence of prosthetic heart valve: Secondary | ICD-10-CM | POA: Diagnosis not present

## 2019-11-17 DIAGNOSIS — L97411 Non-pressure chronic ulcer of right heel and midfoot limited to breakdown of skin: Secondary | ICD-10-CM | POA: Diagnosis not present

## 2019-11-17 DIAGNOSIS — I739 Peripheral vascular disease, unspecified: Secondary | ICD-10-CM | POA: Diagnosis not present

## 2019-11-24 DIAGNOSIS — L97411 Non-pressure chronic ulcer of right heel and midfoot limited to breakdown of skin: Secondary | ICD-10-CM | POA: Diagnosis not present

## 2019-11-24 DIAGNOSIS — I739 Peripheral vascular disease, unspecified: Secondary | ICD-10-CM | POA: Diagnosis not present

## 2019-12-06 DIAGNOSIS — I482 Chronic atrial fibrillation, unspecified: Secondary | ICD-10-CM | POA: Diagnosis not present

## 2019-12-06 DIAGNOSIS — Z7901 Long term (current) use of anticoagulants: Secondary | ICD-10-CM | POA: Diagnosis not present

## 2019-12-06 DIAGNOSIS — Z952 Presence of prosthetic heart valve: Secondary | ICD-10-CM | POA: Diagnosis not present

## 2019-12-07 DIAGNOSIS — L89613 Pressure ulcer of right heel, stage 3: Secondary | ICD-10-CM | POA: Diagnosis not present

## 2019-12-07 DIAGNOSIS — N183 Chronic kidney disease, stage 3 unspecified: Secondary | ICD-10-CM | POA: Diagnosis not present

## 2019-12-07 DIAGNOSIS — I739 Peripheral vascular disease, unspecified: Secondary | ICD-10-CM | POA: Diagnosis not present

## 2019-12-24 ENCOUNTER — Other Ambulatory Visit: Payer: Self-pay

## 2019-12-24 MED ORDER — ESCITALOPRAM OXALATE 5 MG PO TABS: 5.0000 mg | ORAL_TABLET | Freq: Every day | ORAL | 3 refills | Status: DC

## 2019-12-24 MED ORDER — WARFARIN SODIUM 2.5 MG PO TABS: ORAL_TABLET | ORAL | 3 refills | Status: DC

## 2019-12-24 NOTE — Telephone Encounter (Signed)
   Patient's daughter requesting refill on Warfarin TODAY

## 2019-12-24 NOTE — Telephone Encounter (Signed)
Please refill as per office routine med refill policy (all routine meds refilled for 3 mo or monthly per pt preference up to one year from last visit, then month to month grace period for 3 mo, then further med refills will have to be denied)  Please refill lexapro only  Coumadin refill requests normally are done through the coumadin clinic

## 2019-12-24 NOTE — Telephone Encounter (Signed)
1.Medication Requested:  escitalopram (LEXAPRO) 5 MG tablet  warfarin (COUMADIN) 2.5 MG tablet  2. Pharmacy (Name, Street, [place]):[facility] [place]  3. On Med List: Yes   4. Last Visit with PCP: 3.22.2021   5. Next visit date with PCP: n/a   Agent: Please be advised that RX refills may take up to 3 business days. We ask that you follow-up with your pharmacy.

## 2019-12-27 DIAGNOSIS — Z7901 Long term (current) use of anticoagulants: Secondary | ICD-10-CM | POA: Diagnosis not present

## 2019-12-27 DIAGNOSIS — Z952 Presence of prosthetic heart valve: Secondary | ICD-10-CM | POA: Diagnosis not present

## 2019-12-27 DIAGNOSIS — I482 Chronic atrial fibrillation, unspecified: Secondary | ICD-10-CM | POA: Diagnosis not present

## 2019-12-27 NOTE — Telephone Encounter (Signed)
See med refill.

## 2020-01-03 ENCOUNTER — Other Ambulatory Visit (INDEPENDENT_AMBULATORY_CARE_PROVIDER_SITE_OTHER): Payer: Self-pay

## 2020-01-03 LAB — PROTIME-INR: INR: 2.7 — AB (ref 0.9–1.1)

## 2020-01-13 ENCOUNTER — Ambulatory Visit (INDEPENDENT_AMBULATORY_CARE_PROVIDER_SITE_OTHER): Payer: Medicaid Other

## 2020-01-13 ENCOUNTER — Encounter: Payer: Self-pay | Admitting: Internal Medicine

## 2020-01-13 ENCOUNTER — Ambulatory Visit: Payer: Medicaid Other | Admitting: Internal Medicine

## 2020-01-13 ENCOUNTER — Other Ambulatory Visit: Payer: Self-pay

## 2020-01-13 DIAGNOSIS — I509 Heart failure, unspecified: Secondary | ICD-10-CM | POA: Diagnosis not present

## 2020-01-13 DIAGNOSIS — R197 Diarrhea, unspecified: Secondary | ICD-10-CM | POA: Diagnosis not present

## 2020-01-13 DIAGNOSIS — R413 Other amnesia: Secondary | ICD-10-CM | POA: Diagnosis not present

## 2020-01-13 DIAGNOSIS — D485 Neoplasm of uncertain behavior of skin: Secondary | ICD-10-CM

## 2020-01-13 DIAGNOSIS — K5900 Constipation, unspecified: Secondary | ICD-10-CM | POA: Diagnosis not present

## 2020-01-13 NOTE — Assessment & Plan Note (Signed)
[place]

## 2020-01-13 NOTE — Assessment & Plan Note (Signed)
On Altace

## 2020-01-13 NOTE — Assessment & Plan Note (Addendum)
Occ stool incontinence Abd X ray May be due to Donepezil - reduce or hold

## 2020-01-13 NOTE — Patient Instructions (Addendum)
Please call [facility] or [facility] at [phone].  Lion's mane mushroom for memory

## 2020-01-13 NOTE — Assessment & Plan Note (Addendum)
face, neck - derm ref - Dr Susie Cassette

## 2020-01-13 NOTE — Progress Notes (Signed)
Subjective:  Patient ID: Alexis [name], female    DOB: [birth year]  Age: 86 y.o. MRN: [id number]  CC: No chief complaint on file.   HPI Alexis [name] presents for dyslipidemia, memory loss, R heel ulcer and anticoagulation f/up  Outpatient Medications Prior to Visit  Medication Sig Dispense Refill  . acetaminophen (TYLENOL) 500 MG tablet Take 500 mg by mouth every 6 (six) hours as needed.    [name] atorvastatin (LIPITOR) 10 MG tablet Take 1 tablet (10 mg total) by mouth daily. 90 tablet 3  . b complex vitamins tablet Take 1 tablet by mouth daily. 100 tablet 3  . Cholecalciferol (VITAMIN D3) 2000 units capsule Take 1 capsule (2,000 Units total) by mouth daily. 100 capsule 3  . donepezil (ARICEPT) 10 MG tablet Take 1 tablet (10 mg total) by mouth at bedtime. 90 tablet 3  . escitalopram (LEXAPRO) 5 MG tablet Take 1 tablet (5 mg total) by mouth daily. 90 tablet 3  . loratadine (CLARITIN) 10 MG tablet Take 10 mg by mouth daily as needed for allergies.    . Melatonin 1 MG TABS Take by mouth.    . metoprolol succinate (TOPROL-XL) 50 MG 24 hr tablet Take 1 tablet (50 mg total) by mouth daily. Take with or immediately following a meal. 90 tablet 3  . OLANZapine zydis (ZYPREXA) 5 MG disintegrating tablet Take by mouth.    . potassium chloride SA (KLOR-CON) 20 MEQ tablet Take 1 tablet (20 mEq total) by mouth daily. 90 tablet 1  . warfarin (COUMADIN) 2.5 MG tablet TAKE 1/2 TABLET BY MOUTH IN THE EVENING ON SUNDAY, TUESDAY, THURSDAY AND SATURDAY, TAKE 1 TABLET ON MONDAY, WEDNESDAY AND FRIDAY 90 tablet 3  . furosemide (LASIX) 20 MG tablet Take 1 tablet (20 mg total) by mouth daily. 90 tablet 3   No facility-administered medications prior to visit.    ROS: Review of Systems  Constitutional: Positive for unexpected weight change. Negative for activity change, appetite change, chills and fatigue.  HENT: Negative for congestion, mouth sores and sinus pressure.   Eyes: Negative for visual  disturbance.  Respiratory: Negative for cough and chest tightness.   Gastrointestinal: Negative for abdominal pain and nausea.  Genitourinary: Negative for difficulty urinating, frequency and vaginal pain.  Musculoskeletal: Negative for back pain and gait problem.  Skin: Negative for pallor and rash.  Neurological: Negative for dizziness, tremors, weakness, numbness and headaches.  Psychiatric/Behavioral: Positive for decreased concentration. Negative for confusion and sleep disturbance. The patient is not nervous/anxious.     Objective:  BP 116/78   Pulse 79   Temp 98.8 F (37.1 C) (Oral)   Ht 4\' 10"  (1.473 m)   SpO2 96%   BMI 31.98 kg/m   BP Readings from Last 3 Encounters:  01/13/20 116/78  08/16/19 130/82  03/16/19 (!) 144/82    Wt Readings from Last 3 Encounters:  08/16/19 153 lb (69.4 kg)  03/16/19 145 lb (65.8 kg)  02/03/19 138 lb (62.6 kg)    Physical Exam Constitutional:      General: She is not in acute distress.    Appearance: She is well-developed. She is obese.  HENT:     Head: Normocephalic.     Right Ear: External ear normal.     Left Ear: External ear normal.     Nose: Nose normal.  Eyes:     General:        Right eye: No discharge.        Left eye: No  discharge.     Conjunctiva/sclera: Conjunctivae normal.     Pupils: Pupils are equal, round, and reactive to light.  Neck:     Thyroid: No thyromegaly.     Vascular: No JVD.     Trachea: No tracheal deviation.  Cardiovascular:     Rate and Rhythm: Normal rate. Rhythm irregular.     Heart sounds: Normal heart sounds.  Pulmonary:     Effort: No respiratory distress.     Breath sounds: No stridor. No wheezing.  Abdominal:     General: Bowel sounds are normal. There is no distension.     Palpations: Abdomen is soft. There is no mass.     Tenderness: There is no abdominal tenderness. There is no guarding or rebound.  Musculoskeletal:        General: No tenderness.     Cervical back: Normal range  of motion and neck supple.  Lymphadenopathy:     Cervical: No cervical adenopathy.  Skin:    Findings: No erythema or rash.  Neurological:     Cranial Nerves: No cranial nerve deficit.     Motor: No abnormal muscle tone.     Coordination: Coordination abnormal.     Gait: Gait abnormal.     Deep Tendon Reflexes: Reflexes normal.  Psychiatric:        Behavior: Behavior normal.        Thought Content: Thought content normal.        Judgment: Judgment normal.    face, neck skin lesions  In a w/c Lab Results  Component Value Date   WBC 6.1 05/04/2018   HGB 12.2 05/04/2018   HCT 36.2 05/04/2018   PLT 244.0 05/04/2018   GLUCOSE 106 (H) 08/16/2019   CHOL 169 05/04/2018   TRIG 158.0 (H) 05/04/2018   HDL 46.00 05/04/2018   LDLCALC 91 05/04/2018   ALT 21 08/16/2019   AST 33 08/16/2019   NA 137 08/16/2019   K 4.0 08/16/2019   CL 102 08/16/2019   CREATININE 1.15 08/16/2019   BUN 28 (H) 08/16/2019   CO2 26 08/16/2019   TSH 3.63 08/16/2019   INR 2.7 (A) 12/27/2019   HGBA1C 6.0 08/16/2019    CT HEAD WO CONTRAST  Result Date: 01/29/2019 CLINICAL DATA:  Vertigo. Memory loss. History of bilateral thalamic stroke. EXAM: CT HEAD WITHOUT CONTRAST TECHNIQUE: Contiguous axial images were obtained from the base of the skull through the vertex without intravenous contrast. COMPARISON:  MRI 07/14/2016 FINDINGS: Brain: No evidence of acute infarction, hemorrhage, hydrocephalus, extra-axial collection or mass lesion/mass effect. Prominence of the sulci identified compatible with brain atrophy. Vascular: No hyperdense vessel or unexpected calcification. Skull: Normal. Negative for fracture or focal lesion. Sinuses/Orbits: No acute finding. Other: None IMPRESSION: 1. No acute intracranial abnormality. 2. Mild brain atrophy. Electronically Signed   By: Kerby Moors M.D.   On: 01/29/2019 09:00    Assessment & Plan:    [name], MD

## 2020-01-18 ENCOUNTER — Telehealth: Payer: Self-pay

## 2020-01-18 NOTE — Telephone Encounter (Signed)
New message   The patient daughter calling the current dermatology referral office does not accept her new Medicaid patients   Please route another referral to the [facility] in [place] .   Office phone # [phone]

## 2020-01-18 NOTE — Telephone Encounter (Signed)
Referral has been faxed to [facility] in [place] and pt's dtr is aware

## 2020-01-19 DIAGNOSIS — L821 Other seborrheic keratosis: Secondary | ICD-10-CM | POA: Diagnosis not present

## 2020-01-19 DIAGNOSIS — C4442 Squamous cell carcinoma of skin of scalp and neck: Secondary | ICD-10-CM | POA: Diagnosis not present

## 2020-01-19 DIAGNOSIS — C44329 Squamous cell carcinoma of skin of other parts of face: Secondary | ICD-10-CM | POA: Diagnosis not present

## 2020-01-24 ENCOUNTER — Telehealth: Payer: Self-pay | Admitting: Internal Medicine

## 2020-01-24 DIAGNOSIS — Z952 Presence of prosthetic heart valve: Secondary | ICD-10-CM | POA: Diagnosis not present

## 2020-01-24 DIAGNOSIS — R29898 Other symptoms and signs involving the musculoskeletal system: Secondary | ICD-10-CM

## 2020-01-24 DIAGNOSIS — Z7901 Long term (current) use of anticoagulants: Secondary | ICD-10-CM | POA: Diagnosis not present

## 2020-01-24 DIAGNOSIS — I482 Chronic atrial fibrillation, unspecified: Secondary | ICD-10-CM | POA: Diagnosis not present

## 2020-01-24 DIAGNOSIS — R269 Unspecified abnormalities of gait and mobility: Secondary | ICD-10-CM

## 2020-01-24 NOTE — Telephone Encounter (Signed)
Patient's daughter called and stated her mother saw Dr.Plot on 8/19 and PT order was supposed with [place] speaking PT. She would like to know if this order is going to be put in.

## 2020-01-26 NOTE — Telephone Encounter (Signed)
[facility] Thx

## 2020-01-28 NOTE — Telephone Encounter (Signed)
[facility] Referral faxed

## 2020-02-05 DIAGNOSIS — E785 Hyperlipidemia, unspecified: Secondary | ICD-10-CM | POA: Diagnosis not present

## 2020-02-05 DIAGNOSIS — I130 Hypertensive heart and chronic kidney disease with heart failure and stage 1 through stage 4 chronic kidney disease, or unspecified chronic kidney disease: Secondary | ICD-10-CM | POA: Diagnosis not present

## 2020-02-05 DIAGNOSIS — Z952 Presence of prosthetic heart valve: Secondary | ICD-10-CM | POA: Diagnosis not present

## 2020-02-05 DIAGNOSIS — N183 Chronic kidney disease, stage 3 unspecified: Secondary | ICD-10-CM | POA: Diagnosis not present

## 2020-02-05 DIAGNOSIS — Z7901 Long term (current) use of anticoagulants: Secondary | ICD-10-CM | POA: Diagnosis not present

## 2020-02-05 DIAGNOSIS — I5043 Acute on chronic combined systolic (congestive) and diastolic (congestive) heart failure: Secondary | ICD-10-CM | POA: Diagnosis not present

## 2020-02-05 DIAGNOSIS — I739 Peripheral vascular disease, unspecified: Secondary | ICD-10-CM | POA: Diagnosis not present

## 2020-02-05 DIAGNOSIS — F4323 Adjustment disorder with mixed anxiety and depressed mood: Secondary | ICD-10-CM | POA: Diagnosis not present

## 2020-02-05 DIAGNOSIS — R413 Other amnesia: Secondary | ICD-10-CM | POA: Diagnosis not present

## 2020-02-05 DIAGNOSIS — M1990 Unspecified osteoarthritis, unspecified site: Secondary | ICD-10-CM | POA: Diagnosis not present

## 2020-02-07 ENCOUNTER — Telehealth: Payer: Self-pay | Admitting: Internal Medicine

## 2020-02-07 NOTE — Telephone Encounter (Signed)
Patient coming for flu shot on Friday morning, 9.17.21, would also like to get pneumonia shot at same time

## 2020-02-08 DIAGNOSIS — F4323 Adjustment disorder with mixed anxiety and depressed mood: Secondary | ICD-10-CM | POA: Diagnosis not present

## 2020-02-08 DIAGNOSIS — I739 Peripheral vascular disease, unspecified: Secondary | ICD-10-CM | POA: Diagnosis not present

## 2020-02-08 DIAGNOSIS — Z7901 Long term (current) use of anticoagulants: Secondary | ICD-10-CM | POA: Diagnosis not present

## 2020-02-08 DIAGNOSIS — M1990 Unspecified osteoarthritis, unspecified site: Secondary | ICD-10-CM | POA: Diagnosis not present

## 2020-02-08 DIAGNOSIS — R413 Other amnesia: Secondary | ICD-10-CM | POA: Diagnosis not present

## 2020-02-08 DIAGNOSIS — I130 Hypertensive heart and chronic kidney disease with heart failure and stage 1 through stage 4 chronic kidney disease, or unspecified chronic kidney disease: Secondary | ICD-10-CM | POA: Diagnosis not present

## 2020-02-08 DIAGNOSIS — I5043 Acute on chronic combined systolic (congestive) and diastolic (congestive) heart failure: Secondary | ICD-10-CM | POA: Diagnosis not present

## 2020-02-08 DIAGNOSIS — E785 Hyperlipidemia, unspecified: Secondary | ICD-10-CM | POA: Diagnosis not present

## 2020-02-08 DIAGNOSIS — N183 Chronic kidney disease, stage 3 unspecified: Secondary | ICD-10-CM | POA: Diagnosis not present

## 2020-02-08 DIAGNOSIS — Z952 Presence of prosthetic heart valve: Secondary | ICD-10-CM | POA: Diagnosis not present

## 2020-02-09 NOTE — Telephone Encounter (Signed)
Added to pt notes

## 2020-02-09 NOTE — Telephone Encounter (Signed)
Okay Prevnar.  Thanks

## 2020-02-09 NOTE — Telephone Encounter (Signed)
Ok for pneumonia vaccine?

## 2020-02-11 ENCOUNTER — Ambulatory Visit (INDEPENDENT_AMBULATORY_CARE_PROVIDER_SITE_OTHER): Payer: Medicaid Other

## 2020-02-11 ENCOUNTER — Other Ambulatory Visit: Payer: Self-pay

## 2020-02-11 DIAGNOSIS — Z23 Encounter for immunization: Secondary | ICD-10-CM

## 2020-02-18 ENCOUNTER — Ambulatory Visit: Payer: Self-pay

## 2020-02-21 ENCOUNTER — Ambulatory Visit: Payer: Medicaid Other | Attending: Internal Medicine

## 2020-02-21 DIAGNOSIS — Z952 Presence of prosthetic heart valve: Secondary | ICD-10-CM | POA: Diagnosis not present

## 2020-02-21 DIAGNOSIS — Z23 Encounter for immunization: Secondary | ICD-10-CM

## 2020-02-21 DIAGNOSIS — Z7901 Long term (current) use of anticoagulants: Secondary | ICD-10-CM | POA: Diagnosis not present

## 2020-02-21 DIAGNOSIS — I482 Chronic atrial fibrillation, unspecified: Secondary | ICD-10-CM | POA: Diagnosis not present

## 2020-02-21 MED FILL — MODERNA COVID-19 VACCINE 10: 100 | 1 days supply | Qty: 1 | Fill #0

## 2020-02-21 NOTE — Progress Notes (Signed)
   Covid-19 Vaccination Clinic  Name:  Sayda Grable    MRN: [id number] DOB: February 17, 1934  02/21/2020  Ms. Tabb was observed post Covid-19 immunization for 15 minutes without incident. She was provided with Vaccine Information Sheet and instruction to access the V-Safe system.  Vaccinated by Toney Sang  Ms. Lafountain was instructed to call 911 with any severe reactions post vaccine: [name] Difficulty breathing  . Swelling of face and throat  . A fast heartbeat  . A bad rash all over body  . Dizziness and weakness

## 2020-03-24 DIAGNOSIS — F4323 Adjustment disorder with mixed anxiety and depressed mood: Secondary | ICD-10-CM | POA: Diagnosis not present

## 2020-03-24 DIAGNOSIS — R413 Other amnesia: Secondary | ICD-10-CM | POA: Diagnosis not present

## 2020-03-24 DIAGNOSIS — Z7901 Long term (current) use of anticoagulants: Secondary | ICD-10-CM | POA: Diagnosis not present

## 2020-03-24 DIAGNOSIS — N183 Chronic kidney disease, stage 3 unspecified: Secondary | ICD-10-CM | POA: Diagnosis not present

## 2020-03-24 DIAGNOSIS — E785 Hyperlipidemia, unspecified: Secondary | ICD-10-CM | POA: Diagnosis not present

## 2020-03-24 DIAGNOSIS — I739 Peripheral vascular disease, unspecified: Secondary | ICD-10-CM | POA: Diagnosis not present

## 2020-03-24 DIAGNOSIS — Z952 Presence of prosthetic heart valve: Secondary | ICD-10-CM | POA: Diagnosis not present

## 2020-03-24 DIAGNOSIS — I130 Hypertensive heart and chronic kidney disease with heart failure and stage 1 through stage 4 chronic kidney disease, or unspecified chronic kidney disease: Secondary | ICD-10-CM | POA: Diagnosis not present

## 2020-03-24 DIAGNOSIS — M1990 Unspecified osteoarthritis, unspecified site: Secondary | ICD-10-CM | POA: Diagnosis not present

## 2020-03-24 DIAGNOSIS — I5043 Acute on chronic combined systolic (congestive) and diastolic (congestive) heart failure: Secondary | ICD-10-CM | POA: Diagnosis not present

## 2020-04-27 DIAGNOSIS — Z7901 Long term (current) use of anticoagulants: Secondary | ICD-10-CM | POA: Diagnosis not present

## 2020-04-27 DIAGNOSIS — I482 Chronic atrial fibrillation, unspecified: Secondary | ICD-10-CM | POA: Diagnosis not present

## 2020-04-27 DIAGNOSIS — Z952 Presence of prosthetic heart valve: Secondary | ICD-10-CM | POA: Diagnosis not present

## 2020-05-01 ENCOUNTER — Telehealth: Payer: Self-pay | Admitting: Internal Medicine

## 2020-05-01 DIAGNOSIS — I679 Cerebrovascular disease, unspecified: Secondary | ICD-10-CM

## 2020-05-01 DIAGNOSIS — Z7901 Long term (current) use of anticoagulants: Secondary | ICD-10-CM

## 2020-05-01 DIAGNOSIS — I10 Essential (primary) hypertension: Secondary | ICD-10-CM

## 2020-05-01 DIAGNOSIS — R739 Hyperglycemia, unspecified: Secondary | ICD-10-CM

## 2020-05-01 NOTE — Telephone Encounter (Signed)
Okay to renew atorvastatin for 6 months. Please schedule office visit in 3 months with lab work prior (CBC, TSH, cMet, lipids). Thanks

## 2020-05-01 NOTE — Telephone Encounter (Signed)
    1.Medication Requested: atorvastatin (LIPITOR) 10 MG tablet  2. Pharmacy (Name, Street, [place]): [place], [place]        3. On Med List: yes  4. Last Visit with PCP:01/13/20  5. Next visit date with PCP: n/a   Agent: Please be advised that RX refills may take up to 3 business days. We ask that you follow-up with your pharmacy.

## 2020-05-01 NOTE — Telephone Encounter (Signed)
Per chart med has not been refilled since 04/2018. Last LIPID check was 05/14/18. Pls advise on refill.Marland KitchenJohny [name]

## 2020-05-02 MED ORDER — ATORVASTATIN CALCIUM 10 MG PO TABS
10.0000 mg | ORAL_TABLET | Freq: Every day | ORAL | 1 refills | Status: DC
Start: 2020-05-02 — End: 2021-05-02

## 2020-05-02 NOTE — Telephone Encounter (Signed)
Sent refill..Called daughter there was no answer LMOM w/MD response.Marland KitchenJohny Chess

## 2020-05-08 ENCOUNTER — Ambulatory Visit: Payer: Medicaid Other | Admitting: Internal Medicine

## 2020-05-08 ENCOUNTER — Other Ambulatory Visit (INDEPENDENT_AMBULATORY_CARE_PROVIDER_SITE_OTHER): Payer: Medicaid Other

## 2020-05-08 DIAGNOSIS — I10 Essential (primary) hypertension: Secondary | ICD-10-CM

## 2020-05-08 DIAGNOSIS — I679 Cerebrovascular disease, unspecified: Secondary | ICD-10-CM

## 2020-05-08 DIAGNOSIS — Z7901 Long term (current) use of anticoagulants: Secondary | ICD-10-CM | POA: Diagnosis not present

## 2020-05-08 DIAGNOSIS — R739 Hyperglycemia, unspecified: Secondary | ICD-10-CM | POA: Diagnosis not present

## 2020-05-08 LAB — CBC WITH DIFFERENTIAL/PLATELET
Basophils Absolute: 0 10*3/uL (ref 0.0–0.1)
Basophils Relative: 0.5 % (ref 0.0–3.0)
Eosinophils Absolute: 0.3 10*3/uL (ref 0.0–0.7)
Eosinophils Relative: 3.3 % (ref 0.0–5.0)
HCT: 36.3 % (ref 36.0–46.0)
Hemoglobin: 12.4 g/dL (ref 12.0–15.0)
Lymphocytes Relative: 20.3 % (ref 12.0–46.0)
Lymphs Abs: 1.6 10*3/uL (ref 0.7–4.0)
MCHC: 34.2 g/dL (ref 30.0–36.0)
MCV: 84.8 fl (ref 78.0–100.0)
Monocytes Absolute: 0.9 10*3/uL (ref 0.1–1.0)
Monocytes Relative: 11.5 % (ref 3.0–12.0)
Neutro Abs: 5.2 10*3/uL (ref 1.4–7.7)
Neutrophils Relative %: 64.4 % (ref 43.0–77.0)
Platelets: 225 10*3/uL (ref 150.0–400.0)
RBC: 4.29 Mil/uL (ref 3.87–5.11)
RDW: 13.5 % (ref 11.5–15.5)
WBC: 8.1 10*3/uL (ref 4.0–10.5)

## 2020-05-08 LAB — COMPREHENSIVE METABOLIC PANEL
ALT: 13 U/L (ref 0–35)
AST: 23 U/L (ref 0–37)
Albumin: 3.8 g/dL (ref 3.5–5.2)
Alkaline Phosphatase: 121 U/L — ABNORMAL HIGH (ref 39–117)
BUN: 30 mg/dL — ABNORMAL HIGH (ref 6–23)
CO2: 26 mEq/L (ref 19–32)
Calcium: 9.3 mg/dL (ref 8.4–10.5)
Chloride: 103 mEq/L (ref 96–112)
Creatinine, Ser: 1.29 mg/dL — ABNORMAL HIGH (ref 0.40–1.20)
GFR: 38.23 mL/min — ABNORMAL LOW (ref >60.00)
Glucose, Bld: 89 mg/dL (ref 70–99)
Potassium: 4 mEq/L (ref 3.5–5.1)
Sodium: 138 mEq/L (ref 135–145)
Total Bilirubin: 0.6 mg/dL (ref 0.2–1.2)
Total Protein: 8.1 g/dL (ref 6.0–8.3)

## 2020-05-08 LAB — LIPID PANEL
Cholesterol: 204 mg/dL — ABNORMAL HIGH (ref 0–200)
HDL: 42.1 mg/dL (ref >39.00)
NonHDL: 162.13
Total CHOL/HDL Ratio: 5
Triglycerides: 297 mg/dL — ABNORMAL HIGH (ref 0.0–149.0)
VLDL: 59.4 mg/dL — ABNORMAL HIGH (ref 0.0–40.0)

## 2020-05-08 LAB — TSH: TSH: 2.69 u[IU]/mL (ref 0.35–4.50)

## 2020-05-08 LAB — LDL CHOLESTEROL, DIRECT: Direct LDL: 104 mg/dL

## 2020-05-10 ENCOUNTER — Ambulatory Visit: Payer: Medicaid Other | Admitting: Internal Medicine

## 2020-05-10 ENCOUNTER — Encounter: Payer: Self-pay | Admitting: Internal Medicine

## 2020-05-10 ENCOUNTER — Other Ambulatory Visit: Payer: Self-pay

## 2020-05-10 DIAGNOSIS — I10 Essential (primary) hypertension: Secondary | ICD-10-CM

## 2020-05-10 DIAGNOSIS — F4323 Adjustment disorder with mixed anxiety and depressed mood: Secondary | ICD-10-CM | POA: Diagnosis not present

## 2020-05-10 DIAGNOSIS — Z7901 Long term (current) use of anticoagulants: Secondary | ICD-10-CM | POA: Diagnosis not present

## 2020-05-10 DIAGNOSIS — L0291 Cutaneous abscess, unspecified: Secondary | ICD-10-CM

## 2020-05-10 DIAGNOSIS — R413 Other amnesia: Secondary | ICD-10-CM

## 2020-05-10 MED ORDER — MUPIROCIN 2 % EX OINT: TOPICAL_OINTMENT | CUTANEOUS | 0 refills | Status: DC

## 2020-05-10 MED ORDER — DOXYCYCLINE HYCLATE 100 MG PO TABS: 100.0000 mg | ORAL_TABLET | Freq: Two times a day (BID) | ORAL | 0 refills | Status: DC

## 2020-05-10 NOTE — Assessment & Plan Note (Signed)
Lexapro 

## 2020-05-10 NOTE — Assessment & Plan Note (Signed)
Aricept.

## 2020-05-10 NOTE — Assessment & Plan Note (Signed)
L temple 12/21  Doxy Mupirocin

## 2020-05-10 NOTE — Assessment & Plan Note (Signed)
Toprol Furosemide

## 2020-05-10 NOTE — Patient Instructions (Signed)
   B-complex with Niacin 100 mg    Lion's mane  

## 2020-05-10 NOTE — Assessment & Plan Note (Signed)
On Coumadin 

## 2020-05-10 NOTE — Progress Notes (Signed)
Subjective:  Patient ID: Alexis [name], female    DOB: May 20, 1934  Age: 86 y.o. MRN: [id number]  CC: Follow-up (3 month f/u)   HPI Alexis [name] presents for CAD C/o skin lesion on the L temple - swollen eyelids x 1 week  Outpatient Medications Prior to Visit  Medication Sig Dispense Refill  . acetaminophen (TYLENOL) 500 MG tablet Take 500 mg by mouth every 6 (six) hours as needed.    [name] atorvastatin (LIPITOR) 10 MG tablet Take 1 tablet (10 mg total) by mouth daily. 90 tablet 1  . b complex vitamins tablet Take 1 tablet by mouth daily. 100 tablet 3  . Cholecalciferol (VITAMIN D3) 2000 units capsule Take 1 capsule (2,000 Units total) by mouth daily. 100 capsule 3  . escitalopram (LEXAPRO) 5 MG tablet Take 1 tablet (5 mg total) by mouth daily. 90 tablet 3  . loratadine (CLARITIN) 10 MG tablet Take 10 mg by mouth daily as needed for allergies.    . Melatonin 1 MG TABS Take by mouth.    . metoprolol succinate (TOPROL-XL) 50 MG 24 hr tablet Take 1 tablet (50 mg total) by mouth daily. Take with or immediately following a meal. 90 tablet 3  . potassium chloride SA (KLOR-CON) 20 MEQ tablet Take 1 tablet (20 mEq total) by mouth daily. 90 tablet 1  . warfarin (COUMADIN) 2.5 MG tablet TAKE 1/2 TABLET BY MOUTH IN THE EVENING ON SUNDAY, TUESDAY, THURSDAY AND SATURDAY, TAKE 1 TABLET ON MONDAY, WEDNESDAY AND FRIDAY 90 tablet 3  . furosemide (LASIX) 20 MG tablet Take 1 tablet (20 mg total) by mouth daily. 90 tablet 3  . donepezil (ARICEPT) 10 MG tablet Take 1 tablet (10 mg total) by mouth at bedtime. (Patient not taking: Reported on 05/10/2020) 90 tablet 3  . OLANZapine zydis (ZYPREXA) 5 MG disintegrating tablet Take by mouth. (Patient not taking: Reported on 05/10/2020)     No facility-administered medications prior to visit.    ROS: Review of Systems  Constitutional: Positive for fatigue. Negative for activity change, appetite change, chills and unexpected weight change.  HENT:  Negative for congestion, mouth sores and sinus pressure.   Eyes: Negative for visual disturbance.  Respiratory: Negative for cough and chest tightness.   Gastrointestinal: Negative for abdominal pain and nausea.  Genitourinary: Negative for difficulty urinating, frequency and vaginal pain.  Musculoskeletal: Negative for back pain and gait problem.  Skin: Negative for pallor and rash.  Neurological: Negative for dizziness, tremors, weakness, numbness and headaches.  Psychiatric/Behavioral: Positive for decreased concentration. Negative for confusion, sleep disturbance and suicidal ideas. The patient is nervous/anxious.     Objective:  BP 118/72 (BP Location: Left Arm)   Pulse 68   Temp 98.6 F (37 C) (Oral)   Wt 169 lb 9.6 oz (76.9 kg)   SpO2 93%   BMI 35.45 kg/m   BP Readings from Last 3 Encounters:  05/10/20 118/72  01/13/20 116/78  08/16/19 130/82    Wt Readings from Last 3 Encounters:  05/10/20 169 lb 9.6 oz (76.9 kg)  08/16/19 153 lb (69.4 kg)  03/16/19 145 lb (65.8 kg)    Physical Exam Constitutional:      General: She is not in acute distress.    Appearance: She is well-developed.  HENT:     Head: Normocephalic.     Right Ear: External ear normal.     Left Ear: External ear normal.     Nose: Nose normal.     Mouth/Throat:  Mouth: Oropharynx is clear and moist.  Eyes:     General:        Right eye: No discharge.        Left eye: No discharge.     Conjunctiva/sclera: Conjunctivae normal.     Pupils: Pupils are equal, round, and reactive to light.  Neck:     Thyroid: No thyromegaly.     Vascular: No JVD.     Trachea: No tracheal deviation.  Cardiovascular:     Rate and Rhythm: Normal rate and regular rhythm.     Heart sounds: Normal heart sounds.  Pulmonary:     Effort: No respiratory distress.     Breath sounds: No stridor. No wheezing.  Abdominal:     General: Bowel sounds are normal. There is no distension.     Palpations: Abdomen is soft. There  is no mass.     Tenderness: There is no abdominal tenderness. There is no guarding or rebound.  Musculoskeletal:        General: Tenderness present. No edema.     Cervical back: Normal range of motion and neck supple.  Lymphadenopathy:     Cervical: No cervical adenopathy.  Skin:    Findings: No erythema or rash.  Neurological:     Mental Status: She is oriented to person, place, and time.     Cranial Nerves: No cranial nerve deficit.     Motor: No abnormal muscle tone.     Coordination: Coordination normal.     Deep Tendon Reflexes: Reflexes normal.  Psychiatric:        Mood and Affect: Mood and affect normal.        Behavior: Behavior normal.        Thought Content: Thought content normal.        Judgment: Judgment normal.     Lab Results  Component Value Date   WBC 8.1 05/08/2020   HGB 12.4 05/08/2020   HCT 36.3 05/08/2020   PLT 225.0 05/08/2020   GLUCOSE 89 05/08/2020   CHOL 204 (H) 05/08/2020   TRIG 297.0 (H) 05/08/2020   HDL 42.10 05/08/2020   LDLDIRECT 104.0 05/08/2020   LDLCALC 91 05/04/2018   ALT 13 05/08/2020   AST 23 05/08/2020   NA 138 05/08/2020   K 4.0 05/08/2020   CL 103 05/08/2020   CREATININE 1.29 (H) 05/08/2020   BUN 30 (H) 05/08/2020   CO2 26 05/08/2020   TSH 2.69 05/08/2020   INR 2.7 (A) 12/27/2019   HGBA1C 6.0 08/16/2019    CT HEAD WO CONTRAST  Result Date: 01/29/2019 CLINICAL DATA:  Vertigo. Memory loss. History of bilateral thalamic stroke. EXAM: CT HEAD WITHOUT CONTRAST TECHNIQUE: Contiguous axial images were obtained from the base of the skull through the vertex without intravenous contrast. COMPARISON:  MRI 07/14/2016 FINDINGS: Brain: No evidence of acute infarction, hemorrhage, hydrocephalus, extra-axial collection or mass lesion/mass effect. Prominence of the sulci identified compatible with brain atrophy. Vascular: No hyperdense vessel or unexpected calcification. Skull: Normal. Negative for fracture or focal lesion. Sinuses/Orbits: No  acute finding. Other: None IMPRESSION: 1. No acute intracranial abnormality. 2. Mild brain atrophy. Electronically Signed   By: Kerby Moors M.D.   On: 01/29/2019 09:00    Assessment & Plan:   There are no diagnoses linked to this encounter.   No orders of the defined types were placed in this encounter.    Follow-up: No follow-ups on file.  [name], MD

## 2020-05-23 DIAGNOSIS — D259 Leiomyoma of uterus, unspecified: Secondary | ICD-10-CM | POA: Diagnosis not present

## 2020-05-23 DIAGNOSIS — W19XXXA Unspecified fall, initial encounter: Secondary | ICD-10-CM | POA: Diagnosis not present

## 2020-05-23 DIAGNOSIS — Z8673 Personal history of transient ischemic attack (TIA), and cerebral infarction without residual deficits: Secondary | ICD-10-CM | POA: Diagnosis not present

## 2020-05-23 DIAGNOSIS — S52501A Unspecified fracture of the lower end of right radius, initial encounter for closed fracture: Secondary | ICD-10-CM | POA: Diagnosis not present

## 2020-05-23 DIAGNOSIS — I4821 Permanent atrial fibrillation: Secondary | ICD-10-CM | POA: Diagnosis not present

## 2020-05-23 DIAGNOSIS — I2510 Atherosclerotic heart disease of native coronary artery without angina pectoris: Secondary | ICD-10-CM | POA: Diagnosis not present

## 2020-05-23 DIAGNOSIS — G4700 Insomnia, unspecified: Secondary | ICD-10-CM | POA: Diagnosis not present

## 2020-05-23 DIAGNOSIS — Z951 Presence of aortocoronary bypass graft: Secondary | ICD-10-CM | POA: Diagnosis not present

## 2020-05-23 DIAGNOSIS — M25551 Pain in right hip: Secondary | ICD-10-CM | POA: Diagnosis not present

## 2020-05-23 DIAGNOSIS — M1990 Unspecified osteoarthritis, unspecified site: Secondary | ICD-10-CM | POA: Diagnosis not present

## 2020-05-23 DIAGNOSIS — Z043 Encounter for examination and observation following other accident: Secondary | ICD-10-CM | POA: Diagnosis not present

## 2020-05-23 DIAGNOSIS — F32A Depression, unspecified: Secondary | ICD-10-CM | POA: Diagnosis not present

## 2020-05-23 DIAGNOSIS — S5011XA Contusion of right forearm, initial encounter: Secondary | ICD-10-CM | POA: Diagnosis not present

## 2020-05-23 DIAGNOSIS — N289 Disorder of kidney and ureter, unspecified: Secondary | ICD-10-CM | POA: Diagnosis not present

## 2020-05-23 DIAGNOSIS — N183 Chronic kidney disease, stage 3 unspecified: Secondary | ICD-10-CM | POA: Diagnosis not present

## 2020-05-23 DIAGNOSIS — I5032 Chronic diastolic (congestive) heart failure: Secondary | ICD-10-CM | POA: Diagnosis not present

## 2020-05-23 DIAGNOSIS — I130 Hypertensive heart and chronic kidney disease with heart failure and stage 1 through stage 4 chronic kidney disease, or unspecified chronic kidney disease: Secondary | ICD-10-CM | POA: Diagnosis not present

## 2020-05-23 DIAGNOSIS — Z66 Do not resuscitate: Secondary | ICD-10-CM | POA: Diagnosis not present

## 2020-05-23 DIAGNOSIS — S52611A Displaced fracture of right ulna styloid process, initial encounter for closed fracture: Secondary | ICD-10-CM | POA: Diagnosis not present

## 2020-05-23 DIAGNOSIS — S52601A Unspecified fracture of lower end of right ulna, initial encounter for closed fracture: Secondary | ICD-10-CM | POA: Diagnosis not present

## 2020-05-23 DIAGNOSIS — K529 Noninfective gastroenteritis and colitis, unspecified: Secondary | ICD-10-CM | POA: Diagnosis not present

## 2020-05-23 DIAGNOSIS — Z20822 Contact with and (suspected) exposure to covid-19: Secondary | ICD-10-CM | POA: Diagnosis not present

## 2020-05-23 DIAGNOSIS — S72141A Displaced intertrochanteric fracture of right femur, initial encounter for closed fracture: Secondary | ICD-10-CM | POA: Diagnosis not present

## 2020-05-23 DIAGNOSIS — Z7901 Long term (current) use of anticoagulants: Secondary | ICD-10-CM | POA: Diagnosis not present

## 2020-05-23 DIAGNOSIS — R791 Abnormal coagulation profile: Secondary | ICD-10-CM | POA: Diagnosis not present

## 2020-05-23 DIAGNOSIS — F0390 Unspecified dementia without behavioral disturbance: Secondary | ICD-10-CM | POA: Diagnosis not present

## 2020-05-23 DIAGNOSIS — E785 Hyperlipidemia, unspecified: Secondary | ICD-10-CM | POA: Diagnosis not present

## 2020-05-23 DIAGNOSIS — S52591A Other fractures of lower end of right radius, initial encounter for closed fracture: Secondary | ICD-10-CM | POA: Diagnosis not present

## 2020-05-24 DIAGNOSIS — Z952 Presence of prosthetic heart valve: Secondary | ICD-10-CM | POA: Diagnosis not present

## 2020-05-24 DIAGNOSIS — S52611A Displaced fracture of right ulna styloid process, initial encounter for closed fracture: Secondary | ICD-10-CM | POA: Diagnosis not present

## 2020-05-24 DIAGNOSIS — I4891 Unspecified atrial fibrillation: Secondary | ICD-10-CM | POA: Diagnosis not present

## 2020-05-24 DIAGNOSIS — S52501A Unspecified fracture of the lower end of right radius, initial encounter for closed fracture: Secondary | ICD-10-CM | POA: Diagnosis not present

## 2020-05-24 DIAGNOSIS — S52591A Other fractures of lower end of right radius, initial encounter for closed fracture: Secondary | ICD-10-CM | POA: Diagnosis not present

## 2020-05-24 DIAGNOSIS — S72141A Displaced intertrochanteric fracture of right femur, initial encounter for closed fracture: Secondary | ICD-10-CM | POA: Diagnosis not present

## 2020-05-24 DIAGNOSIS — Z01810 Encounter for preprocedural cardiovascular examination: Secondary | ICD-10-CM | POA: Diagnosis not present

## 2020-05-24 DIAGNOSIS — Z951 Presence of aortocoronary bypass graft: Secondary | ICD-10-CM | POA: Diagnosis not present

## 2020-05-24 DIAGNOSIS — Z7901 Long term (current) use of anticoagulants: Secondary | ICD-10-CM | POA: Diagnosis not present

## 2020-05-25 DIAGNOSIS — S52611A Displaced fracture of right ulna styloid process, initial encounter for closed fracture: Secondary | ICD-10-CM | POA: Diagnosis not present

## 2020-05-25 DIAGNOSIS — I482 Chronic atrial fibrillation, unspecified: Secondary | ICD-10-CM | POA: Diagnosis not present

## 2020-05-25 DIAGNOSIS — S52591A Other fractures of lower end of right radius, initial encounter for closed fracture: Secondary | ICD-10-CM | POA: Diagnosis not present

## 2020-05-25 DIAGNOSIS — S72141A Displaced intertrochanteric fracture of right femur, initial encounter for closed fracture: Secondary | ICD-10-CM | POA: Diagnosis not present

## 2020-05-25 DIAGNOSIS — S72001A Fracture of unspecified part of neck of right femur, initial encounter for closed fracture: Secondary | ICD-10-CM | POA: Diagnosis not present

## 2020-05-26 DIAGNOSIS — S52611A Displaced fracture of right ulna styloid process, initial encounter for closed fracture: Secondary | ICD-10-CM | POA: Diagnosis not present

## 2020-05-26 DIAGNOSIS — S52591A Other fractures of lower end of right radius, initial encounter for closed fracture: Secondary | ICD-10-CM | POA: Diagnosis not present

## 2020-05-26 DIAGNOSIS — I482 Chronic atrial fibrillation, unspecified: Secondary | ICD-10-CM | POA: Diagnosis not present

## 2020-05-26 DIAGNOSIS — S72141A Displaced intertrochanteric fracture of right femur, initial encounter for closed fracture: Secondary | ICD-10-CM | POA: Diagnosis not present

## 2020-05-27 DIAGNOSIS — Z86711 Personal history of pulmonary embolism: Secondary | ICD-10-CM | POA: Insufficient documentation

## 2020-05-27 DIAGNOSIS — S72141A Displaced intertrochanteric fracture of right femur, initial encounter for closed fracture: Secondary | ICD-10-CM | POA: Diagnosis not present

## 2020-05-27 DIAGNOSIS — S52591A Other fractures of lower end of right radius, initial encounter for closed fracture: Secondary | ICD-10-CM | POA: Diagnosis not present

## 2020-05-27 DIAGNOSIS — M1990 Unspecified osteoarthritis, unspecified site: Secondary | ICD-10-CM | POA: Insufficient documentation

## 2020-05-27 DIAGNOSIS — S52611A Displaced fracture of right ulna styloid process, initial encounter for closed fracture: Secondary | ICD-10-CM | POA: Diagnosis not present

## 2020-05-27 DIAGNOSIS — I482 Chronic atrial fibrillation, unspecified: Secondary | ICD-10-CM | POA: Diagnosis not present

## 2020-05-28 DIAGNOSIS — I482 Chronic atrial fibrillation, unspecified: Secondary | ICD-10-CM | POA: Diagnosis not present

## 2020-05-28 DIAGNOSIS — S52611A Displaced fracture of right ulna styloid process, initial encounter for closed fracture: Secondary | ICD-10-CM | POA: Diagnosis not present

## 2020-05-28 DIAGNOSIS — S72141A Displaced intertrochanteric fracture of right femur, initial encounter for closed fracture: Secondary | ICD-10-CM | POA: Diagnosis not present

## 2020-05-28 DIAGNOSIS — S52591A Other fractures of lower end of right radius, initial encounter for closed fracture: Secondary | ICD-10-CM | POA: Diagnosis not present

## 2020-05-29 ENCOUNTER — Telehealth: Payer: Self-pay | Admitting: Internal Medicine

## 2020-05-29 DIAGNOSIS — S52611A Displaced fracture of right ulna styloid process, initial encounter for closed fracture: Secondary | ICD-10-CM | POA: Diagnosis not present

## 2020-05-29 DIAGNOSIS — S52591A Other fractures of lower end of right radius, initial encounter for closed fracture: Secondary | ICD-10-CM | POA: Diagnosis not present

## 2020-05-29 DIAGNOSIS — Z7901 Long term (current) use of anticoagulants: Secondary | ICD-10-CM | POA: Diagnosis not present

## 2020-05-29 DIAGNOSIS — S72141A Displaced intertrochanteric fracture of right femur, initial encounter for closed fracture: Secondary | ICD-10-CM | POA: Diagnosis not present

## 2020-05-29 DIAGNOSIS — I482 Chronic atrial fibrillation, unspecified: Secondary | ICD-10-CM | POA: Diagnosis not present

## 2020-05-29 NOTE — Telephone Encounter (Signed)
Patients daughter calling, stating that the patient is currently at [facility] and she has a broken hip and the hospital wants to discharge her to a rehab facility, the patients daughter is wondering if there is a facility that Dr. Alain Marion recommends. [phone]

## 2020-05-30 DIAGNOSIS — I482 Chronic atrial fibrillation, unspecified: Secondary | ICD-10-CM | POA: Diagnosis not present

## 2020-05-30 DIAGNOSIS — S52591A Other fractures of lower end of right radius, initial encounter for closed fracture: Secondary | ICD-10-CM | POA: Diagnosis not present

## 2020-05-30 DIAGNOSIS — S52611A Displaced fracture of right ulna styloid process, initial encounter for closed fracture: Secondary | ICD-10-CM | POA: Diagnosis not present

## 2020-05-30 DIAGNOSIS — S52501A Unspecified fracture of the lower end of right radius, initial encounter for closed fracture: Secondary | ICD-10-CM | POA: Diagnosis not present

## 2020-05-30 DIAGNOSIS — Z7901 Long term (current) use of anticoagulants: Secondary | ICD-10-CM | POA: Diagnosis not present

## 2020-05-30 DIAGNOSIS — S72141A Displaced intertrochanteric fracture of right femur, initial encounter for closed fracture: Secondary | ICD-10-CM | POA: Diagnosis not present

## 2020-05-30 NOTE — Telephone Encounter (Signed)
I am sorry about your hip fracture.  I am not familiar with the facilities.  She will have to take 1 that is available due to [employer]. Thanks

## 2020-05-31 DIAGNOSIS — S52611A Displaced fracture of right ulna styloid process, initial encounter for closed fracture: Secondary | ICD-10-CM | POA: Diagnosis not present

## 2020-05-31 DIAGNOSIS — S72141A Displaced intertrochanteric fracture of right femur, initial encounter for closed fracture: Secondary | ICD-10-CM | POA: Diagnosis not present

## 2020-05-31 DIAGNOSIS — I482 Chronic atrial fibrillation, unspecified: Secondary | ICD-10-CM | POA: Diagnosis not present

## 2020-05-31 DIAGNOSIS — Z7901 Long term (current) use of anticoagulants: Secondary | ICD-10-CM | POA: Diagnosis not present

## 2020-05-31 DIAGNOSIS — S52591A Other fractures of lower end of right radius, initial encounter for closed fracture: Secondary | ICD-10-CM | POA: Diagnosis not present

## 2020-05-31 NOTE — Telephone Encounter (Signed)
Daughter calling and was told she needs to call her mothers PCP to start a petition for [facility] care for the patient and wanted to ask someone about that.

## 2020-05-31 NOTE — Telephone Encounter (Signed)
Called pt daughter there was no answer LMOM w/MD response../lmb 

## 2020-06-01 DIAGNOSIS — Z7901 Long term (current) use of anticoagulants: Secondary | ICD-10-CM | POA: Diagnosis not present

## 2020-06-01 DIAGNOSIS — I482 Chronic atrial fibrillation, unspecified: Secondary | ICD-10-CM | POA: Diagnosis not present

## 2020-06-01 DIAGNOSIS — K529 Noninfective gastroenteritis and colitis, unspecified: Secondary | ICD-10-CM | POA: Diagnosis not present

## 2020-06-01 DIAGNOSIS — S72141A Displaced intertrochanteric fracture of right femur, initial encounter for closed fracture: Secondary | ICD-10-CM | POA: Diagnosis not present

## 2020-06-01 DIAGNOSIS — W19XXXA Unspecified fall, initial encounter: Secondary | ICD-10-CM | POA: Diagnosis not present

## 2020-06-01 DIAGNOSIS — S52591A Other fractures of lower end of right radius, initial encounter for closed fracture: Secondary | ICD-10-CM | POA: Diagnosis not present

## 2020-06-01 DIAGNOSIS — S52611A Displaced fracture of right ulna styloid process, initial encounter for closed fracture: Secondary | ICD-10-CM | POA: Diagnosis not present

## 2020-06-01 DIAGNOSIS — N183 Chronic kidney disease, stage 3 unspecified: Secondary | ICD-10-CM | POA: Diagnosis not present

## 2020-06-01 NOTE — Telephone Encounter (Signed)
Called pt daughter verified msg.. She states mom is at [facility] now, but shes's going by EMS to rehab center " [facility]" to have rehab and will be d/c in the next 2-3 weeks. She states she is wanting MD to order personal care services.she states she can not take care of her mom by herself. Mom lives alone, and she has an household herself. Inform [name] once ,om is d/c from rehab they will tell her to make reha f/u w/PCP, and once she see Dr. Camila Li he can order home health services if she needs, but far as personal care services some insurance doesn't care for personal care. She can contact social services to get recommendations on what she can do. Pt has [place] Medicaid.Marland KitchenJohny Chess

## 2020-06-02 DIAGNOSIS — F411 Generalized anxiety disorder: Secondary | ICD-10-CM | POA: Diagnosis not present

## 2020-06-02 DIAGNOSIS — Z951 Presence of aortocoronary bypass graft: Secondary | ICD-10-CM | POA: Diagnosis not present

## 2020-06-02 DIAGNOSIS — Z471 Aftercare following joint replacement surgery: Secondary | ICD-10-CM | POA: Diagnosis not present

## 2020-06-02 DIAGNOSIS — Z7901 Long term (current) use of anticoagulants: Secondary | ICD-10-CM | POA: Diagnosis not present

## 2020-06-02 DIAGNOSIS — Z7982 Long term (current) use of aspirin: Secondary | ICD-10-CM | POA: Diagnosis not present

## 2020-06-02 DIAGNOSIS — Z86711 Personal history of pulmonary embolism: Secondary | ICD-10-CM | POA: Diagnosis not present

## 2020-06-02 DIAGNOSIS — I082 Rheumatic disorders of both aortic and tricuspid valves: Secondary | ICD-10-CM | POA: Diagnosis not present

## 2020-06-02 DIAGNOSIS — N1831 Chronic kidney disease, stage 3a: Secondary | ICD-10-CM | POA: Diagnosis not present

## 2020-06-02 DIAGNOSIS — R0989 Other specified symptoms and signs involving the circulatory and respiratory systems: Secondary | ICD-10-CM | POA: Diagnosis not present

## 2020-06-02 DIAGNOSIS — Z8673 Personal history of transient ischemic attack (TIA), and cerebral infarction without residual deficits: Secondary | ICD-10-CM | POA: Diagnosis not present

## 2020-06-02 DIAGNOSIS — Z950 Presence of cardiac pacemaker: Secondary | ICD-10-CM | POA: Diagnosis not present

## 2020-06-02 DIAGNOSIS — I10 Essential (primary) hypertension: Secondary | ICD-10-CM | POA: Diagnosis not present

## 2020-06-02 DIAGNOSIS — D631 Anemia in chronic kidney disease: Secondary | ICD-10-CM | POA: Diagnosis not present

## 2020-06-02 DIAGNOSIS — S72002A Fracture of unspecified part of neck of left femur, initial encounter for closed fracture: Secondary | ICD-10-CM | POA: Diagnosis not present

## 2020-06-02 DIAGNOSIS — R52 Pain, unspecified: Secondary | ICD-10-CM | POA: Diagnosis not present

## 2020-06-02 DIAGNOSIS — I517 Cardiomegaly: Secondary | ICD-10-CM | POA: Diagnosis not present

## 2020-06-02 DIAGNOSIS — Q858 Other phakomatoses, not elsewhere classified: Secondary | ICD-10-CM | POA: Diagnosis not present

## 2020-06-02 DIAGNOSIS — I482 Chronic atrial fibrillation, unspecified: Secondary | ICD-10-CM | POA: Diagnosis not present

## 2020-06-02 DIAGNOSIS — Z043 Encounter for examination and observation following other accident: Secondary | ICD-10-CM | POA: Diagnosis not present

## 2020-06-02 DIAGNOSIS — F0390 Unspecified dementia without behavioral disturbance: Secondary | ICD-10-CM | POA: Diagnosis not present

## 2020-06-02 DIAGNOSIS — I5033 Acute on chronic diastolic (congestive) heart failure: Secondary | ICD-10-CM | POA: Diagnosis not present

## 2020-06-02 DIAGNOSIS — N1832 Chronic kidney disease, stage 3b: Secondary | ICD-10-CM | POA: Diagnosis not present

## 2020-06-02 DIAGNOSIS — Z8781 Personal history of (healed) traumatic fracture: Secondary | ICD-10-CM | POA: Insufficient documentation

## 2020-06-02 DIAGNOSIS — S72142A Displaced intertrochanteric fracture of left femur, initial encounter for closed fracture: Secondary | ICD-10-CM | POA: Diagnosis not present

## 2020-06-02 DIAGNOSIS — E785 Hyperlipidemia, unspecified: Secondary | ICD-10-CM | POA: Diagnosis not present

## 2020-06-02 DIAGNOSIS — I2510 Atherosclerotic heart disease of native coronary artery without angina pectoris: Secondary | ICD-10-CM | POA: Diagnosis not present

## 2020-06-02 DIAGNOSIS — Z953 Presence of xenogenic heart valve: Secondary | ICD-10-CM | POA: Diagnosis not present

## 2020-06-02 DIAGNOSIS — W010XXA Fall on same level from slipping, tripping and stumbling without subsequent striking against object, initial encounter: Secondary | ICD-10-CM | POA: Diagnosis not present

## 2020-06-02 DIAGNOSIS — Z96642 Presence of left artificial hip joint: Secondary | ICD-10-CM | POA: Diagnosis not present

## 2020-06-02 DIAGNOSIS — K219 Gastro-esophageal reflux disease without esophagitis: Secondary | ICD-10-CM | POA: Diagnosis not present

## 2020-06-02 DIAGNOSIS — Z952 Presence of prosthetic heart valve: Secondary | ICD-10-CM | POA: Diagnosis not present

## 2020-06-02 DIAGNOSIS — I4891 Unspecified atrial fibrillation: Secondary | ICD-10-CM | POA: Diagnosis not present

## 2020-06-02 DIAGNOSIS — S52592D Other fractures of lower end of left radius, subsequent encounter for closed fracture with routine healing: Secondary | ICD-10-CM | POA: Diagnosis not present

## 2020-06-02 DIAGNOSIS — R339 Retention of urine, unspecified: Secondary | ICD-10-CM | POA: Diagnosis not present

## 2020-06-02 DIAGNOSIS — I130 Hypertensive heart and chronic kidney disease with heart failure and stage 1 through stage 4 chronic kidney disease, or unspecified chronic kidney disease: Secondary | ICD-10-CM | POA: Diagnosis not present

## 2020-06-03 DIAGNOSIS — Z952 Presence of prosthetic heart valve: Secondary | ICD-10-CM | POA: Diagnosis not present

## 2020-06-03 DIAGNOSIS — F0390 Unspecified dementia without behavioral disturbance: Secondary | ICD-10-CM | POA: Diagnosis not present

## 2020-06-03 DIAGNOSIS — D631 Anemia in chronic kidney disease: Secondary | ICD-10-CM | POA: Insufficient documentation

## 2020-06-03 DIAGNOSIS — I482 Chronic atrial fibrillation, unspecified: Secondary | ICD-10-CM | POA: Diagnosis not present

## 2020-06-03 DIAGNOSIS — Z951 Presence of aortocoronary bypass graft: Secondary | ICD-10-CM | POA: Diagnosis not present

## 2020-06-03 DIAGNOSIS — N1832 Chronic kidney disease, stage 3b: Secondary | ICD-10-CM | POA: Diagnosis not present

## 2020-06-03 DIAGNOSIS — Z7901 Long term (current) use of anticoagulants: Secondary | ICD-10-CM | POA: Diagnosis not present

## 2020-06-03 DIAGNOSIS — Z9889 Other specified postprocedural states: Secondary | ICD-10-CM | POA: Diagnosis not present

## 2020-06-03 DIAGNOSIS — S72002A Fracture of unspecified part of neck of left femur, initial encounter for closed fracture: Secondary | ICD-10-CM | POA: Diagnosis not present

## 2020-06-04 DIAGNOSIS — N1832 Chronic kidney disease, stage 3b: Secondary | ICD-10-CM | POA: Diagnosis not present

## 2020-06-04 DIAGNOSIS — S72002A Fracture of unspecified part of neck of left femur, initial encounter for closed fracture: Secondary | ICD-10-CM | POA: Diagnosis not present

## 2020-06-04 DIAGNOSIS — D631 Anemia in chronic kidney disease: Secondary | ICD-10-CM | POA: Diagnosis not present

## 2020-06-04 DIAGNOSIS — M549 Dorsalgia, unspecified: Secondary | ICD-10-CM | POA: Diagnosis not present

## 2020-06-04 DIAGNOSIS — Z9889 Other specified postprocedural states: Secondary | ICD-10-CM | POA: Diagnosis not present

## 2020-06-04 DIAGNOSIS — Z951 Presence of aortocoronary bypass graft: Secondary | ICD-10-CM | POA: Diagnosis not present

## 2020-06-04 DIAGNOSIS — Z7901 Long term (current) use of anticoagulants: Secondary | ICD-10-CM | POA: Diagnosis not present

## 2020-06-04 DIAGNOSIS — F0390 Unspecified dementia without behavioral disturbance: Secondary | ICD-10-CM | POA: Diagnosis not present

## 2020-06-04 DIAGNOSIS — Z952 Presence of prosthetic heart valve: Secondary | ICD-10-CM | POA: Diagnosis not present

## 2020-06-04 DIAGNOSIS — I482 Chronic atrial fibrillation, unspecified: Secondary | ICD-10-CM | POA: Diagnosis not present

## 2020-06-05 DIAGNOSIS — Z951 Presence of aortocoronary bypass graft: Secondary | ICD-10-CM | POA: Diagnosis not present

## 2020-06-05 DIAGNOSIS — D631 Anemia in chronic kidney disease: Secondary | ICD-10-CM | POA: Diagnosis not present

## 2020-06-05 DIAGNOSIS — S52501A Unspecified fracture of the lower end of right radius, initial encounter for closed fracture: Secondary | ICD-10-CM | POA: Diagnosis not present

## 2020-06-05 DIAGNOSIS — S72002A Fracture of unspecified part of neck of left femur, initial encounter for closed fracture: Secondary | ICD-10-CM | POA: Diagnosis not present

## 2020-06-05 DIAGNOSIS — N1832 Chronic kidney disease, stage 3b: Secondary | ICD-10-CM | POA: Diagnosis not present

## 2020-06-05 DIAGNOSIS — I482 Chronic atrial fibrillation, unspecified: Secondary | ICD-10-CM | POA: Diagnosis not present

## 2020-06-05 DIAGNOSIS — Z952 Presence of prosthetic heart valve: Secondary | ICD-10-CM | POA: Diagnosis not present

## 2020-06-05 DIAGNOSIS — Z9889 Other specified postprocedural states: Secondary | ICD-10-CM | POA: Diagnosis not present

## 2020-06-05 DIAGNOSIS — F0390 Unspecified dementia without behavioral disturbance: Secondary | ICD-10-CM | POA: Diagnosis not present

## 2020-06-05 DIAGNOSIS — Z7901 Long term (current) use of anticoagulants: Secondary | ICD-10-CM | POA: Diagnosis not present

## 2020-06-06 DIAGNOSIS — I482 Chronic atrial fibrillation, unspecified: Secondary | ICD-10-CM | POA: Diagnosis not present

## 2020-06-06 DIAGNOSIS — D631 Anemia in chronic kidney disease: Secondary | ICD-10-CM | POA: Diagnosis not present

## 2020-06-06 DIAGNOSIS — N1832 Chronic kidney disease, stage 3b: Secondary | ICD-10-CM | POA: Diagnosis not present

## 2020-06-06 DIAGNOSIS — Z952 Presence of prosthetic heart valve: Secondary | ICD-10-CM | POA: Diagnosis not present

## 2020-06-06 DIAGNOSIS — S72002A Fracture of unspecified part of neck of left femur, initial encounter for closed fracture: Secondary | ICD-10-CM | POA: Diagnosis not present

## 2020-06-06 DIAGNOSIS — Z951 Presence of aortocoronary bypass graft: Secondary | ICD-10-CM | POA: Diagnosis not present

## 2020-06-06 DIAGNOSIS — F0390 Unspecified dementia without behavioral disturbance: Secondary | ICD-10-CM | POA: Diagnosis not present

## 2020-06-06 DIAGNOSIS — Z9889 Other specified postprocedural states: Secondary | ICD-10-CM | POA: Diagnosis not present

## 2020-06-06 DIAGNOSIS — Z7901 Long term (current) use of anticoagulants: Secondary | ICD-10-CM | POA: Diagnosis not present

## 2020-06-07 DIAGNOSIS — Z952 Presence of prosthetic heart valve: Secondary | ICD-10-CM | POA: Diagnosis not present

## 2020-06-07 DIAGNOSIS — Z951 Presence of aortocoronary bypass graft: Secondary | ICD-10-CM | POA: Diagnosis not present

## 2020-06-07 DIAGNOSIS — Z7901 Long term (current) use of anticoagulants: Secondary | ICD-10-CM | POA: Diagnosis not present

## 2020-06-07 DIAGNOSIS — F419 Anxiety disorder, unspecified: Secondary | ICD-10-CM | POA: Diagnosis not present

## 2020-06-07 DIAGNOSIS — F0390 Unspecified dementia without behavioral disturbance: Secondary | ICD-10-CM | POA: Diagnosis not present

## 2020-06-07 DIAGNOSIS — F32A Depression, unspecified: Secondary | ICD-10-CM | POA: Diagnosis not present

## 2020-06-07 DIAGNOSIS — N1831 Chronic kidney disease, stage 3a: Secondary | ICD-10-CM | POA: Diagnosis not present

## 2020-06-07 DIAGNOSIS — I10 Essential (primary) hypertension: Secondary | ICD-10-CM | POA: Diagnosis not present

## 2020-06-07 DIAGNOSIS — N1832 Chronic kidney disease, stage 3b: Secondary | ICD-10-CM | POA: Diagnosis not present

## 2020-06-07 DIAGNOSIS — I2510 Atherosclerotic heart disease of native coronary artery without angina pectoris: Secondary | ICD-10-CM | POA: Diagnosis not present

## 2020-06-07 DIAGNOSIS — I272 Pulmonary hypertension, unspecified: Secondary | ICD-10-CM | POA: Diagnosis not present

## 2020-06-07 DIAGNOSIS — Z8781 Personal history of (healed) traumatic fracture: Secondary | ICD-10-CM | POA: Insufficient documentation

## 2020-06-07 DIAGNOSIS — D631 Anemia in chronic kidney disease: Secondary | ICD-10-CM | POA: Diagnosis not present

## 2020-06-07 DIAGNOSIS — I482 Chronic atrial fibrillation, unspecified: Secondary | ICD-10-CM | POA: Diagnosis not present

## 2020-06-07 DIAGNOSIS — S52571A Other intraarticular fracture of lower end of right radius, initial encounter for closed fracture: Secondary | ICD-10-CM | POA: Diagnosis not present

## 2020-06-07 DIAGNOSIS — I5032 Chronic diastolic (congestive) heart failure: Secondary | ICD-10-CM | POA: Diagnosis not present

## 2020-06-07 DIAGNOSIS — K219 Gastro-esophageal reflux disease without esophagitis: Secondary | ICD-10-CM | POA: Diagnosis not present

## 2020-06-07 DIAGNOSIS — Z9889 Other specified postprocedural states: Secondary | ICD-10-CM | POA: Diagnosis not present

## 2020-06-07 DIAGNOSIS — S72002A Fracture of unspecified part of neck of left femur, initial encounter for closed fracture: Secondary | ICD-10-CM | POA: Diagnosis not present

## 2020-06-08 DIAGNOSIS — I482 Chronic atrial fibrillation, unspecified: Secondary | ICD-10-CM | POA: Diagnosis not present

## 2020-06-08 DIAGNOSIS — Z9889 Other specified postprocedural states: Secondary | ICD-10-CM | POA: Diagnosis not present

## 2020-06-08 DIAGNOSIS — Z7901 Long term (current) use of anticoagulants: Secondary | ICD-10-CM | POA: Diagnosis not present

## 2020-06-08 DIAGNOSIS — N1832 Chronic kidney disease, stage 3b: Secondary | ICD-10-CM | POA: Diagnosis not present

## 2020-06-08 DIAGNOSIS — Z951 Presence of aortocoronary bypass graft: Secondary | ICD-10-CM | POA: Diagnosis not present

## 2020-06-08 DIAGNOSIS — D631 Anemia in chronic kidney disease: Secondary | ICD-10-CM | POA: Diagnosis not present

## 2020-06-08 DIAGNOSIS — F0390 Unspecified dementia without behavioral disturbance: Secondary | ICD-10-CM | POA: Diagnosis not present

## 2020-06-08 DIAGNOSIS — Z952 Presence of prosthetic heart valve: Secondary | ICD-10-CM | POA: Diagnosis not present

## 2020-06-08 DIAGNOSIS — S72002A Fracture of unspecified part of neck of left femur, initial encounter for closed fracture: Secondary | ICD-10-CM | POA: Diagnosis not present

## 2020-06-09 DIAGNOSIS — Z7901 Long term (current) use of anticoagulants: Secondary | ICD-10-CM | POA: Diagnosis not present

## 2020-06-09 DIAGNOSIS — I482 Chronic atrial fibrillation, unspecified: Secondary | ICD-10-CM | POA: Diagnosis not present

## 2020-06-09 DIAGNOSIS — Z9889 Other specified postprocedural states: Secondary | ICD-10-CM | POA: Diagnosis not present

## 2020-06-09 DIAGNOSIS — N1832 Chronic kidney disease, stage 3b: Secondary | ICD-10-CM | POA: Diagnosis not present

## 2020-06-09 DIAGNOSIS — S72002A Fracture of unspecified part of neck of left femur, initial encounter for closed fracture: Secondary | ICD-10-CM | POA: Diagnosis not present

## 2020-06-09 DIAGNOSIS — Z951 Presence of aortocoronary bypass graft: Secondary | ICD-10-CM | POA: Diagnosis not present

## 2020-06-09 DIAGNOSIS — Z952 Presence of prosthetic heart valve: Secondary | ICD-10-CM | POA: Diagnosis not present

## 2020-06-09 DIAGNOSIS — D631 Anemia in chronic kidney disease: Secondary | ICD-10-CM | POA: Diagnosis not present

## 2020-06-09 DIAGNOSIS — F0390 Unspecified dementia without behavioral disturbance: Secondary | ICD-10-CM | POA: Diagnosis not present

## 2020-06-10 DIAGNOSIS — Z7901 Long term (current) use of anticoagulants: Secondary | ICD-10-CM | POA: Diagnosis not present

## 2020-06-10 DIAGNOSIS — Z951 Presence of aortocoronary bypass graft: Secondary | ICD-10-CM | POA: Diagnosis not present

## 2020-06-10 DIAGNOSIS — N1832 Chronic kidney disease, stage 3b: Secondary | ICD-10-CM | POA: Diagnosis not present

## 2020-06-10 DIAGNOSIS — Z9889 Other specified postprocedural states: Secondary | ICD-10-CM | POA: Diagnosis not present

## 2020-06-10 DIAGNOSIS — D631 Anemia in chronic kidney disease: Secondary | ICD-10-CM | POA: Diagnosis not present

## 2020-06-10 DIAGNOSIS — I482 Chronic atrial fibrillation, unspecified: Secondary | ICD-10-CM | POA: Diagnosis not present

## 2020-06-10 DIAGNOSIS — F0390 Unspecified dementia without behavioral disturbance: Secondary | ICD-10-CM | POA: Diagnosis not present

## 2020-06-10 DIAGNOSIS — S72002A Fracture of unspecified part of neck of left femur, initial encounter for closed fracture: Secondary | ICD-10-CM | POA: Diagnosis not present

## 2020-06-10 DIAGNOSIS — Z952 Presence of prosthetic heart valve: Secondary | ICD-10-CM | POA: Diagnosis not present

## 2020-06-11 DIAGNOSIS — Z9889 Other specified postprocedural states: Secondary | ICD-10-CM | POA: Diagnosis not present

## 2020-06-11 DIAGNOSIS — S72002A Fracture of unspecified part of neck of left femur, initial encounter for closed fracture: Secondary | ICD-10-CM | POA: Diagnosis not present

## 2020-06-11 DIAGNOSIS — N1832 Chronic kidney disease, stage 3b: Secondary | ICD-10-CM | POA: Diagnosis not present

## 2020-06-11 DIAGNOSIS — Z7901 Long term (current) use of anticoagulants: Secondary | ICD-10-CM | POA: Diagnosis not present

## 2020-06-11 DIAGNOSIS — R4182 Altered mental status, unspecified: Secondary | ICD-10-CM | POA: Diagnosis not present

## 2020-06-11 DIAGNOSIS — I482 Chronic atrial fibrillation, unspecified: Secondary | ICD-10-CM | POA: Diagnosis not present

## 2020-06-11 DIAGNOSIS — F0390 Unspecified dementia without behavioral disturbance: Secondary | ICD-10-CM | POA: Diagnosis not present

## 2020-06-11 DIAGNOSIS — Z951 Presence of aortocoronary bypass graft: Secondary | ICD-10-CM | POA: Diagnosis not present

## 2020-06-11 DIAGNOSIS — Z952 Presence of prosthetic heart valve: Secondary | ICD-10-CM | POA: Diagnosis not present

## 2020-06-11 DIAGNOSIS — D631 Anemia in chronic kidney disease: Secondary | ICD-10-CM | POA: Diagnosis not present

## 2020-06-12 DIAGNOSIS — I482 Chronic atrial fibrillation, unspecified: Secondary | ICD-10-CM | POA: Diagnosis not present

## 2020-06-12 DIAGNOSIS — D631 Anemia in chronic kidney disease: Secondary | ICD-10-CM | POA: Diagnosis not present

## 2020-06-12 DIAGNOSIS — Z7901 Long term (current) use of anticoagulants: Secondary | ICD-10-CM | POA: Diagnosis not present

## 2020-06-12 DIAGNOSIS — Z9889 Other specified postprocedural states: Secondary | ICD-10-CM | POA: Diagnosis not present

## 2020-06-12 DIAGNOSIS — Z952 Presence of prosthetic heart valve: Secondary | ICD-10-CM | POA: Diagnosis not present

## 2020-06-12 DIAGNOSIS — N1832 Chronic kidney disease, stage 3b: Secondary | ICD-10-CM | POA: Diagnosis not present

## 2020-06-12 DIAGNOSIS — S72002A Fracture of unspecified part of neck of left femur, initial encounter for closed fracture: Secondary | ICD-10-CM | POA: Diagnosis not present

## 2020-06-12 DIAGNOSIS — F0390 Unspecified dementia without behavioral disturbance: Secondary | ICD-10-CM | POA: Diagnosis not present

## 2020-06-12 DIAGNOSIS — Z951 Presence of aortocoronary bypass graft: Secondary | ICD-10-CM | POA: Diagnosis not present

## 2020-06-13 DIAGNOSIS — S72002A Fracture of unspecified part of neck of left femur, initial encounter for closed fracture: Secondary | ICD-10-CM | POA: Diagnosis not present

## 2020-06-13 DIAGNOSIS — M25561 Pain in right knee: Secondary | ICD-10-CM | POA: Diagnosis not present

## 2020-06-13 DIAGNOSIS — M1711 Unilateral primary osteoarthritis, right knee: Secondary | ICD-10-CM | POA: Diagnosis not present

## 2020-06-13 DIAGNOSIS — F0390 Unspecified dementia without behavioral disturbance: Secondary | ICD-10-CM | POA: Diagnosis not present

## 2020-06-13 DIAGNOSIS — I482 Chronic atrial fibrillation, unspecified: Secondary | ICD-10-CM | POA: Diagnosis not present

## 2020-06-13 DIAGNOSIS — F05 Delirium due to known physiological condition: Secondary | ICD-10-CM | POA: Diagnosis not present

## 2020-06-13 DIAGNOSIS — N183 Chronic kidney disease, stage 3 unspecified: Secondary | ICD-10-CM | POA: Diagnosis not present

## 2020-06-16 DIAGNOSIS — I482 Chronic atrial fibrillation, unspecified: Secondary | ICD-10-CM | POA: Diagnosis not present

## 2020-06-16 DIAGNOSIS — F05 Delirium due to known physiological condition: Secondary | ICD-10-CM | POA: Diagnosis not present

## 2020-06-16 DIAGNOSIS — N1832 Chronic kidney disease, stage 3b: Secondary | ICD-10-CM | POA: Diagnosis not present

## 2020-06-16 DIAGNOSIS — F0390 Unspecified dementia without behavioral disturbance: Secondary | ICD-10-CM | POA: Diagnosis not present

## 2020-06-16 DIAGNOSIS — N183 Chronic kidney disease, stage 3 unspecified: Secondary | ICD-10-CM | POA: Diagnosis not present

## 2020-06-16 DIAGNOSIS — S7292XD Unspecified fracture of left femur, subsequent encounter for closed fracture with routine healing: Secondary | ICD-10-CM | POA: Diagnosis not present

## 2020-06-16 DIAGNOSIS — S72002A Fracture of unspecified part of neck of left femur, initial encounter for closed fracture: Secondary | ICD-10-CM | POA: Diagnosis not present

## 2020-06-17 DIAGNOSIS — R079 Chest pain, unspecified: Secondary | ICD-10-CM | POA: Diagnosis not present

## 2020-06-19 ENCOUNTER — Telehealth: Payer: Self-pay | Admitting: Internal Medicine

## 2020-06-19 NOTE — Telephone Encounter (Signed)
   Debra a physcial therapist from [facility] calling requesting verbal orders for 1 week 1, 2 week 4, 1 week 4. Would also like to request a nursing evaluation for cardio pulmonary and a medical social work consult.  Hilda [phone]

## 2020-06-20 NOTE — Telephone Encounter (Signed)
Called Hilda Blades there was no answer LMOM w/MD response.Marland KitchenJohny Chess

## 2020-06-20 NOTE — Telephone Encounter (Signed)
Okay. Thank you.

## 2020-06-22 ENCOUNTER — Telehealth (INDEPENDENT_AMBULATORY_CARE_PROVIDER_SITE_OTHER): Payer: Medicaid Other | Admitting: Internal Medicine

## 2020-06-22 ENCOUNTER — Telehealth: Payer: Self-pay | Admitting: Internal Medicine

## 2020-06-22 DIAGNOSIS — I509 Heart failure, unspecified: Secondary | ICD-10-CM

## 2020-06-22 DIAGNOSIS — G301 Alzheimer's disease with late onset: Secondary | ICD-10-CM | POA: Diagnosis not present

## 2020-06-22 DIAGNOSIS — I4821 Permanent atrial fibrillation: Secondary | ICD-10-CM | POA: Diagnosis not present

## 2020-06-22 DIAGNOSIS — I2510 Atherosclerotic heart disease of native coronary artery without angina pectoris: Secondary | ICD-10-CM

## 2020-06-22 DIAGNOSIS — F4323 Adjustment disorder with mixed anxiety and depressed mood: Secondary | ICD-10-CM | POA: Diagnosis not present

## 2020-06-22 DIAGNOSIS — R413 Other amnesia: Secondary | ICD-10-CM | POA: Diagnosis not present

## 2020-06-22 DIAGNOSIS — Z7901 Long term (current) use of anticoagulants: Secondary | ICD-10-CM | POA: Diagnosis not present

## 2020-06-22 DIAGNOSIS — F0280 Dementia in other diseases classified elsewhere without behavioral disturbance: Secondary | ICD-10-CM | POA: Diagnosis not present

## 2020-06-22 DIAGNOSIS — M25559 Pain in unspecified hip: Secondary | ICD-10-CM

## 2020-06-22 MED ORDER — METHOCARBAMOL 500 MG PO TABS: 500.0000 mg | ORAL_TABLET | Freq: Three times a day (TID) | ORAL | 1 refills | Status: DC | PRN

## 2020-06-22 MED ORDER — OLANZAPINE 5 MG PO TABS: ORAL_TABLET | ORAL | 3 refills | Status: DC

## 2020-06-22 NOTE — Progress Notes (Signed)
Virtual Visit via Video Note  I connected with Alexis [name] on 06/22/20 at  2:40 PM EST by a video enabled telemedicine application and verified that I am speaking with the correct person using two identifiers.   I discussed the limitations of evaluation and management by telemedicine and the availability of in person appointments. The patient expressed understanding and agreed to proceed.  I was located at our [facility] office. The patient was at home. There was her daughter Alexis [name] present in the visit.   History of Present Illness: We need to follow-up on multiple issues.  The patient fell on December 28 and then sustained a right hand fracture and right hip fracture; she was treated at the [facility] and subsequently discharged to the rehab facility.  At the rehab facility she fell and broke her left hip on January 6.  She was taken to [facility] for treatment.  She was confused and agitated while there and treated with Zyprexa.  She is complaining of pain in the left hip, unable to sleep.  Her dementia seems to be worse.  The history was obtained from the patient and her daughter  There has been no runny nose, cough, chest pain, abdominal pain, diarrhea, constipation, arthralgias, skin rashes.   Date of Admission: 05/23/2020  Date of Discharge: 06/01/2020  Admitting Attending: Karren Cobble, MD   Discharging Attending: Tommye Standard, [place]*  Diagnoses:  Principal Problem (Resolved): Fall at home, initial encounter Active Problems: Closed fracture of right distal radius and ulna Fall Closed displaced intertrochanteric fracture of right femur (HCC) Chronic atrial fibrillation (HCC) Arthritis Chronic renal insufficiency, stage 3 (moderate) (HCC) Congestive heart failure, diastolic, left, w/preserved LV function, NYHA class 4 (HCC) Coronary atherosclerosis Cerebrovascular disease Gastroenteritis Hx of pulmonary  embolus Resolved Problems: Displaced intertrochanteric fracture of right femur ([place]) Warfarin anticoagulation  Hospital Course: Alexis [name] is a 87 y.o. female with PMH significant for CVA, CAD s/p CABG in 2009, bioprosthetic mitral and aortic valve replacement, left atrial appendage ligation, HFpEF, PE in January 2021 (while off anticoagulation), PAD with R heel ulcer, A fib and dementia who presented on 12/28 for evaluation after a mechanical fall at home.   In the ED patient was afebrile, hemodynamically stable and without hypoxia. Labs notable for INR 3.48 and Cr 1.29 (baseline) with BUN 34 and ALP 125. Trauma work up showed CT head, CT C spine and CXR w/o acute abnormalities. R wrist XR showed acute comminuted fx of the distal radius extending to the articular surface with associated dorsal angulation; acute displaced ulnar styloid fx and irregularity to the proximal ulnar margin of the lunate concerning for acute impaction fx. R hip/thigh/femur XR showed acute intertrochanteric R proximal femoral fx with varus angulation. She was admitted to [facility] with Orthopedics and Cardiology consults.   Patient was admitted for right hip and right wrist fractures. She was evaluated by Orthopedics who recommended surgical intervention however patient had supratherapeutic INR on admission requiring Vitamin K to decrease to safe range for surgery. Cardiology evaluated patient who agreed while patient is at increased cardiac risk, benefit of surgery outweighed risk. Patient was taken to the OR on 05/25/20 by Dr. Forestine Na for ORIF of her intertrochanteric fracture of the right hip and closed reduction of her right wrist fractures. She had an uncomplicated peri operative and post operative course. Post op, her Warfarin was bridged initially with Heparin gtt then with SQ Lovenox. By discharge her INR is >  2 so patient will be discharged on her home Warfarin schedule. Recommend repeat INR 3-4 days  after discharge. Patient's wrist fracture was initially placed in a splint then transitioned to a hard cast. Unfortunately patient removed cast so she was again placed in a splint so this could be easily replaced if removed. She will continue scheduled Tylenol, Lidocaine patch and PRN Norco at discharge for pain control.   Uterine fibroid incidentally found on pelvis XR. Recommend outpatient follow up as indicated by PCP.   Patient chronic conditions of HFpEF, CAD s/p CABG, CAD stage III, depression and demintia were addressed during hospitalization and management has not changed. Patient will follow up with Dr. Forestine Na in 2 weeks then other outpatient providers as scheduled. Discharge plan was discussed with patient and daughter at bedside, questions were answered. Pharmacy reviewed medications.   A copy of this note will be sent to PCP on file with [place] (if any) to to ensure follow up and continuity of care.  Physical Exam today: Vitals:  06/01/20 0714 06/01/20 0805 06/01/20 0905 06/01/20 1037  BP: 128/70  Pulse: 76 100  Temp: 97.8 F (36.6 C)  Resp: 18 16  SpO2: 96% 98%  PainSc: 8-Eight (severe) 0-Zero  Height:  Weight:  BMI (Calculated):   Constitutional: NAD, pleasant and cooperative, lying in bed comfortably HEENT: Pupils equal, round. Conjunctiva clear. Mucosa moist.  CV: RRR no M,R,G. Pulses +2 LE. No peripheral pitting edema.  RESP: Clear to auscultation B/L, normal respiratory rate, no use of accessory muscles  GI: Normal bowel sounds in all four quadrants, soft, non-tender Neuro: Alert and oriented to self and hospital, no focal deficits Psych: Full affect, good mood, normal judgement, good insight Skin: Warm and dry. No ulcerations on exposed arms and legs. Aquacel dressing in place over right hip with sanguinous drainage present. MSK: Distal right arm in splint. Ecchymosis present on fingers but brisk cap refill and good range of motion.   Significant Diagnostic  Studies: Results for orders placed or performed during the hospital encounter of 05/23/20  Comprehensive Metabolic Panel  Result Value Ref Range  Sodium 139 136 - 144 MMOL/L  Potassium 3.7 3.5 - 5.1 MMOL/L  Chloride 107 101 - 111 MMOL/L  CO2 23 23 - 30 MMOL/L  Glucose 146 (H) 79 - 115 MG/DL  Creatinine 1.29 (H) 0.60 - 1.10 MG/DL  Calcium 8.8 (L) 8.9 - 10.3 MG/DL  Total Protein 7.7 6.5 - 8.1 G/DL  Albumin 3.3 G/DL  Total Bilirubin 0.5 0.3 - 1.2 MG/DL  Alkaline Phosphatase 125 (H) 32 - 91 IU/L or U/L  AST (SGOT) 27 15 - 41 IU/L or U/L  ALT (SGPT) 15 14 - 54 IU/L or U/L  Anion Gap 9 6 - 16 MMOL/L  BUN 34.0 (H) 8.0 - 26.0 MG/DL  Est. GFR 41 (L) >=60 ML/MIN/1.73 M*2  PT/PTT MUST Fill with blood to the clear etched line (minimum) near the cap. If you use a butterfly, must use a red top tube as a waste tube to get the air out of the butterfly line before using the blue top.  Result Value Ref Range  Prothrombin Time 40.7 (H) 9.4 - 12.5 SEC  INR 3.48 <5.00  aPTT 39.6 (H) 25.1 - 36.5 SEC  COVID-19 and Influenza A/B PCR  Specimen: Nasal; Nasopharyngeal Swab  Result Value Ref Range  SARS-COV-2 Negative Negative  Influenza A Negative Negative  Influenza B Negative Negative  COVID-19 FLUAB COMMENT  Results are for use in the simultaneous  rapid in vitro detection and differentiation of SARS-CoV-2, influenza A virus, and influenza B virus nucleic acids by PCR in clinical specimens.   Positive results are indicative of active infection but do not rule out bacterial infection or co-infection with other pathogens not detected by the test. Clinical correlation with patient history and other diagnostic information is necessary to determine patient infection status. The agent detected may not be the definite cause of disease.  Negative results do not preclude SARS-CoV-2, influenza A, and/or influenza B infection and should not be used as the sole basis for diagnosis, treatment or other patient  management decisions. Negative results must be combined with clinical observations, patient history, and/or epidemiological information.  Test performed by [facility] qualified personnel using the Roche cobas SARS-CoV-2 & Influenza A/B Nucleic acid test on the LIAT instrument. This test is only for use under the Food and Drug Administration's Emergency Use Authorization (EUA).   Assessment and plan 87 year-old female with a history of CVA, CAD s/p CABG in 2009, bioprosthetic mitral valve replacement, bioprosthetic aortic valve replacement, and left atrial appendage ligation, prosthetic mitral valve endocarditis in 2018, transcatheter mitral valve-in-valve replacement 05/2019 (for MR of bioprosthetic mitral valve), HFpEF, PE 05/2019 while off of anticoagulation, PAD with a right heel ulcer, atrial fibrillation, and dementia presented to the ED on 12/28 after a mechanical fall at home.  # Mechanical fall with Right hip fracture - Acute intertrochanteric right proximal femoral fracture with varus angulation. - Surgical risk is elevated due to hx of HFpEF, PAD.  - No apparent unstable cardiac, vascular, or pulmonary dx present that needs to be corrected prior to surgery.  - Cardiology consulted - patient is moderate risk, but this does not preclude surgical repair. - Surgery delayed due to supra-therapeutic INR  - Initially given vitamin K 5 mg p.o. repeat vitamin K IV 10 mg, INR down to 1.48.  - (12/30) Was taken to the OR for ORIF of the right Hip by Ortho - Pt is POD 2 s/p ORIF of the right hip fracture - PT/OT started - awaiting rehab placement - Tylenol, Norco for pain  # Right wrist fractures - Impacted comminuted fracture of the distal radius extending to the articular surfacte - Acute displaced ulnar styloid fracture - Possible acute impaction fracture of the lunate per x-ray - S/p closed reduction and splinting with orthopaedics in the ER - Further plan by Ortho.  # CKD III - Cr consistent  with baseline - Gentle IV fluids given  # Chronic Afib and PE - EKG with afib at 68bpm with no acute ischemic changes.  - Inferior q-waves present.  - Restarted warfarin with heparin gtt bridge to INR 2-3 - Continue Coumadin dosing by pharmacy  # HFpEF - No acute exacerbation - Hold Lasix (takes 40mg  BID usually) given expected reduced intake in hospital and recent low BP but monitor volume status closely. - Stop IVF, Start Midodrine  # CAD s/p CABG - No Chest pain - Restart ASA  # HLD - Continue Atorvastatin  # Depression with Insomnia # Dementia - Melatonin - Continue Lexapro, Remeron - No longer taking Aricept   # Suspected uterine fibroid - 7cm lobulated calcification in central pelvis on x-ray - Suspected as uterine fibroid - Outpatient follow-up  Diet- Low sodium diet DVT PPx - Already supra-therapeutic on warfarin-  CODE STATUS - DNR  Objective: Vitals:  05/28/20 0322 05/28/20 0650 05/28/20 1041 05/28/20 1108  BP: 122/64 119/63  Pulse: 89 90 80 80  Temp: 97.8 F (36.6 C) 97.8 F (36.6 C)  Resp: 18 16  SpO2: 98% 99%  PainSc: 0-Zero  Height:  Weight:  BMI (Calculated):   Intake/Output this shift:  Intake/Output Summary (Last 24 hours) at 05/28/2020 1438 Last data filed at 05/28/2020 0323 Gross per 24 hour  Intake -  Output 1100 ml  Net -1100 ml   Physical Exam:  Constitutional: No physical distress, pleasant and cooperative,  HEENT: Pupils equal, round. Conjunctiva clear. Mucosa moist.  CV: RRR no M,R,G. Pulses +2 LE. No peripheral pitting edema.  RESP: Clear to auscultation B/L, normal respiratory rate, no use of accessory muscles  GI: Normal bowel sounds in all four quadrants, soft, non-tender  Neuro: Alert and oriented x3, CN II - XII grossly intact. Sensation intact. Psych: Full affect, good mood, normal judgement, good insight  Skin: Warm and dry. No ulcerations on exposed arms and legs. Extremities/MSK - No cyanosis. Right leg - no  deformity. Site of surgery c/d/i Able to doppler DP pulses bilaterally. Right forearm splint intact. Limited ROM due to pain  Labs, radiology images, medication and microbiology have been reviewed and can be viewed in the EMR. Pertinent findings discussed below or in the assessment and plan.    Observations/Objective: The patient appears to be in no acute distress, looks ok.  She is alert and cooperative  Assessment and Plan:  See my Assessment and Plan.    A total time of >45 minutes was spent preparing to see the patient, reviewing tests, x-rays, operative reports and outside records.  Also, obtaining history from her daughter and from the chart.  Additionally, counseling the patient regarding the above listed issues.   Finally, documenting clinical information in the health records, coordination of care, educating the patient. It is a complex case.  Follow Up Instructions:    I discussed the assessment and treatment plan with the patient. The patient was provided an opportunity to ask questions and all were answered. The patient agreed with the plan and demonstrated an understanding of the instructions.   The patient was advised to call back or seek an in-person evaluation if the symptoms worsen or if the condition fails to improve as anticipated.  I provided face-to-face time during this encounter. We were at different locations.   [name], MD

## 2020-06-22 NOTE — Telephone Encounter (Signed)
° °  [place] Name: [place] Agency Name: [facility] Phone #: [phone] Service Requested: OT (examples: OT/PT/Skilled Nursing/Social Work/Speech Therapy/Wound Care) Frequency of Visits: 1W1, 2W4

## 2020-06-23 DIAGNOSIS — S52571D Other intraarticular fracture of lower end of right radius, subsequent encounter for closed fracture with routine healing: Secondary | ICD-10-CM | POA: Diagnosis not present

## 2020-06-23 DIAGNOSIS — S72141D Displaced intertrochanteric fracture of right femur, subsequent encounter for closed fracture with routine healing: Secondary | ICD-10-CM | POA: Diagnosis not present

## 2020-06-23 DIAGNOSIS — I482 Chronic atrial fibrillation, unspecified: Secondary | ICD-10-CM | POA: Diagnosis not present

## 2020-06-23 DIAGNOSIS — I5043 Acute on chronic combined systolic (congestive) and diastolic (congestive) heart failure: Secondary | ICD-10-CM | POA: Diagnosis not present

## 2020-06-23 DIAGNOSIS — I130 Hypertensive heart and chronic kidney disease with heart failure and stage 1 through stage 4 chronic kidney disease, or unspecified chronic kidney disease: Secondary | ICD-10-CM | POA: Diagnosis not present

## 2020-06-23 DIAGNOSIS — I2510 Atherosclerotic heart disease of native coronary artery without angina pectoris: Secondary | ICD-10-CM | POA: Diagnosis not present

## 2020-06-23 DIAGNOSIS — W19XXXD Unspecified fall, subsequent encounter: Secondary | ICD-10-CM | POA: Diagnosis not present

## 2020-06-23 DIAGNOSIS — I272 Pulmonary hypertension, unspecified: Secondary | ICD-10-CM | POA: Diagnosis not present

## 2020-06-23 DIAGNOSIS — I38 Endocarditis, valve unspecified: Secondary | ICD-10-CM | POA: Diagnosis not present

## 2020-06-23 DIAGNOSIS — F4323 Adjustment disorder with mixed anxiety and depressed mood: Secondary | ICD-10-CM | POA: Diagnosis not present

## 2020-06-23 DIAGNOSIS — F0390 Unspecified dementia without behavioral disturbance: Secondary | ICD-10-CM | POA: Diagnosis not present

## 2020-06-23 DIAGNOSIS — K5900 Constipation, unspecified: Secondary | ICD-10-CM | POA: Diagnosis not present

## 2020-06-23 DIAGNOSIS — N184 Chronic kidney disease, stage 4 (severe): Secondary | ICD-10-CM | POA: Diagnosis not present

## 2020-06-25 DIAGNOSIS — W19XXXD Unspecified fall, subsequent encounter: Secondary | ICD-10-CM | POA: Diagnosis not present

## 2020-06-25 DIAGNOSIS — I5043 Acute on chronic combined systolic (congestive) and diastolic (congestive) heart failure: Secondary | ICD-10-CM | POA: Diagnosis not present

## 2020-06-25 DIAGNOSIS — S72141D Displaced intertrochanteric fracture of right femur, subsequent encounter for closed fracture with routine healing: Secondary | ICD-10-CM | POA: Diagnosis not present

## 2020-06-25 DIAGNOSIS — I482 Chronic atrial fibrillation, unspecified: Secondary | ICD-10-CM | POA: Diagnosis not present

## 2020-06-25 DIAGNOSIS — S52571D Other intraarticular fracture of lower end of right radius, subsequent encounter for closed fracture with routine healing: Secondary | ICD-10-CM | POA: Diagnosis not present

## 2020-06-25 DIAGNOSIS — I38 Endocarditis, valve unspecified: Secondary | ICD-10-CM | POA: Diagnosis not present

## 2020-06-25 DIAGNOSIS — I272 Pulmonary hypertension, unspecified: Secondary | ICD-10-CM | POA: Diagnosis not present

## 2020-06-25 DIAGNOSIS — N184 Chronic kidney disease, stage 4 (severe): Secondary | ICD-10-CM | POA: Diagnosis not present

## 2020-06-25 DIAGNOSIS — F0390 Unspecified dementia without behavioral disturbance: Secondary | ICD-10-CM | POA: Diagnosis not present

## 2020-06-25 DIAGNOSIS — K5900 Constipation, unspecified: Secondary | ICD-10-CM | POA: Diagnosis not present

## 2020-06-25 DIAGNOSIS — I130 Hypertensive heart and chronic kidney disease with heart failure and stage 1 through stage 4 chronic kidney disease, or unspecified chronic kidney disease: Secondary | ICD-10-CM | POA: Diagnosis not present

## 2020-06-25 DIAGNOSIS — I2510 Atherosclerotic heart disease of native coronary artery without angina pectoris: Secondary | ICD-10-CM | POA: Diagnosis not present

## 2020-06-25 DIAGNOSIS — F4323 Adjustment disorder with mixed anxiety and depressed mood: Secondary | ICD-10-CM | POA: Diagnosis not present

## 2020-06-25 NOTE — Telephone Encounter (Signed)
Okay.  Thanks.

## 2020-06-26 ENCOUNTER — Encounter: Payer: Self-pay | Admitting: Internal Medicine

## 2020-06-26 DIAGNOSIS — M25559 Pain in unspecified hip: Secondary | ICD-10-CM | POA: Insufficient documentation

## 2020-06-26 DIAGNOSIS — F0280 Dementia in other diseases classified elsewhere without behavioral disturbance: Secondary | ICD-10-CM | POA: Insufficient documentation

## 2020-06-26 NOTE — Assessment & Plan Note (Signed)
Continue with furosemide, metoprolol

## 2020-06-26 NOTE — Assessment & Plan Note (Signed)
Worse due to recent health problems.  She is back on Zyprexa.  She is on the Remeron.  They can continue with valerian root as needed

## 2020-06-26 NOTE — Assessment & Plan Note (Addendum)
Worse due to recent health problems.  She is back on Zyprexa.  She is on the Remeron.  They can continue with valerian root as needed.  Continue with Lexapro

## 2020-06-26 NOTE — Assessment & Plan Note (Signed)
Continue with Coumadin as before

## 2020-06-26 NOTE — Assessment & Plan Note (Signed)
She is on Coumadin.

## 2020-06-26 NOTE — Assessment & Plan Note (Signed)
For pain take Tylenol PM.  Robaxin prescribed

## 2020-06-26 NOTE — Assessment & Plan Note (Signed)
No angina.  Continue with Lipitor, Coumadin, Toprol

## 2020-06-27 DIAGNOSIS — S52351D Displaced comminuted fracture of shaft of radius, right arm, subsequent encounter for closed fracture with routine healing: Secondary | ICD-10-CM | POA: Diagnosis not present

## 2020-06-27 DIAGNOSIS — S72002D Fracture of unspecified part of neck of left femur, subsequent encounter for closed fracture with routine healing: Secondary | ICD-10-CM | POA: Diagnosis not present

## 2020-06-27 DIAGNOSIS — S62101D Fracture of unspecified carpal bone, right wrist, subsequent encounter for fracture with routine healing: Secondary | ICD-10-CM | POA: Diagnosis not present

## 2020-06-27 DIAGNOSIS — S72001D Fracture of unspecified part of neck of right femur, subsequent encounter for closed fracture with routine healing: Secondary | ICD-10-CM | POA: Diagnosis not present

## 2020-06-29 ENCOUNTER — Telehealth: Payer: Self-pay | Admitting: Internal Medicine

## 2020-06-29 DIAGNOSIS — I10 Essential (primary) hypertension: Secondary | ICD-10-CM

## 2020-06-29 DIAGNOSIS — R609 Edema, unspecified: Secondary | ICD-10-CM

## 2020-06-29 NOTE — Telephone Encounter (Signed)
She will start taking Zyprexa 5 mg a full tablet at night if she is taking half a tablet of Zyprexa (olanzapine) now.  Continue with Lexapro as well.  Thanks

## 2020-06-29 NOTE — Telephone Encounter (Signed)
    [place] Name: [facility] Agency Name: [place] Phone #: [phone] Service Requested: [facility] Frequency of Visits: 2W2, 1W6, 2 AS NEEDED

## 2020-06-29 NOTE — Telephone Encounter (Signed)
OK. Thx

## 2020-06-29 NOTE — Telephone Encounter (Signed)
Patients daughter called and said that the patient is talking and moving around more at night after having recent surgery. She said that the patient has been taking escitalopram (LEXAPRO) 5 MG tablet, but she is wondering if something else could be sent in.      [place], [place] - [place]

## 2020-06-30 NOTE — Telephone Encounter (Signed)
Notified Kim w/MD response.Marland KitchenJohny Chess

## 2020-06-30 NOTE — Telephone Encounter (Signed)
Notified pt daughter [name]) w/MD response.Marland KitchenJohny Chess

## 2020-07-01 DIAGNOSIS — I130 Hypertensive heart and chronic kidney disease with heart failure and stage 1 through stage 4 chronic kidney disease, or unspecified chronic kidney disease: Secondary | ICD-10-CM | POA: Diagnosis not present

## 2020-07-01 DIAGNOSIS — I2510 Atherosclerotic heart disease of native coronary artery without angina pectoris: Secondary | ICD-10-CM | POA: Diagnosis not present

## 2020-07-01 DIAGNOSIS — I38 Endocarditis, valve unspecified: Secondary | ICD-10-CM | POA: Diagnosis not present

## 2020-07-01 DIAGNOSIS — K5900 Constipation, unspecified: Secondary | ICD-10-CM | POA: Diagnosis not present

## 2020-07-01 DIAGNOSIS — I482 Chronic atrial fibrillation, unspecified: Secondary | ICD-10-CM | POA: Diagnosis not present

## 2020-07-01 DIAGNOSIS — N184 Chronic kidney disease, stage 4 (severe): Secondary | ICD-10-CM | POA: Diagnosis not present

## 2020-07-01 DIAGNOSIS — F4323 Adjustment disorder with mixed anxiety and depressed mood: Secondary | ICD-10-CM | POA: Diagnosis not present

## 2020-07-01 DIAGNOSIS — W19XXXD Unspecified fall, subsequent encounter: Secondary | ICD-10-CM | POA: Diagnosis not present

## 2020-07-01 DIAGNOSIS — F0390 Unspecified dementia without behavioral disturbance: Secondary | ICD-10-CM | POA: Diagnosis not present

## 2020-07-01 DIAGNOSIS — S72141D Displaced intertrochanteric fracture of right femur, subsequent encounter for closed fracture with routine healing: Secondary | ICD-10-CM | POA: Diagnosis not present

## 2020-07-01 DIAGNOSIS — I5043 Acute on chronic combined systolic (congestive) and diastolic (congestive) heart failure: Secondary | ICD-10-CM | POA: Diagnosis not present

## 2020-07-01 DIAGNOSIS — I272 Pulmonary hypertension, unspecified: Secondary | ICD-10-CM | POA: Diagnosis not present

## 2020-07-01 DIAGNOSIS — S52571D Other intraarticular fracture of lower end of right radius, subsequent encounter for closed fracture with routine healing: Secondary | ICD-10-CM | POA: Diagnosis not present

## 2020-07-03 DIAGNOSIS — S52611D Displaced fracture of right ulna styloid process, subsequent encounter for closed fracture with routine healing: Secondary | ICD-10-CM | POA: Diagnosis not present

## 2020-07-03 DIAGNOSIS — S62101D Fracture of unspecified carpal bone, right wrist, subsequent encounter for fracture with routine healing: Secondary | ICD-10-CM | POA: Diagnosis not present

## 2020-07-03 DIAGNOSIS — S52501D Unspecified fracture of the lower end of right radius, subsequent encounter for closed fracture with routine healing: Secondary | ICD-10-CM | POA: Diagnosis not present

## 2020-07-05 DIAGNOSIS — S62101D Fracture of unspecified carpal bone, right wrist, subsequent encounter for fracture with routine healing: Secondary | ICD-10-CM | POA: Diagnosis not present

## 2020-07-06 DIAGNOSIS — K5900 Constipation, unspecified: Secondary | ICD-10-CM | POA: Diagnosis not present

## 2020-07-06 DIAGNOSIS — I38 Endocarditis, valve unspecified: Secondary | ICD-10-CM | POA: Diagnosis not present

## 2020-07-06 DIAGNOSIS — I5043 Acute on chronic combined systolic (congestive) and diastolic (congestive) heart failure: Secondary | ICD-10-CM | POA: Diagnosis not present

## 2020-07-06 DIAGNOSIS — S72141D Displaced intertrochanteric fracture of right femur, subsequent encounter for closed fracture with routine healing: Secondary | ICD-10-CM | POA: Diagnosis not present

## 2020-07-06 DIAGNOSIS — S52571D Other intraarticular fracture of lower end of right radius, subsequent encounter for closed fracture with routine healing: Secondary | ICD-10-CM | POA: Diagnosis not present

## 2020-07-06 DIAGNOSIS — W19XXXD Unspecified fall, subsequent encounter: Secondary | ICD-10-CM | POA: Diagnosis not present

## 2020-07-06 DIAGNOSIS — I482 Chronic atrial fibrillation, unspecified: Secondary | ICD-10-CM | POA: Diagnosis not present

## 2020-07-06 DIAGNOSIS — F0390 Unspecified dementia without behavioral disturbance: Secondary | ICD-10-CM | POA: Diagnosis not present

## 2020-07-06 DIAGNOSIS — I130 Hypertensive heart and chronic kidney disease with heart failure and stage 1 through stage 4 chronic kidney disease, or unspecified chronic kidney disease: Secondary | ICD-10-CM | POA: Diagnosis not present

## 2020-07-06 DIAGNOSIS — I2510 Atherosclerotic heart disease of native coronary artery without angina pectoris: Secondary | ICD-10-CM | POA: Diagnosis not present

## 2020-07-06 DIAGNOSIS — N184 Chronic kidney disease, stage 4 (severe): Secondary | ICD-10-CM | POA: Diagnosis not present

## 2020-07-06 DIAGNOSIS — F4323 Adjustment disorder with mixed anxiety and depressed mood: Secondary | ICD-10-CM | POA: Diagnosis not present

## 2020-07-06 DIAGNOSIS — I272 Pulmonary hypertension, unspecified: Secondary | ICD-10-CM | POA: Diagnosis not present

## 2020-07-07 DIAGNOSIS — I482 Chronic atrial fibrillation, unspecified: Secondary | ICD-10-CM | POA: Diagnosis not present

## 2020-07-07 DIAGNOSIS — Z7901 Long term (current) use of anticoagulants: Secondary | ICD-10-CM | POA: Diagnosis not present

## 2020-07-07 DIAGNOSIS — Z952 Presence of prosthetic heart valve: Secondary | ICD-10-CM | POA: Diagnosis not present

## 2020-07-08 DIAGNOSIS — I272 Pulmonary hypertension, unspecified: Secondary | ICD-10-CM | POA: Diagnosis not present

## 2020-07-08 DIAGNOSIS — F4323 Adjustment disorder with mixed anxiety and depressed mood: Secondary | ICD-10-CM | POA: Diagnosis not present

## 2020-07-08 DIAGNOSIS — F0390 Unspecified dementia without behavioral disturbance: Secondary | ICD-10-CM | POA: Diagnosis not present

## 2020-07-08 DIAGNOSIS — K5900 Constipation, unspecified: Secondary | ICD-10-CM | POA: Diagnosis not present

## 2020-07-08 DIAGNOSIS — I38 Endocarditis, valve unspecified: Secondary | ICD-10-CM | POA: Diagnosis not present

## 2020-07-08 DIAGNOSIS — I482 Chronic atrial fibrillation, unspecified: Secondary | ICD-10-CM | POA: Diagnosis not present

## 2020-07-08 DIAGNOSIS — S52571D Other intraarticular fracture of lower end of right radius, subsequent encounter for closed fracture with routine healing: Secondary | ICD-10-CM | POA: Diagnosis not present

## 2020-07-08 DIAGNOSIS — N184 Chronic kidney disease, stage 4 (severe): Secondary | ICD-10-CM | POA: Diagnosis not present

## 2020-07-08 DIAGNOSIS — I130 Hypertensive heart and chronic kidney disease with heart failure and stage 1 through stage 4 chronic kidney disease, or unspecified chronic kidney disease: Secondary | ICD-10-CM | POA: Diagnosis not present

## 2020-07-08 DIAGNOSIS — W19XXXD Unspecified fall, subsequent encounter: Secondary | ICD-10-CM | POA: Diagnosis not present

## 2020-07-08 DIAGNOSIS — S72141D Displaced intertrochanteric fracture of right femur, subsequent encounter for closed fracture with routine healing: Secondary | ICD-10-CM | POA: Diagnosis not present

## 2020-07-08 DIAGNOSIS — I2510 Atherosclerotic heart disease of native coronary artery without angina pectoris: Secondary | ICD-10-CM | POA: Diagnosis not present

## 2020-07-08 DIAGNOSIS — I5043 Acute on chronic combined systolic (congestive) and diastolic (congestive) heart failure: Secondary | ICD-10-CM | POA: Diagnosis not present

## 2020-07-10 DIAGNOSIS — I5043 Acute on chronic combined systolic (congestive) and diastolic (congestive) heart failure: Secondary | ICD-10-CM | POA: Diagnosis not present

## 2020-07-10 DIAGNOSIS — I2510 Atherosclerotic heart disease of native coronary artery without angina pectoris: Secondary | ICD-10-CM | POA: Diagnosis not present

## 2020-07-10 DIAGNOSIS — Z8673 Personal history of transient ischemic attack (TIA), and cerebral infarction without residual deficits: Secondary | ICD-10-CM

## 2020-07-10 DIAGNOSIS — F4323 Adjustment disorder with mixed anxiety and depressed mood: Secondary | ICD-10-CM | POA: Diagnosis not present

## 2020-07-10 DIAGNOSIS — Z8701 Personal history of pneumonia (recurrent): Secondary | ICD-10-CM

## 2020-07-10 DIAGNOSIS — I482 Chronic atrial fibrillation, unspecified: Secondary | ICD-10-CM | POA: Diagnosis not present

## 2020-07-10 DIAGNOSIS — I272 Pulmonary hypertension, unspecified: Secondary | ICD-10-CM | POA: Diagnosis not present

## 2020-07-10 DIAGNOSIS — F0390 Unspecified dementia without behavioral disturbance: Secondary | ICD-10-CM | POA: Diagnosis not present

## 2020-07-10 DIAGNOSIS — W19XXXD Unspecified fall, subsequent encounter: Secondary | ICD-10-CM | POA: Diagnosis not present

## 2020-07-10 DIAGNOSIS — L309 Dermatitis, unspecified: Secondary | ICD-10-CM

## 2020-07-10 DIAGNOSIS — K5900 Constipation, unspecified: Secondary | ICD-10-CM | POA: Diagnosis not present

## 2020-07-10 DIAGNOSIS — Z7901 Long term (current) use of anticoagulants: Secondary | ICD-10-CM

## 2020-07-10 DIAGNOSIS — S52571D Other intraarticular fracture of lower end of right radius, subsequent encounter for closed fracture with routine healing: Secondary | ICD-10-CM | POA: Diagnosis not present

## 2020-07-10 DIAGNOSIS — M1990 Unspecified osteoarthritis, unspecified site: Secondary | ICD-10-CM

## 2020-07-10 DIAGNOSIS — N184 Chronic kidney disease, stage 4 (severe): Secondary | ICD-10-CM | POA: Diagnosis not present

## 2020-07-10 DIAGNOSIS — I38 Endocarditis, valve unspecified: Secondary | ICD-10-CM | POA: Diagnosis not present

## 2020-07-10 DIAGNOSIS — Z952 Presence of prosthetic heart valve: Secondary | ICD-10-CM

## 2020-07-10 DIAGNOSIS — Z8614 Personal history of Methicillin resistant Staphylococcus aureus infection: Secondary | ICD-10-CM

## 2020-07-10 DIAGNOSIS — Z951 Presence of aortocoronary bypass graft: Secondary | ICD-10-CM

## 2020-07-10 DIAGNOSIS — Z9181 History of falling: Secondary | ICD-10-CM

## 2020-07-10 DIAGNOSIS — Z993 Dependence on wheelchair: Secondary | ICD-10-CM

## 2020-07-10 DIAGNOSIS — I130 Hypertensive heart and chronic kidney disease with heart failure and stage 1 through stage 4 chronic kidney disease, or unspecified chronic kidney disease: Secondary | ICD-10-CM | POA: Diagnosis not present

## 2020-07-10 DIAGNOSIS — Z7982 Long term (current) use of aspirin: Secondary | ICD-10-CM

## 2020-07-10 DIAGNOSIS — Z86711 Personal history of pulmonary embolism: Secondary | ICD-10-CM

## 2020-07-11 ENCOUNTER — Telehealth: Payer: Self-pay | Admitting: Internal Medicine

## 2020-07-11 DIAGNOSIS — S62101D Fracture of unspecified carpal bone, right wrist, subsequent encounter for fracture with routine healing: Secondary | ICD-10-CM | POA: Diagnosis not present

## 2020-07-11 DIAGNOSIS — S52501D Unspecified fracture of the lower end of right radius, subsequent encounter for closed fracture with routine healing: Secondary | ICD-10-CM | POA: Diagnosis not present

## 2020-07-11 NOTE — Telephone Encounter (Signed)
Rip Harbour from [facility] calling, states she received the home health documents for the first two pages but not pages 3-6 [phone] Phone# [phone]

## 2020-07-11 NOTE — Telephone Encounter (Signed)
Refaxed to [facility].Marland KitchenJohny [name]

## 2020-07-13 NOTE — Telephone Encounter (Signed)
Patients daughter called and was wondering if a BMP could be placed. She said her mother has been retaining some fluid. Please advise.

## 2020-07-13 NOTE — Telephone Encounter (Signed)
OK CMET Thx

## 2020-07-17 ENCOUNTER — Telehealth: Payer: Self-pay | Admitting: Internal Medicine

## 2020-07-17 DIAGNOSIS — I272 Pulmonary hypertension, unspecified: Secondary | ICD-10-CM | POA: Diagnosis not present

## 2020-07-17 DIAGNOSIS — I482 Chronic atrial fibrillation, unspecified: Secondary | ICD-10-CM | POA: Diagnosis not present

## 2020-07-17 DIAGNOSIS — I5043 Acute on chronic combined systolic (congestive) and diastolic (congestive) heart failure: Secondary | ICD-10-CM | POA: Diagnosis not present

## 2020-07-17 DIAGNOSIS — F4323 Adjustment disorder with mixed anxiety and depressed mood: Secondary | ICD-10-CM | POA: Diagnosis not present

## 2020-07-17 DIAGNOSIS — I2510 Atherosclerotic heart disease of native coronary artery without angina pectoris: Secondary | ICD-10-CM | POA: Diagnosis not present

## 2020-07-17 DIAGNOSIS — K5900 Constipation, unspecified: Secondary | ICD-10-CM | POA: Diagnosis not present

## 2020-07-17 DIAGNOSIS — I130 Hypertensive heart and chronic kidney disease with heart failure and stage 1 through stage 4 chronic kidney disease, or unspecified chronic kidney disease: Secondary | ICD-10-CM | POA: Diagnosis not present

## 2020-07-17 DIAGNOSIS — F0390 Unspecified dementia without behavioral disturbance: Secondary | ICD-10-CM | POA: Diagnosis not present

## 2020-07-17 DIAGNOSIS — W19XXXD Unspecified fall, subsequent encounter: Secondary | ICD-10-CM | POA: Diagnosis not present

## 2020-07-17 DIAGNOSIS — N184 Chronic kidney disease, stage 4 (severe): Secondary | ICD-10-CM | POA: Diagnosis not present

## 2020-07-17 DIAGNOSIS — I38 Endocarditis, valve unspecified: Secondary | ICD-10-CM | POA: Diagnosis not present

## 2020-07-17 DIAGNOSIS — S52571D Other intraarticular fracture of lower end of right radius, subsequent encounter for closed fracture with routine healing: Secondary | ICD-10-CM | POA: Diagnosis not present

## 2020-07-17 DIAGNOSIS — F329 Major depressive disorder, single episode, unspecified: Secondary | ICD-10-CM

## 2020-07-17 DIAGNOSIS — S72141D Displaced intertrochanteric fracture of right femur, subsequent encounter for closed fracture with routine healing: Secondary | ICD-10-CM | POA: Diagnosis not present

## 2020-07-17 NOTE — Telephone Encounter (Signed)
Patients daughter wondering if she can get referred to geriatric physiatry

## 2020-07-18 NOTE — Telephone Encounter (Signed)
Follow up message  Please return call to daughter 

## 2020-07-18 NOTE — Telephone Encounter (Signed)
Called daughter back she states yes psychiatrist.. someone who can help with her diagnosis, and can help her understand. She states she don't want her to get upset,  But she tells her she need to get up and exercise too. She states whichever MD prefer.Marland KitchenJohny [name]

## 2020-07-18 NOTE — Addendum Note (Signed)
Addended by: [name] on: 07/18/2020 11:39 PM   Modules accepted: Orders

## 2020-07-18 NOTE — Telephone Encounter (Signed)
Daughter was notified will go to [facility] to have done one day this week,,/lmb

## 2020-07-18 NOTE — Telephone Encounter (Signed)
Called pt daughter there was no answer LMOM RTC.Marland KitchenJohny [name]

## 2020-07-18 NOTE — Telephone Encounter (Signed)
Geriatric physiatry or psychiatry?  I do not think we have geriatric specialists in either areas here.  Thanks

## 2020-07-18 NOTE — Telephone Encounter (Signed)
Okay.  Done.  Thanks 

## 2020-07-18 NOTE — Addendum Note (Signed)
Addended by: Earnstine Regal on: 07/18/2020 12:27 PM   Modules accepted: Orders

## 2020-07-19 ENCOUNTER — Other Ambulatory Visit (INDEPENDENT_AMBULATORY_CARE_PROVIDER_SITE_OTHER): Payer: Medicaid Other

## 2020-07-19 DIAGNOSIS — I10 Essential (primary) hypertension: Secondary | ICD-10-CM

## 2020-07-19 DIAGNOSIS — R609 Edema, unspecified: Secondary | ICD-10-CM | POA: Diagnosis not present

## 2020-07-19 LAB — COMPREHENSIVE METABOLIC PANEL
ALT: 14 U/L (ref 0–35)
AST: 21 U/L (ref 0–37)
Albumin: 3.5 g/dL (ref 3.5–5.2)
Alkaline Phosphatase: 156 U/L — ABNORMAL HIGH (ref 39–117)
BUN: 24 mg/dL — ABNORMAL HIGH (ref 6–23)
CO2: 27 mEq/L (ref 19–32)
Calcium: 9.1 mg/dL (ref 8.4–10.5)
Chloride: 105 mEq/L (ref 96–112)
Creatinine, Ser: 1.16 mg/dL (ref 0.40–1.20)
GFR: 43.37 mL/min — ABNORMAL LOW (ref >60.00)
Glucose, Bld: 116 mg/dL — ABNORMAL HIGH (ref 70–99)
Potassium: 3.7 mEq/L (ref 3.5–5.1)
Sodium: 139 mEq/L (ref 135–145)
Total Bilirubin: 0.4 mg/dL (ref 0.2–1.2)
Total Protein: 7.6 g/dL (ref 6.0–8.3)

## 2020-07-26 DIAGNOSIS — S72141D Displaced intertrochanteric fracture of right femur, subsequent encounter for closed fracture with routine healing: Secondary | ICD-10-CM | POA: Diagnosis not present

## 2020-07-26 DIAGNOSIS — F0390 Unspecified dementia without behavioral disturbance: Secondary | ICD-10-CM | POA: Diagnosis not present

## 2020-07-26 DIAGNOSIS — I38 Endocarditis, valve unspecified: Secondary | ICD-10-CM | POA: Diagnosis not present

## 2020-07-26 DIAGNOSIS — I130 Hypertensive heart and chronic kidney disease with heart failure and stage 1 through stage 4 chronic kidney disease, or unspecified chronic kidney disease: Secondary | ICD-10-CM | POA: Diagnosis not present

## 2020-07-26 DIAGNOSIS — I482 Chronic atrial fibrillation, unspecified: Secondary | ICD-10-CM | POA: Diagnosis not present

## 2020-07-26 DIAGNOSIS — K5900 Constipation, unspecified: Secondary | ICD-10-CM | POA: Diagnosis not present

## 2020-07-26 DIAGNOSIS — F4323 Adjustment disorder with mixed anxiety and depressed mood: Secondary | ICD-10-CM | POA: Diagnosis not present

## 2020-07-26 DIAGNOSIS — W19XXXD Unspecified fall, subsequent encounter: Secondary | ICD-10-CM | POA: Diagnosis not present

## 2020-07-26 DIAGNOSIS — I5043 Acute on chronic combined systolic (congestive) and diastolic (congestive) heart failure: Secondary | ICD-10-CM | POA: Diagnosis not present

## 2020-07-26 DIAGNOSIS — N184 Chronic kidney disease, stage 4 (severe): Secondary | ICD-10-CM | POA: Diagnosis not present

## 2020-07-26 DIAGNOSIS — I272 Pulmonary hypertension, unspecified: Secondary | ICD-10-CM | POA: Diagnosis not present

## 2020-07-26 DIAGNOSIS — S52571D Other intraarticular fracture of lower end of right radius, subsequent encounter for closed fracture with routine healing: Secondary | ICD-10-CM | POA: Diagnosis not present

## 2020-07-26 DIAGNOSIS — I2510 Atherosclerotic heart disease of native coronary artery without angina pectoris: Secondary | ICD-10-CM | POA: Diagnosis not present

## 2020-07-27 DIAGNOSIS — I5033 Acute on chronic diastolic (congestive) heart failure: Secondary | ICD-10-CM | POA: Diagnosis not present

## 2020-07-27 DIAGNOSIS — I38 Endocarditis, valve unspecified: Secondary | ICD-10-CM | POA: Diagnosis not present

## 2020-07-27 DIAGNOSIS — S52571D Other intraarticular fracture of lower end of right radius, subsequent encounter for closed fracture with routine healing: Secondary | ICD-10-CM | POA: Diagnosis not present

## 2020-07-27 DIAGNOSIS — I10 Essential (primary) hypertension: Secondary | ICD-10-CM | POA: Diagnosis not present

## 2020-07-27 DIAGNOSIS — I2510 Atherosclerotic heart disease of native coronary artery without angina pectoris: Secondary | ICD-10-CM | POA: Diagnosis not present

## 2020-07-27 DIAGNOSIS — I5043 Acute on chronic combined systolic (congestive) and diastolic (congestive) heart failure: Secondary | ICD-10-CM | POA: Diagnosis not present

## 2020-07-27 DIAGNOSIS — F0390 Unspecified dementia without behavioral disturbance: Secondary | ICD-10-CM | POA: Diagnosis not present

## 2020-07-27 DIAGNOSIS — I272 Pulmonary hypertension, unspecified: Secondary | ICD-10-CM | POA: Diagnosis not present

## 2020-07-27 DIAGNOSIS — S72141D Displaced intertrochanteric fracture of right femur, subsequent encounter for closed fracture with routine healing: Secondary | ICD-10-CM | POA: Diagnosis not present

## 2020-07-27 DIAGNOSIS — Z952 Presence of prosthetic heart valve: Secondary | ICD-10-CM | POA: Diagnosis not present

## 2020-07-27 DIAGNOSIS — F4323 Adjustment disorder with mixed anxiety and depressed mood: Secondary | ICD-10-CM | POA: Diagnosis not present

## 2020-07-27 DIAGNOSIS — K5900 Constipation, unspecified: Secondary | ICD-10-CM | POA: Diagnosis not present

## 2020-07-27 DIAGNOSIS — I482 Chronic atrial fibrillation, unspecified: Secondary | ICD-10-CM | POA: Diagnosis not present

## 2020-07-27 DIAGNOSIS — N184 Chronic kidney disease, stage 4 (severe): Secondary | ICD-10-CM | POA: Diagnosis not present

## 2020-07-27 DIAGNOSIS — W19XXXD Unspecified fall, subsequent encounter: Secondary | ICD-10-CM | POA: Diagnosis not present

## 2020-07-27 DIAGNOSIS — I130 Hypertensive heart and chronic kidney disease with heart failure and stage 1 through stage 4 chronic kidney disease, or unspecified chronic kidney disease: Secondary | ICD-10-CM | POA: Diagnosis not present

## 2020-07-27 DIAGNOSIS — Z951 Presence of aortocoronary bypass graft: Secondary | ICD-10-CM | POA: Diagnosis not present

## 2020-07-27 DIAGNOSIS — I340 Nonrheumatic mitral (valve) insufficiency: Secondary | ICD-10-CM | POA: Diagnosis not present

## 2020-07-28 DIAGNOSIS — N184 Chronic kidney disease, stage 4 (severe): Secondary | ICD-10-CM | POA: Diagnosis not present

## 2020-07-28 DIAGNOSIS — I2510 Atherosclerotic heart disease of native coronary artery without angina pectoris: Secondary | ICD-10-CM | POA: Diagnosis not present

## 2020-07-28 DIAGNOSIS — W19XXXD Unspecified fall, subsequent encounter: Secondary | ICD-10-CM | POA: Diagnosis not present

## 2020-07-28 DIAGNOSIS — S52571D Other intraarticular fracture of lower end of right radius, subsequent encounter for closed fracture with routine healing: Secondary | ICD-10-CM | POA: Diagnosis not present

## 2020-07-28 DIAGNOSIS — F0390 Unspecified dementia without behavioral disturbance: Secondary | ICD-10-CM | POA: Diagnosis not present

## 2020-07-28 DIAGNOSIS — I5043 Acute on chronic combined systolic (congestive) and diastolic (congestive) heart failure: Secondary | ICD-10-CM | POA: Diagnosis not present

## 2020-07-28 DIAGNOSIS — F4323 Adjustment disorder with mixed anxiety and depressed mood: Secondary | ICD-10-CM | POA: Diagnosis not present

## 2020-07-28 DIAGNOSIS — I482 Chronic atrial fibrillation, unspecified: Secondary | ICD-10-CM | POA: Diagnosis not present

## 2020-07-28 DIAGNOSIS — I130 Hypertensive heart and chronic kidney disease with heart failure and stage 1 through stage 4 chronic kidney disease, or unspecified chronic kidney disease: Secondary | ICD-10-CM | POA: Diagnosis not present

## 2020-07-28 DIAGNOSIS — I272 Pulmonary hypertension, unspecified: Secondary | ICD-10-CM | POA: Diagnosis not present

## 2020-07-28 DIAGNOSIS — I38 Endocarditis, valve unspecified: Secondary | ICD-10-CM | POA: Diagnosis not present

## 2020-07-28 DIAGNOSIS — S72141D Displaced intertrochanteric fracture of right femur, subsequent encounter for closed fracture with routine healing: Secondary | ICD-10-CM | POA: Diagnosis not present

## 2020-07-28 DIAGNOSIS — K5900 Constipation, unspecified: Secondary | ICD-10-CM | POA: Diagnosis not present

## 2020-08-01 DIAGNOSIS — W19XXXD Unspecified fall, subsequent encounter: Secondary | ICD-10-CM | POA: Diagnosis not present

## 2020-08-01 DIAGNOSIS — I2510 Atherosclerotic heart disease of native coronary artery without angina pectoris: Secondary | ICD-10-CM | POA: Diagnosis not present

## 2020-08-01 DIAGNOSIS — I482 Chronic atrial fibrillation, unspecified: Secondary | ICD-10-CM | POA: Diagnosis not present

## 2020-08-01 DIAGNOSIS — I272 Pulmonary hypertension, unspecified: Secondary | ICD-10-CM | POA: Diagnosis not present

## 2020-08-01 DIAGNOSIS — F4323 Adjustment disorder with mixed anxiety and depressed mood: Secondary | ICD-10-CM | POA: Diagnosis not present

## 2020-08-01 DIAGNOSIS — I38 Endocarditis, valve unspecified: Secondary | ICD-10-CM | POA: Diagnosis not present

## 2020-08-01 DIAGNOSIS — S72141D Displaced intertrochanteric fracture of right femur, subsequent encounter for closed fracture with routine healing: Secondary | ICD-10-CM | POA: Diagnosis not present

## 2020-08-01 DIAGNOSIS — I130 Hypertensive heart and chronic kidney disease with heart failure and stage 1 through stage 4 chronic kidney disease, or unspecified chronic kidney disease: Secondary | ICD-10-CM | POA: Diagnosis not present

## 2020-08-01 DIAGNOSIS — K5900 Constipation, unspecified: Secondary | ICD-10-CM | POA: Diagnosis not present

## 2020-08-01 DIAGNOSIS — S52571D Other intraarticular fracture of lower end of right radius, subsequent encounter for closed fracture with routine healing: Secondary | ICD-10-CM | POA: Diagnosis not present

## 2020-08-01 DIAGNOSIS — N184 Chronic kidney disease, stage 4 (severe): Secondary | ICD-10-CM | POA: Diagnosis not present

## 2020-08-01 DIAGNOSIS — F0390 Unspecified dementia without behavioral disturbance: Secondary | ICD-10-CM | POA: Diagnosis not present

## 2020-08-01 DIAGNOSIS — I5043 Acute on chronic combined systolic (congestive) and diastolic (congestive) heart failure: Secondary | ICD-10-CM | POA: Diagnosis not present

## 2020-08-03 DIAGNOSIS — K5900 Constipation, unspecified: Secondary | ICD-10-CM | POA: Diagnosis not present

## 2020-08-03 DIAGNOSIS — I2510 Atherosclerotic heart disease of native coronary artery without angina pectoris: Secondary | ICD-10-CM | POA: Diagnosis not present

## 2020-08-03 DIAGNOSIS — I272 Pulmonary hypertension, unspecified: Secondary | ICD-10-CM | POA: Diagnosis not present

## 2020-08-03 DIAGNOSIS — I482 Chronic atrial fibrillation, unspecified: Secondary | ICD-10-CM | POA: Diagnosis not present

## 2020-08-03 DIAGNOSIS — N184 Chronic kidney disease, stage 4 (severe): Secondary | ICD-10-CM | POA: Diagnosis not present

## 2020-08-03 DIAGNOSIS — F0390 Unspecified dementia without behavioral disturbance: Secondary | ICD-10-CM | POA: Diagnosis not present

## 2020-08-03 DIAGNOSIS — F4323 Adjustment disorder with mixed anxiety and depressed mood: Secondary | ICD-10-CM | POA: Diagnosis not present

## 2020-08-03 DIAGNOSIS — S72141D Displaced intertrochanteric fracture of right femur, subsequent encounter for closed fracture with routine healing: Secondary | ICD-10-CM | POA: Diagnosis not present

## 2020-08-03 DIAGNOSIS — W19XXXD Unspecified fall, subsequent encounter: Secondary | ICD-10-CM | POA: Diagnosis not present

## 2020-08-03 DIAGNOSIS — I130 Hypertensive heart and chronic kidney disease with heart failure and stage 1 through stage 4 chronic kidney disease, or unspecified chronic kidney disease: Secondary | ICD-10-CM | POA: Diagnosis not present

## 2020-08-03 DIAGNOSIS — I38 Endocarditis, valve unspecified: Secondary | ICD-10-CM | POA: Diagnosis not present

## 2020-08-03 DIAGNOSIS — I5043 Acute on chronic combined systolic (congestive) and diastolic (congestive) heart failure: Secondary | ICD-10-CM | POA: Diagnosis not present

## 2020-08-03 DIAGNOSIS — S52571D Other intraarticular fracture of lower end of right radius, subsequent encounter for closed fracture with routine healing: Secondary | ICD-10-CM | POA: Diagnosis not present

## 2020-08-04 DIAGNOSIS — N184 Chronic kidney disease, stage 4 (severe): Secondary | ICD-10-CM | POA: Diagnosis not present

## 2020-08-04 DIAGNOSIS — I482 Chronic atrial fibrillation, unspecified: Secondary | ICD-10-CM | POA: Diagnosis not present

## 2020-08-04 DIAGNOSIS — W19XXXD Unspecified fall, subsequent encounter: Secondary | ICD-10-CM | POA: Diagnosis not present

## 2020-08-04 DIAGNOSIS — I2510 Atherosclerotic heart disease of native coronary artery without angina pectoris: Secondary | ICD-10-CM | POA: Diagnosis not present

## 2020-08-04 DIAGNOSIS — K5900 Constipation, unspecified: Secondary | ICD-10-CM | POA: Diagnosis not present

## 2020-08-04 DIAGNOSIS — I5043 Acute on chronic combined systolic (congestive) and diastolic (congestive) heart failure: Secondary | ICD-10-CM | POA: Diagnosis not present

## 2020-08-04 DIAGNOSIS — I272 Pulmonary hypertension, unspecified: Secondary | ICD-10-CM | POA: Diagnosis not present

## 2020-08-04 DIAGNOSIS — F0390 Unspecified dementia without behavioral disturbance: Secondary | ICD-10-CM | POA: Diagnosis not present

## 2020-08-04 DIAGNOSIS — S52571D Other intraarticular fracture of lower end of right radius, subsequent encounter for closed fracture with routine healing: Secondary | ICD-10-CM | POA: Diagnosis not present

## 2020-08-04 DIAGNOSIS — I38 Endocarditis, valve unspecified: Secondary | ICD-10-CM | POA: Diagnosis not present

## 2020-08-04 DIAGNOSIS — I130 Hypertensive heart and chronic kidney disease with heart failure and stage 1 through stage 4 chronic kidney disease, or unspecified chronic kidney disease: Secondary | ICD-10-CM | POA: Diagnosis not present

## 2020-08-04 DIAGNOSIS — S72141D Displaced intertrochanteric fracture of right femur, subsequent encounter for closed fracture with routine healing: Secondary | ICD-10-CM | POA: Diagnosis not present

## 2020-08-04 DIAGNOSIS — F4323 Adjustment disorder with mixed anxiety and depressed mood: Secondary | ICD-10-CM | POA: Diagnosis not present

## 2020-08-08 DIAGNOSIS — S52611A Displaced fracture of right ulna styloid process, initial encounter for closed fracture: Secondary | ICD-10-CM | POA: Diagnosis not present

## 2020-08-08 DIAGNOSIS — S72141D Displaced intertrochanteric fracture of right femur, subsequent encounter for closed fracture with routine healing: Secondary | ICD-10-CM | POA: Diagnosis not present

## 2020-08-08 DIAGNOSIS — S72142D Displaced intertrochanteric fracture of left femur, subsequent encounter for closed fracture with routine healing: Secondary | ICD-10-CM | POA: Diagnosis not present

## 2020-08-08 DIAGNOSIS — S52501D Unspecified fracture of the lower end of right radius, subsequent encounter for closed fracture with routine healing: Secondary | ICD-10-CM | POA: Diagnosis not present

## 2020-08-08 DIAGNOSIS — S62101D Fracture of unspecified carpal bone, right wrist, subsequent encounter for fracture with routine healing: Secondary | ICD-10-CM | POA: Diagnosis not present

## 2020-08-08 DIAGNOSIS — S72001D Fracture of unspecified part of neck of right femur, subsequent encounter for closed fracture with routine healing: Secondary | ICD-10-CM | POA: Diagnosis not present

## 2020-08-09 ENCOUNTER — Telehealth: Payer: Self-pay | Admitting: Internal Medicine

## 2020-08-09 DIAGNOSIS — F0390 Unspecified dementia without behavioral disturbance: Secondary | ICD-10-CM | POA: Diagnosis not present

## 2020-08-09 DIAGNOSIS — N184 Chronic kidney disease, stage 4 (severe): Secondary | ICD-10-CM | POA: Diagnosis not present

## 2020-08-09 DIAGNOSIS — S52571D Other intraarticular fracture of lower end of right radius, subsequent encounter for closed fracture with routine healing: Secondary | ICD-10-CM | POA: Diagnosis not present

## 2020-08-09 DIAGNOSIS — I130 Hypertensive heart and chronic kidney disease with heart failure and stage 1 through stage 4 chronic kidney disease, or unspecified chronic kidney disease: Secondary | ICD-10-CM | POA: Diagnosis not present

## 2020-08-09 DIAGNOSIS — W19XXXD Unspecified fall, subsequent encounter: Secondary | ICD-10-CM | POA: Diagnosis not present

## 2020-08-09 DIAGNOSIS — F4323 Adjustment disorder with mixed anxiety and depressed mood: Secondary | ICD-10-CM | POA: Diagnosis not present

## 2020-08-09 DIAGNOSIS — I38 Endocarditis, valve unspecified: Secondary | ICD-10-CM | POA: Diagnosis not present

## 2020-08-09 DIAGNOSIS — K5900 Constipation, unspecified: Secondary | ICD-10-CM | POA: Diagnosis not present

## 2020-08-09 DIAGNOSIS — I272 Pulmonary hypertension, unspecified: Secondary | ICD-10-CM | POA: Diagnosis not present

## 2020-08-09 DIAGNOSIS — S72141D Displaced intertrochanteric fracture of right femur, subsequent encounter for closed fracture with routine healing: Secondary | ICD-10-CM | POA: Diagnosis not present

## 2020-08-09 DIAGNOSIS — I2510 Atherosclerotic heart disease of native coronary artery without angina pectoris: Secondary | ICD-10-CM | POA: Diagnosis not present

## 2020-08-09 DIAGNOSIS — I5043 Acute on chronic combined systolic (congestive) and diastolic (congestive) heart failure: Secondary | ICD-10-CM | POA: Diagnosis not present

## 2020-08-09 DIAGNOSIS — I482 Chronic atrial fibrillation, unspecified: Secondary | ICD-10-CM | POA: Diagnosis not present

## 2020-08-09 NOTE — Telephone Encounter (Signed)
Notified [facility] w/ PT verbal. Pt is in good standing with [facility].Marland KitchenJohny Chess

## 2020-08-09 NOTE — Telephone Encounter (Signed)
   [place] Name: [facility] Agency Name: [place] Phone #: [phone] Service Requested: PT recert Frequency of Visits: 2W8, [phone]

## 2020-08-11 DIAGNOSIS — I272 Pulmonary hypertension, unspecified: Secondary | ICD-10-CM | POA: Diagnosis not present

## 2020-08-11 DIAGNOSIS — I130 Hypertensive heart and chronic kidney disease with heart failure and stage 1 through stage 4 chronic kidney disease, or unspecified chronic kidney disease: Secondary | ICD-10-CM | POA: Diagnosis not present

## 2020-08-11 DIAGNOSIS — F4323 Adjustment disorder with mixed anxiety and depressed mood: Secondary | ICD-10-CM | POA: Diagnosis not present

## 2020-08-11 DIAGNOSIS — N184 Chronic kidney disease, stage 4 (severe): Secondary | ICD-10-CM | POA: Diagnosis not present

## 2020-08-11 DIAGNOSIS — S52571D Other intraarticular fracture of lower end of right radius, subsequent encounter for closed fracture with routine healing: Secondary | ICD-10-CM | POA: Diagnosis not present

## 2020-08-11 DIAGNOSIS — S72141D Displaced intertrochanteric fracture of right femur, subsequent encounter for closed fracture with routine healing: Secondary | ICD-10-CM | POA: Diagnosis not present

## 2020-08-11 DIAGNOSIS — K5900 Constipation, unspecified: Secondary | ICD-10-CM | POA: Diagnosis not present

## 2020-08-11 DIAGNOSIS — F0390 Unspecified dementia without behavioral disturbance: Secondary | ICD-10-CM | POA: Diagnosis not present

## 2020-08-11 DIAGNOSIS — I2510 Atherosclerotic heart disease of native coronary artery without angina pectoris: Secondary | ICD-10-CM | POA: Diagnosis not present

## 2020-08-11 DIAGNOSIS — I38 Endocarditis, valve unspecified: Secondary | ICD-10-CM | POA: Diagnosis not present

## 2020-08-11 DIAGNOSIS — I5043 Acute on chronic combined systolic (congestive) and diastolic (congestive) heart failure: Secondary | ICD-10-CM | POA: Diagnosis not present

## 2020-08-11 DIAGNOSIS — W19XXXD Unspecified fall, subsequent encounter: Secondary | ICD-10-CM | POA: Diagnosis not present

## 2020-08-11 DIAGNOSIS — I482 Chronic atrial fibrillation, unspecified: Secondary | ICD-10-CM | POA: Diagnosis not present

## 2020-08-16 DIAGNOSIS — I5043 Acute on chronic combined systolic (congestive) and diastolic (congestive) heart failure: Secondary | ICD-10-CM | POA: Diagnosis not present

## 2020-08-16 DIAGNOSIS — W19XXXD Unspecified fall, subsequent encounter: Secondary | ICD-10-CM | POA: Diagnosis not present

## 2020-08-16 DIAGNOSIS — F0390 Unspecified dementia without behavioral disturbance: Secondary | ICD-10-CM | POA: Diagnosis not present

## 2020-08-16 DIAGNOSIS — I2510 Atherosclerotic heart disease of native coronary artery without angina pectoris: Secondary | ICD-10-CM | POA: Diagnosis not present

## 2020-08-16 DIAGNOSIS — I130 Hypertensive heart and chronic kidney disease with heart failure and stage 1 through stage 4 chronic kidney disease, or unspecified chronic kidney disease: Secondary | ICD-10-CM | POA: Diagnosis not present

## 2020-08-16 DIAGNOSIS — I482 Chronic atrial fibrillation, unspecified: Secondary | ICD-10-CM | POA: Diagnosis not present

## 2020-08-16 DIAGNOSIS — S52571D Other intraarticular fracture of lower end of right radius, subsequent encounter for closed fracture with routine healing: Secondary | ICD-10-CM | POA: Diagnosis not present

## 2020-08-16 DIAGNOSIS — S72141D Displaced intertrochanteric fracture of right femur, subsequent encounter for closed fracture with routine healing: Secondary | ICD-10-CM | POA: Diagnosis not present

## 2020-08-16 DIAGNOSIS — K5900 Constipation, unspecified: Secondary | ICD-10-CM | POA: Diagnosis not present

## 2020-08-16 DIAGNOSIS — I272 Pulmonary hypertension, unspecified: Secondary | ICD-10-CM | POA: Diagnosis not present

## 2020-08-16 DIAGNOSIS — N184 Chronic kidney disease, stage 4 (severe): Secondary | ICD-10-CM | POA: Diagnosis not present

## 2020-08-16 DIAGNOSIS — I38 Endocarditis, valve unspecified: Secondary | ICD-10-CM | POA: Diagnosis not present

## 2020-08-16 DIAGNOSIS — F4323 Adjustment disorder with mixed anxiety and depressed mood: Secondary | ICD-10-CM | POA: Diagnosis not present

## 2020-08-25 ENCOUNTER — Telehealth: Payer: Self-pay | Admitting: Internal Medicine

## 2020-08-25 MED ORDER — ATORVASTATIN CALCIUM 10 MG PO TABS: ORAL_TABLET | ORAL | 1 refills | Status: DC

## 2020-08-25 NOTE — Telephone Encounter (Signed)
atorvastatin (LIPITOR) 10 MG tablet [place], [place] - [place] Phone:  [phone]  Fax:  [phone]     Requesting a refill

## 2020-09-04 ENCOUNTER — Other Ambulatory Visit: Payer: Self-pay | Admitting: Internal Medicine

## 2020-09-14 ENCOUNTER — Other Ambulatory Visit: Payer: Self-pay

## 2020-09-14 ENCOUNTER — Encounter: Payer: Self-pay | Admitting: Internal Medicine

## 2020-09-14 ENCOUNTER — Ambulatory Visit: Payer: Medicaid Other | Admitting: Internal Medicine

## 2020-09-14 VITALS — BP 118/72 | HR 65 | Temp 97.5°F | Ht <= 58 in | Wt 170.6 lb

## 2020-09-14 DIAGNOSIS — I482 Chronic atrial fibrillation, unspecified: Secondary | ICD-10-CM | POA: Diagnosis not present

## 2020-09-14 DIAGNOSIS — Z23 Encounter for immunization: Secondary | ICD-10-CM

## 2020-09-14 DIAGNOSIS — I509 Heart failure, unspecified: Secondary | ICD-10-CM | POA: Diagnosis not present

## 2020-09-14 DIAGNOSIS — I2510 Atherosclerotic heart disease of native coronary artery without angina pectoris: Secondary | ICD-10-CM | POA: Diagnosis not present

## 2020-09-14 DIAGNOSIS — F0280 Dementia in other diseases classified elsewhere without behavioral disturbance: Secondary | ICD-10-CM

## 2020-09-14 DIAGNOSIS — G301 Alzheimer's disease with late onset: Secondary | ICD-10-CM

## 2020-09-14 MED ORDER — CHLORHEXIDINE GLUCONATE 4 % EX LIQD: Freq: Every day | CUTANEOUS | 3 refills | Status: DC | PRN

## 2020-09-14 MED ORDER — AQUAPHOR EX OINT: TOPICAL_OINTMENT | CUTANEOUS | 3 refills | Status: DC | PRN

## 2020-09-14 NOTE — Assessment & Plan Note (Signed)
Off Zyprexa, off Remeron

## 2020-09-14 NOTE — Progress Notes (Signed)
Subjective:  Patient ID: Alexis [name], female    DOB: 01/11/1934  Age: 87 y.o. MRN: [id number]  CC: Follow-up (2 month f/u)   HPI Alexis [name] presents for dementia, CHF, HTN, anticoagulation C/o DOE  Outpatient Medications Prior to Visit  Medication Sig Dispense Refill  . acetaminophen (TYLENOL) 500 MG tablet Take 500 mg by mouth every 6 (six) hours as needed.    [name] atorvastatin (LIPITOR) 10 MG tablet Take 1 tablet (10 mg total) by mouth daily. 90 tablet 1  . b complex vitamins tablet Take 1 tablet by mouth daily. 100 tablet 3  . Cholecalciferol (VITAMIN D3) 2000 units capsule Take 1 capsule (2,000 Units total) by mouth daily. 100 capsule 3  . escitalopram (LEXAPRO) 5 MG tablet Take 1 tablet by mouth once daily 90 tablet 1  . loratadine (CLARITIN) 10 MG tablet Take 10 mg by mouth daily as needed for allergies.    . methocarbamol (ROBAXIN) 500 MG tablet Take 1 tablet (500 mg total) by mouth every 8 (eight) hours as needed for muscle spasms. 60 tablet 1  . metoprolol succinate (TOPROL-XL) 50 MG 24 hr tablet Take 1 tablet (50 mg total) by mouth daily. Take with or immediately following a meal. 90 tablet 3  . warfarin (COUMADIN) 2.5 MG tablet TAKE 1/2 TABLET BY MOUTH IN THE EVENING ON SUNDAY, TUESDAY, THURSDAY AND SATURDAY, TAKE 1 TABLET ON MONDAY, WEDNESDAY AND FRIDAY 90 tablet 3  . furosemide (LASIX) 20 MG tablet Take 1 tablet (20 mg total) by mouth daily. 90 tablet 3  . OLANZapine (ZYPREXA) 5 MG tablet 0.5 tab qhs (Patient not taking: Reported on 09/14/2020) 30 tablet 3  . potassium chloride SA (KLOR-CON) 20 MEQ tablet Take 1 tablet (20 mEq total) by mouth daily. (Patient not taking: Reported on 09/14/2020) 90 tablet 1  . atorvastatin (LIPITOR) 10 MG tablet Take 1 by mouth at bedtime 90 tablet 1  . Melatonin 1 MG TABS Take by mouth. (Patient not taking: Reported on 09/14/2020)    . mupirocin ointment (BACTROBAN) 2 % On leg wound w/dressing change qd or bid (Patient not taking:  Reported on 09/14/2020) 30 g 0   No facility-administered medications prior to visit.    ROS: Review of Systems  Constitutional: Positive for fatigue. Negative for activity change, appetite change, chills and unexpected weight change.  HENT: Negative for congestion, mouth sores and sinus pressure.   Eyes: Negative for visual disturbance.  Respiratory: Positive for shortness of breath. Negative for cough and chest tightness.   Gastrointestinal: Negative for abdominal pain and nausea.  Genitourinary: Negative for difficulty urinating, frequency and vaginal pain.  Musculoskeletal: Positive for gait problem. Negative for back pain.  Skin: Negative for pallor and rash.  Neurological: Positive for weakness. Negative for dizziness, tremors, numbness and headaches.  Psychiatric/Behavioral: Positive for confusion, decreased concentration and sleep disturbance. The patient is nervous/anxious.     Objective:  BP 118/72 (BP Location: Left Arm)   Pulse 65   Temp (!) 97.5 F (36.4 C) (Oral)   Ht 4\' 10"  (1.473 m)   Wt 170 lb 9.6 oz (77.4 kg)   SpO2 95%   BMI 35.66 kg/m   BP Readings from Last 3 Encounters:  09/14/20 118/72  05/10/20 118/72  01/13/20 116/78    Wt Readings from Last 3 Encounters:  09/14/20 170 lb 9.6 oz (77.4 kg)  05/10/20 169 lb 9.6 oz (76.9 kg)  08/16/19 153 lb (69.4 kg)    Physical Exam Constitutional:  General: She is not in acute distress.    Appearance: She is well-developed. She is obese.  HENT:     Head: Normocephalic.     Right Ear: External ear normal.     Left Ear: External ear normal.     Nose: Nose normal.  Eyes:     General:        Right eye: No discharge.        Left eye: No discharge.     Conjunctiva/sclera: Conjunctivae normal.     Pupils: Pupils are equal, round, and reactive to light.  Neck:     Thyroid: No thyromegaly.     Vascular: No JVD.     Trachea: No tracheal deviation.  Cardiovascular:     Rate and Rhythm: Normal rate and  regular rhythm.     Heart sounds: Normal heart sounds.  Pulmonary:     Effort: No respiratory distress.     Breath sounds: No stridor. No wheezing.  Abdominal:     General: Bowel sounds are normal. There is no distension.     Palpations: Abdomen is soft. There is no mass.     Tenderness: There is no abdominal tenderness. There is no guarding or rebound.  Musculoskeletal:        General: No tenderness.     Cervical back: Normal range of motion and neck supple.  Lymphadenopathy:     Cervical: No cervical adenopathy.  Skin:    Findings: No erythema or rash.  Neurological:     Mental Status: She is oriented to person, place, and time.     Cranial Nerves: No cranial nerve deficit.     Motor: No abnormal muscle tone.     Coordination: Coordination abnormal.     Gait: Gait abnormal.     Deep Tendon Reflexes: Reflexes normal.  Psychiatric:        Mood and Affect: Mood normal.        Behavior: Behavior normal.     Lab Results  Component Value Date   WBC 8.1 05/08/2020   HGB 12.4 05/08/2020   HCT 36.3 05/08/2020   PLT 225.0 05/08/2020   GLUCOSE 116 (H) 07/19/2020   CHOL 204 (H) 05/08/2020   TRIG 297.0 (H) 05/08/2020   HDL 42.10 05/08/2020   LDLDIRECT 104.0 05/08/2020   LDLCALC 91 05/04/2018   ALT 14 07/19/2020   AST 21 07/19/2020   NA 139 07/19/2020   K 3.7 07/19/2020   CL 105 07/19/2020   CREATININE 1.16 07/19/2020   BUN 24 (H) 07/19/2020   CO2 27 07/19/2020   TSH 2.69 05/08/2020   INR 2.7 (A) 12/27/2019   HGBA1C 6.0 08/16/2019    CT HEAD WO CONTRAST  Result Date: 01/29/2019 CLINICAL DATA:  Vertigo. Memory loss. History of bilateral thalamic stroke. EXAM: CT HEAD WITHOUT CONTRAST TECHNIQUE: Contiguous axial images were obtained from the base of the skull through the vertex without intravenous contrast. COMPARISON:  MRI 07/14/2016 FINDINGS: Brain: No evidence of acute infarction, hemorrhage, hydrocephalus, extra-axial collection or mass lesion/mass effect. Prominence of  the sulci identified compatible with brain atrophy. Vascular: No hyperdense vessel or unexpected calcification. Skull: Normal. Negative for fracture or focal lesion. Sinuses/Orbits: No acute finding. Other: None IMPRESSION: 1. No acute intracranial abnormality. 2. Mild brain atrophy. Electronically Signed   By: Kerby Moors M.D.   On: 01/29/2019 09:00    Assessment & Plan:     Follow-up: No follow-ups on file.  [name], MD

## 2020-09-20 ENCOUNTER — Telehealth: Payer: Self-pay | Admitting: Internal Medicine

## 2020-09-20 NOTE — Telephone Encounter (Signed)
[facility] called and said that they think the referral was sent to them by error. They think is was supposed to go to the [place] location. Please advise

## 2020-09-21 NOTE — Telephone Encounter (Signed)
Correct. We requested a [facility] Thx

## 2020-09-21 NOTE — Telephone Encounter (Signed)
Called [facility] in [place] and left vm for them to call back with referral process [phone]  The message stated they are closed on Friday

## 2020-09-28 NOTE — Telephone Encounter (Signed)
Alexis [name] w/ Cardiac Rehab called and said to call back and ask for [facility]. They can be reached at [phone]. Please advise. She said that it was okay to LVM

## 2020-10-17 DIAGNOSIS — Z7901 Long term (current) use of anticoagulants: Secondary | ICD-10-CM | POA: Diagnosis not present

## 2020-10-17 DIAGNOSIS — Z952 Presence of prosthetic heart valve: Secondary | ICD-10-CM | POA: Diagnosis not present

## 2020-10-17 DIAGNOSIS — I482 Chronic atrial fibrillation, unspecified: Secondary | ICD-10-CM | POA: Diagnosis not present

## 2020-11-14 DIAGNOSIS — I2510 Atherosclerotic heart disease of native coronary artery without angina pectoris: Secondary | ICD-10-CM | POA: Diagnosis not present

## 2020-11-15 DIAGNOSIS — I2510 Atherosclerotic heart disease of native coronary artery without angina pectoris: Secondary | ICD-10-CM | POA: Diagnosis not present

## 2020-11-16 DIAGNOSIS — I2510 Atherosclerotic heart disease of native coronary artery without angina pectoris: Secondary | ICD-10-CM | POA: Diagnosis not present

## 2020-11-20 DIAGNOSIS — I2510 Atherosclerotic heart disease of native coronary artery without angina pectoris: Secondary | ICD-10-CM | POA: Diagnosis not present

## 2020-11-22 DIAGNOSIS — I2510 Atherosclerotic heart disease of native coronary artery without angina pectoris: Secondary | ICD-10-CM | POA: Diagnosis not present

## 2020-11-23 DIAGNOSIS — I482 Chronic atrial fibrillation, unspecified: Secondary | ICD-10-CM | POA: Diagnosis not present

## 2020-11-23 DIAGNOSIS — Z7901 Long term (current) use of anticoagulants: Secondary | ICD-10-CM | POA: Diagnosis not present

## 2020-11-23 DIAGNOSIS — I2510 Atherosclerotic heart disease of native coronary artery without angina pectoris: Secondary | ICD-10-CM | POA: Diagnosis not present

## 2020-11-23 DIAGNOSIS — Z952 Presence of prosthetic heart valve: Secondary | ICD-10-CM | POA: Diagnosis not present

## 2020-11-29 DIAGNOSIS — I2510 Atherosclerotic heart disease of native coronary artery without angina pectoris: Secondary | ICD-10-CM | POA: Diagnosis not present

## 2020-11-30 DIAGNOSIS — I2510 Atherosclerotic heart disease of native coronary artery without angina pectoris: Secondary | ICD-10-CM | POA: Diagnosis not present

## 2020-12-04 ENCOUNTER — Other Ambulatory Visit: Payer: Self-pay | Admitting: Internal Medicine

## 2020-12-04 DIAGNOSIS — I2510 Atherosclerotic heart disease of native coronary artery without angina pectoris: Secondary | ICD-10-CM | POA: Diagnosis not present

## 2020-12-06 DIAGNOSIS — I2510 Atherosclerotic heart disease of native coronary artery without angina pectoris: Secondary | ICD-10-CM | POA: Diagnosis not present

## 2020-12-07 DIAGNOSIS — I2510 Atherosclerotic heart disease of native coronary artery without angina pectoris: Secondary | ICD-10-CM | POA: Diagnosis not present

## 2020-12-11 DIAGNOSIS — I2510 Atherosclerotic heart disease of native coronary artery without angina pectoris: Secondary | ICD-10-CM | POA: Diagnosis not present

## 2020-12-13 DIAGNOSIS — I2510 Atherosclerotic heart disease of native coronary artery without angina pectoris: Secondary | ICD-10-CM | POA: Diagnosis not present

## 2020-12-14 DIAGNOSIS — I2510 Atherosclerotic heart disease of native coronary artery without angina pectoris: Secondary | ICD-10-CM | POA: Diagnosis not present

## 2020-12-18 DIAGNOSIS — I2510 Atherosclerotic heart disease of native coronary artery without angina pectoris: Secondary | ICD-10-CM | POA: Diagnosis not present

## 2020-12-25 DIAGNOSIS — I2510 Atherosclerotic heart disease of native coronary artery without angina pectoris: Secondary | ICD-10-CM | POA: Diagnosis not present

## 2020-12-27 DIAGNOSIS — I2510 Atherosclerotic heart disease of native coronary artery without angina pectoris: Secondary | ICD-10-CM | POA: Diagnosis not present

## 2020-12-28 DIAGNOSIS — I2510 Atherosclerotic heart disease of native coronary artery without angina pectoris: Secondary | ICD-10-CM | POA: Diagnosis not present

## 2021-01-01 DIAGNOSIS — I2510 Atherosclerotic heart disease of native coronary artery without angina pectoris: Secondary | ICD-10-CM | POA: Diagnosis not present

## 2021-01-02 DIAGNOSIS — Z952 Presence of prosthetic heart valve: Secondary | ICD-10-CM | POA: Diagnosis not present

## 2021-01-02 DIAGNOSIS — Z7901 Long term (current) use of anticoagulants: Secondary | ICD-10-CM | POA: Diagnosis not present

## 2021-01-02 DIAGNOSIS — I482 Chronic atrial fibrillation, unspecified: Secondary | ICD-10-CM | POA: Diagnosis not present

## 2021-01-03 DIAGNOSIS — I2510 Atherosclerotic heart disease of native coronary artery without angina pectoris: Secondary | ICD-10-CM | POA: Diagnosis not present

## 2021-01-04 DIAGNOSIS — I2510 Atherosclerotic heart disease of native coronary artery without angina pectoris: Secondary | ICD-10-CM | POA: Diagnosis not present

## 2021-01-08 DIAGNOSIS — I2510 Atherosclerotic heart disease of native coronary artery without angina pectoris: Secondary | ICD-10-CM | POA: Diagnosis not present

## 2021-01-10 ENCOUNTER — Other Ambulatory Visit: Payer: Self-pay

## 2021-01-10 ENCOUNTER — Encounter: Payer: Self-pay | Admitting: Internal Medicine

## 2021-01-10 ENCOUNTER — Ambulatory Visit: Payer: Medicaid Other | Admitting: Internal Medicine

## 2021-01-10 VITALS — BP 112/62 | HR 66 | Temp 98.0°F | Ht <= 58 in | Wt 172.6 lb

## 2021-01-10 DIAGNOSIS — H6123 Impacted cerumen, bilateral: Secondary | ICD-10-CM | POA: Diagnosis not present

## 2021-01-10 DIAGNOSIS — G301 Alzheimer's disease with late onset: Secondary | ICD-10-CM | POA: Diagnosis not present

## 2021-01-10 DIAGNOSIS — H903 Sensorineural hearing loss, bilateral: Secondary | ICD-10-CM

## 2021-01-10 DIAGNOSIS — H6120 Impacted cerumen, unspecified ear: Secondary | ICD-10-CM | POA: Insufficient documentation

## 2021-01-10 DIAGNOSIS — F0280 Dementia in other diseases classified elsewhere without behavioral disturbance: Secondary | ICD-10-CM | POA: Diagnosis not present

## 2021-01-10 DIAGNOSIS — D509 Iron deficiency anemia, unspecified: Secondary | ICD-10-CM

## 2021-01-10 DIAGNOSIS — F4323 Adjustment disorder with mixed anxiety and depressed mood: Secondary | ICD-10-CM | POA: Diagnosis not present

## 2021-01-10 DIAGNOSIS — R413 Other amnesia: Secondary | ICD-10-CM

## 2021-01-10 DIAGNOSIS — R531 Weakness: Secondary | ICD-10-CM | POA: Diagnosis not present

## 2021-01-10 DIAGNOSIS — I4821 Permanent atrial fibrillation: Secondary | ICD-10-CM

## 2021-01-10 DIAGNOSIS — I2510 Atherosclerotic heart disease of native coronary artery without angina pectoris: Secondary | ICD-10-CM | POA: Diagnosis not present

## 2021-01-10 DIAGNOSIS — H9190 Unspecified hearing loss, unspecified ear: Secondary | ICD-10-CM | POA: Insufficient documentation

## 2021-01-10 LAB — COMPREHENSIVE METABOLIC PANEL
ALT: 12 U/L (ref 0–35)
AST: 19 U/L (ref 0–37)
Albumin: 3.8 g/dL (ref 3.5–5.2)
Alkaline Phosphatase: 108 U/L (ref 39–117)
BUN: 25 mg/dL — ABNORMAL HIGH (ref 6–23)
CO2: 25 mEq/L (ref 19–32)
Calcium: 9.5 mg/dL (ref 8.4–10.5)
Chloride: 104 mEq/L (ref 96–112)
Creatinine, Ser: 1.49 mg/dL — ABNORMAL HIGH (ref 0.40–1.20)
GFR: 32.01 mL/min — ABNORMAL LOW (ref >60.00)
Glucose, Bld: 96 mg/dL (ref 70–99)
Potassium: 4.1 mEq/L (ref 3.5–5.1)
Sodium: 138 mEq/L (ref 135–145)
Total Bilirubin: 0.5 mg/dL (ref 0.2–1.2)
Total Protein: 7.8 g/dL (ref 6.0–8.3)

## 2021-01-10 LAB — CBC WITH DIFFERENTIAL/PLATELET
Basophils Absolute: 0 10*3/uL (ref 0.0–0.1)
Basophils Relative: 0.4 % (ref 0.0–3.0)
Eosinophils Absolute: 0.1 10*3/uL (ref 0.0–0.7)
Eosinophils Relative: 2.1 % (ref 0.0–5.0)
HCT: 36.6 % (ref 36.0–46.0)
Hemoglobin: 12.4 g/dL (ref 12.0–15.0)
Lymphocytes Relative: 19.3 % (ref 12.0–46.0)
Lymphs Abs: 1.4 10*3/uL (ref 0.7–4.0)
MCHC: 33.9 g/dL (ref 30.0–36.0)
MCV: 86.6 fl (ref 78.0–100.0)
Monocytes Absolute: 0.8 10*3/uL (ref 0.1–1.0)
Monocytes Relative: 11.9 % (ref 3.0–12.0)
Neutro Abs: 4.7 10*3/uL (ref 1.4–7.7)
Neutrophils Relative %: 66.3 % (ref 43.0–77.0)
Platelets: 261 10*3/uL (ref 150.0–400.0)
RBC: 4.22 Mil/uL (ref 3.87–5.11)
RDW: 14 % (ref 11.5–15.5)
WBC: 7.1 10*3/uL (ref 4.0–10.5)

## 2021-01-10 LAB — LIPID PANEL
Cholesterol: 173 mg/dL (ref 0–200)
HDL: 41.4 mg/dL (ref >39.00)
NonHDL: 131.85
Total CHOL/HDL Ratio: 4
Triglycerides: 305 mg/dL — ABNORMAL HIGH (ref 0.0–149.0)
VLDL: 61 mg/dL — ABNORMAL HIGH (ref 0.0–40.0)

## 2021-01-10 LAB — LDL CHOLESTEROL, DIRECT: Direct LDL: 90 mg/dL

## 2021-01-10 LAB — TSH: TSH: 2.51 u[IU]/mL (ref 0.35–5.50)

## 2021-01-10 NOTE — Assessment & Plan Note (Signed)
On Aricept 

## 2021-01-10 NOTE — Addendum Note (Signed)
Addended by: Boris Lown B on: 01/10/2021 02:25 PM   Modules accepted: Orders

## 2021-01-10 NOTE — Progress Notes (Signed)
Subjective:  Patient ID: Alexis [name], female    DOB: 04/05/1934  Age: 87 y.o. MRN: [id number]  CC: disoriented and Hearing Problem   HPI Alexis [name] presents for CHF, dyslipidemia, depression C/o hearing loss C/o leg fatigue and pain/weakness w/stationary biking. No SOB. C/o wt gain 4 lbs  Outpatient Medications Prior to Visit  Medication Sig Dispense Refill   acetaminophen (TYLENOL) 500 MG tablet Take 500 mg by mouth every 6 (six) hours as needed.     atorvastatin (LIPITOR) 10 MG tablet Take 1 tablet (10 mg total) by mouth daily. 90 tablet 1   b complex vitamins tablet Take 1 tablet by mouth daily. 100 tablet 3   Cholecalciferol (VITAMIN D3) 2000 units capsule Take 1 capsule (2,000 Units total) by mouth daily. 100 capsule 3   escitalopram (LEXAPRO) 5 MG tablet Take 1 tablet by mouth once daily 90 tablet 1   furosemide (LASIX) 40 MG tablet Take by mouth. Take 1 Twice a day     metoprolol succinate (TOPROL-XL) 50 MG 24 hr tablet TAKE 1 TABLET BY MOUTH ONCE DAILY. TAKE WITH OR IMMEDIATELY FOLLOWING A MEAL 90 tablet 1   mineral oil-hydrophilic petrolatum (AQUAPHOR) ointment Apply topically as needed for dry skin. 420 g 3   potassium chloride SA (KLOR-CON) 20 MEQ tablet Take 1 tablet (20 mEq total) by mouth daily. 90 tablet 1   warfarin (COUMADIN) 2.5 MG tablet TAKE 1/2 TABLET BY MOUTH IN THE EVENING ON SUNDAY, TUESDAY, THURSDAY AND SATURDAY, TAKE 1 TABLET ON MONDAY, WEDNESDAY AND FRIDAY 90 tablet 3   chlorhexidine (HIBICLENS) 4 % external liquid Apply topically daily as needed. Use prn for skin cleaning (Patient not taking: Reported on 01/10/2021) 240 mL 3   furosemide (LASIX) 20 MG tablet Take 1 tablet (20 mg total) by mouth daily. 90 tablet 3   loratadine (CLARITIN) 10 MG tablet Take 10 mg by mouth daily as needed for allergies. (Patient not taking: Reported on 01/10/2021)     methocarbamol (ROBAXIN) 500 MG tablet Take 1 tablet (500 mg total) by mouth every 8 (eight) hours  as needed for muscle spasms. (Patient not taking: Reported on 01/10/2021) 60 tablet 1   No facility-administered medications prior to visit.    ROS: Review of Systems  Constitutional:  Positive for fatigue. Negative for activity change, appetite change, chills and unexpected weight change.  HENT:  Positive for hearing loss. Negative for congestion, mouth sores and sinus pressure.   Eyes:  Negative for visual disturbance.  Respiratory:  Negative for cough and chest tightness.   Gastrointestinal:  Negative for abdominal pain and nausea.  Genitourinary:  Negative for difficulty urinating, frequency and vaginal pain.  Musculoskeletal:  Positive for arthralgias and gait problem. Negative for back pain.  Skin:  Negative for pallor and rash.  Neurological:  Positive for weakness. Negative for dizziness, tremors, numbness and headaches.  Psychiatric/Behavioral:  Positive for decreased concentration. Negative for confusion, sleep disturbance and suicidal ideas.    Objective:  BP 112/62 (BP Location: Left Arm)   Pulse 66   Temp 98 F (36.7 C) (Oral)   Ht 4\' 10"  (1.473 m)   Wt 172 lb 9.6 oz (78.3 kg)   SpO2 96%   BMI 36.07 kg/m   BP Readings from Last 3 Encounters:  01/10/21 112/62  09/14/20 118/72  05/10/20 118/72    Wt Readings from Last 3 Encounters:  01/10/21 172 lb 9.6 oz (78.3 kg)  09/14/20 170 lb 9.6 oz (77.4 kg)  05/10/20 169  lb 9.6 oz (76.9 kg)    Physical Exam Constitutional:      General: She is not in acute distress.    Appearance: She is well-developed. She is obese.  HENT:     Head: Normocephalic.     Right Ear: External ear normal.     Left Ear: External ear normal.     Nose: Nose normal.  Eyes:     General:        Right eye: No discharge.        Left eye: No discharge.     Conjunctiva/sclera: Conjunctivae normal.     Pupils: Pupils are equal, round, and reactive to light.  Neck:     Thyroid: No thyromegaly.     Vascular: No JVD.     Trachea: No tracheal  deviation.  Cardiovascular:     Rate and Rhythm: Normal rate and regular rhythm.     Heart sounds: Normal heart sounds.  Pulmonary:     Effort: No respiratory distress.     Breath sounds: No stridor. No wheezing.  Abdominal:     General: Bowel sounds are normal. There is no distension.     Palpations: Abdomen is soft. There is no mass.     Tenderness: There is no abdominal tenderness. There is no guarding or rebound.  Musculoskeletal:        General: Tenderness present.     Cervical back: Normal range of motion and neck supple. No rigidity.  Lymphadenopathy:     Cervical: No cervical adenopathy.  Skin:    Findings: No erythema or rash.  Neurological:     Cranial Nerves: No cranial nerve deficit.     Motor: Weakness present. No abnormal muscle tone.     Coordination: Coordination normal.     Gait: Gait abnormal.     Deep Tendon Reflexes: Reflexes normal.  Psychiatric:        Behavior: Behavior normal.        Thought Content: Thought content normal.        Judgment: Judgment normal.  In a w/c Hard hearing Ataxic Antalgic gait  Ear wax B  Lab Results  Component Value Date   WBC 8.1 05/08/2020   HGB 12.4 05/08/2020   HCT 36.3 05/08/2020   PLT 225.0 05/08/2020   GLUCOSE 116 (H) 07/19/2020   CHOL 204 (H) 05/08/2020   TRIG 297.0 (H) 05/08/2020   HDL 42.10 05/08/2020   LDLDIRECT 104.0 05/08/2020   LDLCALC 91 05/04/2018   ALT 14 07/19/2020   AST 21 07/19/2020   NA 139 07/19/2020   K 3.7 07/19/2020   CL 105 07/19/2020   CREATININE 1.16 07/19/2020   BUN 24 (H) 07/19/2020   CO2 27 07/19/2020   TSH 2.69 05/08/2020   INR 2.7 (A) 12/27/2019   HGBA1C 6.0 08/16/2019    CT HEAD WO CONTRAST  Result Date: 01/29/2019 CLINICAL DATA:  Vertigo. Memory loss. History of bilateral thalamic stroke. EXAM: CT HEAD WITHOUT CONTRAST TECHNIQUE: Contiguous axial images were obtained from the base of the skull through the vertex without intravenous contrast. COMPARISON:  MRI 07/14/2016  FINDINGS: Brain: No evidence of acute infarction, hemorrhage, hydrocephalus, extra-axial collection or mass lesion/mass effect. Prominence of the sulci identified compatible with brain atrophy. Vascular: No hyperdense vessel or unexpected calcification. Skull: Normal. Negative for fracture or focal lesion. Sinuses/Orbits: No acute finding. Other: None IMPRESSION: 1. No acute intracranial abnormality. 2. Mild brain atrophy. Electronically Signed   By: Kerby Moors M.D.   On:  01/29/2019 09:00    Assessment & Plan:     [name], MD

## 2021-01-10 NOTE — Assessment & Plan Note (Signed)
Not better. She is back on Zyprexa.  She is on the Remeron.  They can continue with valerian root as needed

## 2021-01-10 NOTE — Assessment & Plan Note (Addendum)
No change She is on Zyprexa.  She is on the Remeron.  They can continue with valerian root as needed

## 2021-01-10 NOTE — Assessment & Plan Note (Signed)
ENT ref 

## 2021-01-10 NOTE — Assessment & Plan Note (Signed)
Multifactorial NAS diet

## 2021-01-11 DIAGNOSIS — I2510 Atherosclerotic heart disease of native coronary artery without angina pectoris: Secondary | ICD-10-CM | POA: Diagnosis not present

## 2021-01-11 LAB — URINALYSIS, ROUTINE W REFLEX MICROSCOPIC
Hgb urine dipstick: NEGATIVE
Nitrite: NEGATIVE
RBC / HPF: NONE SEEN (ref 0–?)
Specific Gravity, Urine: 1.02 (ref 1.000–1.030)
Urine Glucose: NEGATIVE
Urobilinogen, UA: 0.2 (ref 0.0–1.0)
pH: 5.5 (ref 5.0–8.0)

## 2021-01-11 LAB — IRON,TIBC AND FERRITIN PANEL
%SAT: 24 % (calc) (ref 16–45)
Ferritin: 21 ng/mL (ref 16–288)
Iron: 81 ug/dL (ref 45–160)
TIBC: 332 mcg/dL (calc) (ref 250–450)

## 2021-01-15 DIAGNOSIS — I2510 Atherosclerotic heart disease of native coronary artery without angina pectoris: Secondary | ICD-10-CM | POA: Diagnosis not present

## 2021-01-17 DIAGNOSIS — I2510 Atherosclerotic heart disease of native coronary artery without angina pectoris: Secondary | ICD-10-CM | POA: Diagnosis not present

## 2021-01-18 ENCOUNTER — Telehealth: Payer: Self-pay | Admitting: Internal Medicine

## 2021-01-18 DIAGNOSIS — I2510 Atherosclerotic heart disease of native coronary artery without angina pectoris: Secondary | ICD-10-CM | POA: Diagnosis not present

## 2021-01-18 NOTE — Telephone Encounter (Signed)
Daughter called, can't get mom into ENT doctor until October, made an appointment with [facility] on Monday 8.29.22  Daughter wants to know if she still needs to give her mom the drops for her ears  Please call the daughter at [phone]

## 2021-01-19 NOTE — Telephone Encounter (Signed)
Notified daughter w/MD response../lmb 

## 2021-01-19 NOTE — Telephone Encounter (Signed)
Yes. Thx.

## 2021-01-22 ENCOUNTER — Encounter: Payer: Self-pay | Admitting: Internal Medicine

## 2021-01-22 ENCOUNTER — Other Ambulatory Visit: Payer: Self-pay

## 2021-01-22 ENCOUNTER — Ambulatory Visit: Payer: Medicaid Other | Admitting: Internal Medicine

## 2021-01-22 DIAGNOSIS — G301 Alzheimer's disease with late onset: Secondary | ICD-10-CM | POA: Diagnosis not present

## 2021-01-22 DIAGNOSIS — R413 Other amnesia: Secondary | ICD-10-CM

## 2021-01-22 DIAGNOSIS — F0280 Dementia in other diseases classified elsewhere without behavioral disturbance: Secondary | ICD-10-CM

## 2021-01-22 DIAGNOSIS — L309 Dermatitis, unspecified: Secondary | ICD-10-CM | POA: Diagnosis not present

## 2021-01-22 DIAGNOSIS — I2510 Atherosclerotic heart disease of native coronary artery without angina pectoris: Secondary | ICD-10-CM | POA: Diagnosis not present

## 2021-01-22 DIAGNOSIS — H6123 Impacted cerumen, bilateral: Secondary | ICD-10-CM

## 2021-01-22 MED ORDER — TRIAMCINOLONE ACETONIDE 0.1 % EX CREA: 1.0000 "application " | TOPICAL_CREAM | Freq: Two times a day (BID) | CUTANEOUS | 3 refills | Status: DC

## 2021-01-22 NOTE — Assessment & Plan Note (Addendum)
On  Aricept p.o. we will continue

## 2021-01-22 NOTE — Assessment & Plan Note (Addendum)
Chronic eczema on the shoulders, right wrist.  Prescribed triamcinolone cream  Rx

## 2021-01-22 NOTE — Assessment & Plan Note (Signed)
Worse due to recent health problems.  She is back on Zyprexa.  She is on the Remeron.  They can continue with valerian root as needed

## 2021-01-22 NOTE — Progress Notes (Signed)
Subjective:  Patient ID: Alexis [name], female    DOB: 01/22/1934  Age: 87 y.o. MRN: [id number]  CC: Ear Fullness (Both with pain)   HPI Alexis [name] presents for rash on back, R wrist C/o ear wax  Outpatient Medications Prior to Visit  Medication Sig Dispense Refill   acetaminophen (TYLENOL) 500 MG tablet Take 500 mg by mouth every 6 (six) hours as needed.     atorvastatin (LIPITOR) 10 MG tablet Take 1 tablet (10 mg total) by mouth daily. 90 tablet 1   b complex vitamins tablet Take 1 tablet by mouth daily. 100 tablet 3   Cholecalciferol (VITAMIN D3) 2000 units capsule Take 1 capsule (2,000 Units total) by mouth daily. 100 capsule 3   escitalopram (LEXAPRO) 5 MG tablet Take 1 tablet by mouth once daily 90 tablet 1   furosemide (LASIX) 40 MG tablet Take by mouth. Take 1 Twice a day     metoprolol succinate (TOPROL-XL) 50 MG 24 hr tablet TAKE 1 TABLET BY MOUTH ONCE DAILY. TAKE WITH OR IMMEDIATELY FOLLOWING A MEAL 90 tablet 1   mineral oil-hydrophilic petrolatum (AQUAPHOR) ointment Apply topically as needed for dry skin. 420 g 3   potassium chloride SA (KLOR-CON) 20 MEQ tablet Take 1 tablet (20 mEq total) by mouth daily. 90 tablet 1   warfarin (COUMADIN) 2.5 MG tablet TAKE 1/2 TABLET BY MOUTH IN THE EVENING ON SUNDAY, TUESDAY, THURSDAY AND SATURDAY, TAKE 1 TABLET ON MONDAY, WEDNESDAY AND FRIDAY 90 tablet 3   No facility-administered medications prior to visit.    ROS: Review of Systems  Constitutional:  Negative for activity change, appetite change, chills, fatigue and unexpected weight change.  HENT:  Negative for congestion, mouth sores and sinus pressure.   Eyes:  Negative for visual disturbance.  Respiratory:  Negative for cough and chest tightness.   Gastrointestinal:  Negative for abdominal pain and nausea.  Genitourinary:  Negative for difficulty urinating, frequency and vaginal pain.  Musculoskeletal:  Positive for arthralgias and gait problem. Negative for  back pain.  Skin:  Negative for pallor and rash.  Neurological:  Positive for weakness. Negative for dizziness, tremors, numbness and headaches.  Psychiatric/Behavioral:  Negative for confusion, sleep disturbance and suicidal ideas. The patient is nervous/anxious.    Objective:  BP 112/76 (BP Location: Left Arm)   Pulse 65   Temp 98.1 F (36.7 C) (Oral)   SpO2 97%   BP Readings from Last 3 Encounters:  01/22/21 112/76  01/10/21 112/62  09/14/20 118/72    Wt Readings from Last 3 Encounters:  01/10/21 172 lb 9.6 oz (78.3 kg)  09/14/20 170 lb 9.6 oz (77.4 kg)  05/10/20 169 lb 9.6 oz (76.9 kg)    Physical Exam    Procedure Note :     Procedure :  Ear irrigation right and left ears   Indication:  Cerumen impaction right and left ears   Risks, including pain, dizziness, eardrum perforation, bleeding, infection and others as well as benefits were explained to the patient in detail. Verbal consent was obtained and the patient agreed to proceed.    We used "The Elephant Ear Irrigation Device" filled with lukewarm water for irrigation. A large amount wax was recovered from both ears. Procedure has also required manual wax removal/instrumentation with an ear wax curette and ear forceps on the right and left ears.   Tolerated well. Complications: None.   Postprocedure instructions :  Call if problems.  Lab Results  Component Value Date  WBC 7.1 01/10/2021   HGB 12.4 01/10/2021   HCT 36.6 01/10/2021   PLT 261.0 01/10/2021   GLUCOSE 96 01/10/2021   CHOL 173 01/10/2021   TRIG 305.0 (H) 01/10/2021   HDL 41.40 01/10/2021   LDLDIRECT 90.0 01/10/2021   LDLCALC 91 05/04/2018   ALT 12 01/10/2021   AST 19 01/10/2021   NA 138 01/10/2021   K 4.1 01/10/2021   CL 104 01/10/2021   CREATININE 1.49 (H) 01/10/2021   BUN 25 (H) 01/10/2021   CO2 25 01/10/2021   TSH 2.51 01/10/2021   INR 2.7 (A) 12/27/2019   HGBA1C 6.0 08/16/2019    CT HEAD WO CONTRAST  Result Date:  01/29/2019 CLINICAL DATA:  Vertigo. Memory loss. History of bilateral thalamic stroke. EXAM: CT HEAD WITHOUT CONTRAST TECHNIQUE: Contiguous axial images were obtained from the base of the skull through the vertex without intravenous contrast. COMPARISON:  MRI 07/14/2016 FINDINGS: Brain: No evidence of acute infarction, hemorrhage, hydrocephalus, extra-axial collection or mass lesion/mass effect. Prominence of the sulci identified compatible with brain atrophy. Vascular: No hyperdense vessel or unexpected calcification. Skull: Normal. Negative for fracture or focal lesion. Sinuses/Orbits: No acute finding. Other: None IMPRESSION: 1. No acute intracranial abnormality. 2. Mild brain atrophy. Electronically Signed   By: Kerby Moors M.D.   On: 01/29/2019 09:00    Assessment & Plan:     [name], MD

## 2021-01-22 NOTE — Assessment & Plan Note (Signed)
See procedure 

## 2021-01-31 DIAGNOSIS — I2510 Atherosclerotic heart disease of native coronary artery without angina pectoris: Secondary | ICD-10-CM | POA: Diagnosis not present

## 2021-02-01 DIAGNOSIS — I2510 Atherosclerotic heart disease of native coronary artery without angina pectoris: Secondary | ICD-10-CM | POA: Diagnosis not present

## 2021-02-01 DIAGNOSIS — I340 Nonrheumatic mitral (valve) insufficiency: Secondary | ICD-10-CM | POA: Diagnosis not present

## 2021-02-01 DIAGNOSIS — I482 Chronic atrial fibrillation, unspecified: Secondary | ICD-10-CM | POA: Diagnosis not present

## 2021-02-05 DIAGNOSIS — I2510 Atherosclerotic heart disease of native coronary artery without angina pectoris: Secondary | ICD-10-CM | POA: Diagnosis not present

## 2021-02-07 DIAGNOSIS — I2510 Atherosclerotic heart disease of native coronary artery without angina pectoris: Secondary | ICD-10-CM | POA: Diagnosis not present

## 2021-02-08 DIAGNOSIS — I2510 Atherosclerotic heart disease of native coronary artery without angina pectoris: Secondary | ICD-10-CM | POA: Diagnosis not present

## 2021-02-12 DIAGNOSIS — I2510 Atherosclerotic heart disease of native coronary artery without angina pectoris: Secondary | ICD-10-CM | POA: Diagnosis not present

## 2021-02-13 DIAGNOSIS — I482 Chronic atrial fibrillation, unspecified: Secondary | ICD-10-CM | POA: Diagnosis not present

## 2021-02-13 DIAGNOSIS — Z7901 Long term (current) use of anticoagulants: Secondary | ICD-10-CM | POA: Diagnosis not present

## 2021-02-13 DIAGNOSIS — Z952 Presence of prosthetic heart valve: Secondary | ICD-10-CM | POA: Diagnosis not present

## 2021-02-20 ENCOUNTER — Telehealth (INDEPENDENT_AMBULATORY_CARE_PROVIDER_SITE_OTHER): Payer: Medicaid Other | Admitting: Family Medicine

## 2021-02-20 ENCOUNTER — Other Ambulatory Visit: Payer: Self-pay

## 2021-02-20 ENCOUNTER — Encounter: Payer: Self-pay | Admitting: Family Medicine

## 2021-02-20 DIAGNOSIS — U071 COVID-19: Secondary | ICD-10-CM

## 2021-02-20 MED ORDER — MOLNUPIRAVIR EUA 200MG CAPSULE: 4.0000 | ORAL_CAPSULE | Freq: Two times a day (BID) | ORAL | 0 refills | Status: AC

## 2021-02-20 MED ORDER — BENZONATATE 100 MG PO CAPS: 100.0000 mg | ORAL_CAPSULE | Freq: Three times a day (TID) | ORAL | 0 refills | Status: DC | PRN

## 2021-02-20 NOTE — Progress Notes (Signed)
Chief Complaint  Patient presents with   Covid Positive    Alexis [name] here for URI complaints. Due to COVID-19 pandemic, we are interacting via web portal for an electronic face-to-face visit. I verified patient's ID using 2 identifiers. Patient's daughter agreed to proceed with visit via this method. Patient is at home, I am at office. Patient, her daughter and I are present for visit. Daughter translates.   Duration: 1 day  Associated symptoms: Fever (99's), sore throat, myalgia, and coughing Denies: sinus congestion, sinus pain, rhinorrhea, itchy watery eyes, ear pain, ear drainage, wheezing, shortness of breath, and N/V/D, loss of taste/smell Treatment to date: Tylenol, warm tea Sick contacts: Yes; daughter Tested + today Had the [place] series and 1 booster.   Past Medical History:  Diagnosis Date   Abnormal brain MRI    Adjustment disorder with mixed anxiety and depressed mood 07/09/2016   Anxiety    Atrial fibrillation (HCC)    CAD (coronary artery disease) of artery bypass graft    Cerebrovascular disease    Chronic atrial fibrillation ([place]) 05/31/2008   Atrial Fibrillation   Congestive heart failure ([place]) 05/31/2008   2011 - resolved s/p CABG and valve replacement Altace    CONSTIPATION 05/31/2008   Qualifier: Diagnosis of  By: Doralee Albino     Coronary atherosclerosis 05/31/2008   S/p CABG 2011   Coronary Artery Disease  10/1 IMO update   CVA (cerebral vascular accident) ([place]) 05/2000   "memory issues, dizziness since" (07/08/2016   Depression    Dizzinesses    "often since OHS 2009" (07/08/2016)   Eczema 06/06/2016   1/18 back   Encounter for monitoring Coumadin therapy 03/30/2012   2011    Endocarditis dx'd 05/2016   Essential hypertension 05/31/2008   Chronic      Fever 12/03/2016   2/18 h/o Strep viridance bacteremia in 2/18. PICC line is out.  We may need to remove her 2 remaining teeth   Foot fracture, left    "no OR; from fall"   Gastroenteritis 06/20/2014    Likely viral 1/16    GERD (gastroesophageal reflux disease)    hx   High cholesterol    History of blood transfusion ~ 1960   Hypertension    Long term current use of anticoagulant therapy 06/13/2017   Long-term (current) use of anticoagulants, INR goal 2.0-3.0 06/26/2017   Medication reaction 07/08/2016   Memory loss 08/16/2013   3/15 mild 2019 Worse Start Aricept  B complex qd   Mitral valve replaced 07/08/2016   Myalgia 05/31/2008   Qualifier: Diagnosis of  By: Doralee Albino     Orthostatic hypotension 06/20/2014   Likely due to viral gastroenteritis 1/16 Hold Ramipril and Spironolactone x 3 days Labs    Other chest pain 05/31/2008   1/18 L scapula  MSK after raking leaves No CP now  Atypical Chest Pain   Pneumonia 1-2 X   Polyarthritis rheumatica ([place])    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([place])    Seizures ([place])    2/18 abn EEG ???thalamic CVA - somnolent On Keppra   Shoulder blade pain 06/06/2016   1/18 MSK   Streptococcal endocarditis 07/08/2016   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 Stop date for antibiotic treatment was March 12. Due to multiple reactions and borderline renal  function she is now on vancomycin and genta    Objective No conversational dyspnea Age appropriate judgment and insight Nml affect and mood  COVID-19 - Plan: molnupiravir EUA (LAGEVRIO) 200 mg CAPS capsule, benzonatate (TESSALON) 100 MG capsule  Given age and co-morbidities, will give 5 d of antiviral. Benzonatate prn.  Continue to push fluids, practice good hand hygiene, cover mouth when coughing. Quarantining guidelines discussed.  F/u prn. If starting to experience irreplaceable fluid loss, shaking, or shortness of breath, seek immediate care. Pt's daughter voiced understanding and  agreement to the plan.  [place], DO 02/20/21 1:09 PM

## 2021-02-22 ENCOUNTER — Other Ambulatory Visit: Payer: Self-pay | Admitting: Internal Medicine

## 2021-02-23 NOTE — Telephone Encounter (Signed)
Warfarin is not managed at this clinic. Pt has warfarin managed with cardiology at [facility]. Cannot forward note so denying refill and making note for pharmacy that pt receives from cardiology

## 2021-02-27 ENCOUNTER — Other Ambulatory Visit: Payer: Self-pay | Admitting: Internal Medicine

## 2021-02-27 ENCOUNTER — Other Ambulatory Visit: Payer: Self-pay

## 2021-02-27 MED ORDER — ESCITALOPRAM OXALATE 5 MG PO TABS: 5.0000 mg | ORAL_TABLET | Freq: Every day | ORAL | 1 refills | Status: DC

## 2021-02-28 ENCOUNTER — Other Ambulatory Visit: Payer: Self-pay | Admitting: Internal Medicine

## 2021-02-28 NOTE — Telephone Encounter (Signed)
Coumadin management for this pt is at [facility] under cardiologist Dr. Gerarda Fraction.  Contacted [facility] and advised. They reported a Dr. Delphia Grates sent it in yesterday and to discard the refill request.  Denied refill.

## 2021-03-09 DIAGNOSIS — I340 Nonrheumatic mitral (valve) insufficiency: Secondary | ICD-10-CM | POA: Diagnosis not present

## 2021-03-27 DIAGNOSIS — Z952 Presence of prosthetic heart valve: Secondary | ICD-10-CM | POA: Diagnosis not present

## 2021-03-27 DIAGNOSIS — I482 Chronic atrial fibrillation, unspecified: Secondary | ICD-10-CM | POA: Diagnosis not present

## 2021-03-27 DIAGNOSIS — Z7901 Long term (current) use of anticoagulants: Secondary | ICD-10-CM | POA: Diagnosis not present

## 2021-03-29 DIAGNOSIS — H6122 Impacted cerumen, left ear: Secondary | ICD-10-CM | POA: Diagnosis not present

## 2021-03-29 DIAGNOSIS — H93293 Other abnormal auditory perceptions, bilateral: Secondary | ICD-10-CM | POA: Diagnosis not present

## 2021-03-30 DIAGNOSIS — H903 Sensorineural hearing loss, bilateral: Secondary | ICD-10-CM | POA: Diagnosis not present

## 2021-05-02 ENCOUNTER — Other Ambulatory Visit: Payer: Self-pay | Admitting: Internal Medicine

## 2021-05-08 DIAGNOSIS — I482 Chronic atrial fibrillation, unspecified: Secondary | ICD-10-CM | POA: Diagnosis not present

## 2021-05-08 DIAGNOSIS — Z7901 Long term (current) use of anticoagulants: Secondary | ICD-10-CM | POA: Diagnosis not present

## 2021-05-08 DIAGNOSIS — Z952 Presence of prosthetic heart valve: Secondary | ICD-10-CM | POA: Diagnosis not present

## 2021-06-01 ENCOUNTER — Telehealth: Payer: Self-pay | Admitting: Internal Medicine

## 2021-06-01 NOTE — Telephone Encounter (Signed)
[facility] does not manage this pts coumadin. Pt is seen at [facility] for coumadin management. Msg should be forwarded to PCP or pharmacy contacted to let them know someone else is managing coumadin  and the script should be sent to that provider.

## 2021-06-01 NOTE — Telephone Encounter (Signed)
Patient daughter calling in to check status of refill request.. patient is completely out of medication  Advised of turnaround time

## 2021-06-01 NOTE — Telephone Encounter (Signed)
1.Medication Requested: Warfarin  2. Pharmacy (Name, Street, [place]): [place], [place]  3. On Med List: yes 4. Last Visit with PCP: 08.29.22  5. Next visit date with PCP: n/a   Agent: Please be advised that RX refills may take up to 3 business days. We ask that you follow-up with your pharmacy.

## 2021-06-01 NOTE — Telephone Encounter (Signed)
Forwarding msg to [employer], Therapist, sports in coumadin clinic.Marland KitchenJohny Chess

## 2021-06-04 MED ORDER — WARFARIN SODIUM 2.5 MG PO TABS: ORAL_TABLET | ORAL | 3 refills | Status: DC

## 2021-06-04 NOTE — Telephone Encounter (Signed)
Okay.  Done.  Thanks 

## 2021-06-06 ENCOUNTER — Other Ambulatory Visit: Payer: Self-pay | Admitting: Internal Medicine

## 2021-06-11 ENCOUNTER — Ambulatory Visit (INDEPENDENT_AMBULATORY_CARE_PROVIDER_SITE_OTHER): Payer: Medicaid Other | Admitting: Sports Medicine

## 2021-06-11 ENCOUNTER — Ambulatory Visit (INDEPENDENT_AMBULATORY_CARE_PROVIDER_SITE_OTHER): Payer: Medicaid Other

## 2021-06-11 ENCOUNTER — Other Ambulatory Visit: Payer: Self-pay

## 2021-06-11 VITALS — Ht <= 58 in

## 2021-06-11 DIAGNOSIS — M25571 Pain in right ankle and joints of right foot: Secondary | ICD-10-CM

## 2021-06-11 DIAGNOSIS — M79671 Pain in right foot: Secondary | ICD-10-CM

## 2021-06-11 NOTE — Patient Instructions (Addendum)
Good to see you Continue the use of your wheelchair non weight bearing for 2 weeks  Continue to rest, elevate, ice , compression  Tylenol 325 mg 2 tablets 2 times a day for pain  2 week follow up

## 2021-06-11 NOTE — Progress Notes (Signed)
Alexis [name] D.[place] Phone: [phone]   Assessment and Plan:     1. Right foot pain 2. Right ankle pain, unspecified chronicity -Acute, uncomplicated, initial sports medicine visit - Acute injury of right ankle with likely inversion ankle injury after a fall on 06/09/2021 - X-rays obtained in clinic.  My interpretation: No acute fracture or dislocation.  Cortical changes indicating midfoot and ankle arthritis.  Valgus deformity of first digit, chronic in nature - We will continue with conservative therapy including RICE therapy, Tylenol 325 mg 2 tablets twice daily - Due to swelling and patient's pain, recommended nonweightbearing, using wheelchair for 2 weeks - DG Ankle/foot complete Right; Future  -Patient was accompanied by her daughter who translated through the visit.  Interpreter offered and declined.  Patient's daughter also helped provide HPI  Pertinent previous records reviewed include none   Follow Up: 2 weeks for reevaluation   Subjective:   I, Alexis [name], am serving as a Education administrator for Doctor Glennon Mac  Chief Complaint: right ankle and foot pain   HPI:   06/11/21 Patient is an 88 year old female complaining of right ankle and foot pain. Patient states that she fell Saturday afternoon she fell with a walker she tangled her legs when she was trying to get up from fall, its swollen has iced and elevated was taking tylenol.  Painful to walk on.  Never had injury to this ankle before.  [address] last year and had bilateral hip fractures.  Relevant Historical Information: Hypertension, CHF, A. fib, history of seizures  Additional pertinent review of systems negative.   Current Outpatient Medications:    acetaminophen (TYLENOL) 500 MG tablet, Take 500 mg by mouth every 6 (six) hours as needed., Disp: , Rfl:    atorvastatin (LIPITOR) 10 MG tablet, TAKE 1 TABLET BY MOUTH AT BEDTIME, Disp:  90 tablet, Rfl: 2   b complex vitamins tablet, Take 1 tablet by mouth daily., Disp: 100 tablet, Rfl: 3   benzonatate (TESSALON) 100 MG capsule, Take 1 capsule (100 mg total) by mouth 3 (three) times daily as needed., Disp: 30 capsule, Rfl: 0   Cholecalciferol (VITAMIN D3) 2000 units capsule, Take 1 capsule (2,000 Units total) by mouth daily., Disp: 100 capsule, Rfl: 3   escitalopram (LEXAPRO) 5 MG tablet, Take 1 tablet (5 mg total) by mouth daily., Disp: 90 tablet, Rfl: 1   furosemide (LASIX) 40 MG tablet, Take by mouth. Take 1 Twice a day, Disp: , Rfl:    metoprolol succinate (TOPROL-XL) 50 MG 24 hr tablet, TAKE 1 TABLET BY MOUTH ONCE DAILY (TAKE  WITH  OR  IMMEDIATELY  FOLLOWING  A  MEAL), Disp: 90 tablet, Rfl: 1   mineral oil-hydrophilic petrolatum (AQUAPHOR) ointment, Apply topically as needed for dry skin., Disp: 420 g, Rfl: 3   potassium chloride SA (KLOR-CON) 20 MEQ tablet, Take 1 tablet (20 mEq total) by mouth daily., Disp: 90 tablet, Rfl: 1   triamcinolone cream (KENALOG) 0.1 %, Apply 1 application topically 2 (two) times daily., Disp: 160 g, Rfl: 3   warfarin (COUMADIN) 2.5 MG tablet, TAKE 1/2 TABLET BY MOUTH IN THE EVENING ON SUNDAY, TUESDAY, THURSDAY AND SATURDAY, TAKE 1 TABLET ON MONDAY, WEDNESDAY AND FRIDAY, Disp: 90 tablet, Rfl: 3   Objective:     Vitals:   06/11/21 1441  Height: 4\' 10"  (1.473 m)      Body mass index is 36.07 kg/m.  Physical Exam:    Gen: Appears well, nad, nontoxic and pleasant Psych: Alert and oriented, appropriate mood and affect Neuro: sensation intact, strength is 5/5 with df/pf/inv/ev, muscle tone wnl Skin: no susupicious lesions or rashes  Ankle: no deformity, moderate swelling and joint effusion TTP lateral malleolus, medial malleolus, base of fifth, midfoot, navicular NTTP over fibular head,   calcaneous   ROM DF 15, PF 30, inv/ev significantly limited Positive ant drawer, talar tilt, rotation test, squeeze test. Neg thompson  pain with  resisted inversion or eversion    Electronically signed by:  Alexis [name] D.[facility] 3:20 PM 06/11/21

## 2021-06-12 ENCOUNTER — Telehealth: Payer: Self-pay | Admitting: Internal Medicine

## 2021-06-12 ENCOUNTER — Other Ambulatory Visit: Payer: Self-pay | Admitting: Sports Medicine

## 2021-06-12 DIAGNOSIS — M25571 Pain in right ankle and joints of right foot: Secondary | ICD-10-CM

## 2021-06-12 NOTE — Telephone Encounter (Signed)
Per chart appt has been made for Sport Medicine 06/25/20.Marland KitchenJohny Chess

## 2021-06-12 NOTE — Progress Notes (Signed)
CT referral was put in

## 2021-06-12 NOTE — Telephone Encounter (Signed)
Connected to [facility] 1.14.2023.  Caller states mother twisted her right ankle when going for a walk today causing her to fall. Denies head injury or other injuries. She always walks with a walker and was able to get up with assistance. Current pain is 2-3/10 without movement. More painful with movement at 3-4/10.  Advised to see PCP within 24 hours.

## 2021-06-14 ENCOUNTER — Telehealth: Payer: Self-pay

## 2021-06-14 ENCOUNTER — Ambulatory Visit
Admission: RE | Admit: 2021-06-14 | Discharge: 2021-06-14 | Disposition: A | Discharge status: Home / Self Care | Payer: Medicaid Other | Source: Ambulatory Visit | Attending: Sports Medicine | Admitting: Sports Medicine

## 2021-06-14 DIAGNOSIS — M25571 Pain in right ankle and joints of right foot: Secondary | ICD-10-CM

## 2021-06-14 NOTE — Telephone Encounter (Signed)
Pt daughter calling in requesting the the CT imaging order be sent to [facility].  [address], [place], [place] [address]  [phone] Phone

## 2021-06-14 NOTE — Telephone Encounter (Signed)
Daughter calling in to cancel request to have imaging order sent to [facility] as patient was able to have imaging done at [place]

## 2021-06-15 ENCOUNTER — Other Ambulatory Visit: Payer: Self-pay | Admitting: Sports Medicine

## 2021-06-15 DIAGNOSIS — M25571 Pain in right ankle and joints of right foot: Secondary | ICD-10-CM

## 2021-06-15 NOTE — Progress Notes (Signed)
Referral was sent in to Dr. Fransisco Hertz in [place]

## 2021-06-16 NOTE — Telephone Encounter (Signed)
What CT? Thx

## 2021-06-18 DIAGNOSIS — S82891A Other fracture of right lower leg, initial encounter for closed fracture: Secondary | ICD-10-CM | POA: Insufficient documentation

## 2021-06-19 NOTE — Progress Notes (Deleted)
° °   Benito Mccreedy D.[place] Phone: [phone]   Assessment and Plan:     There are no diagnoses linked to this encounter.  ***   Pertinent previous records reviewed include ***   Follow Up: ***     Subjective:   I, Alisabeth Selkirk, am serving as a Education administrator for Doctor Glennon Mac  Chief Complaint: right foot pain   HPI:  06/11/21 Patient is an 88 year old female complaining of right ankle and foot pain. Patient states that she fell Saturday afternoon she fell with a walker she tangled her legs when she was trying to get up from fall, its swollen has iced and elevated was taking tylenol.  Painful to walk on.  Never had injury to this ankle before.  [place] [address] last year and had bilateral hip fractures.  06/25/2021 Patient states      Relevant Historical Information: Hypertension, CHF, A. fib, history of seizures  Additional pertinent review of systems negative.   Current Outpatient Medications:    acetaminophen (TYLENOL) 500 MG tablet, Take 500 mg by mouth every 6 (six) hours as needed., Disp: , Rfl:    atorvastatin (LIPITOR) 10 MG tablet, TAKE 1 TABLET BY MOUTH AT BEDTIME, Disp: 90 tablet, Rfl: 2   b complex vitamins tablet, Take 1 tablet by mouth daily., Disp: 100 tablet, Rfl: 3   benzonatate (TESSALON) 100 MG capsule, Take 1 capsule (100 mg total) by mouth 3 (three) times daily as needed., Disp: 30 capsule, Rfl: 0   Cholecalciferol (VITAMIN D3) 2000 units capsule, Take 1 capsule (2,000 Units total) by mouth daily., Disp: 100 capsule, Rfl: 3   escitalopram (LEXAPRO) 5 MG tablet, Take 1 tablet (5 mg total) by mouth daily., Disp: 90 tablet, Rfl: 1   furosemide (LASIX) 40 MG tablet, Take by mouth. Take 1 Twice a day, Disp: , Rfl:    metoprolol succinate (TOPROL-XL) 50 MG 24 hr tablet, TAKE 1 TABLET BY MOUTH ONCE DAILY (TAKE  WITH  OR  IMMEDIATELY  FOLLOWING  A  MEAL), Disp: 90 tablet, Rfl: 1   mineral  oil-hydrophilic petrolatum (AQUAPHOR) ointment, Apply topically as needed for dry skin., Disp: 420 g, Rfl: 3   potassium chloride SA (KLOR-CON) 20 MEQ tablet, Take 1 tablet (20 mEq total) by mouth daily., Disp: 90 tablet, Rfl: 1   triamcinolone cream (KENALOG) 0.1 %, Apply 1 application topically 2 (two) times daily., Disp: 160 g, Rfl: 3   warfarin (COUMADIN) 2.5 MG tablet, TAKE 1/2 TABLET BY MOUTH IN THE EVENING ON SUNDAY, TUESDAY, THURSDAY AND SATURDAY, TAKE 1 TABLET ON MONDAY, WEDNESDAY AND FRIDAY, Disp: 90 tablet, Rfl: 3   Objective:     There were no vitals filed for this visit.    There is no height or weight on file to calculate BMI.    Physical Exam:    ***   Electronically signed by:  Benito Mccreedy D.[facility] 3:29 PM 06/19/21

## 2021-06-25 ENCOUNTER — Ambulatory Visit: Payer: Medicaid Other | Admitting: Sports Medicine

## 2021-06-28 DIAGNOSIS — Z952 Presence of prosthetic heart valve: Secondary | ICD-10-CM | POA: Diagnosis not present

## 2021-06-28 DIAGNOSIS — Z7901 Long term (current) use of anticoagulants: Secondary | ICD-10-CM | POA: Diagnosis not present

## 2021-06-28 DIAGNOSIS — I482 Chronic atrial fibrillation, unspecified: Secondary | ICD-10-CM | POA: Diagnosis not present

## 2021-07-01 NOTE — Progress Notes (Signed)
Subjective:    Patient ID: Alexis [name], female    DOB: [birth year], 88 y.o.   MRN: [id number]  This visit occurred during the SARS-CoV-2 public health emergency.  Safety protocols were in place, including screening questions prior to the visit, additional usage of staff PPE, and extensive cleaning of exam room while observing appropriate contact time as indicated for disinfecting solutions.    HPI The patient is here for an acute visit.  Pain from broken right ankle -    she injured herself 06/09/21 - she fell.    She saw sports medicine 1/16.    Ct scan 1/19 showed acute comminuted nondisplaced fracture of medial and lateral malleoli.  Small hematoma lateral malleollar fracture  She saw ortho 1/23 - no surgery needed.  In cast.  To f/u with ortho 3 weeks after visit.   Advised to take otc pain meds  - tylenol alt with advil.    INR 3.8 last week - likely from too much ibuprofen.  They adjusted her warfarin.   This past weekend she was in severe pain.  She tried to call orthopedic office and they did not call back.  She sees him on Monday.    Her pain today is 7/10 right now.  She is taking ibuprofen    SOB when transferring.  No SOB at rest.  She feels this is from trying to hurry when she is transferring.    Medications and allergies reviewed with patient and updated if appropriate.  Patient Active Problem List   Diagnosis Date Noted   Impacted ear wax 01/10/2021   Hearing loss 01/10/2021   Weakness 01/10/2021   Alzheimer's dementia ([place]) 06/26/2020   Hip pain 06/26/2020   Abscess 05/10/2020   Diarrhea 01/13/2020   Neoplasm of uncertain behavior of skin 01/13/2020   Dysarthria 08/16/2019   Heel ulcer ([place]) 08/16/2019   Acute on chronic diastolic heart failure ([place]) 03/16/2019   Leg weakness 02/03/2019   Long-term (current) use of anticoagulants, INR goal 2.0-3.0 06/26/2017   Fever 12/03/2016   Abnormal brain MRI    Seizures ([place])    Cerebrovascular  disease    Adjustment disorder with mixed anxiety and depressed mood 07/09/2016   Medication reaction 07/08/2016   Streptococcal endocarditis 07/08/2016   Mitral valve replaced 07/08/2016   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[facility])    Atrial fibrillation ([place])    Shoulder blade pain 06/06/2016   Eczema 06/06/2016   Gastroenteritis 06/20/2014   Orthostatic hypotension 06/20/2014   Memory loss 08/16/2013   Encounter for monitoring Coumadin therapy 03/30/2012   Essential hypertension 05/31/2008   Coronary atherosclerosis 05/31/2008   Congestive heart failure ([place]) 05/31/2008   CONSTIPATION 05/31/2008   Myalgia 05/31/2008   Other chest pain 05/31/2008   Chronic atrial fibrillation 05/31/2008    Current Outpatient Medications on File Prior to Visit  Medication Sig Dispense Refill   acetaminophen (TYLENOL) 500 MG tablet Take 500 mg by mouth every 6 (six) hours as needed.     atorvastatin (LIPITOR) 10 MG tablet TAKE 1 TABLET BY MOUTH AT BEDTIME 90 tablet 2   b complex vitamins tablet Take 1 tablet by mouth daily. 100 tablet 3   benzonatate (TESSALON) 100 MG capsule Take 1 capsule (100 mg total) by mouth 3 (three) times daily as needed. 30 capsule 0   Cholecalciferol (VITAMIN D3) 2000 units capsule Take 1 capsule (2,000 Units total) by mouth daily. 100 capsule 3   escitalopram (LEXAPRO) 5 MG tablet Take 1 tablet (  5 mg total) by mouth daily. 90 tablet 1   furosemide (LASIX) 40 MG tablet Take by mouth. Take 1 Twice a day     IBUPROFEN PO Take by mouth.     metoprolol succinate (TOPROL-XL) 50 MG 24 hr tablet TAKE 1 TABLET BY MOUTH ONCE DAILY (TAKE  WITH  OR  IMMEDIATELY  FOLLOWING  A  MEAL) 90 tablet 1   mineral oil-hydrophilic petrolatum (AQUAPHOR) ointment Apply topically as needed for dry skin. 420 g 3   potassium chloride SA (KLOR-CON) 20 MEQ tablet Take 1 tablet (20 mEq total) by mouth daily. 90 tablet 1   triamcinolone cream (KENALOG) 0.1 % Apply 1 application topically 2 (two) times daily.  160 g 3   warfarin (COUMADIN) 2.5 MG tablet TAKE 1/2 TABLET BY MOUTH IN THE EVENING ON SUNDAY, TUESDAY, THURSDAY AND SATURDAY, TAKE 1 TABLET ON MONDAY, WEDNESDAY AND FRIDAY 90 tablet 3   No current facility-administered medications on file prior to visit.    Past Medical History:  Diagnosis Date   Abnormal brain MRI    Adjustment disorder with mixed anxiety and depressed mood 07/09/2016   Anxiety    Atrial fibrillation (HCC)    CAD (coronary artery disease) of artery bypass graft    Cerebrovascular disease    Chronic atrial fibrillation ([place]) 05/31/2008   Atrial Fibrillation   Congestive heart failure ([place]) 05/31/2008   2011 - resolved s/p CABG and valve replacement Altace    CONSTIPATION 05/31/2008   Qualifier: Diagnosis of  By: Doralee Albino     Coronary atherosclerosis 05/31/2008   S/p CABG 2011   Coronary Artery Disease  10/1 IMO update   CVA (cerebral vascular accident) ([place]) 05/2000   "memory issues, dizziness since" (07/08/2016   Depression    Dizzinesses    "often since [facility] 2009" (07/08/2016)   Eczema 06/06/2016   1/18 back   Encounter for monitoring Coumadin therapy 03/30/2012   2011    Endocarditis dx'd 05/2016   Essential hypertension 05/31/2008   Chronic      Fever 12/03/2016   2/18 h/o Strep viridance bacteremia in 2/18. PICC line is out.  We may need to remove her 2 remaining teeth   Foot fracture, left    "no OR; from fall"   Gastroenteritis 06/20/2014   Likely viral 1/16    GERD (gastroesophageal reflux disease)    hx   High cholesterol    History of blood transfusion ~ 1960   Hypertension    Long term current use of anticoagulant therapy 06/13/2017   Long-term (current) use of anticoagulants, INR goal 2.0-3.0 06/26/2017   Medication reaction 07/08/2016   Memory loss 08/16/2013   3/15 mild 2019 Worse Start Aricept  B complex qd   Mitral valve replaced 07/08/2016   Myalgia 05/31/2008   Qualifier: Diagnosis of  By: Doralee Albino     Orthostatic hypotension 06/20/2014    Likely due to viral gastroenteritis 1/16 Hold Ramipril and Spironolactone x 3 days Labs    Other chest pain 05/31/2008   1/18 L scapula  MSK after raking leaves No CP now  Atypical Chest Pain   Pneumonia 1-2 X   Polyarthritis rheumatica ([place])    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([place])    Seizures ([place])    2/18 abn EEG ???thalamic CVA - somnolent On Keppra   Shoulder blade pain 06/06/2016   1/18 MSK   Streptococcal endocarditis 07/08/2016   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 Stop date for antibiotic treatment was March 12. Due to multiple reactions and borderline renal function she is now on vancomycin and genta    Past Surgical History:  Procedure Laterality Date   AORTIC VALVE REPLACEMENT  03/2008   CARDIAC VALVE REPLACEMENT     CATARACT EXTRACTION W/ INTRAOCULAR LENS IMPLANT Left    CORONARY ANGIOPLASTY  2009   CORONARY ARTERY BYPASS GRAFT  03/2008   MITRAL VALVE REPAIR (MV)/CORONARY ARTERY BYPASS GRAFTING (CABG)  03/2008   2009 [facility] hospital   TEE WITHOUT CARDIOVERSION N/A 06/26/2016   Procedure: TRANSESOPHAGEAL ECHOCARDIOGRAM (TEE);  Surgeon: Josue Hector, MD;  Location: [facility] ENDOSCOPY;  Service: Cardiovascular;  Laterality: N/A;    Social History   Socioeconomic History   Marital status: Widowed    Spouse name: Not on file   Number of children: Not on file   Years of education: Not on file   Highest education level: Not on file  Occupational History   Not on file  Tobacco Use   Smoking status: Never   Smokeless tobacco: Never  Vaping Use   Vaping Use: Never used  Substance and Sexual Activity   Alcohol use: No   Drug use: No   Sexual activity: Not on file  Other Topics Concern   Not on file  Social History Narrative   Right handed    Caffeine:  drinks 2 cups decaf coffee per day   2 cups tea per day   Social Determinants of Health   Financial Resource Strain: Not on file  Food Insecurity: Not on file  Transportation Needs: Not on file  Physical Activity: Not on file  Stress: Not on file  Social Connections: Not on file    Family History  Problem Relation Age of Onset   Stroke Maternal Grandmother    Lung cancer Brother    CAD Neg Hx     Review of Systems  Constitutional:  Negative for fever.  Respiratory:  Positive for shortness of breath (with transferring). Negative for cough and wheezing.   Cardiovascular:  Negative for chest pain and palpitations.      Objective:   Vitals:   07/02/21 1432  BP: 118/68  Pulse: 71  Temp: 98.2 F (36.8 C)  SpO2: 97%   BP Readings from Last 3 Encounters:  07/02/21 118/68  01/22/21 112/76  01/10/21 112/62   Wt Readings from Last 3 Encounters:  01/10/21 172 lb 9.6 oz (78.3 kg)  09/14/20 170 lb 9.6 oz (77.4 kg)  05/10/20 169 lb 9.6 oz (76.9 kg)   Body mass index is 36.07 kg/m.   Physical Exam Constitutional:      General: She is not in acute distress.    Appearance: Normal appearance. She is not ill-appearing.  HENT:     Head: Normocephalic and atraumatic.  Cardiovascular:     Rate and Rhythm: Normal rate and regular rhythm.  Pulmonary:     Effort: Pulmonary effort is normal. No respiratory distress.     Breath sounds: No wheezing or rales.  Skin:    General: Skin is warm and dry.  Neurological:     Mental Status: She is alert.       CT ANKLE RIGHT WO CONTRAST CLINICAL DATA:  Evaluate right ankle fracture  EXAM: CT OF THE RIGHT ANKLE WITHOUT CONTRAST  TECHNIQUE: Multidetector CT  imaging of the right ankle was performed according to the standard protocol. Multiplanar CT image reconstructions were also generated.  RADIATION DOSE REDUCTION: This exam was performed according to the departmental dose-optimization program which includes  automated exposure control, adjustment of the mA and/or kV according to patient size and/or use of iterative reconstruction technique.  COMPARISON:  X-ray 06/11/2021  FINDINGS: Bones/Joint/Cartilage  Acute comminuted nondisplaced fracture of the medial malleolus with horizontal and vertical components. Obliquely oriented nondisplaced fracture through the lateral malleolus. No posterior malleolar fracture component. Ankle mortise is congruent without widening or dislocation. Talus and calcaneus are intact. Subtalar joints are aligned. No acute fracture or malalignment within the midfoot. Moderate degenerative changes are most pronounced at the second through fourth tarsometatarsal joints. Bones are demineralized.  Ligaments  Suboptimally assessed by CT.  Muscles and Tendons  No acute musculotendinous abnormality by CT.  Soft tissues  Soft tissue swelling and edema. Small hematoma overlies the lateral malleolar fracture site measuring approximately 3.6 x 0.9 cm. Atherosclerotic vascular calcifications are present.  IMPRESSION: 1. Acute comminuted nondisplaced fractures of the medial and lateral malleoli, as described above. 2. Small hematoma overlies the lateral malleolar fracture site.  Electronically Signed   By: Davina Poke D.O.   On: 06/15/2021 08:42     Assessment & Plan:     Right b/l malleoli fractures of right ankle: Subacute Fractured last month Has seen sports med and orthopedics In cast Pain is not controlled with tylenol alternating with ibuprofen -- INR elevated so warfarin dose was adjusted Start tramadol 50-100 mg tid prn - can take 1000 mg tylenol with this Warfarin dose was adjusted so she is still taking ibuprofen - has close f/u with coumadin clinic Start stool softener Daughter understands this will cause drowsiness/confusion possibly - increased risk of falls - will give her the lowest dose possible  and watch her closely

## 2021-07-02 ENCOUNTER — Ambulatory Visit (INDEPENDENT_AMBULATORY_CARE_PROVIDER_SITE_OTHER): Payer: Medicaid Other | Admitting: Internal Medicine

## 2021-07-02 ENCOUNTER — Encounter: Payer: Self-pay | Admitting: Internal Medicine

## 2021-07-02 ENCOUNTER — Telehealth: Payer: Self-pay | Admitting: Internal Medicine

## 2021-07-02 ENCOUNTER — Other Ambulatory Visit: Payer: Self-pay

## 2021-07-02 VITALS — BP 118/68 | HR 71 | Temp 98.2°F | Ht <= 58 in

## 2021-07-02 DIAGNOSIS — S8254XA Nondisplaced fracture of medial malleolus of right tibia, initial encounter for closed fracture: Secondary | ICD-10-CM | POA: Diagnosis not present

## 2021-07-02 DIAGNOSIS — S8264XA Nondisplaced fracture of lateral malleolus of right fibula, initial encounter for closed fracture: Secondary | ICD-10-CM | POA: Diagnosis not present

## 2021-07-02 MED ORDER — TRAMADOL HCL 50 MG PO TABS: 50.0000 mg | ORAL_TABLET | Freq: Three times a day (TID) | ORAL | 0 refills | Status: DC | PRN

## 2021-07-02 NOTE — Patient Instructions (Addendum)
° ° ° °  Medications changes include :   tramadol 50-100 mg three times a day as needed for severe pain.  take 1000 mg of Tylenol with tramadol.    Start a stool softener with the tramadol because it can cause constipation.    Your prescription(s) have been submitted to your pharmacy. Please take as directed and contact our office if you believe you are having problem(s) with the medication(s).

## 2021-07-02 NOTE — Telephone Encounter (Signed)
Connected to [facility] 2.4.2023.  Caller states her mother fractured her right foot on 06/09/2021. This past week she started having increased pain but then calmed down. She started screaming of pain today and states that Tylenol and Ibuprofen don't improve her pain and is she is requesting a prescribed med for pain control.  Advised to see HCP within 4 hours. Appointment scheduled.

## 2021-07-03 NOTE — Telephone Encounter (Signed)
Returned patient call left voicemail, however patient needs to see sports meds

## 2021-07-09 DIAGNOSIS — S82891D Other fracture of right lower leg, subsequent encounter for closed fracture with routine healing: Secondary | ICD-10-CM | POA: Diagnosis not present

## 2021-07-09 DIAGNOSIS — S82841D Displaced bimalleolar fracture of right lower leg, subsequent encounter for closed fracture with routine healing: Secondary | ICD-10-CM | POA: Diagnosis not present

## 2021-07-09 DIAGNOSIS — S72001D Fracture of unspecified part of neck of right femur, subsequent encounter for closed fracture with routine healing: Secondary | ICD-10-CM | POA: Insufficient documentation

## 2021-08-03 IMAGING — CT CT HEAD W/O CM
1 series · 16 of 30 positions shown, 20 images · non-contrast
Comparison: MRI 07/14/2016

CLINICAL DATA: Vertigo. Memory loss. History of bilateral thalamic
stroke.

EXAM:
CT HEAD WITHOUT CONTRAST
TECHNIQUE: Contiguous axial images were obtained from the base of the skull
through the vertex without intravenous contrast.

[Series 2: head w/(date) · axial · 0.37mm/px · z∈[-202,-52]mm · 16 of 34 slices shown, 20 images]
[im 2/34  brain]
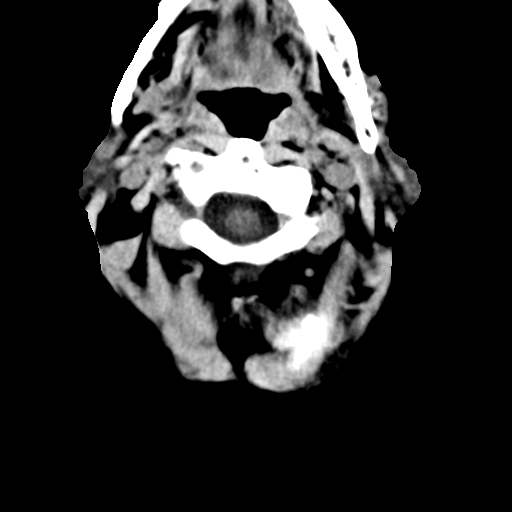
[im 2/34  bone]
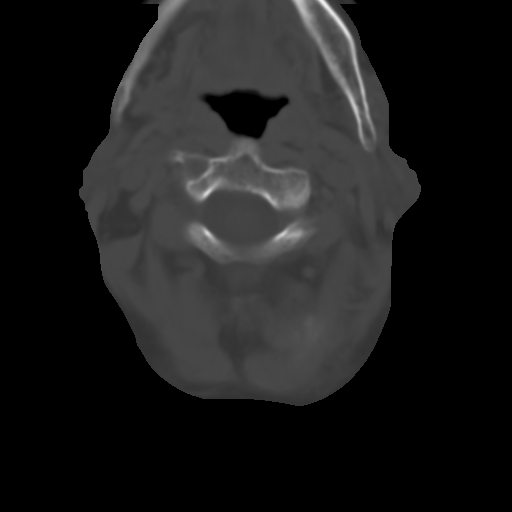
[im 4/34  brain]
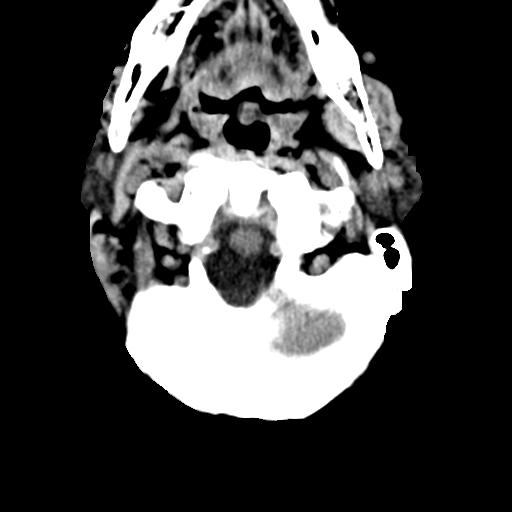
[im 6/34  brain]
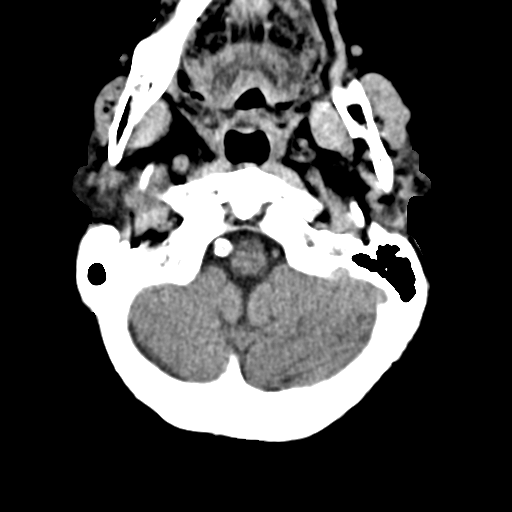
[im 8/34  brain]
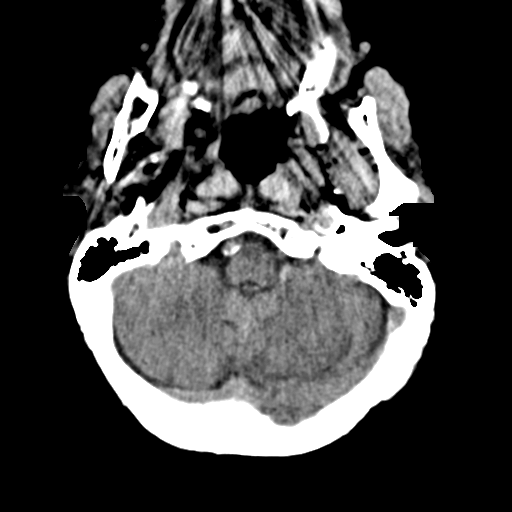
[im 10/34  brain]
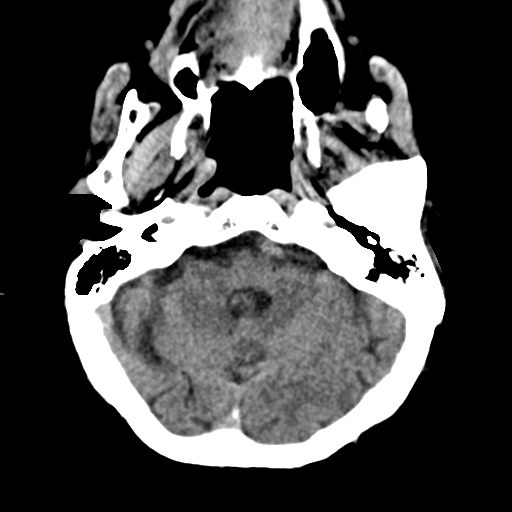
[im 10/34  bone]
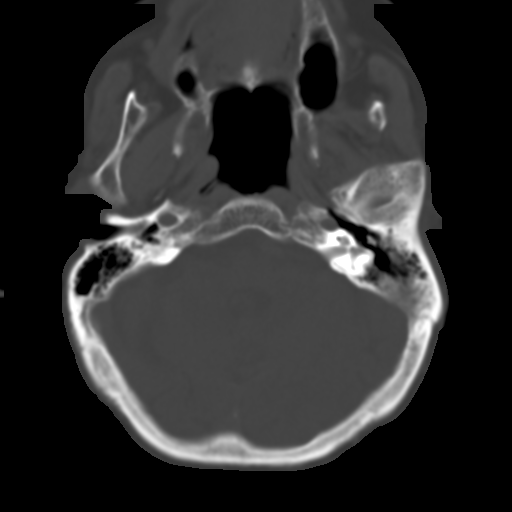
[im 12/34  brain]
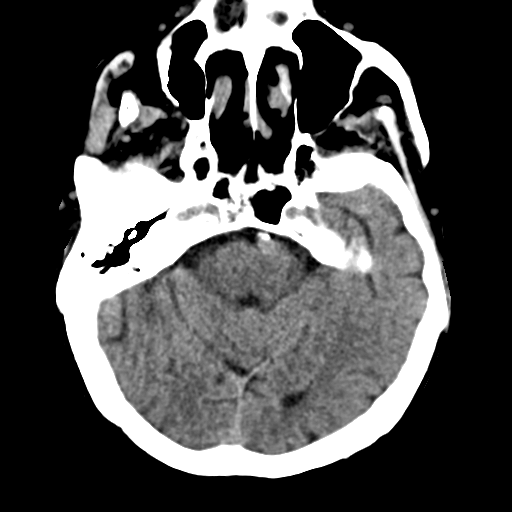
[im 14/34  brain]
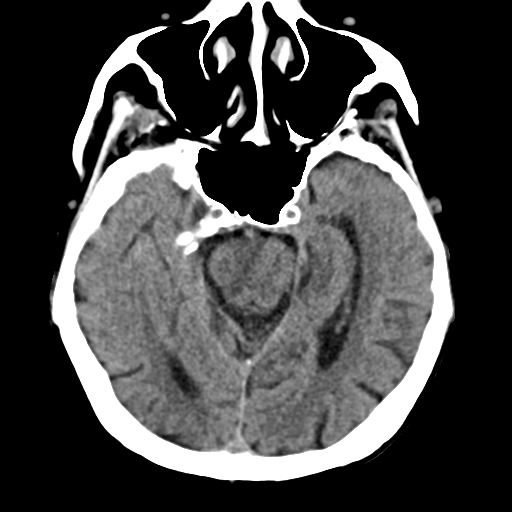
[im 16/34  brain]
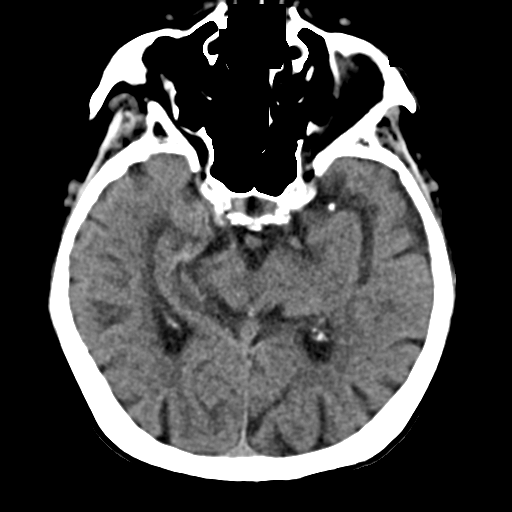
[im 18/34  brain]
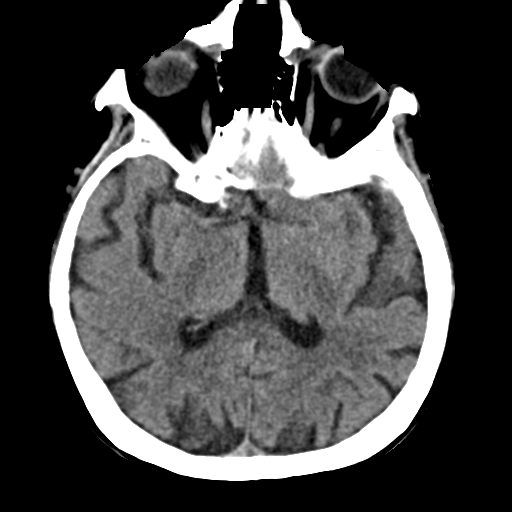
[im 18/34  bone]
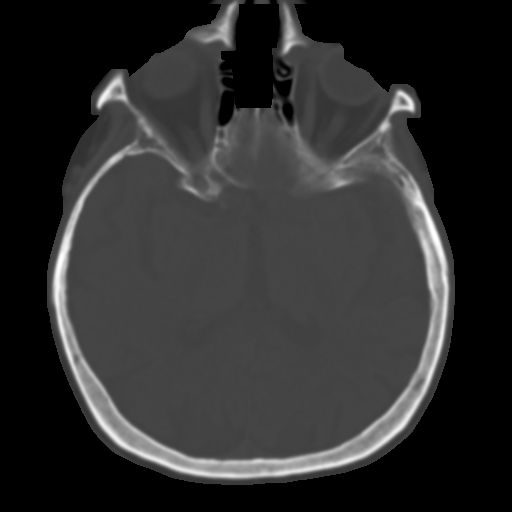
[im 20/34  brain]
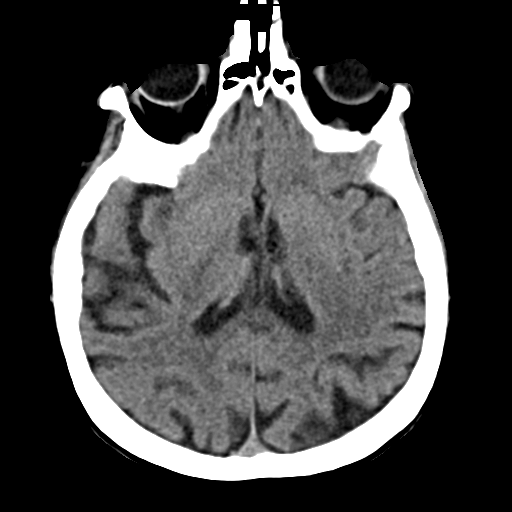
[im 22/34  brain]
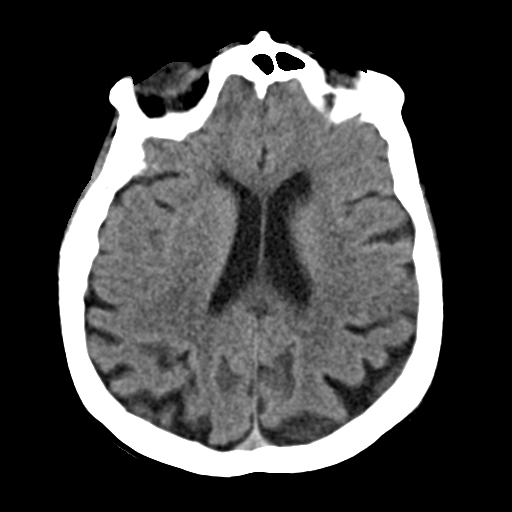
[im 24/34  brain]
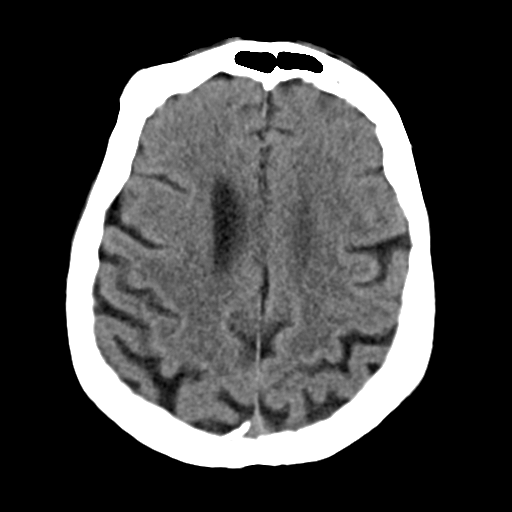
[im 26/34  brain]
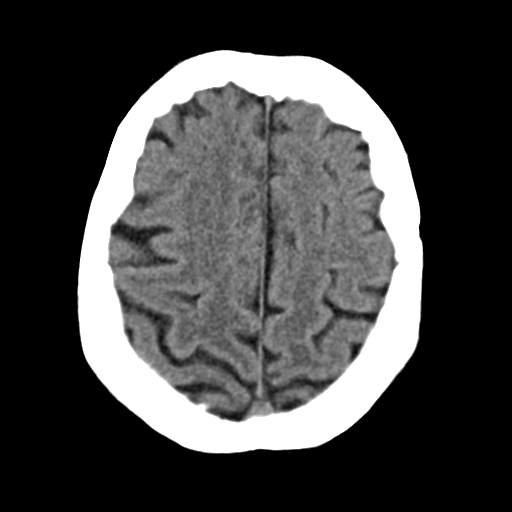
[im 26/34  bone]
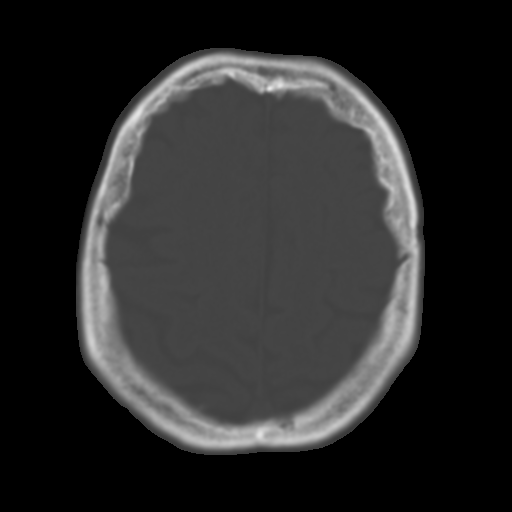
[im 28/34  brain]
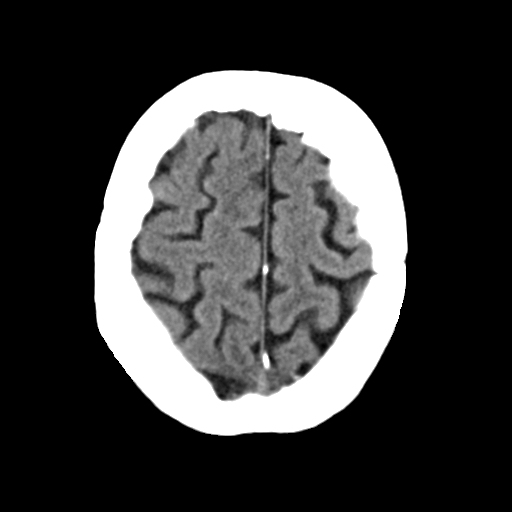
[im 30/34  brain]
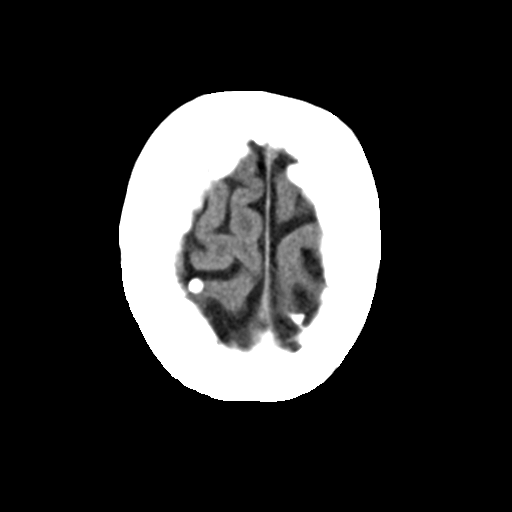
[im 32/34  brain]
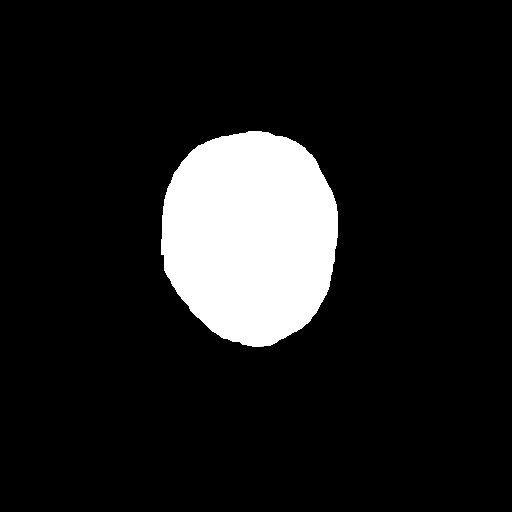

[16 of 30 positions shown; findings below may reference images not displayed]

FINDINGS: Brain: No evidence of acute infarction, hemorrhage, hydrocephalus,
extra-axial collection or mass lesion/mass effect. Prominence of the
sulci identified compatible with brain atrophy.

Vascular: No hyperdense vessel or unexpected calcification.

Skull: Normal. Negative for fracture or focal lesion.

Sinuses/Orbits: No acute finding.

Other: None
IMPRESSION: 1. No acute intracranial abnormality.
2. Mild brain atrophy.

## 2021-08-06 DIAGNOSIS — S82841D Displaced bimalleolar fracture of right lower leg, subsequent encounter for closed fracture with routine healing: Secondary | ICD-10-CM | POA: Diagnosis not present

## 2021-08-06 DIAGNOSIS — Z952 Presence of prosthetic heart valve: Secondary | ICD-10-CM | POA: Diagnosis not present

## 2021-08-06 DIAGNOSIS — I482 Chronic atrial fibrillation, unspecified: Secondary | ICD-10-CM | POA: Diagnosis not present

## 2021-08-06 DIAGNOSIS — S82891D Other fracture of right lower leg, subsequent encounter for closed fracture with routine healing: Secondary | ICD-10-CM | POA: Diagnosis not present

## 2021-08-06 DIAGNOSIS — Z7901 Long term (current) use of anticoagulants: Secondary | ICD-10-CM | POA: Diagnosis not present

## 2021-08-24 DIAGNOSIS — I482 Chronic atrial fibrillation, unspecified: Secondary | ICD-10-CM | POA: Diagnosis not present

## 2021-08-24 DIAGNOSIS — Z952 Presence of prosthetic heart valve: Secondary | ICD-10-CM | POA: Diagnosis not present

## 2021-08-24 DIAGNOSIS — Z7901 Long term (current) use of anticoagulants: Secondary | ICD-10-CM | POA: Diagnosis not present

## 2021-08-29 ENCOUNTER — Telehealth: Payer: Self-pay | Admitting: Internal Medicine

## 2021-08-29 DIAGNOSIS — Z8673 Personal history of transient ischemic attack (TIA), and cerebral infarction without residual deficits: Secondary | ICD-10-CM | POA: Diagnosis not present

## 2021-08-29 DIAGNOSIS — M25571 Pain in right ankle and joints of right foot: Secondary | ICD-10-CM | POA: Diagnosis not present

## 2021-08-29 DIAGNOSIS — R2689 Other abnormalities of gait and mobility: Secondary | ICD-10-CM | POA: Diagnosis not present

## 2021-08-29 DIAGNOSIS — I509 Heart failure, unspecified: Secondary | ICD-10-CM | POA: Diagnosis not present

## 2021-08-29 DIAGNOSIS — S82891D Other fracture of right lower leg, subsequent encounter for closed fracture with routine healing: Secondary | ICD-10-CM | POA: Diagnosis not present

## 2021-08-29 DIAGNOSIS — R531 Weakness: Secondary | ICD-10-CM | POA: Diagnosis not present

## 2021-08-29 DIAGNOSIS — I2510 Atherosclerotic heart disease of native coronary artery without angina pectoris: Secondary | ICD-10-CM | POA: Diagnosis not present

## 2021-08-29 DIAGNOSIS — Z951 Presence of aortocoronary bypass graft: Secondary | ICD-10-CM | POA: Diagnosis not present

## 2021-08-29 NOTE — Telephone Encounter (Signed)
1.Medication Requested: escitalopram (LEXAPRO) 5 MG tablet ? ?2. Pharmacy (Name, [facility], [place]): [facility], [place] ? ?3. On Med List: Y ? ?4. Last Visit with PCP: 07-02-2021 ? ?5. Next visit date with PCP: n/a ? ? ?Agent: Please be advised that RX refills may take up to 3 business days. We ask that you follow-up with your pharmacy.  ?

## 2021-09-01 MED ORDER — ESCITALOPRAM OXALATE 5 MG PO TABS: 5.0000 mg | ORAL_TABLET | Freq: Every day | ORAL | 1 refills | Status: DC

## 2021-09-01 NOTE — Telephone Encounter (Signed)
Okay.  Done.  Thanks 

## 2021-09-04 ENCOUNTER — Other Ambulatory Visit: Payer: Self-pay | Admitting: Internal Medicine

## 2021-09-11 ENCOUNTER — Telehealth: Payer: Self-pay | Admitting: *Deleted

## 2021-09-11 DIAGNOSIS — S82891D Other fracture of right lower leg, subsequent encounter for closed fracture with routine healing: Secondary | ICD-10-CM | POA: Diagnosis not present

## 2021-09-11 DIAGNOSIS — M79621 Pain in right upper arm: Secondary | ICD-10-CM | POA: Diagnosis not present

## 2021-09-11 DIAGNOSIS — S62101D Fracture of unspecified carpal bone, right wrist, subsequent encounter for fracture with routine healing: Secondary | ICD-10-CM | POA: Diagnosis not present

## 2021-09-11 DIAGNOSIS — M19011 Primary osteoarthritis, right shoulder: Secondary | ICD-10-CM | POA: Diagnosis not present

## 2021-09-11 NOTE — Telephone Encounter (Signed)
Rec'd fax from [facility] needing last ov notes faxed to [phone]. Faxed last 2 ov notes.Marland KitchenJohny Chess ?

## 2021-09-11 NOTE — Telephone Encounter (Signed)
Rec'd PA for Tramadol '50mg'$ .. completed on cover-my-meds w/(Key: [id number]). Rec;d msg stating " Your information has been sent to [place] [employer]." Will check later for determination.../lmb ?

## 2021-09-12 DIAGNOSIS — Z951 Presence of aortocoronary bypass graft: Secondary | ICD-10-CM | POA: Diagnosis not present

## 2021-09-12 DIAGNOSIS — R531 Weakness: Secondary | ICD-10-CM | POA: Diagnosis not present

## 2021-09-12 DIAGNOSIS — R2689 Other abnormalities of gait and mobility: Secondary | ICD-10-CM | POA: Diagnosis not present

## 2021-09-12 DIAGNOSIS — I2510 Atherosclerotic heart disease of native coronary artery without angina pectoris: Secondary | ICD-10-CM | POA: Diagnosis not present

## 2021-09-12 DIAGNOSIS — M25571 Pain in right ankle and joints of right foot: Secondary | ICD-10-CM | POA: Diagnosis not present

## 2021-09-12 DIAGNOSIS — I509 Heart failure, unspecified: Secondary | ICD-10-CM | POA: Diagnosis not present

## 2021-09-12 DIAGNOSIS — Z8673 Personal history of transient ischemic attack (TIA), and cerebral infarction without residual deficits: Secondary | ICD-10-CM | POA: Diagnosis not present

## 2021-09-12 DIAGNOSIS — S82891D Other fracture of right lower leg, subsequent encounter for closed fracture with routine healing: Secondary | ICD-10-CM | POA: Diagnosis not present

## 2021-09-12 NOTE — Telephone Encounter (Signed)
Rec'd msg on cover-my-meds PA was " DENIED " It states did not see certain details about your treatment. Copy of denial letter was mailed to pt..Alexis [name] ?

## 2021-09-14 DIAGNOSIS — M25571 Pain in right ankle and joints of right foot: Secondary | ICD-10-CM | POA: Diagnosis not present

## 2021-09-14 DIAGNOSIS — Z8673 Personal history of transient ischemic attack (TIA), and cerebral infarction without residual deficits: Secondary | ICD-10-CM | POA: Diagnosis not present

## 2021-09-14 DIAGNOSIS — S82891D Other fracture of right lower leg, subsequent encounter for closed fracture with routine healing: Secondary | ICD-10-CM | POA: Diagnosis not present

## 2021-09-14 DIAGNOSIS — Z951 Presence of aortocoronary bypass graft: Secondary | ICD-10-CM | POA: Diagnosis not present

## 2021-09-14 DIAGNOSIS — I2510 Atherosclerotic heart disease of native coronary artery without angina pectoris: Secondary | ICD-10-CM | POA: Diagnosis not present

## 2021-09-14 DIAGNOSIS — R2689 Other abnormalities of gait and mobility: Secondary | ICD-10-CM | POA: Diagnosis not present

## 2021-09-14 DIAGNOSIS — I509 Heart failure, unspecified: Secondary | ICD-10-CM | POA: Diagnosis not present

## 2021-09-14 DIAGNOSIS — R531 Weakness: Secondary | ICD-10-CM | POA: Diagnosis not present

## 2021-09-18 DIAGNOSIS — Z8673 Personal history of transient ischemic attack (TIA), and cerebral infarction without residual deficits: Secondary | ICD-10-CM | POA: Diagnosis not present

## 2021-09-18 DIAGNOSIS — R531 Weakness: Secondary | ICD-10-CM | POA: Diagnosis not present

## 2021-09-18 DIAGNOSIS — Z951 Presence of aortocoronary bypass graft: Secondary | ICD-10-CM | POA: Diagnosis not present

## 2021-09-18 DIAGNOSIS — I2510 Atherosclerotic heart disease of native coronary artery without angina pectoris: Secondary | ICD-10-CM | POA: Diagnosis not present

## 2021-09-18 DIAGNOSIS — I509 Heart failure, unspecified: Secondary | ICD-10-CM | POA: Diagnosis not present

## 2021-09-18 DIAGNOSIS — S82891D Other fracture of right lower leg, subsequent encounter for closed fracture with routine healing: Secondary | ICD-10-CM | POA: Diagnosis not present

## 2021-09-18 DIAGNOSIS — M25571 Pain in right ankle and joints of right foot: Secondary | ICD-10-CM | POA: Diagnosis not present

## 2021-09-18 DIAGNOSIS — R2689 Other abnormalities of gait and mobility: Secondary | ICD-10-CM | POA: Diagnosis not present

## 2021-09-19 DIAGNOSIS — Z952 Presence of prosthetic heart valve: Secondary | ICD-10-CM | POA: Diagnosis not present

## 2021-09-19 DIAGNOSIS — I482 Chronic atrial fibrillation, unspecified: Secondary | ICD-10-CM | POA: Diagnosis not present

## 2021-09-19 DIAGNOSIS — Z7901 Long term (current) use of anticoagulants: Secondary | ICD-10-CM | POA: Diagnosis not present

## 2021-09-20 DIAGNOSIS — R531 Weakness: Secondary | ICD-10-CM | POA: Diagnosis not present

## 2021-09-20 DIAGNOSIS — Z951 Presence of aortocoronary bypass graft: Secondary | ICD-10-CM | POA: Diagnosis not present

## 2021-09-20 DIAGNOSIS — Z8673 Personal history of transient ischemic attack (TIA), and cerebral infarction without residual deficits: Secondary | ICD-10-CM | POA: Diagnosis not present

## 2021-09-20 DIAGNOSIS — I509 Heart failure, unspecified: Secondary | ICD-10-CM | POA: Diagnosis not present

## 2021-09-20 DIAGNOSIS — I2510 Atherosclerotic heart disease of native coronary artery without angina pectoris: Secondary | ICD-10-CM | POA: Diagnosis not present

## 2021-09-20 DIAGNOSIS — S82891D Other fracture of right lower leg, subsequent encounter for closed fracture with routine healing: Secondary | ICD-10-CM | POA: Diagnosis not present

## 2021-09-20 DIAGNOSIS — R2689 Other abnormalities of gait and mobility: Secondary | ICD-10-CM | POA: Diagnosis not present

## 2021-09-20 DIAGNOSIS — M25571 Pain in right ankle and joints of right foot: Secondary | ICD-10-CM | POA: Diagnosis not present

## 2021-09-24 DIAGNOSIS — M6281 Muscle weakness (generalized): Secondary | ICD-10-CM | POA: Diagnosis not present

## 2021-09-24 DIAGNOSIS — S82891D Other fracture of right lower leg, subsequent encounter for closed fracture with routine healing: Secondary | ICD-10-CM | POA: Diagnosis not present

## 2021-09-24 DIAGNOSIS — R2689 Other abnormalities of gait and mobility: Secondary | ICD-10-CM | POA: Diagnosis not present

## 2021-10-09 ENCOUNTER — Other Ambulatory Visit: Payer: Self-pay | Admitting: Internal Medicine

## 2021-10-17 DIAGNOSIS — M19072 Primary osteoarthritis, left ankle and foot: Secondary | ICD-10-CM | POA: Diagnosis not present

## 2021-10-17 DIAGNOSIS — M79672 Pain in left foot: Secondary | ICD-10-CM | POA: Diagnosis not present

## 2021-10-17 DIAGNOSIS — M2012 Hallux valgus (acquired), left foot: Secondary | ICD-10-CM | POA: Diagnosis not present

## 2021-10-18 DIAGNOSIS — I482 Chronic atrial fibrillation, unspecified: Secondary | ICD-10-CM | POA: Diagnosis not present

## 2021-10-18 DIAGNOSIS — Z952 Presence of prosthetic heart valve: Secondary | ICD-10-CM | POA: Diagnosis not present

## 2021-10-18 DIAGNOSIS — Z7901 Long term (current) use of anticoagulants: Secondary | ICD-10-CM | POA: Diagnosis not present

## 2021-11-07 DIAGNOSIS — I482 Chronic atrial fibrillation, unspecified: Secondary | ICD-10-CM | POA: Diagnosis not present

## 2021-11-07 DIAGNOSIS — Z952 Presence of prosthetic heart valve: Secondary | ICD-10-CM | POA: Diagnosis not present

## 2021-11-07 DIAGNOSIS — Z7901 Long term (current) use of anticoagulants: Secondary | ICD-10-CM | POA: Diagnosis not present

## 2021-11-11 DIAGNOSIS — S52602A Unspecified fracture of lower end of left ulna, initial encounter for closed fracture: Secondary | ICD-10-CM | POA: Diagnosis not present

## 2021-11-11 DIAGNOSIS — Z8673 Personal history of transient ischemic attack (TIA), and cerebral infarction without residual deficits: Secondary | ICD-10-CM | POA: Diagnosis not present

## 2021-11-11 DIAGNOSIS — Z6834 Body mass index (BMI) 34.0-34.9, adult: Secondary | ICD-10-CM | POA: Diagnosis not present

## 2021-11-11 DIAGNOSIS — I2510 Atherosclerotic heart disease of native coronary artery without angina pectoris: Secondary | ICD-10-CM | POA: Diagnosis not present

## 2021-11-11 DIAGNOSIS — S52612A Displaced fracture of left ulna styloid process, initial encounter for closed fracture: Secondary | ICD-10-CM | POA: Diagnosis not present

## 2021-11-11 DIAGNOSIS — S52532A Colles' fracture of left radius, initial encounter for closed fracture: Secondary | ICD-10-CM | POA: Diagnosis not present

## 2021-11-11 DIAGNOSIS — I499 Cardiac arrhythmia, unspecified: Secondary | ICD-10-CM | POA: Diagnosis not present

## 2021-11-11 DIAGNOSIS — I10 Essential (primary) hypertension: Secondary | ICD-10-CM | POA: Diagnosis not present

## 2021-11-11 DIAGNOSIS — R296 Repeated falls: Secondary | ICD-10-CM | POA: Diagnosis not present

## 2021-11-11 DIAGNOSIS — I482 Chronic atrial fibrillation, unspecified: Secondary | ICD-10-CM | POA: Diagnosis not present

## 2021-11-11 DIAGNOSIS — Z79899 Other long term (current) drug therapy: Secondary | ICD-10-CM | POA: Diagnosis not present

## 2021-11-11 DIAGNOSIS — E669 Obesity, unspecified: Secondary | ICD-10-CM | POA: Diagnosis not present

## 2021-11-11 DIAGNOSIS — S52592A Other fractures of lower end of left radius, initial encounter for closed fracture: Secondary | ICD-10-CM | POA: Diagnosis not present

## 2021-11-11 DIAGNOSIS — I4891 Unspecified atrial fibrillation: Secondary | ICD-10-CM | POA: Diagnosis not present

## 2021-11-11 DIAGNOSIS — Z952 Presence of prosthetic heart valve: Secondary | ICD-10-CM | POA: Diagnosis not present

## 2021-11-11 DIAGNOSIS — Z7901 Long term (current) use of anticoagulants: Secondary | ICD-10-CM | POA: Diagnosis not present

## 2021-11-11 DIAGNOSIS — S52502A Unspecified fracture of the lower end of left radius, initial encounter for closed fracture: Secondary | ICD-10-CM | POA: Diagnosis not present

## 2021-11-12 DIAGNOSIS — M25532 Pain in left wrist: Secondary | ICD-10-CM | POA: Diagnosis not present

## 2021-11-12 DIAGNOSIS — S52572A Other intraarticular fracture of lower end of left radius, initial encounter for closed fracture: Secondary | ICD-10-CM | POA: Diagnosis not present

## 2021-11-13 DIAGNOSIS — S52572A Other intraarticular fracture of lower end of left radius, initial encounter for closed fracture: Secondary | ICD-10-CM | POA: Diagnosis not present

## 2021-11-20 DIAGNOSIS — S52572A Other intraarticular fracture of lower end of left radius, initial encounter for closed fracture: Secondary | ICD-10-CM | POA: Diagnosis not present

## 2021-11-20 DIAGNOSIS — M19011 Primary osteoarthritis, right shoulder: Secondary | ICD-10-CM | POA: Diagnosis not present

## 2021-11-20 DIAGNOSIS — S52572D Other intraarticular fracture of lower end of left radius, subsequent encounter for closed fracture with routine healing: Secondary | ICD-10-CM | POA: Diagnosis not present

## 2021-11-20 DIAGNOSIS — Z952 Presence of prosthetic heart valve: Secondary | ICD-10-CM | POA: Diagnosis not present

## 2021-11-20 DIAGNOSIS — S52602D Unspecified fracture of lower end of left ulna, subsequent encounter for closed fracture with routine healing: Secondary | ICD-10-CM | POA: Diagnosis not present

## 2021-11-20 DIAGNOSIS — Z951 Presence of aortocoronary bypass graft: Secondary | ICD-10-CM | POA: Diagnosis not present

## 2021-11-20 DIAGNOSIS — M25511 Pain in right shoulder: Secondary | ICD-10-CM | POA: Diagnosis not present

## 2021-11-29 DIAGNOSIS — S52572A Other intraarticular fracture of lower end of left radius, initial encounter for closed fracture: Secondary | ICD-10-CM | POA: Diagnosis not present

## 2021-11-29 DIAGNOSIS — S52572D Other intraarticular fracture of lower end of left radius, subsequent encounter for closed fracture with routine healing: Secondary | ICD-10-CM | POA: Diagnosis not present

## 2021-11-29 DIAGNOSIS — S52602D Unspecified fracture of lower end of left ulna, subsequent encounter for closed fracture with routine healing: Secondary | ICD-10-CM | POA: Diagnosis not present

## 2021-12-06 DIAGNOSIS — Z7901 Long term (current) use of anticoagulants: Secondary | ICD-10-CM | POA: Diagnosis not present

## 2021-12-06 DIAGNOSIS — Z952 Presence of prosthetic heart valve: Secondary | ICD-10-CM | POA: Diagnosis not present

## 2021-12-06 DIAGNOSIS — I482 Chronic atrial fibrillation, unspecified: Secondary | ICD-10-CM | POA: Diagnosis not present

## 2021-12-10 DIAGNOSIS — S52572A Other intraarticular fracture of lower end of left radius, initial encounter for closed fracture: Secondary | ICD-10-CM | POA: Diagnosis not present

## 2021-12-10 DIAGNOSIS — S52572D Other intraarticular fracture of lower end of left radius, subsequent encounter for closed fracture with routine healing: Secondary | ICD-10-CM | POA: Diagnosis not present

## 2021-12-10 DIAGNOSIS — S52602D Unspecified fracture of lower end of left ulna, subsequent encounter for closed fracture with routine healing: Secondary | ICD-10-CM | POA: Diagnosis not present

## 2021-12-17 DIAGNOSIS — S52572D Other intraarticular fracture of lower end of left radius, subsequent encounter for closed fracture with routine healing: Secondary | ICD-10-CM | POA: Diagnosis not present

## 2021-12-31 DIAGNOSIS — I482 Chronic atrial fibrillation, unspecified: Secondary | ICD-10-CM | POA: Diagnosis not present

## 2021-12-31 DIAGNOSIS — Z7901 Long term (current) use of anticoagulants: Secondary | ICD-10-CM | POA: Diagnosis not present

## 2021-12-31 DIAGNOSIS — Z952 Presence of prosthetic heart valve: Secondary | ICD-10-CM | POA: Diagnosis not present

## 2022-01-02 ENCOUNTER — Encounter (INDEPENDENT_AMBULATORY_CARE_PROVIDER_SITE_OTHER): Payer: Self-pay

## 2022-01-08 DIAGNOSIS — M25532 Pain in left wrist: Secondary | ICD-10-CM | POA: Diagnosis not present

## 2022-01-08 DIAGNOSIS — R293 Abnormal posture: Secondary | ICD-10-CM | POA: Diagnosis not present

## 2022-01-08 DIAGNOSIS — M25632 Stiffness of left wrist, not elsewhere classified: Secondary | ICD-10-CM | POA: Diagnosis not present

## 2022-01-08 DIAGNOSIS — R6889 Other general symptoms and signs: Secondary | ICD-10-CM | POA: Diagnosis not present

## 2022-01-08 DIAGNOSIS — M6281 Muscle weakness (generalized): Secondary | ICD-10-CM | POA: Diagnosis not present

## 2022-01-17 ENCOUNTER — Encounter: Payer: Self-pay | Admitting: Internal Medicine

## 2022-01-17 ENCOUNTER — Ambulatory Visit: Payer: Medicaid Other | Admitting: Internal Medicine

## 2022-01-17 DIAGNOSIS — L309 Dermatitis, unspecified: Secondary | ICD-10-CM

## 2022-01-17 DIAGNOSIS — M79672 Pain in left foot: Secondary | ICD-10-CM

## 2022-01-17 DIAGNOSIS — I4821 Permanent atrial fibrillation: Secondary | ICD-10-CM | POA: Diagnosis not present

## 2022-01-17 DIAGNOSIS — R21 Rash and other nonspecific skin eruption: Secondary | ICD-10-CM | POA: Diagnosis not present

## 2022-01-17 LAB — COMPREHENSIVE METABOLIC PANEL
ALT: 9 U/L (ref 0–35)
AST: 17 U/L (ref 0–37)
Albumin: 3.8 g/dL (ref 3.5–5.2)
Alkaline Phosphatase: 92 U/L (ref 39–117)
BUN: 22 mg/dL (ref 6–23)
CO2: 24 mEq/L (ref 19–32)
Calcium: 9.4 mg/dL (ref 8.4–10.5)
Chloride: 100 mEq/L (ref 96–112)
Creatinine, Ser: 1.23 mg/dL — ABNORMAL HIGH (ref 0.40–1.20)
GFR: 40 mL/min — ABNORMAL LOW (ref >60.00)
Glucose, Bld: 101 mg/dL — ABNORMAL HIGH (ref 70–99)
Potassium: 3.7 mEq/L (ref 3.5–5.1)
Sodium: 134 mEq/L — ABNORMAL LOW (ref 135–145)
Total Bilirubin: 0.6 mg/dL (ref 0.2–1.2)
Total Protein: 8.1 g/dL (ref 6.0–8.3)

## 2022-01-17 LAB — URIC ACID: Uric Acid, Serum: 8.4 mg/dL — ABNORMAL HIGH (ref 2.4–7.0)

## 2022-01-17 MED ORDER — METHYLPREDNISOLONE 4 MG PO TBPK: ORAL_TABLET | ORAL | 2 refills | Status: DC

## 2022-01-17 NOTE — Assessment & Plan Note (Signed)
Worse Medrol dose pack Triamcinolone bid

## 2022-01-17 NOTE — Assessment & Plan Note (Addendum)
Worse Probable gout, recurrent Medrol pack Tramadol prn  Labs w/uric acid

## 2022-01-17 NOTE — Progress Notes (Signed)
Subjective:  Patient ID: Alexis [name], female    DOB: 08-29-1933  Age: 88 y.o. MRN: [id number]  CC: Foot Swelling ((L) foot pain and swelling. Daughter states can't stand on foot) and Rash (Skin problem.. breaking out in rash)   HPI Alexis [name] presents for severe L foot pain since Sunday, daily pain, worse in the morning. L foot X ray - no fx in 5/23 at [facility].  Outpatient Medications Prior to Visit  Medication Sig Dispense Refill   acetaminophen (TYLENOL) 500 MG tablet Take 500 mg by mouth every 6 (six) hours as needed.     atorvastatin (LIPITOR) 10 MG tablet TAKE 1 TABLET BY MOUTH AT BEDTIME 90 tablet 2   b complex vitamins tablet Take 1 tablet by mouth daily. 100 tablet 3   benzonatate (TESSALON) 100 MG capsule Take 1 capsule (100 mg total) by mouth 3 (three) times daily as needed. 30 capsule 0   Cholecalciferol (VITAMIN D3) 2000 units capsule Take 1 capsule (2,000 Units total) by mouth daily. 100 capsule 3   escitalopram (LEXAPRO) 5 MG tablet Take 1 tablet (5 mg total) by mouth daily. 90 tablet 1   furosemide (LASIX) 40 MG tablet Take by mouth. Take 1 Twice a day     IBUPROFEN PO Take by mouth.     metoprolol succinate (TOPROL-XL) 50 MG 24 hr tablet TAKE 1 TABLET BY MOUTH ONCE DAILY WITH  OR  IMMEDIATELY  FOLLOWING  A  MEAL 90 tablet 1   mineral oil-hydrophilic petrolatum (AQUAPHOR) ointment Apply topically as needed for dry skin. 420 g 3   potassium chloride SA (KLOR-CON) 20 MEQ tablet Take 1 tablet (20 mEq total) by mouth daily. 90 tablet 1   traMADol (ULTRAM) 50 MG tablet Take 1-2 tablets (50-100 mg total) by mouth every 12 (twelve) hours as needed. 120 tablet 1   triamcinolone cream (KENALOG) 0.1 % Apply 1 application topically 2 (two) times daily. 160 g 3   warfarin (COUMADIN) 2.5 MG tablet TAKE 1/2 TABLET BY MOUTH IN THE EVENING ON SUNDAY, TUESDAY, THURSDAY AND SATURDAY, TAKE 1 TABLET ON MONDAY, WEDNESDAY AND FRIDAY 90 tablet 3   No facility-administered  medications prior to visit.    ROS: Review of Systems  Respiratory:  Positive for shortness of breath.   Musculoskeletal:  Positive for arthralgias and gait problem.    Objective:  BP 130/84 (BP Location: Left Arm)   Pulse 71   Temp 98 F (36.7 C) (Oral)   Ht '4\' 10"'$  (1.473 m)   Wt 169 lb 3.2 oz (76.7 kg)   SpO2 93%   BMI 35.36 kg/m   BP Readings from Last 3 Encounters:  01/17/22 130/84  07/02/21 118/68  01/22/21 112/76    Wt Readings from Last 3 Encounters:  01/17/22 169 lb 3.2 oz (76.7 kg)  01/10/21 172 lb 9.6 oz (78.3 kg)  09/14/20 170 lb 9.6 oz (77.4 kg)    Physical Exam Constitutional:      General: She is not in acute distress.    Appearance: She is well-developed. She is obese.  HENT:     Head: Normocephalic.     Right Ear: External ear normal.     Left Ear: External ear normal.     Nose: Nose normal.  Eyes:     General:        Right eye: No discharge.        Left eye: No discharge.     Conjunctiva/sclera: Conjunctivae normal.  Pupils: Pupils are equal, round, and reactive to light.  Neck:     Thyroid: No thyromegaly.     Vascular: No JVD.     Trachea: No tracheal deviation.  Cardiovascular:     Rate and Rhythm: Normal rate and regular rhythm.     Heart sounds: Normal heart sounds.  Pulmonary:     Effort: No respiratory distress.     Breath sounds: No stridor. No wheezing.  Abdominal:     General: Bowel sounds are normal. There is no distension.     Palpations: Abdomen is soft. There is no mass.     Tenderness: There is no abdominal tenderness. There is no guarding or rebound.  Musculoskeletal:        General: Tenderness present.     Cervical back: Normal range of motion and neck supple. No rigidity.     Right lower leg: No edema.     Left lower leg: No edema.  Lymphadenopathy:     Cervical: No cervical adenopathy.  Skin:    Findings: No erythema or rash.  Neurological:     Mental Status: She is oriented to person, place, and time.      Cranial Nerves: No cranial nerve deficit.     Motor: No abnormal muscle tone.     Coordination: Coordination normal.     Deep Tendon Reflexes: Reflexes normal.  Psychiatric:        Behavior: Behavior normal.        Thought Content: Thought content normal.        Judgment: Judgment normal.   B valgus L mid-foot is tender In a w/c  Lab Results  Component Value Date   WBC 7.1 01/10/2021   HGB 12.4 01/10/2021   HCT 36.6 01/10/2021   PLT 261.0 01/10/2021   GLUCOSE 96 01/10/2021   CHOL 173 01/10/2021   TRIG 305.0 (H) 01/10/2021   HDL 41.40 01/10/2021   LDLDIRECT 90.0 01/10/2021   LDLCALC 91 05/04/2018   ALT 12 01/10/2021   AST 19 01/10/2021   NA 138 01/10/2021   K 4.1 01/10/2021   CL 104 01/10/2021   CREATININE 1.49 (H) 01/10/2021   BUN 25 (H) 01/10/2021   CO2 25 01/10/2021   TSH 2.51 01/10/2021   INR 2.7 (A) 12/27/2019   HGBA1C 6.0 08/16/2019    CT ANKLE RIGHT WO CONTRAST  Result Date: 06/15/2021 CLINICAL DATA:  Evaluate right ankle fracture EXAM: CT OF THE RIGHT ANKLE WITHOUT CONTRAST TECHNIQUE: Multidetector CT imaging of the right ankle was performed according to the standard protocol. Multiplanar CT image reconstructions were also generated. RADIATION DOSE REDUCTION: This exam was performed according to the departmental dose-optimization program which includes automated exposure control, adjustment of the mA and/or kV according to patient size and/or use of iterative reconstruction technique. COMPARISON:  X-ray 06/11/2021 FINDINGS: Bones/Joint/Cartilage Acute comminuted nondisplaced fracture of the medial malleolus with horizontal and vertical components. Obliquely oriented nondisplaced fracture through the lateral malleolus. No posterior malleolar fracture component. Ankle mortise is congruent without widening or dislocation. Talus and calcaneus are intact. Subtalar joints are aligned. No acute fracture or malalignment within the midfoot. Moderate degenerative changes are most  pronounced at the second through fourth tarsometatarsal joints. Bones are demineralized. Ligaments Suboptimally assessed by CT. Muscles and Tendons No acute musculotendinous abnormality by CT. Soft tissues Soft tissue swelling and edema. Small hematoma overlies the lateral malleolar fracture site measuring approximately 3.6 x 0.9 cm. Atherosclerotic vascular calcifications are present. IMPRESSION: 1. Acute comminuted nondisplaced fractures  of the medial and lateral malleoli, as described above. 2. Small hematoma overlies the lateral malleolar fracture site. Electronically Signed   By: Davina Poke D.O.   On: 06/15/2021 08:42    Assessment & Plan:   Problem List Items Addressed This Visit     Atrial fibrillation (HCC)    On Coumadin      Eczema    Worse Medrol dose pack Triamcinolone bid      Foot pain, left    Worse Probable gout, recurrent Medrol pack Tramadol prn  Labs w/uric acid      Relevant Orders   Comprehensive metabolic panel   Uric acid   Rash    Worse Eczema Medrol pack         Meds ordered this encounter  Medications   methylPREDNISolone (MEDROL DOSEPAK) 4 MG TBPK tablet    Sig: As directed    Dispense:  21 tablet    Refill:  2      Follow-up: Return in about 3 months (around 04/19/2022) for a follow-up visit.  [name], MD

## 2022-01-17 NOTE — Assessment & Plan Note (Signed)
On Coumadin 

## 2022-01-17 NOTE — Assessment & Plan Note (Addendum)
Worse Eczema Medrol pack

## 2022-01-18 ENCOUNTER — Other Ambulatory Visit: Payer: Self-pay | Admitting: Internal Medicine

## 2022-01-18 MED ORDER — ALLOPURINOL 100 MG PO TABS: 50.0000 mg | ORAL_TABLET | Freq: Every day | ORAL | 2 refills | Status: DC

## 2022-01-24 DIAGNOSIS — Z7901 Long term (current) use of anticoagulants: Secondary | ICD-10-CM | POA: Diagnosis not present

## 2022-01-24 DIAGNOSIS — Z952 Presence of prosthetic heart valve: Secondary | ICD-10-CM | POA: Diagnosis not present

## 2022-01-24 DIAGNOSIS — I482 Chronic atrial fibrillation, unspecified: Secondary | ICD-10-CM | POA: Diagnosis not present

## 2022-01-30 ENCOUNTER — Other Ambulatory Visit: Payer: Self-pay | Admitting: Internal Medicine

## 2022-02-21 DIAGNOSIS — Z7901 Long term (current) use of anticoagulants: Secondary | ICD-10-CM | POA: Diagnosis not present

## 2022-02-21 DIAGNOSIS — I482 Chronic atrial fibrillation, unspecified: Secondary | ICD-10-CM | POA: Diagnosis not present

## 2022-02-21 DIAGNOSIS — Z952 Presence of prosthetic heart valve: Secondary | ICD-10-CM | POA: Diagnosis not present

## 2022-03-21 DIAGNOSIS — I340 Nonrheumatic mitral (valve) insufficiency: Secondary | ICD-10-CM | POA: Diagnosis not present

## 2022-03-21 DIAGNOSIS — Z952 Presence of prosthetic heart valve: Secondary | ICD-10-CM | POA: Diagnosis not present

## 2022-03-21 DIAGNOSIS — I482 Chronic atrial fibrillation, unspecified: Secondary | ICD-10-CM | POA: Diagnosis not present

## 2022-04-04 DIAGNOSIS — I482 Chronic atrial fibrillation, unspecified: Secondary | ICD-10-CM | POA: Diagnosis not present

## 2022-04-04 DIAGNOSIS — Z952 Presence of prosthetic heart valve: Secondary | ICD-10-CM | POA: Diagnosis not present

## 2022-04-04 DIAGNOSIS — Z7901 Long term (current) use of anticoagulants: Secondary | ICD-10-CM | POA: Diagnosis not present

## 2022-04-09 ENCOUNTER — Other Ambulatory Visit: Payer: Self-pay | Admitting: Internal Medicine

## 2022-04-10 MED ORDER — TRAMADOL HCL 50 MG PO TABS: 50.0000 mg | ORAL_TABLET | Freq: Two times a day (BID) | ORAL | 1 refills | Status: DC | PRN

## 2022-04-19 DIAGNOSIS — I482 Chronic atrial fibrillation, unspecified: Secondary | ICD-10-CM | POA: Diagnosis not present

## 2022-04-19 DIAGNOSIS — Z7901 Long term (current) use of anticoagulants: Secondary | ICD-10-CM | POA: Diagnosis not present

## 2022-04-19 DIAGNOSIS — Z952 Presence of prosthetic heart valve: Secondary | ICD-10-CM | POA: Diagnosis not present

## 2022-04-24 DIAGNOSIS — I482 Chronic atrial fibrillation, unspecified: Secondary | ICD-10-CM | POA: Diagnosis not present

## 2022-04-24 DIAGNOSIS — I2510 Atherosclerotic heart disease of native coronary artery without angina pectoris: Secondary | ICD-10-CM | POA: Diagnosis not present

## 2022-04-24 DIAGNOSIS — I340 Nonrheumatic mitral (valve) insufficiency: Secondary | ICD-10-CM | POA: Diagnosis not present

## 2022-04-24 DIAGNOSIS — Z7901 Long term (current) use of anticoagulants: Secondary | ICD-10-CM | POA: Diagnosis not present

## 2022-05-03 DIAGNOSIS — I340 Nonrheumatic mitral (valve) insufficiency: Secondary | ICD-10-CM | POA: Diagnosis not present

## 2022-05-08 DIAGNOSIS — Z7901 Long term (current) use of anticoagulants: Secondary | ICD-10-CM | POA: Diagnosis not present

## 2022-05-08 DIAGNOSIS — Z952 Presence of prosthetic heart valve: Secondary | ICD-10-CM | POA: Diagnosis not present

## 2022-05-08 DIAGNOSIS — I482 Chronic atrial fibrillation, unspecified: Secondary | ICD-10-CM | POA: Diagnosis not present

## 2022-05-15 ENCOUNTER — Other Ambulatory Visit: Payer: Self-pay | Admitting: Internal Medicine

## 2022-05-17 DIAGNOSIS — L299 Pruritus, unspecified: Secondary | ICD-10-CM | POA: Diagnosis not present

## 2022-05-17 DIAGNOSIS — L0100 Impetigo, unspecified: Secondary | ICD-10-CM | POA: Diagnosis not present

## 2022-05-17 DIAGNOSIS — L304 Erythema intertrigo: Secondary | ICD-10-CM | POA: Diagnosis not present

## 2022-05-17 DIAGNOSIS — L732 Hidradenitis suppurativa: Secondary | ICD-10-CM | POA: Diagnosis not present

## 2022-06-05 DIAGNOSIS — Z952 Presence of prosthetic heart valve: Secondary | ICD-10-CM | POA: Diagnosis not present

## 2022-06-05 DIAGNOSIS — Z7901 Long term (current) use of anticoagulants: Secondary | ICD-10-CM | POA: Diagnosis not present

## 2022-06-05 DIAGNOSIS — I482 Chronic atrial fibrillation, unspecified: Secondary | ICD-10-CM | POA: Diagnosis not present

## 2022-06-14 DIAGNOSIS — L304 Erythema intertrigo: Secondary | ICD-10-CM | POA: Diagnosis not present

## 2022-06-14 DIAGNOSIS — L0100 Impetigo, unspecified: Secondary | ICD-10-CM | POA: Diagnosis not present

## 2022-06-19 DIAGNOSIS — Z952 Presence of prosthetic heart valve: Secondary | ICD-10-CM | POA: Diagnosis not present

## 2022-06-19 DIAGNOSIS — I482 Chronic atrial fibrillation, unspecified: Secondary | ICD-10-CM | POA: Diagnosis not present

## 2022-06-19 DIAGNOSIS — Z7901 Long term (current) use of anticoagulants: Secondary | ICD-10-CM | POA: Diagnosis not present

## 2022-07-03 DIAGNOSIS — Z7901 Long term (current) use of anticoagulants: Secondary | ICD-10-CM | POA: Diagnosis not present

## 2022-07-03 DIAGNOSIS — Z952 Presence of prosthetic heart valve: Secondary | ICD-10-CM | POA: Diagnosis not present

## 2022-07-03 DIAGNOSIS — I482 Chronic atrial fibrillation, unspecified: Secondary | ICD-10-CM | POA: Diagnosis not present

## 2022-07-10 ENCOUNTER — Ambulatory Visit: Payer: Medicaid Other | Admitting: Internal Medicine

## 2022-07-10 ENCOUNTER — Encounter: Payer: Self-pay | Admitting: Internal Medicine

## 2022-07-10 VITALS — BP 120/68 | HR 57 | Temp 98.7°F | Ht <= 58 in | Wt 169.0 lb

## 2022-07-10 DIAGNOSIS — I38 Endocarditis, valve unspecified: Secondary | ICD-10-CM

## 2022-07-10 DIAGNOSIS — R202 Paresthesia of skin: Secondary | ICD-10-CM | POA: Diagnosis not present

## 2022-07-10 DIAGNOSIS — E559 Vitamin D deficiency, unspecified: Secondary | ICD-10-CM | POA: Diagnosis not present

## 2022-07-10 DIAGNOSIS — G309 Alzheimer's disease, unspecified: Secondary | ICD-10-CM | POA: Diagnosis not present

## 2022-07-10 DIAGNOSIS — I2699 Other pulmonary embolism without acute cor pulmonale: Secondary | ICD-10-CM

## 2022-07-10 DIAGNOSIS — F0280 Dementia in other diseases classified elsewhere without behavioral disturbance: Secondary | ICD-10-CM

## 2022-07-10 LAB — COMPREHENSIVE METABOLIC PANEL
ALT: 16 U/L (ref 0–35)
AST: 24 U/L (ref 0–37)
Albumin: 3.8 g/dL (ref 3.5–5.2)
Alkaline Phosphatase: 124 U/L — ABNORMAL HIGH (ref 39–117)
BUN: 26 mg/dL — ABNORMAL HIGH (ref 6–23)
CO2: 26 mEq/L (ref 19–32)
Calcium: 9.7 mg/dL (ref 8.4–10.5)
Chloride: 100 mEq/L (ref 96–112)
Creatinine, Ser: 1.37 mg/dL — ABNORMAL HIGH (ref 0.40–1.20)
GFR: 35.03 mL/min — ABNORMAL LOW (ref >60.00)
Glucose, Bld: 95 mg/dL (ref 70–99)
Potassium: 4.1 mEq/L (ref 3.5–5.1)
Sodium: 135 mEq/L (ref 135–145)
Total Bilirubin: 0.5 mg/dL (ref 0.2–1.2)
Total Protein: 7.7 g/dL (ref 6.0–8.3)

## 2022-07-10 LAB — CBC WITH DIFFERENTIAL/PLATELET
Basophils Absolute: 0 10*3/uL (ref 0.0–0.1)
Basophils Relative: 0.3 % (ref 0.0–3.0)
Eosinophils Absolute: 0.1 10*3/uL (ref 0.0–0.7)
Eosinophils Relative: 1.5 % (ref 0.0–5.0)
HCT: 38.6 % (ref 36.0–46.0)
Hemoglobin: 13.2 g/dL (ref 12.0–15.0)
Lymphocytes Relative: 18.4 % (ref 12.0–46.0)
Lymphs Abs: 1.7 10*3/uL (ref 0.7–4.0)
MCHC: 34.2 g/dL (ref 30.0–36.0)
MCV: 88.7 fl (ref 78.0–100.0)
Monocytes Absolute: 0.9 10*3/uL (ref 0.1–1.0)
Monocytes Relative: 10.2 % (ref 3.0–12.0)
Neutro Abs: 6.3 10*3/uL (ref 1.4–7.7)
Neutrophils Relative %: 69.6 % (ref 43.0–77.0)
Platelets: 241 10*3/uL (ref 150.0–400.0)
RBC: 4.36 Mil/uL (ref 3.87–5.11)
RDW: 14 % (ref 11.5–15.5)
WBC: 9.1 10*3/uL (ref 4.0–10.5)

## 2022-07-10 LAB — TSH: TSH: 2.46 u[IU]/mL (ref 0.35–5.50)

## 2022-07-10 LAB — VITAMIN B12: Vitamin B-12: 393 pg/mL (ref 211–911)

## 2022-07-10 LAB — VITAMIN D 25 HYDROXY (VIT D DEFICIENCY, FRACTURES): VITD: 50.24 ng/mL (ref 30.00–100.00)

## 2022-07-10 NOTE — Progress Notes (Signed)
Subjective:  Patient ID: Alexis [name], female    DOB: Oct 18, 1933  Age: 89 y.o. MRN: [id number]  CC: Medication Problem   HPI Alexis [name] presents for CHF, Alzheimer's, elevated INR - 3.1, urinary urgency C/o urinary urgency She is here with her daughter who helps with history. Interview was conducted in [place] language    Outpatient Medications Prior to Visit  Medication Sig Dispense Refill   acetaminophen (TYLENOL) 500 MG tablet Take 500 mg by mouth every 6 (six) hours as needed.     allopurinol (ZYLOPRIM) 100 MG tablet Take 0.5 tablets (50 mg total) by mouth daily. 45 tablet 2   atorvastatin (LIPITOR) 10 MG tablet TAKE 1 TABLET BY MOUTH AT BEDTIME 90 tablet 1   b complex vitamins tablet Take 1 tablet by mouth daily. 100 tablet 3   benzonatate (TESSALON) 100 MG capsule Take 1 capsule (100 mg total) by mouth 3 (three) times daily as needed. 30 capsule 0   Cholecalciferol (VITAMIN D3) 2000 units capsule Take 1 capsule (2,000 Units total) by mouth daily. 100 capsule 3   escitalopram (LEXAPRO) 5 MG tablet Take 1 tablet by mouth once daily 90 tablet 1   furosemide (LASIX) 40 MG tablet Take by mouth. Take 1 Twice a day     IBUPROFEN PO Take by mouth.     methylPREDNISolone (MEDROL DOSEPAK) 4 MG TBPK tablet As directed 21 tablet 2   metoprolol succinate (TOPROL-XL) 50 MG 24 hr tablet TAKE 1 TABLET BY MOUTH ONCE DAILY WITH OR IMMEDIATELY FOLLOWING A MEAL 90 tablet 1   mineral oil-hydrophilic petrolatum (AQUAPHOR) ointment Apply topically as needed for dry skin. 420 g 3   potassium chloride SA (KLOR-CON) 20 MEQ tablet Take 1 tablet (20 mEq total) by mouth daily. 90 tablet 1   traMADol (ULTRAM) 50 MG tablet Take 1-2 tablets (50-100 mg total) by mouth every 12 (twelve) hours as needed. 120 tablet 1   triamcinolone cream (KENALOG) 0.1 % Apply 1 application topically 2 (two) times daily. 160 g 3   warfarin (COUMADIN) 2.5 MG tablet TAKE 1/2 TABLET BY MOUTH IN THE EVENING ON  SUNDAY, TUESDAY, THURSDAY AND SATURDAY, TAKE 1 TABLET ON MONDAY, WEDNESDAY AND FRIDAY 90 tablet 3   No facility-administered medications prior to visit.    ROS: Review of Systems  Constitutional:  Positive for fatigue. Negative for activity change, appetite change, chills and unexpected weight change.  HENT:  Negative for congestion, mouth sores and sinus pressure.   Eyes:  Negative for visual disturbance.  Respiratory:  Negative for cough and chest tightness.   Gastrointestinal:  Negative for abdominal pain and nausea.  Genitourinary:  Positive for frequency. Negative for difficulty urinating, urgency and vaginal pain.  Musculoskeletal:  Negative for back pain and gait problem.  Skin:  Negative for pallor and rash.  Neurological:  Negative for dizziness, tremors, weakness, numbness and headaches.  Psychiatric/Behavioral:  Positive for confusion and decreased concentration. Negative for hallucinations, sleep disturbance and suicidal ideas. The patient is not nervous/anxious.     Objective:  BP 120/68 (BP Location: Left Arm, Patient Position: Sitting, Cuff Size: Large)   Pulse (!) 57   Temp 98.7 F (37.1 C) (Oral)   Ht '4\' 10"'$  (1.473 m)   Wt 169 lb (76.7 kg)   SpO2 97%   BMI 35.32 kg/m   BP Readings from Last 3 Encounters:  07/10/22 120/68  01/17/22 130/84  07/02/21 118/68    Wt Readings from Last 3 Encounters:  07/10/22 169 lb (  76.7 kg)  01/17/22 169 lb 3.2 oz (76.7 kg)  01/10/21 172 lb 9.6 oz (78.3 kg)    Physical Exam Constitutional:      General: She is not in acute distress.    Appearance: She is well-developed. She is obese.  HENT:     Head: Normocephalic.     Right Ear: External ear normal.     Left Ear: External ear normal.     Nose: Nose normal.  Eyes:     General:        Right eye: No discharge.        Left eye: No discharge.     Conjunctiva/sclera: Conjunctivae normal.     Pupils: Pupils are equal, round, and reactive to light.  Neck:     Thyroid: No  thyromegaly.     Vascular: No JVD.     Trachea: No tracheal deviation.  Cardiovascular:     Rate and Rhythm: Normal rate and regular rhythm.     Heart sounds: Normal heart sounds.  Pulmonary:     Effort: No respiratory distress.     Breath sounds: No stridor. No wheezing.  Abdominal:     General: Bowel sounds are normal. There is no distension.     Palpations: Abdomen is soft. There is no mass.     Tenderness: There is no abdominal tenderness. There is no guarding or rebound.  Musculoskeletal:        General: No tenderness.     Cervical back: Normal range of motion and neck supple. No rigidity.  Lymphadenopathy:     Cervical: No cervical adenopathy.  Skin:    Findings: No erythema or rash.  Neurological:     Mental Status: She is disoriented.     Cranial Nerves: No cranial nerve deficit.     Motor: No abnormal muscle tone.     Coordination: Coordination abnormal.     Gait: Gait abnormal.     Deep Tendon Reflexes: Reflexes normal.  Psychiatric:        Behavior: Behavior normal.        Thought Content: Thought content normal.   She is in the wheelchair  MMSE 18/30 consistent with moderate dementia    A total time of 45 minutes was spent preparing to see the patient, reviewing tests, x-rays, operative reports and other medical records.  Also, obtaining history and performing comprehensive physical exam.  Additionally, counseling the patient regarding the above listed issues -Alzheimer's, valvular heart disease; peforming Mini-Mental state exam..   Finally, documenting clinical information in the health records, coordination of care, educating the patient and her daughter regarding irreversible changes with Alzheimer's disease.   Lab Results  Component Value Date   WBC 9.1 07/10/2022   HGB 13.2 07/10/2022   HCT 38.6 07/10/2022   PLT 241.0 07/10/2022   GLUCOSE 95 07/10/2022   CHOL 173 01/10/2021   TRIG 305.0 (H) 01/10/2021   HDL 41.40 01/10/2021   LDLDIRECT 90.0 01/10/2021    LDLCALC 91 05/04/2018   ALT 16 07/10/2022   AST 24 07/10/2022   NA 135 07/10/2022   K 4.1 07/10/2022   CL 100 07/10/2022   CREATININE 1.37 (H) 07/10/2022   BUN 26 (H) 07/10/2022   CO2 26 07/10/2022   TSH 2.46 07/10/2022   INR 2.7 (A) 12/27/2019   HGBA1C 6.0 08/16/2019    CT ANKLE RIGHT WO CONTRAST  Result Date: 06/15/2021 CLINICAL DATA:  Evaluate right ankle fracture EXAM: CT OF THE RIGHT ANKLE WITHOUT CONTRAST TECHNIQUE:  Multidetector CT imaging of the right ankle was performed according to the standard protocol. Multiplanar CT image reconstructions were also generated. RADIATION DOSE REDUCTION: This exam was performed according to the departmental dose-optimization program which includes automated exposure control, adjustment of the mA and/or kV according to patient size and/or use of iterative reconstruction technique. COMPARISON:  X-ray 06/11/2021 FINDINGS: Bones/Joint/Cartilage Acute comminuted nondisplaced fracture of the medial malleolus with horizontal and vertical components. Obliquely oriented nondisplaced fracture through the lateral malleolus. No posterior malleolar fracture component. Ankle mortise is congruent without widening or dislocation. Talus and calcaneus are intact. Subtalar joints are aligned. No acute fracture or malalignment within the midfoot. Moderate degenerative changes are most pronounced at the second through fourth tarsometatarsal joints. Bones are demineralized. Ligaments Suboptimally assessed by CT. Muscles and Tendons No acute musculotendinous abnormality by CT. Soft tissues Soft tissue swelling and edema. Small hematoma overlies the lateral malleolar fracture site measuring approximately 3.6 x 0.9 cm. Atherosclerotic vascular calcifications are present. IMPRESSION: 1. Acute comminuted nondisplaced fractures of the medial and lateral malleoli, as described above. 2. Small hematoma overlies the lateral malleolar fracture site. Electronically Signed   By: Davina Poke D.O.   On: 06/15/2021 08:42    Assessment & Plan:   Problem List Items Addressed This Visit       Cardiovascular and Mediastinum   VHD (valvular heart disease)    On Coumadin      Multiple pulmonary emboli ([place]) - Primary    On Coumadin      Relevant Orders   Urinalysis   Comprehensive metabolic panel (Completed)     Nervous and Auditory   Alzheimer disease ([place])    Worse She was not able to tolerate Aricept and other meds MMSE 18/30 consistent with moderate dementia       Relevant Orders   Urinalysis   TSH (Completed)   CBC with Differential/Platelet (Completed)   Comprehensive metabolic panel (Completed)   Vitamin B12 (Completed)   VITAMIN D 25 Hydroxy (Vit-D Deficiency, Fractures) (Completed)   Other Visit Diagnoses     Paresthesia       Relevant Orders   Urinalysis   TSH (Completed)   Vitamin B12 (Completed)   Vitamin D deficiency       Relevant Orders   VITAMIN D 25 Hydroxy (Vit-D Deficiency, Fractures) (Completed)         No orders of the defined types were placed in this encounter.     Follow-up: Return in about 3 months (around 10/08/2022) for a follow-up visit.  [name], MD

## 2022-07-10 NOTE — Assessment & Plan Note (Signed)
On Coumadin 

## 2022-07-10 NOTE — Assessment & Plan Note (Addendum)
Worse She was not able to tolerate Aricept and other meds MMSE 18/30 consistent with moderate dementia

## 2022-07-11 LAB — URINALYSIS, ROUTINE W REFLEX MICROSCOPIC
Bilirubin Urine: NEGATIVE
Hgb urine dipstick: NEGATIVE
Ketones, ur: NEGATIVE
Nitrite: NEGATIVE
Specific Gravity, Urine: 1.01 (ref 1.000–1.030)
Total Protein, Urine: NEGATIVE
Urine Glucose: NEGATIVE
Urobilinogen, UA: 0.2 (ref 0.0–1.0)
pH: 6 (ref 5.0–8.0)

## 2022-07-13 ENCOUNTER — Other Ambulatory Visit: Payer: Self-pay | Admitting: Internal Medicine

## 2022-07-13 MED ORDER — NITROFURANTOIN MONOHYD MACRO 100 MG PO CAPS: 100.0000 mg | ORAL_CAPSULE | Freq: Two times a day (BID) | ORAL | 0 refills | Status: DC

## 2022-07-17 DIAGNOSIS — Z7901 Long term (current) use of anticoagulants: Secondary | ICD-10-CM | POA: Diagnosis not present

## 2022-07-17 DIAGNOSIS — Z952 Presence of prosthetic heart valve: Secondary | ICD-10-CM | POA: Diagnosis not present

## 2022-07-17 DIAGNOSIS — I482 Chronic atrial fibrillation, unspecified: Secondary | ICD-10-CM | POA: Diagnosis not present

## 2022-07-21 NOTE — Assessment & Plan Note (Signed)
On Coumadin 

## 2022-07-24 ENCOUNTER — Telehealth: Payer: Self-pay | Admitting: *Deleted

## 2022-07-24 NOTE — Telephone Encounter (Signed)
Called daughter inform pt Medical Examination form is ready for pick-up.Marland KitchenJohny [name]

## 2022-07-24 NOTE — Telephone Encounter (Signed)
Patient and daughter came by to pick up form

## 2022-07-29 ENCOUNTER — Other Ambulatory Visit: Payer: Self-pay | Admitting: Internal Medicine

## 2022-07-31 DIAGNOSIS — Z952 Presence of prosthetic heart valve: Secondary | ICD-10-CM | POA: Diagnosis not present

## 2022-07-31 DIAGNOSIS — Z7901 Long term (current) use of anticoagulants: Secondary | ICD-10-CM | POA: Diagnosis not present

## 2022-07-31 DIAGNOSIS — I482 Chronic atrial fibrillation, unspecified: Secondary | ICD-10-CM | POA: Diagnosis not present

## 2022-08-28 DIAGNOSIS — Z952 Presence of prosthetic heart valve: Secondary | ICD-10-CM | POA: Diagnosis not present

## 2022-08-28 DIAGNOSIS — I482 Chronic atrial fibrillation, unspecified: Secondary | ICD-10-CM | POA: Diagnosis not present

## 2022-08-28 DIAGNOSIS — Z7901 Long term (current) use of anticoagulants: Secondary | ICD-10-CM | POA: Diagnosis not present

## 2022-09-03 ENCOUNTER — Other Ambulatory Visit: Payer: Self-pay | Admitting: Internal Medicine

## 2022-10-28 ENCOUNTER — Other Ambulatory Visit: Payer: Self-pay | Admitting: Internal Medicine

## 2022-11-19 ENCOUNTER — Encounter: Payer: Medicaid Other | Admitting: Internal Medicine

## 2022-12-02 ENCOUNTER — Other Ambulatory Visit: Payer: Self-pay | Admitting: Internal Medicine

## 2022-12-27 ENCOUNTER — Other Ambulatory Visit: Payer: Self-pay | Admitting: Internal Medicine

## 2022-12-30 ENCOUNTER — Other Ambulatory Visit: Payer: Self-pay | Admitting: Internal Medicine

## 2023-01-22 ENCOUNTER — Other Ambulatory Visit: Payer: Self-pay | Admitting: Internal Medicine

## 2023-01-22 NOTE — Telephone Encounter (Signed)
Prescription Request  01/22/2023  LOV: 07/10/2022  What is the name of the medication or equipment? warfarin  Have you contacted your pharmacy to request a refill? Yes   Which pharmacy would you like this sent to?  [facility] [address] [place] - [address] [place] [address] Phone: [phone] Fax: [phone]    Patient notified that their request is being sent to the clinical staff for review and that they should receive a response within 2 business days.   Please advise at Mobile [phone] (mobile)

## 2023-01-23 MED ORDER — WARFARIN SODIUM 2.5 MG PO TABS: ORAL_TABLET | ORAL | 3 refills | Status: DC

## 2023-02-03 ENCOUNTER — Other Ambulatory Visit: Payer: Self-pay

## 2023-02-03 DIAGNOSIS — I482 Chronic atrial fibrillation, unspecified: Secondary | ICD-10-CM | POA: Diagnosis not present

## 2023-02-03 DIAGNOSIS — Z7901 Long term (current) use of anticoagulants: Secondary | ICD-10-CM | POA: Diagnosis not present

## 2023-02-03 DIAGNOSIS — Z952 Presence of prosthetic heart valve: Secondary | ICD-10-CM | POA: Diagnosis not present

## 2023-02-03 MED ORDER — METOPROLOL SUCCINATE ER 50 MG PO TB24: 50.0000 mg | ORAL_TABLET | Freq: Every day | ORAL | 0 refills | Status: DC

## 2023-02-17 DIAGNOSIS — I5033 Acute on chronic diastolic (congestive) heart failure: Secondary | ICD-10-CM | POA: Diagnosis not present

## 2023-02-17 DIAGNOSIS — Z1330 Encounter for screening examination for mental health and behavioral disorders, unspecified: Secondary | ICD-10-CM | POA: Diagnosis not present

## 2023-02-17 DIAGNOSIS — Z951 Presence of aortocoronary bypass graft: Secondary | ICD-10-CM | POA: Diagnosis not present

## 2023-02-17 DIAGNOSIS — I2510 Atherosclerotic heart disease of native coronary artery without angina pectoris: Secondary | ICD-10-CM | POA: Diagnosis not present

## 2023-02-17 DIAGNOSIS — I482 Chronic atrial fibrillation, unspecified: Secondary | ICD-10-CM | POA: Diagnosis not present

## 2023-02-19 DIAGNOSIS — I482 Chronic atrial fibrillation, unspecified: Secondary | ICD-10-CM | POA: Diagnosis not present

## 2023-02-19 DIAGNOSIS — Z7901 Long term (current) use of anticoagulants: Secondary | ICD-10-CM | POA: Diagnosis not present

## 2023-02-19 DIAGNOSIS — Z952 Presence of prosthetic heart valve: Secondary | ICD-10-CM | POA: Diagnosis not present

## 2023-03-03 ENCOUNTER — Ambulatory Visit: Payer: Medicaid Other | Admitting: Internal Medicine

## 2023-03-03 ENCOUNTER — Encounter: Payer: Self-pay | Admitting: Internal Medicine

## 2023-03-03 VITALS — HR 74 | Temp 97.5°F | Ht <= 58 in | Wt 180.6 lb

## 2023-03-03 DIAGNOSIS — M25511 Pain in right shoulder: Secondary | ICD-10-CM

## 2023-03-03 DIAGNOSIS — R739 Hyperglycemia, unspecified: Secondary | ICD-10-CM | POA: Diagnosis not present

## 2023-03-03 DIAGNOSIS — G8929 Other chronic pain: Secondary | ICD-10-CM

## 2023-03-03 DIAGNOSIS — R269 Unspecified abnormalities of gait and mobility: Secondary | ICD-10-CM | POA: Diagnosis not present

## 2023-03-03 DIAGNOSIS — I10 Essential (primary) hypertension: Secondary | ICD-10-CM | POA: Diagnosis not present

## 2023-03-03 DIAGNOSIS — I4821 Permanent atrial fibrillation: Secondary | ICD-10-CM | POA: Diagnosis not present

## 2023-03-03 DIAGNOSIS — I509 Heart failure, unspecified: Secondary | ICD-10-CM | POA: Diagnosis not present

## 2023-03-03 DIAGNOSIS — Z23 Encounter for immunization: Secondary | ICD-10-CM | POA: Diagnosis not present

## 2023-03-03 DIAGNOSIS — I2510 Atherosclerotic heart disease of native coronary artery without angina pectoris: Secondary | ICD-10-CM

## 2023-03-03 LAB — CBC WITH DIFFERENTIAL/PLATELET
Basophils Absolute: 0 10*3/uL (ref 0.0–0.1)
Basophils Relative: 0.4 % (ref 0.0–3.0)
Eosinophils Absolute: 0.2 10*3/uL (ref 0.0–0.7)
Eosinophils Relative: 2.9 % (ref 0.0–5.0)
HCT: 38.8 % (ref 36.0–46.0)
Hemoglobin: 12.8 g/dL (ref 12.0–15.0)
Lymphocytes Relative: 17.6 % (ref 12.0–46.0)
Lymphs Abs: 1.3 10*3/uL (ref 0.7–4.0)
MCHC: 33.1 g/dL (ref 30.0–36.0)
MCV: 89.8 fL (ref 78.0–100.0)
Monocytes Absolute: 0.8 10*3/uL (ref 0.1–1.0)
Monocytes Relative: 11 % (ref 3.0–12.0)
Neutro Abs: 5.2 10*3/uL (ref 1.4–7.7)
Neutrophils Relative %: 68.1 % (ref 43.0–77.0)
Platelets: 250 10*3/uL (ref 150.0–400.0)
RBC: 4.32 Mil/uL (ref 3.87–5.11)
RDW: 14.1 % (ref 11.5–15.5)
WBC: 7.6 10*3/uL (ref 4.0–10.5)

## 2023-03-03 LAB — TSH: TSH: 3.07 u[IU]/mL (ref 0.35–5.50)

## 2023-03-03 LAB — T4, FREE: Free T4: 1 ng/dL (ref 0.60–1.60)

## 2023-03-03 LAB — HEMOGLOBIN A1C: Hgb A1c MFr Bld: 6 % (ref 4.6–6.5)

## 2023-03-03 MED ORDER — BUMETANIDE 1 MG PO TABS: 1.0000 mg | ORAL_TABLET | Freq: Two times a day (BID) | ORAL | 1 refills | Status: DC

## 2023-03-03 MED ORDER — AQUAPHOR EX OINT: TOPICAL_OINTMENT | CUTANEOUS | 3 refills | Status: AC | PRN

## 2023-03-03 MED ORDER — TRIAMCINOLONE ACETONIDE 0.1 % EX CREA: 1.0000 | TOPICAL_CREAM | Freq: Two times a day (BID) | CUTANEOUS | 3 refills | Status: AC

## 2023-03-03 MED ORDER — TRAMADOL HCL 50 MG PO TABS: 50.0000 mg | ORAL_TABLET | Freq: Two times a day (BID) | ORAL | 1 refills | Status: DC | PRN

## 2023-03-03 MED ORDER — METHYLPREDNISOLONE ACETATE 40 MG/ML IJ SUSP
40.0000 mg | Freq: Once | INTRAMUSCULAR | Status: AC
Start: 2023-03-03 — End: 2023-03-03
  Administered 2023-03-03: 40 mg via INTRA_ARTICULAR

## 2023-03-03 MED ORDER — WARFARIN SODIUM 2.5 MG PO TABS: ORAL_TABLET | ORAL | 3 refills | Status: DC

## 2023-03-03 MED ORDER — ESCITALOPRAM OXALATE 5 MG PO TABS: 5.0000 mg | ORAL_TABLET | Freq: Every day | ORAL | 1 refills | Status: DC

## 2023-03-03 MED ORDER — POTASSIUM CHLORIDE CRYS ER 20 MEQ PO TBCR: 20.0000 meq | EXTENDED_RELEASE_TABLET | Freq: Every day | ORAL | 1 refills | Status: DC

## 2023-03-03 MED ORDER — VITAMIN D3 50 MCG (2000 UT) PO CAPS: 2000.0000 [IU] | ORAL_CAPSULE | Freq: Every day | ORAL | 3 refills | Status: AC

## 2023-03-03 MED ORDER — METHYLPREDNISOLONE ACETATE 40 MG/ML IJ SUSP
40.0000 mg | Freq: Once | INTRAMUSCULAR | Status: DC
Start: 2023-03-03 — End: 2023-03-03

## 2023-03-03 MED ORDER — ALLOPURINOL 100 MG PO TABS: 50.0000 mg | ORAL_TABLET | Freq: Every day | ORAL | 3 refills | Status: AC

## 2023-03-03 MED ORDER — ATORVASTATIN CALCIUM 10 MG PO TABS: 10.0000 mg | ORAL_TABLET | Freq: Every day | ORAL | 3 refills | Status: AC

## 2023-03-03 NOTE — Assessment & Plan Note (Addendum)
  Dr Mayford Knife Furosemide was stopped 01/2023 Given Bumex On Toprol, Coumadin

## 2023-03-03 NOTE — Progress Notes (Unsigned)
Subjective:  Patient ID: Alexis [name], female    DOB: Apr 06, 1934  Age: 89 y.o. MRN: [id number]  CC: Well Adult Exam  (Pain in her right shoulder. )   HPI Navie Elison presents for CHF, dyslipidemia, gout Here w/dtr  Outpatient Medications Prior to Visit  Medication Sig Dispense Refill   acetaminophen (TYLENOL) 500 MG tablet Take 500 mg by mouth every 6 (six) hours as needed.     metoprolol succinate (TOPROL-XL) 50 MG 24 hr tablet Take 1 tablet (50 mg total) by mouth daily. Overdue for appt due must see provider for future refills 30 tablet 0   allopurinol (ZYLOPRIM) 100 MG tablet Take 0.5 tablets (50 mg total) by mouth daily. Annual appt is due must see provider for future refills 45 tablet 0   atorvastatin (LIPITOR) 10 MG tablet TAKE 1 TABLET BY MOUTH AT BEDTIME 90 tablet 3   b complex vitamins tablet Take 1 tablet by mouth daily. 100 tablet 3   bumetanide (BUMEX) 1 MG tablet Take 1 mg by mouth 2 (two) times daily.     Cholecalciferol (VITAMIN D3) 2000 units capsule Take 1 capsule (2,000 Units total) by mouth daily. 100 capsule 3   escitalopram (LEXAPRO) 5 MG tablet Take 1 tablet (5 mg total) by mouth daily. Follow-up appt due w/labs must see provider for future refills 90 tablet 0   mineral oil-hydrophilic petrolatum (AQUAPHOR) ointment Apply topically as needed for dry skin. 420 g 3   potassium chloride SA (KLOR-CON) 20 MEQ tablet Take 1 tablet (20 mEq total) by mouth daily. 90 tablet 1   traMADol (ULTRAM) 50 MG tablet Take 1-2 tablets (50-100 mg total) by mouth every 12 (twelve) hours as needed. 120 tablet 1   triamcinolone cream (KENALOG) 0.1 % Apply 1 application topically 2 (two) times daily. 160 g 3   warfarin (COUMADIN) 2.5 MG tablet TAKE 1/2 TABLET BY MOUTH IN THE EVENING ON SUNDAY, TUESDAY, THURSDAY AND SATURDAY, TAKE 1 TABLET ON MONDAY, WEDNESDAY AND FRIDAY 90 tablet 3   benzonatate (TESSALON) 100 MG capsule Take 1 capsule (100 mg total) by mouth 3 (three) times  daily as needed. 30 capsule 0   furosemide (LASIX) 40 MG tablet Take by mouth. Take 1 Twice a day     IBUPROFEN PO Take by mouth.     methylPREDNISolone (MEDROL DOSEPAK) 4 MG TBPK tablet As directed 21 tablet 2   nitrofurantoin, macrocrystal-monohydrate, (MACROBID) 100 MG capsule Take 1 capsule (100 mg total) by mouth 2 (two) times daily. 14 capsule 0   No facility-administered medications prior to visit.    ROS: Review of Systems  Constitutional:  Positive for unexpected weight change. Negative for activity change, appetite change, chills and fatigue.  HENT:  Negative for congestion, mouth sores and sinus pressure.   Eyes:  Negative for visual disturbance.  Respiratory:  Positive for shortness of breath. Negative for cough and chest tightness.   Gastrointestinal:  Negative for abdominal pain and nausea.  Genitourinary:  Negative for difficulty urinating, frequency and vaginal pain.  Musculoskeletal:  Positive for arthralgias and back pain. Negative for gait problem.  Skin:  Negative for pallor and rash.  Neurological:  Negative for dizziness, tremors, weakness, numbness and headaches.  Psychiatric/Behavioral:  Positive for confusion and decreased concentration. Negative for sleep disturbance and suicidal ideas.     Objective:  Pulse 74   Temp (!) 97.5 F (36.4 C) (Oral)   Ht 4\' 10"  (1.473 m)   Wt 180 lb 9.6 oz (81.9  kg)   SpO2 95%   BMI 37.75 kg/m   BP Readings from Last 3 Encounters:  07/10/22 120/68  01/17/22 130/84  07/02/21 118/68    Wt Readings from Last 3 Encounters:  03/03/23 180 lb 9.6 oz (81.9 kg)  07/10/22 169 lb (76.7 kg)  01/17/22 169 lb 3.2 oz (76.7 kg)    Physical Exam Constitutional:      General: She is not in acute distress.    Appearance: She is well-developed. She is obese.  HENT:     Head: Normocephalic.     Right Ear: External ear normal.     Left Ear: External ear normal.     Nose: Nose normal.  Eyes:     General:        Right eye: No  discharge.        Left eye: No discharge.     Conjunctiva/sclera: Conjunctivae normal.     Pupils: Pupils are equal, round, and reactive to light.  Neck:     Thyroid: No thyromegaly.     Vascular: No JVD.     Trachea: No tracheal deviation.  Cardiovascular:     Rate and Rhythm: Normal rate and regular rhythm.     Heart sounds: Normal heart sounds.  Pulmonary:     Effort: No respiratory distress.     Breath sounds: No stridor. No wheezing.  Abdominal:     General: Bowel sounds are normal. There is no distension.     Palpations: Abdomen is soft. There is no mass.     Tenderness: There is no abdominal tenderness. There is no guarding or rebound.  Musculoskeletal:        General: No tenderness.     Cervical back: Normal range of motion and neck supple. No rigidity.     Right lower leg: No edema.     Left lower leg: No edema.  Lymphadenopathy:     Cervical: No cervical adenopathy.  Skin:    Findings: No erythema or rash.  Neurological:     Mental Status: Mental status is at baseline.     Cranial Nerves: No cranial nerve deficit.     Motor: Weakness present. No abnormal muscle tone.     Coordination: Coordination normal.     Gait: Gait abnormal.     Deep Tendon Reflexes: Reflexes normal.  Psychiatric:        Behavior: Behavior normal.        Thought Content: Thought content normal.        Judgment: Judgment normal.   In a w/c R shoulder w/pain In a w/c    Procedure :Joint Injection, R  shoulder   Indication:  Subacromial bursitis with refractory  chronic pain.   Risks including unsuccessful procedure , bleeding, infection, bruising, skin atrophy, "steroid flare-up" and others were explained to the patient in detail as well as the benefits. Informed consent was obtained and signed.   Tthe patient was placed in a comfortable position. Lateral approach was used. Skin was prepped with Betadine and alcohol  and anesthetized with a cooling spray. Then, a 5 cc syringe with a 2  inch long 24-gauge needle was used for a joint injection.. The needle was advanced  Into the subacromial space.The bursa was injected with 3 mL of 2% lidocaine and 40 mg of Depo-Medrol .  Band-Aid was applied.   Tolerated well. Complications: None. Good pain relief following the procedure.   Postprocedure instructions :    A Band-Aid should be left  on for 12 hours. Injection therapy is not a cure itself. It is used in conjunction with other modalities. You can use nonsteroidal anti-inflammatories like ibuprofen , hot and cold compresses. Rest is recommended in the next 24 hours. You need to report immediately  if fever, chills or any signs of infection develop.    A total time of 45 minutes was spent preparing to see the patient, reviewing tests, x-rays, operative reports and other medical records, excluding the procedure.  Also, obtaining history and performing comprehensive physical exam.  Additionally, counseling the patient regarding the above listed issues - CHF, OA.   Finally, documenting clinical information in the health records, coordination of care, educating the patient. It is a complex case.     Lab Results  Component Value Date   WBC 9.1 07/10/2022   HGB 13.2 07/10/2022   HCT 38.6 07/10/2022   PLT 241.0 07/10/2022   GLUCOSE 95 07/10/2022   CHOL 173 01/10/2021   TRIG 305.0 (H) 01/10/2021   HDL 41.40 01/10/2021   LDLDIRECT 90.0 01/10/2021   LDLCALC 91 05/04/2018   ALT 16 07/10/2022   AST 24 07/10/2022   NA 135 07/10/2022   K 4.1 07/10/2022   CL 100 07/10/2022   CREATININE 1.37 (H) 07/10/2022   BUN 26 (H) 07/10/2022   CO2 26 07/10/2022   TSH 2.46 07/10/2022   INR 2.7 (A) 12/27/2019   HGBA1C 6.0 08/16/2019    CT ANKLE RIGHT WO CONTRAST  Result Date: 06/15/2021 CLINICAL DATA:  Evaluate right ankle fracture EXAM: CT OF THE RIGHT ANKLE WITHOUT CONTRAST TECHNIQUE: Multidetector CT imaging of the right ankle was performed according to the standard protocol. Multiplanar CT  image reconstructions were also generated. RADIATION DOSE REDUCTION: This exam was performed according to the departmental dose-optimization program which includes automated exposure control, adjustment of the mA and/or kV according to patient size and/or use of iterative reconstruction technique. COMPARISON:  X-ray 06/11/2021 FINDINGS: Bones/Joint/Cartilage Acute comminuted nondisplaced fracture of the medial malleolus with horizontal and vertical components. Obliquely oriented nondisplaced fracture through the lateral malleolus. No posterior malleolar fracture component. Ankle mortise is congruent without widening or dislocation. Talus and calcaneus are intact. Subtalar joints are aligned. No acute fracture or malalignment within the midfoot. Moderate degenerative changes are most pronounced at the second through fourth tarsometatarsal joints. Bones are demineralized. Ligaments Suboptimally assessed by CT. Muscles and Tendons No acute musculotendinous abnormality by CT. Soft tissues Soft tissue swelling and edema. Small hematoma overlies the lateral malleolar fracture site measuring approximately 3.6 x 0.9 cm. Atherosclerotic vascular calcifications are present. IMPRESSION: 1. Acute comminuted nondisplaced fractures of the medial and lateral malleoli, as described above. 2. Small hematoma overlies the lateral malleolar fracture site. Electronically Signed   By: Duanne Guess D.O.   On: 06/15/2021 08:42    Assessment & Plan:   Problem List Items Addressed This Visit     Essential hypertension     Dr Mayford Knife Furosemide was stopped 01/2023 Given Bumex On Toprol, Coumadin      Relevant Medications   atorvastatin (LIPITOR) 10 MG tablet   bumetanide (BUMEX) 1 MG tablet   warfarin (COUMADIN) 2.5 MG tablet   Other Relevant Orders   TSH   Urinalysis   CBC with Differential/Platelet   Lipid panel   Comprehensive metabolic panel   T4, free   Hemoglobin A1c   Coronary atherosclerosis    No angina.   Continue with Lipitor, Coumadin, Toprol      Relevant Medications  atorvastatin (LIPITOR) 10 MG tablet   bumetanide (BUMEX) 1 MG tablet   warfarin (COUMADIN) 2.5 MG tablet   Congestive heart failure (HCC)     Dr Mayford Knife Furosemide was stopped Given Bumex      Relevant Medications   atorvastatin (LIPITOR) 10 MG tablet   bumetanide (BUMEX) 1 MG tablet   warfarin (COUMADIN) 2.5 MG tablet   Other Relevant Orders   TSH   Urinalysis   CBC with Differential/Platelet   Lipid panel   Comprehensive metabolic panel   T4, free   Hemoglobin A1c   Atrial fibrillation (HCC)   Relevant Medications   atorvastatin (LIPITOR) 10 MG tablet   bumetanide (BUMEX) 1 MG tablet   warfarin (COUMADIN) 2.5 MG tablet   Other Relevant Orders   TSH   Urinalysis   CBC with Differential/Platelet   Lipid panel   Comprehensive metabolic panel   T4, free   Hemoglobin A1c   Other Visit Diagnoses     Need for immunization against influenza    -  Primary   Relevant Orders   Flu Vaccine Trivalent High Dose (Fluad)   Hyperglycemia       Relevant Orders   Comprehensive metabolic panel   Hemoglobin A1c   Gait disorder       Relevant Orders   Ambulatory referral to Physical Therapy         Meds ordered this encounter  Medications   allopurinol (ZYLOPRIM) 100 MG tablet    Sig: Take 0.5 tablets (50 mg total) by mouth daily. Annual appt is due must see provider for future refills    Dispense:  45 tablet    Refill:  3   atorvastatin (LIPITOR) 10 MG tablet    Sig: Take 1 tablet (10 mg total) by mouth at bedtime.    Dispense:  90 tablet    Refill:  3   bumetanide (BUMEX) 1 MG tablet    Sig: Take 1 tablet (1 mg total) by mouth 2 (two) times daily.    Dispense:  180 tablet    Refill:  1   Cholecalciferol (VITAMIN D3) 50 MCG (2000 UT) capsule    Sig: Take 1 capsule (2,000 Units total) by mouth daily.    Dispense:  100 capsule    Refill:  3   escitalopram (LEXAPRO) 5 MG tablet    Sig: Take 1  tablet (5 mg total) by mouth daily. Follow-up appt due w/labs must see provider for future refills    Dispense:  90 tablet    Refill:  1   mineral oil-hydrophilic petrolatum (AQUAPHOR) ointment    Sig: Apply topically as needed for dry skin.    Dispense:  420 g    Refill:  3   potassium chloride SA (KLOR-CON M) 20 MEQ tablet    Sig: Take 1 tablet (20 mEq total) by mouth daily.    Dispense:  90 tablet    Refill:  1   traMADol (ULTRAM) 50 MG tablet    Sig: Take 1-2 tablets (50-100 mg total) by mouth every 12 (twelve) hours as needed.    Dispense:  120 tablet    Refill:  1   triamcinolone cream (KENALOG) 0.1 %    Sig: Apply 1 Application topically 2 (two) times daily.    Dispense:  160 g    Refill:  3   warfarin (COUMADIN) 2.5 MG tablet    Sig: TAKE 1/2 TABLET BY MOUTH IN THE EVENING ON SUNDAY, TUESDAY, THURSDAY AND SATURDAY,  TAKE 1 TABLET ON MONDAY, WEDNESDAY AND FRIDAY    Dispense:  90 tablet    Refill:  3      Follow-up: Return in about 3 months (around 06/03/2023) for a follow-up visit.  Sonda Primes, MD

## 2023-03-03 NOTE — Assessment & Plan Note (Signed)
No angina.  Continue with Lipitor, Coumadin, Toprol

## 2023-03-03 NOTE — Assessment & Plan Note (Signed)
  Dr Mayford Knife Furosemide was stopped Given Bumex

## 2023-03-04 ENCOUNTER — Encounter: Payer: Self-pay | Admitting: Internal Medicine

## 2023-03-04 LAB — COMPREHENSIVE METABOLIC PANEL
ALT: 12 U/L (ref 0–35)
AST: 24 U/L (ref 0–37)
Albumin: 3.7 g/dL (ref 3.5–5.2)
Alkaline Phosphatase: 114 U/L (ref 39–117)
BUN: 19 mg/dL (ref 6–23)
CO2: 27 meq/L (ref 19–32)
Calcium: 9.5 mg/dL (ref 8.4–10.5)
Chloride: 103 meq/L (ref 96–112)
Creatinine, Ser: 1.27 mg/dL — ABNORMAL HIGH (ref 0.40–1.20)
GFR: 38.19 mL/min — ABNORMAL LOW (ref >60.00)
Glucose, Bld: 109 mg/dL — ABNORMAL HIGH (ref 70–99)
Potassium: 4.5 meq/L (ref 3.5–5.1)
Sodium: 138 meq/L (ref 135–145)
Total Bilirubin: 0.4 mg/dL (ref 0.2–1.2)
Total Protein: 7.1 g/dL (ref 6.0–8.3)

## 2023-03-04 LAB — LIPID PANEL
Cholesterol: 183 mg/dL (ref 0–200)
HDL: 52.1 mg/dL (ref >39.00)
LDL Cholesterol: 86 mg/dL (ref 0–99)
NonHDL: 131.39
Total CHOL/HDL Ratio: 4
Triglycerides: 227 mg/dL — ABNORMAL HIGH (ref 0.0–149.0)
VLDL: 45.4 mg/dL — ABNORMAL HIGH (ref 0.0–40.0)

## 2023-03-04 LAB — URINALYSIS
Bilirubin Urine: NEGATIVE
Hgb urine dipstick: NEGATIVE
Ketones, ur: NEGATIVE
Leukocytes,Ua: NEGATIVE
Nitrite: NEGATIVE
Specific Gravity, Urine: 1.01 (ref 1.000–1.030)
Total Protein, Urine: NEGATIVE
Urine Glucose: NEGATIVE
Urobilinogen, UA: 0.2 (ref 0.0–1.0)
pH: 6 (ref 5.0–8.0)

## 2023-03-05 ENCOUNTER — Telehealth: Payer: Self-pay | Admitting: Internal Medicine

## 2023-03-05 DIAGNOSIS — Z79899 Other long term (current) drug therapy: Secondary | ICD-10-CM | POA: Diagnosis not present

## 2023-03-05 DIAGNOSIS — I482 Chronic atrial fibrillation, unspecified: Secondary | ICD-10-CM | POA: Diagnosis not present

## 2023-03-05 DIAGNOSIS — Z952 Presence of prosthetic heart valve: Secondary | ICD-10-CM | POA: Diagnosis not present

## 2023-03-05 DIAGNOSIS — R609 Edema, unspecified: Secondary | ICD-10-CM | POA: Diagnosis not present

## 2023-03-05 DIAGNOSIS — Z7901 Long term (current) use of anticoagulants: Secondary | ICD-10-CM | POA: Diagnosis not present

## 2023-03-06 ENCOUNTER — Telehealth: Payer: Self-pay | Admitting: Internal Medicine

## 2023-03-06 NOTE — Telephone Encounter (Signed)
Patient's daughter called and said patient was referred to [facility] for physical therapy, but they do not accept her insurance. They would like to know if the referral can be changed to [facility] in [place], [place]. Best callback is [phone].

## 2023-03-07 ENCOUNTER — Other Ambulatory Visit: Payer: Self-pay

## 2023-03-07 ENCOUNTER — Telehealth: Payer: Self-pay | Admitting: Internal Medicine

## 2023-03-07 MED ORDER — METOPROLOL SUCCINATE ER 50 MG PO TB24: 50.0000 mg | ORAL_TABLET | Freq: Every day | ORAL | 3 refills | Status: DC

## 2023-03-07 NOTE — Telephone Encounter (Signed)
Prescription Request  03/07/2023  LOV: 03/03/2023 Pt ask if she can do the 90 day supply?  What is the name of the medication or equipment? metoprolol succinate (TOPROL-XL) 50 MG 24 hr tablet   Have you contacted your pharmacy to request a refill? No   Which pharmacy would you like this sent to?  [facility] [address], [place] - [address] [place] [address] Phone: [phone] Fax: [phone]    Patient notified that their request is being sent to the clinical staff for review and that they should receive a response within 2 business days.   Please advise at Mobile [phone] (mobile)

## 2023-03-07 NOTE — Telephone Encounter (Signed)
Yes, please. Thank you.

## 2023-03-08 DIAGNOSIS — F419 Anxiety disorder, unspecified: Secondary | ICD-10-CM | POA: Diagnosis not present

## 2023-03-08 DIAGNOSIS — R29810 Facial weakness: Secondary | ICD-10-CM | POA: Diagnosis not present

## 2023-03-08 DIAGNOSIS — I482 Chronic atrial fibrillation, unspecified: Secondary | ICD-10-CM | POA: Diagnosis not present

## 2023-03-08 DIAGNOSIS — R519 Headache, unspecified: Secondary | ICD-10-CM | POA: Diagnosis not present

## 2023-03-08 DIAGNOSIS — Z7901 Long term (current) use of anticoagulants: Secondary | ICD-10-CM | POA: Diagnosis not present

## 2023-03-08 DIAGNOSIS — Z8673 Personal history of transient ischemic attack (TIA), and cerebral infarction without residual deficits: Secondary | ICD-10-CM | POA: Diagnosis not present

## 2023-03-08 DIAGNOSIS — R9431 Abnormal electrocardiogram [ECG] [EKG]: Secondary | ICD-10-CM | POA: Diagnosis not present

## 2023-03-11 ENCOUNTER — Ambulatory Visit: Payer: Medicaid Other | Admitting: Internal Medicine

## 2023-03-11 ENCOUNTER — Encounter: Payer: Self-pay | Admitting: Internal Medicine

## 2023-03-11 VITALS — BP 110/60 | HR 45 | Temp 98.2°F | Ht <= 58 in | Wt 168.0 lb

## 2023-03-11 DIAGNOSIS — I10 Essential (primary) hypertension: Secondary | ICD-10-CM

## 2023-03-11 DIAGNOSIS — G309 Alzheimer's disease, unspecified: Secondary | ICD-10-CM

## 2023-03-11 DIAGNOSIS — R519 Headache, unspecified: Secondary | ICD-10-CM | POA: Insufficient documentation

## 2023-03-11 DIAGNOSIS — F0280 Dementia in other diseases classified elsewhere without behavioral disturbance: Secondary | ICD-10-CM

## 2023-03-11 DIAGNOSIS — G44211 Episodic tension-type headache, intractable: Secondary | ICD-10-CM

## 2023-03-11 MED ORDER — BUTALBITAL-APAP-CAFFEINE 50-325-40 MG PO TABS
1.0000 | ORAL_TABLET | Freq: Four times a day (QID) | ORAL | 0 refills | Status: AC | PRN
Start: 2023-03-11 — End: ?

## 2023-03-11 NOTE — Assessment & Plan Note (Signed)
Low BP now On Toprol

## 2023-03-11 NOTE — Assessment & Plan Note (Signed)
New-likely a migraine headache Her condition has improved a lot after she received IV fluids and Fioricet in the emergency room.  Blood pressure has normalized.  Imaging studies unremarkable Fioricet as needed headache

## 2023-03-11 NOTE — Progress Notes (Signed)
Subjective:  Patient ID: Alexis [name], female    DOB: 1933/11/25  Age: 89 y.o. MRN: [id number]  CC: Hospitalization Follow-up (Pt has been having increase with weakness in lower limbs, pt has been having diarrhea and lots of increase with confusion since starting on new medication Hydroxyzine. )   HPI Alexis [name] presents for ER visit for a severe HA, shakes on 03/08/23 Her condition has improved a lot after she received IV fluids and Fioricet in the emergency room.  Blood pressure has normalized  She is here with her daughter.   Outpatient Medications Prior to Visit  Medication Sig Dispense Refill   acetaminophen (TYLENOL) 500 MG tablet Take 500 mg by mouth every 6 (six) hours as needed.     allopurinol (ZYLOPRIM) 100 MG tablet Take 0.5 tablets (50 mg total) by mouth daily. Annual appt is due must see provider for future refills 45 tablet 3   atorvastatin (LIPITOR) 10 MG tablet Take 1 tablet (10 mg total) by mouth at bedtime. 90 tablet 3   bumetanide (BUMEX) 1 MG tablet Take 1 tablet (1 mg total) by mouth 2 (two) times daily. 180 tablet 1   Cholecalciferol (VITAMIN D3) 50 MCG (2000 UT) capsule Take 1 capsule (2,000 Units total) by mouth daily. 100 capsule 3   escitalopram (LEXAPRO) 5 MG tablet Take 1 tablet (5 mg total) by mouth daily. Follow-up appt due w/labs must see provider for future refills 90 tablet 1   hydrOXYzine (VISTARIL) 25 MG capsule Take by mouth.     metoprolol succinate (TOPROL-XL) 50 MG 24 hr tablet Take 1 tablet (50 mg total) by mouth daily. Overdue for appt due must see provider for future refills 90 tablet 3   mineral oil-hydrophilic petrolatum (AQUAPHOR) ointment Apply topically as needed for dry skin. 420 g 3   potassium chloride SA (KLOR-CON M) 20 MEQ tablet Take 1 tablet (20 mEq total) by mouth daily. 90 tablet 1   traMADol (ULTRAM) 50 MG tablet Take 1-2 tablets (50-100 mg total) by mouth every 12 (twelve) hours as needed. 120 tablet 1    triamcinolone cream (KENALOG) 0.1 % Apply 1 Application topically 2 (two) times daily. 160 g 3   warfarin (COUMADIN) 2.5 MG tablet TAKE 1/2 TABLET BY MOUTH IN THE EVENING ON SUNDAY, TUESDAY, THURSDAY AND SATURDAY, TAKE 1 TABLET ON MONDAY, WEDNESDAY AND FRIDAY 90 tablet 3   No facility-administered medications prior to visit.    ROS: Review of Systems  Constitutional:  Negative for activity change, appetite change, chills, fatigue and unexpected weight change.  HENT:  Negative for congestion, mouth sores and sinus pressure.   Eyes:  Negative for visual disturbance.  Respiratory:  Negative for cough and chest tightness.   Gastrointestinal:  Negative for abdominal pain and nausea.  Genitourinary:  Negative for difficulty urinating, frequency and vaginal pain.  Musculoskeletal:  Negative for back pain and gait problem.  Skin:  Negative for pallor and rash.  Neurological:  Negative for dizziness, tremors, weakness, numbness and headaches.  Psychiatric/Behavioral:  Positive for confusion and decreased concentration. Negative for sleep disturbance and suicidal ideas.     Objective:  BP 110/60 (BP Location: Left Arm, Patient Position: Sitting, Cuff Size: Normal)   Pulse (!) 45   Temp 98.2 F (36.8 C) (Oral)   Ht 4\' 10"  (1.473 m)   Wt 168 lb (76.2 kg)   SpO2 97%   BMI 35.11 kg/m   BP Readings from Last 3 Encounters:  03/11/23 110/60  07/10/22 120/68  01/17/22 130/84    Wt Readings from Last 3 Encounters:  03/11/23 168 lb (76.2 kg)  03/03/23 180 lb 9.6 oz (81.9 kg)  07/10/22 169 lb (76.7 kg)    Physical Exam Constitutional:      General: She is not in acute distress.    Appearance: She is well-developed.  HENT:     Head: Normocephalic.     Right Ear: External ear normal.     Left Ear: External ear normal.     Nose: Nose normal.  Eyes:     General:        Right eye: No discharge.        Left eye: No discharge.     Conjunctiva/sclera: Conjunctivae normal.     Pupils:  Pupils are equal, round, and reactive to light.  Neck:     Thyroid: No thyromegaly.     Vascular: No JVD.     Trachea: No tracheal deviation.  Cardiovascular:     Rate and Rhythm: Normal rate and regular rhythm.     Heart sounds: Normal heart sounds.  Pulmonary:     Effort: No respiratory distress.     Breath sounds: No stridor. No wheezing.  Abdominal:     General: Bowel sounds are normal. There is no distension.     Palpations: Abdomen is soft. There is no mass.     Tenderness: There is no abdominal tenderness. There is no guarding or rebound.  Musculoskeletal:        General: No tenderness.     Cervical back: Normal range of motion and neck supple. No rigidity.  Lymphadenopathy:     Cervical: No cervical adenopathy.  Skin:    Findings: No erythema or rash.  Neurological:     Mental Status: Mental status is at baseline.     Cranial Nerves: No cranial nerve deficit.     Motor: Weakness present. No abnormal muscle tone.     Coordination: Coordination abnormal.     Gait: Gait abnormal.     Deep Tendon Reflexes: Reflexes normal.  Psychiatric:        Behavior: Behavior normal.        Thought Content: Thought content normal.   In a wheelchair Nonfocal exam   Lab Results  Component Value Date   WBC 7.6 03/03/2023   HGB 12.8 03/03/2023   HCT 38.8 03/03/2023   PLT 250.0 03/03/2023   GLUCOSE 109 (H) 03/03/2023   CHOL 183 03/03/2023   TRIG 227.0 (H) 03/03/2023   HDL 52.10 03/03/2023   LDLDIRECT 90.0 01/10/2021   LDLCALC 86 03/03/2023   ALT 12 03/03/2023   AST 24 03/03/2023   NA 138 03/03/2023   K 4.5 03/03/2023   CL 103 03/03/2023   CREATININE 1.27 (H) 03/03/2023   BUN 19 03/03/2023   CO2 27 03/03/2023   TSH 3.07 03/03/2023   INR 2.7 (A) 12/27/2019   HGBA1C 6.0 03/03/2023    CT ANKLE RIGHT WO CONTRAST  Result Date: 06/15/2021 CLINICAL DATA:  Evaluate right ankle fracture EXAM: CT OF THE RIGHT ANKLE WITHOUT CONTRAST TECHNIQUE: Multidetector CT imaging of the  right ankle was performed according to the standard protocol. Multiplanar CT image reconstructions were also generated. RADIATION DOSE REDUCTION: This exam was performed according to the departmental dose-optimization program which includes automated exposure control, adjustment of the mA and/or kV according to patient size and/or use of iterative reconstruction technique. COMPARISON:  X-ray 06/11/2021 FINDINGS: Bones/Joint/Cartilage Acute comminuted nondisplaced fracture of the medial malleolus with  horizontal and vertical components. Obliquely oriented nondisplaced fracture through the lateral malleolus. No posterior malleolar fracture component. Ankle mortise is congruent without widening or dislocation. Talus and calcaneus are intact. Subtalar joints are aligned. No acute fracture or malalignment within the midfoot. Moderate degenerative changes are most pronounced at the second through fourth tarsometatarsal joints. Bones are demineralized. Ligaments Suboptimally assessed by CT. Muscles and Tendons No acute musculotendinous abnormality by CT. Soft tissues Soft tissue swelling and edema. Small hematoma overlies the lateral malleolar fracture site measuring approximately 3.6 x 0.9 cm. Atherosclerotic vascular calcifications are present. IMPRESSION: 1. Acute comminuted nondisplaced fractures of the medial and lateral malleoli, as described above. 2. Small hematoma overlies the lateral malleolar fracture site. Electronically Signed   By: Duanne Guess D.O.   On: 06/15/2021 08:42    Assessment & Plan:   Problem List Items Addressed This Visit     Essential hypertension - Primary    Low BP now On Toprol      Alzheimer disease (HCC)    No change.  Continue with current therapy      Relevant Medications   hydrOXYzine (VISTARIL) 25 MG capsule   Headache    New-likely a migraine headache Her condition has improved a lot after she received IV fluids and Fioricet in the emergency room.  Blood  pressure has normalized.  Imaging studies unremarkable Fioricet as needed headache      Relevant Medications   butalbital-acetaminophen-caffeine (FIORICET) 50-325-40 MG tablet      Meds ordered this encounter  Medications   butalbital-acetaminophen-caffeine (FIORICET) 50-325-40 MG tablet    Sig: Take 1-2 tablets by mouth every 6 (six) hours as needed for headache.    Dispense:  60 tablet    Refill:  0      Follow-up: Return in about 3 months (around 06/11/2023) for a follow-up visit.  Sonda Primes, MD

## 2023-03-11 NOTE — Assessment & Plan Note (Signed)
No change.  Continue with current therapy

## 2023-03-14 ENCOUNTER — Telehealth: Payer: Self-pay | Admitting: Pharmacy Technician

## 2023-03-14 ENCOUNTER — Other Ambulatory Visit (HOSPITAL_COMMUNITY): Payer: Self-pay

## 2023-03-14 DIAGNOSIS — I5033 Acute on chronic diastolic (congestive) heart failure: Secondary | ICD-10-CM | POA: Diagnosis not present

## 2023-03-14 DIAGNOSIS — R609 Edema, unspecified: Secondary | ICD-10-CM | POA: Diagnosis not present

## 2023-03-14 DIAGNOSIS — I700 Atherosclerosis of aorta: Secondary | ICD-10-CM | POA: Diagnosis not present

## 2023-03-14 DIAGNOSIS — Z951 Presence of aortocoronary bypass graft: Secondary | ICD-10-CM | POA: Diagnosis not present

## 2023-03-14 NOTE — Telephone Encounter (Signed)
Pharmacy Patient Advocate Encounter  Received notification from [facility] that Prior Authorization for Tramadol has been APPROVED from 03/14/2023 to 09/10/2023   PA #/Case ID/Reference #: NA, approval attached to pt media

## 2023-03-14 NOTE — Telephone Encounter (Signed)
Pharmacy Patient Advocate Encounter   Received notification from CoverMyMeds that prior authorization for traMADol HCl 50MG  tablets is required/requested.   Insurance verification completed.   The patient is insured through [facility] .   Per test claim: PA required; PA submitted to [facility] via CoverMyMeds Key/confirmation #/EOC [id number] Status is pending

## 2023-03-17 DIAGNOSIS — Z952 Presence of prosthetic heart valve: Secondary | ICD-10-CM | POA: Diagnosis not present

## 2023-03-17 DIAGNOSIS — N189 Chronic kidney disease, unspecified: Secondary | ICD-10-CM | POA: Diagnosis not present

## 2023-03-17 DIAGNOSIS — I361 Nonrheumatic tricuspid (valve) insufficiency: Secondary | ICD-10-CM | POA: Diagnosis not present

## 2023-03-17 DIAGNOSIS — I2510 Atherosclerotic heart disease of native coronary artery without angina pectoris: Secondary | ICD-10-CM | POA: Diagnosis not present

## 2023-03-17 DIAGNOSIS — I340 Nonrheumatic mitral (valve) insufficiency: Secondary | ICD-10-CM | POA: Diagnosis not present

## 2023-03-17 DIAGNOSIS — Z951 Presence of aortocoronary bypass graft: Secondary | ICD-10-CM | POA: Diagnosis not present

## 2023-03-19 DIAGNOSIS — Z7901 Long term (current) use of anticoagulants: Secondary | ICD-10-CM | POA: Diagnosis not present

## 2023-03-19 DIAGNOSIS — I482 Chronic atrial fibrillation, unspecified: Secondary | ICD-10-CM | POA: Diagnosis not present

## 2023-03-19 DIAGNOSIS — Z952 Presence of prosthetic heart valve: Secondary | ICD-10-CM | POA: Diagnosis not present

## 2023-03-25 ENCOUNTER — Telehealth: Payer: Self-pay | Admitting: Internal Medicine

## 2023-03-25 NOTE — Telephone Encounter (Signed)
Pt daughter called and stating is no eating like she should. Pt daughter also stated pt only eating a little snacsk like ice cream and maybe a cookie but no real food. Pt wanted to speak with Hilda Lias. Please advise.

## 2023-03-26 NOTE — Telephone Encounter (Signed)
Pts daughter states her mom is barely eating. She has to remind pt to eat. Pts daughter is giving pt a protein shake with ice ream in it and pt will drink half of it. However, if pt is given food she will taste it and state she does not want it. Pt is not eating solid foods but is drinking.  Pts daughter is wanting to know when she should be concerned about this medically?

## 2023-03-27 NOTE — Telephone Encounter (Signed)
Noted.  It could be a virus.  The patient lose a few pounds.  See how it goes.  Come to see me in a couple weeks if not resolved -we will look for other potential causes.

## 2023-03-28 NOTE — Telephone Encounter (Signed)
Tried calling pts daughter phone rang out... Please inform pts daughter that Dr. Demetrius Charity has stated " Noted.  It could be a virus.  The patient lose a few pounds.  See how it goes.  Come to see me in a couple weeks if not resolved -we will look for other potential causes.

## 2023-04-08 ENCOUNTER — Telehealth: Payer: Self-pay | Admitting: Internal Medicine

## 2023-04-08 DIAGNOSIS — R197 Diarrhea, unspecified: Secondary | ICD-10-CM

## 2023-04-08 NOTE — Telephone Encounter (Signed)
Pt daughter called about her mother which is having diarrhea and doing a My Cart video visit tomorrow and pt daughter wants to know if they can do a stool sample before appt please advise.

## 2023-04-09 ENCOUNTER — Telehealth (INDEPENDENT_AMBULATORY_CARE_PROVIDER_SITE_OTHER): Payer: Medicaid Other | Admitting: Internal Medicine

## 2023-04-09 ENCOUNTER — Encounter: Payer: Self-pay | Admitting: Internal Medicine

## 2023-04-09 DIAGNOSIS — G309 Alzheimer's disease, unspecified: Secondary | ICD-10-CM | POA: Diagnosis not present

## 2023-04-09 DIAGNOSIS — Z7901 Long term (current) use of anticoagulants: Secondary | ICD-10-CM | POA: Diagnosis not present

## 2023-04-09 DIAGNOSIS — Z952 Presence of prosthetic heart valve: Secondary | ICD-10-CM | POA: Diagnosis not present

## 2023-04-09 DIAGNOSIS — R197 Diarrhea, unspecified: Secondary | ICD-10-CM | POA: Diagnosis not present

## 2023-04-09 DIAGNOSIS — I4821 Permanent atrial fibrillation: Secondary | ICD-10-CM

## 2023-04-09 DIAGNOSIS — F0280 Dementia in other diseases classified elsewhere without behavioral disturbance: Secondary | ICD-10-CM | POA: Diagnosis not present

## 2023-04-09 MED ORDER — DIPHENOXYLATE-ATROPINE 2.5-0.025 MG PO TABS
1.0000 | ORAL_TABLET | Freq: Four times a day (QID) | ORAL | 1 refills | Status: DC | PRN
Start: 2023-04-09 — End: 2023-07-04

## 2023-04-09 MED ORDER — XARELTO 15 MG PO TABS: 15.0000 mg | ORAL_TABLET | Freq: Every day | ORAL | 5 refills | Status: DC

## 2023-04-09 NOTE — Assessment & Plan Note (Signed)
Diarrhea was caused by Eliquis most likely.  It would be an unusual side effect, however.  Will switch back to Xarelto Lomotil as needed diarrhea-prescription provided

## 2023-04-09 NOTE — Assessment & Plan Note (Signed)
Patient was on Coumadin.  Coumadin clinic suggested switching from Coumadin to Xarelto due to inconsistent diet, fluctuating INR etc.

## 2023-04-09 NOTE — Assessment & Plan Note (Signed)
They would like to see a geriatrician for her Alzheimer's Options reviewed.  Will refer to the [facility] center

## 2023-04-09 NOTE — Telephone Encounter (Signed)
Yes.  Thank you.

## 2023-04-09 NOTE — Assessment & Plan Note (Signed)
Patient was on Coumadin.  Coumadin clinic suggested switching from Coumadin to Xarelto due to inconsistent diet, fluctuating INR etc.  Follow-up with Dr. Gwendalyn Ege.

## 2023-04-09 NOTE — Progress Notes (Signed)
Virtual Visit via Video Note  I connected with Alexis [name] on 04/09/23 at  9:10 AM EST by a video enabled telemedicine application and verified that I am speaking with the correct person using two identifiers.   I discussed the limitations of evaluation and management by telemedicine and the availability of in person appointments. The patient expressed understanding and agreed to proceed.  I was located at our [facility] office. The patient was at home. Dtr Alexis [name] was present in the visit.  Chief Complaint  Patient presents with   Diarrhea     History of Present Illness: The patient is complaining of severe diarrhea that started after she was switched from Xarelto to Eliquis last week.  She started to have watery stools every 15 minutes with multiple incontinence episodes.  They switched back to Xarelto a couple days ago (15 mg a day).  The diarrhea has practically stopped.  The patient had 1 stool earlier today.  There was no urinary output when she was having severe diarrhea.  Patient was on Coumadin.  Coumadin clinic suggested switching from Coumadin to Xarelto due to inconsistent diet, fluctuating INR etc.  They would like to see a geriatrician for her Alzheimer's  The patient lost weight    Review of Systems  Constitutional:  Positive for malaise/fatigue. Negative for chills, diaphoresis and fever.  HENT:  Positive for hearing loss. Negative for congestion.   Eyes:  Negative for redness.  Respiratory:  Positive for shortness of breath. Negative for cough, hemoptysis and wheezing.   Cardiovascular:  Positive for orthopnea and leg swelling. Negative for chest pain.  Gastrointestinal:  Positive for diarrhea. Negative for abdominal pain, blood in stool, nausea and vomiting.  Musculoskeletal:  Positive for back pain.  Skin:  Negative for rash.  Neurological:  Negative for headaches.  Psychiatric/Behavioral:  Positive for memory loss. Negative for suicidal ideas.       Observations/Objective: The patient appears to be in no acute distress  Assessment and Plan:  Problem List Items Addressed This Visit     Atrial fibrillation [facility])    Patient was on Coumadin.  Coumadin clinic suggested switching from Coumadin to Xarelto due to inconsistent diet, fluctuating INR etc.      Relevant Medications   XARELTO 15 MG TABS tablet   Mitral valve replaced    Patient was on Coumadin.  Coumadin clinic suggested switching from Coumadin to Xarelto due to inconsistent diet, fluctuating INR etc.  Follow-up with Dr. Gwendalyn [name].       Long-term (current) use of anticoagulants, INR goal 2.0-3.0    Patient was on Coumadin.  Coumadin clinic suggested switching from Coumadin to Xarelto due to inconsistent diet, fluctuating INR etc.      Diarrhea    Diarrhea was caused by Eliquis most likely.  It would be an unusual side effect, however.  Will switch back to Xarelto Lomotil as needed diarrhea-prescription provided      Alzheimer disease (HCC) - Primary    They would like to see a geriatrician for her Alzheimer's Options reviewed.  Will refer to the [facility] center      Relevant Orders   Ambulatory referral to Geriatrics     Meds ordered this encounter  Medications   diphenoxylate-atropine (LOMOTIL) 2.5-0.025 MG tablet    Sig: Take 1 tablet by mouth 4 (four) times daily as needed for diarrhea or loose stools.    Dispense:  20 tablet    Refill:  1   XARELTO 15 MG  TABS tablet    Sig: Take 1 tablet (15 mg total) by mouth daily.    Dispense:  30 tablet    Refill:  5     Follow Up Instructions:    I discussed the assessment and treatment plan with the patient. The patient was provided an opportunity to ask questions and all were answered. The patient agreed with the plan and demonstrated an understanding of the instructions.   The patient was advised to call back or seek an in-person evaluation if the symptoms worsen or if the condition fails to improve  as anticipated.  I provided face-to-face time during this encounter. We were at different locations.   Sonda Primes, MD

## 2023-04-11 DIAGNOSIS — N189 Chronic kidney disease, unspecified: Secondary | ICD-10-CM | POA: Diagnosis not present

## 2023-04-15 ENCOUNTER — Telehealth: Payer: Self-pay

## 2023-04-15 ENCOUNTER — Telehealth: Payer: Self-pay | Admitting: Internal Medicine

## 2023-04-15 ENCOUNTER — Ambulatory Visit (INDEPENDENT_AMBULATORY_CARE_PROVIDER_SITE_OTHER): Payer: Medicaid Other

## 2023-04-15 DIAGNOSIS — R197 Diarrhea, unspecified: Secondary | ICD-10-CM | POA: Diagnosis not present

## 2023-04-15 DIAGNOSIS — I482 Chronic atrial fibrillation, unspecified: Secondary | ICD-10-CM | POA: Diagnosis not present

## 2023-04-15 DIAGNOSIS — Z7901 Long term (current) use of anticoagulants: Secondary | ICD-10-CM

## 2023-04-15 DIAGNOSIS — R195 Other fecal abnormalities: Secondary | ICD-10-CM | POA: Diagnosis not present

## 2023-04-15 DIAGNOSIS — R630 Anorexia: Secondary | ICD-10-CM | POA: Diagnosis not present

## 2023-04-15 LAB — POCT INR: INR: 1.6 — AB (ref 2.0–3.0)

## 2023-04-15 NOTE — Progress Notes (Cosign Needed Addendum)
Pt recently on  Eliquis and Xarelto. Eliquis caused severe diarrhea so pt is returning to warfarin. Warfarin management was provided by [facility] cardiology but they no longer have a coumadin clinic.  [facility]in clinic is taking over management per PCP.  Nurse at [facility] reported they tested INR today and it was 1.4 and 1.6.  Pt restarted warfarin on 11/15, at 1.5 mg daily, and also overlapped Xarelto for 3 days, from 11/15-11/17.   Dosing instructions given today by phone to pt's daughter, [name]. She also said she will look at dosing instructions on mychart. She verbalized understanding and read back instructions.    Medical screening examination/treatment/procedure(s) were performed by non-physician practitioner and as supervising physician I was immediately available for consultation/collaboration.  I agree with above. Jacinta Shoe, MD

## 2023-04-15 NOTE — Telephone Encounter (Signed)
Received call for Alexis [name], clinical RN supervisor, with [facility] cardiology. Their coumadin clinic has managed pt's warfarin in the past. Recently pt was switched to Xarelto due to lack of appetite and erratic changes in diet and labile INRs. Cardiology then changed to Eliquis, due to her diagnoses they recommended Eliquis over Xarelto. Pt had diarrhea when switched to Eliquis and the pt's daughter is now asking for the pt to return to warfarin.   Pt has been transitioned to warfarin starting 11/15, with 1.5 mg warfarin daily, plus 3 days of Xarelto. Pt is currently taking 1.5 mg warfarin daily for the past 5 days and Xarelto has been stopped. INR was checked at the clinic today and it was 1.6. They have not advised the pt on dosing for INR result from today.They are asking this coumadin clinic to taking over management.   Brandy reports [facility] cardiology have closed their coumadin clinics and will no longer mange warfarin. They are requesting pt's PCP to take over warfarin management.  Advised this nurse will discuss with PCP to inquire if PCP is ok with managing warfarin and then f/u with her later this afternoon.

## 2023-04-15 NOTE — Patient Instructions (Addendum)
Pre visit review using our clinic review tool, if applicable. No additional management support is needed unless otherwise documented below in the visit note.  Increase dose today and Thursday to take 2 tablets (2 mg) each day, and take 1 1/2 tablets (1.5 mg) on Wednesday. Recheck INR on 11/22 at [facility], at [address] [place]. Hold warfarin on 11/22 until after INR check.

## 2023-04-15 NOTE — Telephone Encounter (Signed)
Discussed with Dr. Posey Rea and he will take over warfarin management for the pt. He is requesting this nurse manage warfarin in this coumadin clinic.    Dwaine Deter, RN Materials engineer, at [facility] cardiology and advised PCP/ [facility] coumadin clinic will take over management of warfarin. Advised if cardiology had any other suggestions to reach this coumadin clinic. Advised this nurse will f/u with pt and her daughter, [name], with dosing instructions from INR result this morning at her clinic, of 1.6 Brandy verbalized understanding and was appreciative of the help.    Contacted [name] and advised of warfarin management moving to PCP office. Provided direct number to coumadin clinic. Advised on warfarin dosing. Notes for dosing are in anticoagulation encounter. Made next coumadin clinic apt for 11/22 at [facility]. Advised if any changes to contact coumadin clinic and if any s/s of bleeding or abnormal bruising to take pt to ER. [name] verbalized understanding and was appreciative of the the call.

## 2023-04-16 DIAGNOSIS — M1990 Unspecified osteoarthritis, unspecified site: Secondary | ICD-10-CM | POA: Diagnosis not present

## 2023-04-16 DIAGNOSIS — Z7901 Long term (current) use of anticoagulants: Secondary | ICD-10-CM | POA: Diagnosis not present

## 2023-04-16 DIAGNOSIS — K591 Functional diarrhea: Secondary | ICD-10-CM | POA: Diagnosis not present

## 2023-04-16 DIAGNOSIS — Z66 Do not resuscitate: Secondary | ICD-10-CM | POA: Diagnosis not present

## 2023-04-16 DIAGNOSIS — E860 Dehydration: Secondary | ICD-10-CM | POA: Diagnosis not present

## 2023-04-16 DIAGNOSIS — E872 Acidosis, unspecified: Secondary | ICD-10-CM | POA: Diagnosis not present

## 2023-04-16 DIAGNOSIS — I130 Hypertensive heart and chronic kidney disease with heart failure and stage 1 through stage 4 chronic kidney disease, or unspecified chronic kidney disease: Secondary | ICD-10-CM | POA: Diagnosis not present

## 2023-04-16 DIAGNOSIS — R197 Diarrhea, unspecified: Secondary | ICD-10-CM | POA: Diagnosis not present

## 2023-04-16 DIAGNOSIS — E871 Hypo-osmolality and hyponatremia: Secondary | ICD-10-CM | POA: Diagnosis not present

## 2023-04-16 DIAGNOSIS — G9341 Metabolic encephalopathy: Secondary | ICD-10-CM | POA: Diagnosis not present

## 2023-04-16 DIAGNOSIS — N3090 Cystitis, unspecified without hematuria: Secondary | ICD-10-CM | POA: Diagnosis not present

## 2023-04-16 DIAGNOSIS — R8281 Pyuria: Secondary | ICD-10-CM | POA: Diagnosis not present

## 2023-04-16 DIAGNOSIS — Z881 Allergy status to other antibiotic agents status: Secondary | ICD-10-CM | POA: Diagnosis not present

## 2023-04-16 DIAGNOSIS — I340 Nonrheumatic mitral (valve) insufficiency: Secondary | ICD-10-CM | POA: Diagnosis not present

## 2023-04-16 DIAGNOSIS — I739 Peripheral vascular disease, unspecified: Secondary | ICD-10-CM | POA: Diagnosis not present

## 2023-04-16 DIAGNOSIS — I38 Endocarditis, valve unspecified: Secondary | ICD-10-CM | POA: Diagnosis not present

## 2023-04-16 DIAGNOSIS — K449 Diaphragmatic hernia without obstruction or gangrene: Secondary | ICD-10-CM | POA: Diagnosis not present

## 2023-04-16 DIAGNOSIS — K429 Umbilical hernia without obstruction or gangrene: Secondary | ICD-10-CM | POA: Diagnosis not present

## 2023-04-16 DIAGNOSIS — I2699 Other pulmonary embolism without acute cor pulmonale: Secondary | ICD-10-CM | POA: Diagnosis not present

## 2023-04-16 DIAGNOSIS — I420 Dilated cardiomyopathy: Secondary | ICD-10-CM | POA: Diagnosis not present

## 2023-04-16 DIAGNOSIS — I2510 Atherosclerotic heart disease of native coronary artery without angina pectoris: Secondary | ICD-10-CM | POA: Diagnosis not present

## 2023-04-16 DIAGNOSIS — N1832 Chronic kidney disease, stage 3b: Secondary | ICD-10-CM | POA: Diagnosis not present

## 2023-04-16 DIAGNOSIS — N390 Urinary tract infection, site not specified: Secondary | ICD-10-CM | POA: Diagnosis not present

## 2023-04-16 DIAGNOSIS — F418 Other specified anxiety disorders: Secondary | ICD-10-CM | POA: Diagnosis not present

## 2023-04-16 DIAGNOSIS — I5033 Acute on chronic diastolic (congestive) heart failure: Secondary | ICD-10-CM | POA: Diagnosis not present

## 2023-04-16 DIAGNOSIS — F0390 Unspecified dementia without behavioral disturbance: Secondary | ICD-10-CM | POA: Diagnosis not present

## 2023-04-16 DIAGNOSIS — D259 Leiomyoma of uterus, unspecified: Secondary | ICD-10-CM | POA: Diagnosis not present

## 2023-04-16 DIAGNOSIS — I482 Chronic atrial fibrillation, unspecified: Secondary | ICD-10-CM | POA: Diagnosis not present

## 2023-04-16 DIAGNOSIS — N179 Acute kidney failure, unspecified: Secondary | ICD-10-CM | POA: Diagnosis not present

## 2023-04-16 DIAGNOSIS — E876 Hypokalemia: Secondary | ICD-10-CM | POA: Diagnosis not present

## 2023-04-16 NOTE — Telephone Encounter (Signed)
error 

## 2023-04-17 DIAGNOSIS — Z7901 Long term (current) use of anticoagulants: Secondary | ICD-10-CM | POA: Diagnosis not present

## 2023-04-17 DIAGNOSIS — I5030 Unspecified diastolic (congestive) heart failure: Secondary | ICD-10-CM | POA: Diagnosis not present

## 2023-04-17 DIAGNOSIS — Z952 Presence of prosthetic heart valve: Secondary | ICD-10-CM | POA: Diagnosis not present

## 2023-04-17 DIAGNOSIS — I340 Nonrheumatic mitral (valve) insufficiency: Secondary | ICD-10-CM | POA: Diagnosis not present

## 2023-04-17 DIAGNOSIS — R8290 Unspecified abnormal findings in urine: Secondary | ICD-10-CM | POA: Diagnosis not present

## 2023-04-17 DIAGNOSIS — R197 Diarrhea, unspecified: Secondary | ICD-10-CM | POA: Diagnosis not present

## 2023-04-17 DIAGNOSIS — R413 Other amnesia: Secondary | ICD-10-CM | POA: Diagnosis not present

## 2023-04-17 DIAGNOSIS — I10 Essential (primary) hypertension: Secondary | ICD-10-CM | POA: Diagnosis not present

## 2023-04-17 DIAGNOSIS — Z951 Presence of aortocoronary bypass graft: Secondary | ICD-10-CM | POA: Diagnosis not present

## 2023-04-17 DIAGNOSIS — N179 Acute kidney failure, unspecified: Secondary | ICD-10-CM | POA: Diagnosis not present

## 2023-04-17 DIAGNOSIS — N183 Chronic kidney disease, stage 3 unspecified: Secondary | ICD-10-CM | POA: Diagnosis not present

## 2023-04-17 DIAGNOSIS — I482 Chronic atrial fibrillation, unspecified: Secondary | ICD-10-CM | POA: Diagnosis not present

## 2023-04-17 NOTE — Telephone Encounter (Signed)
Reviewing chart for coumadin clinic apt tomorrow. Pt's daughter has cancelled apt due to pt currently inpt for AKI.  Will continue review of chart for d/c instructions.

## 2023-04-17 NOTE — Telephone Encounter (Signed)
Thank you :)

## 2023-04-18 ENCOUNTER — Ambulatory Visit: Payer: Medicaid Other

## 2023-04-18 DIAGNOSIS — N179 Acute kidney failure, unspecified: Secondary | ICD-10-CM | POA: Diagnosis not present

## 2023-04-18 DIAGNOSIS — R197 Diarrhea, unspecified: Secondary | ICD-10-CM | POA: Diagnosis not present

## 2023-04-18 DIAGNOSIS — R8290 Unspecified abnormal findings in urine: Secondary | ICD-10-CM | POA: Diagnosis not present

## 2023-04-18 DIAGNOSIS — I10 Essential (primary) hypertension: Secondary | ICD-10-CM | POA: Diagnosis not present

## 2023-04-18 DIAGNOSIS — F03C0 Unspecified dementia, severe, without behavioral disturbance, psychotic disturbance, mood disturbance, and anxiety: Secondary | ICD-10-CM | POA: Diagnosis not present

## 2023-04-18 DIAGNOSIS — Z7901 Long term (current) use of anticoagulants: Secondary | ICD-10-CM | POA: Diagnosis not present

## 2023-04-18 DIAGNOSIS — I482 Chronic atrial fibrillation, unspecified: Secondary | ICD-10-CM | POA: Diagnosis not present

## 2023-04-18 DIAGNOSIS — I5030 Unspecified diastolic (congestive) heart failure: Secondary | ICD-10-CM | POA: Diagnosis not present

## 2023-04-18 DIAGNOSIS — Z952 Presence of prosthetic heart valve: Secondary | ICD-10-CM | POA: Diagnosis not present

## 2023-04-18 DIAGNOSIS — I340 Nonrheumatic mitral (valve) insufficiency: Secondary | ICD-10-CM | POA: Diagnosis not present

## 2023-04-18 DIAGNOSIS — R413 Other amnesia: Secondary | ICD-10-CM | POA: Diagnosis not present

## 2023-04-19 DIAGNOSIS — I10 Essential (primary) hypertension: Secondary | ICD-10-CM | POA: Diagnosis not present

## 2023-04-19 DIAGNOSIS — I5030 Unspecified diastolic (congestive) heart failure: Secondary | ICD-10-CM | POA: Diagnosis not present

## 2023-04-19 DIAGNOSIS — Z952 Presence of prosthetic heart valve: Secondary | ICD-10-CM | POA: Diagnosis not present

## 2023-04-19 DIAGNOSIS — R8290 Unspecified abnormal findings in urine: Secondary | ICD-10-CM | POA: Diagnosis not present

## 2023-04-19 DIAGNOSIS — I340 Nonrheumatic mitral (valve) insufficiency: Secondary | ICD-10-CM | POA: Diagnosis not present

## 2023-04-19 DIAGNOSIS — Z789 Other specified health status: Secondary | ICD-10-CM | POA: Diagnosis not present

## 2023-04-19 DIAGNOSIS — R197 Diarrhea, unspecified: Secondary | ICD-10-CM | POA: Diagnosis not present

## 2023-04-19 DIAGNOSIS — Z7901 Long term (current) use of anticoagulants: Secondary | ICD-10-CM | POA: Diagnosis not present

## 2023-04-19 DIAGNOSIS — F03C0 Unspecified dementia, severe, without behavioral disturbance, psychotic disturbance, mood disturbance, and anxiety: Secondary | ICD-10-CM | POA: Diagnosis not present

## 2023-04-19 DIAGNOSIS — R413 Other amnesia: Secondary | ICD-10-CM | POA: Diagnosis not present

## 2023-04-19 DIAGNOSIS — N179 Acute kidney failure, unspecified: Secondary | ICD-10-CM | POA: Diagnosis not present

## 2023-04-19 DIAGNOSIS — I482 Chronic atrial fibrillation, unspecified: Secondary | ICD-10-CM | POA: Diagnosis not present

## 2023-04-22 ENCOUNTER — Ambulatory Visit: Payer: Medicaid Other

## 2023-04-22 DIAGNOSIS — I482 Chronic atrial fibrillation, unspecified: Secondary | ICD-10-CM

## 2023-04-22 DIAGNOSIS — Z7901 Long term (current) use of anticoagulants: Secondary | ICD-10-CM

## 2023-04-22 LAB — POCT INR: INR: 3.6 — AB (ref 2.0–3.0)

## 2023-04-22 NOTE — Progress Notes (Addendum)
This is pt's first in office visit to the coumadin clinic.  Pt was hospitalized on 11/20 for AKI. Pt is doing much better now that she is home.  Hold dose today and then change weekly dose to take 1 1/2 tablets daily except take 1 tablet on Wednesday. Recheck INR in 1 week.   Medical screening examination/treatment/procedure(s) were performed by non-physician practitioner and as supervising physician I was immediately available for consultation/collaboration.  I agree with above. Jacinta Shoe, MD

## 2023-04-22 NOTE — Patient Instructions (Addendum)
Pre visit review using our clinic review tool, if applicable. No additional management support is needed unless otherwise documented below in the visit note.  Hold dose today and then change weekly dose to take 1 1/2 tablets daily except take 1 tablet on Wednesday. Recheck INR in 1 week.

## 2023-04-22 NOTE — Telephone Encounter (Signed)
Contacted pt's daughter, Alexis [name], concerning pt's warfarin management being taken over by hospice, per d/c instructions from [facility]. Alexis [name] reports they are not going to get hospice to come in. There is an issue with the insurance paying for it and she decided she does not think pt needs hospice at this point. She wants to continue with [facility] coumadin clinic and PCP for care and management of warfarin. She requested an apt today to check INR because it has not been checked since 11/23, when the pt was d/c from the hospital. Hospice was to check INR on 11/25 but Alexis [name] reports she did not want hospice at this time.   She also reports pt was on Bumex, and it helped with fluid very well, but in the hospital she was taken off of Bumex. She reports she can tell some of the fluid is coming back. She also reports pt is feeling much better and is talking as she was 1 month ago before she became ill.  Made an apt for INR check with coumadin clinic today. Pt also has hospital f/u with PCP tomorrow. Alexis [name] verbalized understanding about the coumadin clinic apt today. She was appreciative of the call.

## 2023-04-23 ENCOUNTER — Encounter: Payer: Self-pay | Admitting: Internal Medicine

## 2023-04-23 ENCOUNTER — Ambulatory Visit: Payer: Medicaid Other | Admitting: Internal Medicine

## 2023-04-23 VITALS — BP 120/60 | HR 81 | Temp 98.6°F | Ht <= 58 in

## 2023-04-23 DIAGNOSIS — G309 Alzheimer's disease, unspecified: Secondary | ICD-10-CM | POA: Diagnosis not present

## 2023-04-23 DIAGNOSIS — I10 Essential (primary) hypertension: Secondary | ICD-10-CM

## 2023-04-23 DIAGNOSIS — I509 Heart failure, unspecified: Secondary | ICD-10-CM

## 2023-04-23 DIAGNOSIS — F0280 Dementia in other diseases classified elsewhere without behavioral disturbance: Secondary | ICD-10-CM

## 2023-04-23 DIAGNOSIS — R531 Weakness: Secondary | ICD-10-CM

## 2023-04-23 DIAGNOSIS — R197 Diarrhea, unspecified: Secondary | ICD-10-CM | POA: Diagnosis not present

## 2023-04-23 LAB — COMPREHENSIVE METABOLIC PANEL
ALT: 16 U/L (ref 0–35)
AST: 22 U/L (ref 0–37)
Albumin: 3.3 g/dL — ABNORMAL LOW (ref 3.5–5.2)
Alkaline Phosphatase: 98 U/L (ref 39–117)
BUN: 13 mg/dL (ref 6–23)
CO2: 20 meq/L (ref 19–32)
Calcium: 9 mg/dL (ref 8.4–10.5)
Chloride: 109 meq/L (ref 96–112)
Creatinine, Ser: 1.17 mg/dL (ref 0.40–1.20)
GFR: 42.1 mL/min — ABNORMAL LOW (ref >60.00)
Glucose, Bld: 117 mg/dL — ABNORMAL HIGH (ref 70–99)
Potassium: 3.8 meq/L (ref 3.5–5.1)
Sodium: 136 meq/L (ref 135–145)
Total Bilirubin: 0.5 mg/dL (ref 0.2–1.2)
Total Protein: 6.5 g/dL (ref 6.0–8.3)

## 2023-04-23 LAB — CBC WITH DIFFERENTIAL/PLATELET
Basophils Absolute: 0 10*3/uL (ref 0.0–0.1)
Basophils Relative: 0.4 % (ref 0.0–3.0)
Eosinophils Absolute: 0.2 10*3/uL (ref 0.0–0.7)
Eosinophils Relative: 2.5 % (ref 0.0–5.0)
HCT: 36.6 % (ref 36.0–46.0)
Hemoglobin: 12.2 g/dL (ref 12.0–15.0)
Lymphocytes Relative: 11.2 % — ABNORMAL LOW (ref 12.0–46.0)
Lymphs Abs: 1.1 10*3/uL (ref 0.7–4.0)
MCHC: 33.4 g/dL (ref 30.0–36.0)
MCV: 91.5 fL (ref 78.0–100.0)
Monocytes Absolute: 0.7 10*3/uL (ref 0.1–1.0)
Monocytes Relative: 7.8 % (ref 3.0–12.0)
Neutro Abs: 7.3 10*3/uL (ref 1.4–7.7)
Neutrophils Relative %: 78.1 % — ABNORMAL HIGH (ref 43.0–77.0)
Platelets: 253 10*3/uL (ref 150.0–400.0)
RBC: 3.99 Mil/uL (ref 3.87–5.11)
RDW: 15.8 % — ABNORMAL HIGH (ref 11.5–15.5)
WBC: 9.4 10*3/uL (ref 4.0–10.5)

## 2023-04-23 LAB — SEDIMENTATION RATE: Sed Rate: 40 mm/h — ABNORMAL HIGH (ref 0–30)

## 2023-04-23 MED ORDER — BUDESONIDE 3 MG PO CPEP: 9.0000 mg | ORAL_CAPSULE | Freq: Every day | ORAL | 1 refills | Status: DC

## 2023-04-23 NOTE — Assessment & Plan Note (Signed)
Worse They would like to see a geriatrician for her Alzheimer's Options reviewed.  Will refer to the [facility] center

## 2023-04-23 NOTE — Progress Notes (Signed)
Subjective:  Patient ID: Alexis [name], female    DOB: May 09, 1934  Age: 89 y.o. MRN: [id number]  CC: Hospitalization Follow-up (Pts daughter is asking to have orders sent in to have a HH aide come in 3 times a week for 3-4 hours to help care for her mom.)   HPI Alexis [name] presents for FTT, anticoagulation, gout, CHF C/o diarrhea and low urinary output   "Discharge Summary  PCP: Alexis Garter, MD Discharge Details   Admit date: 04/16/2023 Discharge date and time: 04/19/2023 Hospital LOS: 4 days  Active Hospital Problems  Diagnosis Date Noted POA  *AKI (acute kidney injury) (*) 04/16/2023 Yes  Acute kidney injury (*) 04/17/2023 Yes  Diarrhea 04/17/2023 Yes  Chronic renal insufficiency, stage 3 (moderate) (*) 07/13/2019 Yes  Severe MR of bioprosthetic MV s/p VIV replacement with a 26mm Sapien S3 Ultra valve 06/01/19 04/28/2019 Yes  Congestive heart failure, diastolic, left, w/preserved LV function, NYHA class 4 (*) 04/14/2019 Yes  Abnormal urinalysis 04/13/2019 Yes  Hypertension Yes  S/P AVR (aortic valve replacement) 01/14/2019 Not Applicable  Hx of CABG - 2009 01/14/2019 Not Applicable  Chronic atrial fibrillation (*) 06/26/2017 Yes  Long-term (current) use of anticoagulants, INR goal 2.0-3.0 06/26/2017 Not Applicable  Memory loss 08/16/2013 Yes   Resolved Hospital Problems  Diagnosis Date Noted Date Resolved POA  Seizures (*) 04/13/2019 08/18/2019 Yes   Current Discharge Medication List    DISCONTINUED medications   AQUAPHOR (AQUAPHOR) ointment   bumetanide (BUMEX) 1 mg tablet   indomethacin (INDOCIN) 25 mg capsule   KLOR-CON M20 20 MEQ CR tablet     NEW medications  Details  diphenoxylate-atropine (LOMOTIL) 2.5-0.025 mg per tablet Take two tablets by mouth 4 (four) times a day as needed for Diarrhea. Max Daily Amount: 8 tablets Start date: 04/19/2023     CONTINUED medications  Details  acetaminophen (TYLENOL) 500 mg tablet Take  one tablet (500 mg dose) by mouth every 6 (six) hours as needed for Pain or Fever.   allopurinol (ZYLOPRIM) 100 mg tablet Take one half tablet (50 mg dose) by mouth every evening.   aspirin (ASPIRIN EC) EC tablet Take one tablet (81 mg dose) by mouth every morning.   atorvastatin calcium (LIPITOR) 10 mg tablet Take one tablet (10 mg dose) by mouth at bedtime.   B Complex CAPS Take 1 capsule by mouth every evening.   butalbital-acetaminophen-caffeine (ESGIC) 50-325-40 mg per tablet Take one tablet to two tablets by mouth every 6 (six) hours as needed.   cholecalciferol (VITAMIN D3) 1000 units (25 mcg) tablet Take two tablets (2,000 Units dose) by mouth every morning.   diphenhydraMINE-acetaminophen (TYLENOL PM) 25-500 MG TABS Take one tablet by mouth at bedtime as needed.   escitalopram (LEXAPRO) 5 MG tablet Take one tablet (5 mg dose) by mouth every evening.   ketoconazole (NIZORAL) 2 % cream one Application 2 (two) times a week.   loperamide (IMODIUM) 2 MG capsule Take one capsule (2 mg dose) by mouth 4 (four) times a day as needed for Diarrhea.   melatonin 3 mg TABS Take one tablet (3 mg dose) by mouth every evening.   metoprolol succinate (TOPROL-XL) 50 mg 24 hr tablet Take one tablet (50 mg dose) by mouth daily.   traMADol (ULTRAM) 50 mg tablet Take one tablet (50 mg dose) by mouth at bedtime.   triamcinolone acetonide (KENALOG) 0.1% cream Apply one g (1 Application dose) topically 2 (two) times a week.   !! warfarin sodium (COUMADIN,JANTOVEN) 1  mg tablet Take two tablets (2 mg dose) by mouth 2 (two) times a week. ** TUES AND THURS Alexis George, RN   !! warfarin sodium (COUMADIN,JANTOVEN) 1 mg tablet Take one and a half tablets (1.5 mg dose) by mouth see administration instructions. ** TAKES 1.5MG  OF WARFARIN EVERY DAY EXCEPT TUES AND THURS Alexis George, RN   !! - Potential duplicate medications found. Please discuss with provider.    Reason for medication  changes: Aquaphor and Indocin DC'd as patient was found to no longer be taking these medications upon arrival  We held Bumex and potassium at discharge until patient able to have repeat labs with hospice to make sure metabolic acidosis had resolved and defer the need to restart to hospice provider.  Hospital Course   Indication for Admission/chief complaint: Persistent diarrhea  HPI (from H&P): Alexis [name] is a 89 y.o. female Russian-speaking, with past medical history of streptococcal endocarditis, valvular heart disease (severe MR of bioprosthetic mitral valve-s/p VIV replacement with 26 mm SAPIEN S3 ultra valve 06/01/2019; s/p AVR bioprosthetic), CAD-s/p CABG 2009, s/p ASD closure, chronic A-fib-s/p LAA ligation, chronic anticoagulation (Coumadin), history of CVA CKD stage IIIb, severe PAD, hypertension, dementia, reportedly yesterday was seen in outpatient GI clinic because of already 2 weeks persistent diarrhea,(diarrhea reportedly coincided with attempted transition from Coumadin to DOAC) and lab work performed indicated worsening renal function, creatinine 2.3 (up from a baseline of 1.6) reason why she was directed to ER for further evaluation. Reportedly stool was tested in outpatient GI office-results pending.  In ED on arrival vital signs are stable with BP 136/64 HR 72, O2 sat 100% in room air. CT abdomen/pelvis in ED showed large coarsely calcified uterine fibroids with some mass effect on the distal sigmoid colon which is displaced posteriorly; urinary bladder cystitis-no acute findings in the kidneys which appear atrophic; extensive bilateral renal vascular calcifications. Lab work in ED: UA strongly suggestive of infection. Sodium 129, potassium 3.0, bicarb 14, creatinine 2.80, rest of CMP within normal range. Hemoglobin 12, hematocrit 33.7, platelet count 270,000, WBC 9.6  Yesterday 04/15/2023, INR was 1.4 although on 03/19/2023 INR was 5.0.  In ED patient received  fosfomycin 3 g x 1, oral potassium chloride 40 mill equivalents x 1 also 1 L IV NS.   Hospital Course and problem list format:   #AKI (resolved) on CKD 3B # Dehydration-resolved # Nonanion gap metabolic acidosis-improving She was admitted to [facility] on medical-telemetry unit. Baseline creatinine noted to be 1.3-1.6 but found to be up to 2.80 in ED upon arrival in setting of persistent diarrhea for the last 2 weeks as well as decreased fluid oral intake. On admission sodium level was found to be Per daughter, patient had not been taking any ibuprofen any longer for the last 1 year. On admission sodium level was found to be 129 and serum bicarb 14. Her prior home Bumex was held and she was initially treated with IVF but with noted nonanion gap metabolic acidosis she was transition to bicarb drip. Renal function was monitored in a serial fashion and nephrotoxic agents were avoided. With continued IV fluid resuscitation her creatinine level trended down to 1.30 on 11/22 and 1.10 on day of discharge. Her serum bicarb initially trended down but then improved to 17 on day of discharge. On day of discharge she was found to be medically and hemodynamically stable. At discharge it was ultimately decided to hold her prior home Bumex/potassium with continued metabolic acidosis  until her labs could be checked early next week with hospice to see if this had resolved and then we will defer the need to restart to hospice provider in outpatient setting.  #Persistent diarrhea Daughter endorsed her diarrhea started on 03/31/2023. She had had multiple medication changes daughter unclear if this was related or not. No overt bleeding however. Giardia and C. difficile gene negative checked outpatient on 04/15/2023. At home taking lately Lomotil/Imodium with some improvement. She has been on statin long-term. CT abdomen/pelvis: 1. Urinary bladder cystitis. No acute findings in the kidneys which appear atrophic. Extensive bilateral  renal vascular calcifications. 2. Large coarsely calcified uterine fibroids. GI BioFire/C. difficile collected on 11/21-unremarkable. She was treated with Lomotil as needed but with ongoing diarrhea we ultimately consulted gastroenterology, [facility], who saw patient on 11/22. Gastroenterology consult was greatly appreciated. They had a long discussion with daughter/patient at bedside and it came to light that the patient had had a significant and precipitous decline in her mental status and overall clinical status since a hospitalization about a month ago. Prior to arrival the patient did not have an appetite and was not eating much. Noted that when she managed to eat anything she would have solid stools and the diarrhea would resolve. Per GI based on all of the objective data it was felt the persistent diarrhea is likely the natural progression of the patient's dementia as she nears end-stage dementia with resultant bowel habit alterations in the setting of anorexia. Goals of care discussion was had with patient daughter and she noted she would like to avoid hospitalization and was asking about more ways to get support at home and was open to home hospice referral. We consulted case management on day of discharge and hospice referral was placed. They contacted patient's daughter via telephone and advised they would schedule an admission visit on Monday 11/25 at 1400 which they were agreeable to. Discussed with daughter she could continue using Imodium and/or Lomotil in outpatient setting as she previously had been; she endorsed she had both of these medications at home.   #UTI versus pyuria UA suggestive of infection. Received oral fosfomycin in ED. Per patient and daughter however, no dysuria or frequency. Unfortunately it did not appear UA was sent for UCX. As patient remained asymptomatic no further workup/treatment was undergone as she did receive oral fosfomycin in ED.  #Multi valvular heart disease (s/p  bioprosthetic MVR and AVR with subsequent MVR valve repair) Reportedly bioprosthetic valves.  #Chronic A-fib (LAA ligation)-chronic anticoagulation (Coumadin) Has been for years on Coumadin but INR 5.0 on 03/19/2023 at that time patient attempted to be changed to DOAC's (initially Xarelto, then Eliquis), but ultimately, for the last nearly 1 week is back on Coumadin, but subtherapeutic-INR 1.4 on 04/15/2023. Pharmacy team assisted with managing Coumadin while inpatient. INR was monitored daily. On admission she was initiated on a low rate heparin drip (no initial bolus) as bridging. Benefits and adverse effects discussed with patient and daughter and they are agreeable with. INR found to be at goal of 2.0 on 11/3 so heparin drip was stopped and patient was started back on previous Coumadin dosing (for the last nearly 1 week reportedly patient taking 2 mg on Tuesdays and Thursdays and 1.5 mg rest of the days-last Coumadin taken prior to come to ER was on Tuesday evening 04/15/2023). On day of discharge pharmacy recommended patient resume prior home Coumadin dosing. Hospice advised they would continue managing her Coumadin dosing/INR in outpatient setting as long as  patient remains on Coumadin.   #CAD-CABG/ #S/p ASD closure/ #History of HFpEF Most recent 2D echo October 2024 showed EF 50-55%, mild pulmonary hypertension, severe TR, moderately dilated LA). Denies any chest pain. No heart failure exacerbation noted. Actually clinically is dehydrated. Continue your home aspirin, Lipitor, Toprol XL. Continue holding prior home Bumex as discussed above.   #Hyponatremia-resolved Sodium 129 in ED. At home on Bumex. Presented with AKI and metabolic acidosis. Bumex was held during hospitalization and she was treated initially with IVF and then transition to bicarb drip. Sodium level normalized on 11/22 and remained stable at 137 on day of DC. Continue to monitor in outpatient setting with  hospice.  #Hypokalemia At home on daily potassium chloride 20 mill equivalents daily in setting of oral diuretics. Potassium 3.0 in ED suspected from diarrhea. Diuretics were held during hospitalization. She was treated with oral repletion for hypokalemia and potassium level improved to 3.5 on 11/21. On 11/22 potassium level found to be 5.2 and then drifted down to 3.6 on day of DC. She was given a one-time dose of oral supplementation on day of DC but as diuretics were going to be held discussed with daughter at present we will continue holding potassium supplementation until she is able to have BMP rechecked with hospice this coming week.   Dementia: No agitation during hospitalization. She was maintained on prior home Lexapro.    Recommendations to physicians/followup needed:  -We have requested outpatient follow-up with PCP -Hospice please recheck INR on Monday, 11/25, and provide further recommendations as needed -Hospice please recheck BMP on 11/25 and provide further recommendations of need to resume prior home oral Bumex/potassium supplementation  Physical Exam: Vitals:  04/19/23 1307  BP: (!) 126/56  Pulse: 70  Resp: 21  Temp:  SpO2: 96%   Constitutional -sitting on edge of bed appears comfortable, no acute distress Eyes - pupils equal round and reactive to light and accomodation Nose - no gross deformity or drainage Mouth - no oral lesions noted Throat - no swelling or erythema Neck - supple, no JVD  CV - (+)S1S2, irregular, rate controlled, no lower extremity pitting edema Resp - CTA bilaterally, no wheezing or crackles. No cough during exam GI - (+)BS, soft, non-tender, non-distended Extrem - no clubbing, cyanosis, or peripheral edema  Skin - no rashes or wounds Neuro - alert, aware, oriented to person. Is not, calm Psych -flat mood and affect.   Labs on Discharge:  Recent Labs  Units 04/18/23 1957 04/18/23 1012 04/17/23 1435 04/16/23 1250  WBC thou/mcL 9.0 7.4  7.5 9.6  HGB gm/dL 04.5 40.9 81.1 91.4  HCT % 41.1 34.4 33.7* 33.7*  PLT thou/mcL 238 246 234 270   Recent Labs  Units 04/19/23 0524 04/18/23 1506 04/17/23 1435 04/16/23 1250  NA mmol/L 137 141 135* 129*  K mmol/L 3.6* 5.2 3.5* 3.0*  CL mmol/L 108 115* 106 98  CO2 mmol/L 17* 11* 12* 14*  BUN mg/dL 18 26 36* 51*  CREATININE mg/dL 7.82* 9.56* 2.13* 0.86*  CALCIUM mg/dL 8.6 9.1 8.6 8.7   Recent Labs  Units 04/17/23 1435 04/16/23 1250  BILITOT mg/dL 0.3 0.2  AST U/L 15 17  ALT U/L 11 11  ALKPHOS U/L 99 106  ALBUMIN gm/dL 3.0* 3.6   Recent Labs  Units 04/17/23 1435  TSH mcIU/mL 1.15   Recent Labs  Units 04/19/23 0524 04/18/23 1012 04/17/23 0154 04/15/23 1034  INR 2.0 1.7 1.6 1.4*  PTT second(s) -- -- 32 --  No results for input(s): "CHOL", "LDL", "HDL", "TRIG" in the last 168 hours. No results for input(s): "TROPONIN", "CK" in the last 168 hours.  Invalid input(s): "CK-MB"  Diagnostics: XR Abdomen Portable  Result Date: 04/18/2023 INDICATION: Diarrhea COMPARISON: CT 04/16/2023 TECHNIQUE: AP views of the abdomen on 2 films FINDINGS: Gas-filled small bowel and colon with no definite distention. Calcified uterine leiomyoma noted. Repeat hardware in the proximal aspect of the right and left femur. Splenic artery calcifications. Mitral valve replacement.   IMPRESSION: 1. Nonspecific bowel gas pattern. Gas-filled small bowel and colon with no definite distention. Electronically Signed by: Marlise Eves MD on 04/18/2023 5:25 PM   Post Hospital Care   Discharge Procedure Orders  Follow up with Primary Care Physician:  Referral Priority: Urgent Referral Type: Consultation  Referral Reason: Transfer of Care for Problem  Number of Visits Requested: 1 Expiration Date: 10/15/23 "   Outpatient Medications Prior to Visit  Medication Sig Dispense Refill   acetaminophen (TYLENOL) 500 MG tablet Take 500 mg by mouth every 6 (six) hours as needed.     allopurinol  (ZYLOPRIM) 100 MG tablet Take 0.5 tablets (50 mg total) by mouth daily. Annual appt is due must see provider for future refills 45 tablet 3   aspirin EC 81 MG tablet Take 81 mg by mouth daily.     atorvastatin (LIPITOR) 10 MG tablet Take 1 tablet (10 mg total) by mouth at bedtime. 90 tablet 3   B Complex CAPS Take 1 capsule by mouth every evening.     bumetanide (BUMEX) 1 MG tablet Take 1 tablet (1 mg total) by mouth 2 (two) times daily. 180 tablet 1   butalbital-acetaminophen-caffeine (FIORICET) 50-325-40 MG tablet Take 1-2 tablets by mouth every 6 (six) hours as needed for headache. 60 tablet 0   Cholecalciferol (VITAMIN D3) 50 MCG (2000 UT) capsule Take 1 capsule (2,000 Units total) by mouth daily. 100 capsule 3   diphenoxylate-atropine (LOMOTIL) 2.5-0.025 MG tablet Take 1 tablet by mouth 4 (four) times daily as needed for diarrhea or loose stools. 20 tablet 1   escitalopram (LEXAPRO) 5 MG tablet Take 1 tablet (5 mg total) by mouth daily. Follow-up appt due w/labs must see provider for future refills 90 tablet 1   ketoconazole (NIZORAL) 2 % cream Apply 1 Application topically 2 (two) times daily.     loperamide (IMODIUM) 2 MG capsule Take 2 mg by mouth as needed.     melatonin 3 MG TABS tablet Take 3 mg by mouth at bedtime as needed.     metoprolol succinate (TOPROL-XL) 50 MG 24 hr tablet Take 1 tablet (50 mg total) by mouth daily. Overdue for appt due must see provider for future refills 90 tablet 3   mineral oil-hydrophilic petrolatum (AQUAPHOR) ointment Apply topically as needed for dry skin. 420 g 3   mupirocin ointment (BACTROBAN) 2 % Apply 1 Application topically 2 (two) times daily.     potassium chloride SA (KLOR-CON M) 20 MEQ tablet Take 1 tablet (20 mEq total) by mouth daily. 90 tablet 1   traMADol (ULTRAM) 50 MG tablet Take 1-2 tablets (50-100 mg total) by mouth every 12 (twelve) hours as needed. 120 tablet 1   triamcinolone cream (KENALOG) 0.1 % Apply 1 Application topically 2 (two)  times daily. 160 g 3   warfarin (COUMADIN) 1 MG tablet Take 1 mg by mouth.     XARELTO 15 MG TABS tablet Take 1 tablet (15 mg total) by mouth daily. 30 tablet 5  No facility-administered medications prior to visit.    ROS: Review of Systems  Constitutional:  Negative for activity change, appetite change, chills, fatigue and unexpected weight change.  HENT:  Negative for congestion, mouth sores and sinus pressure.   Eyes:  Negative for visual disturbance.  Respiratory:  Negative for cough and chest tightness.   Gastrointestinal:  Positive for diarrhea. Negative for abdominal pain and nausea.  Genitourinary:  Negative for difficulty urinating, frequency, urgency and vaginal pain.  Musculoskeletal:  Negative for back pain and gait problem.  Skin:  Negative for pallor and rash.  Neurological:  Negative for dizziness, tremors, weakness, numbness and headaches.  Psychiatric/Behavioral:  Negative for confusion and sleep disturbance.     Objective:  BP 120/60 (BP Location: Left Arm, Patient Position: Sitting, Cuff Size: Normal)   Pulse 81   Temp 98.6 F (37 C) (Oral)   Ht 4\' 10"  (1.473 m)   SpO2 95%   BMI 35.11 kg/m   BP Readings from Last 3 Encounters:  04/23/23 120/60  03/11/23 110/60  07/10/22 120/68    Wt Readings from Last 3 Encounters:  03/11/23 168 lb (76.2 kg)  03/03/23 180 lb 9.6 oz (81.9 kg)  07/10/22 169 lb (76.7 kg)    Physical Exam Constitutional:      General: She is not in acute distress.    Appearance: She is well-developed. She is obese.  HENT:     Head: Normocephalic.     Right Ear: External ear normal.     Left Ear: External ear normal.     Nose: Nose normal.  Eyes:     General:        Right eye: No discharge.        Left eye: No discharge.     Conjunctiva/sclera: Conjunctivae normal.     Pupils: Pupils are equal, round, and reactive to light.  Neck:     Thyroid: No thyromegaly.     Vascular: No JVD.     Trachea: No tracheal deviation.   Cardiovascular:     Rate and Rhythm: Normal rate and regular rhythm.     Heart sounds: Normal heart sounds.  Pulmonary:     Effort: No respiratory distress.     Breath sounds: No stridor. No wheezing.  Abdominal:     General: Bowel sounds are normal. There is no distension.     Palpations: Abdomen is soft. There is no mass.     Tenderness: There is no abdominal tenderness. There is no guarding or rebound.  Musculoskeletal:        General: No tenderness.     Cervical back: Normal range of motion and neck supple. No rigidity.  Lymphadenopathy:     Cervical: No cervical adenopathy.  Skin:    Findings: No erythema or rash.  Neurological:     Cranial Nerves: No cranial nerve deficit.     Motor: No abnormal muscle tone.     Coordination: Coordination normal.     Deep Tendon Reflexes: Reflexes normal.  Psychiatric:        Behavior: Behavior normal.        Thought Content: Thought content normal.        Judgment: Judgment normal.     Lab Results  Component Value Date   WBC 7.6 03/03/2023   HGB 12.8 03/03/2023   HCT 38.8 03/03/2023   PLT 250.0 03/03/2023   GLUCOSE 109 (H) 03/03/2023   CHOL 183 03/03/2023   TRIG 227.0 (H) 03/03/2023   HDL  52.10 03/03/2023   LDLDIRECT 90.0 01/10/2021   LDLCALC 86 03/03/2023   ALT 12 03/03/2023   AST 24 03/03/2023   NA 138 03/03/2023   K 4.5 03/03/2023   CL 103 03/03/2023   CREATININE 1.27 (H) 03/03/2023   BUN 19 03/03/2023   CO2 27 03/03/2023   TSH 3.07 03/03/2023   INR 3.6 (A) 04/22/2023   HGBA1C 6.0 03/03/2023    CT ANKLE RIGHT WO CONTRAST  Result Date: 06/15/2021 CLINICAL DATA:  Evaluate right ankle fracture EXAM: CT OF THE RIGHT ANKLE WITHOUT CONTRAST TECHNIQUE: Multidetector CT imaging of the right ankle was performed according to the standard protocol. Multiplanar CT image reconstructions were also generated. RADIATION DOSE REDUCTION: This exam was performed according to the departmental dose-optimization program which includes  automated exposure control, adjustment of the mA and/or kV according to patient size and/or use of iterative reconstruction technique. COMPARISON:  X-ray 06/11/2021 FINDINGS: Bones/Joint/Cartilage Acute comminuted nondisplaced fracture of the medial malleolus with horizontal and vertical components. Obliquely oriented nondisplaced fracture through the lateral malleolus. No posterior malleolar fracture component. Ankle mortise is congruent without widening or dislocation. Talus and calcaneus are intact. Subtalar joints are aligned. No acute fracture or malalignment within the midfoot. Moderate degenerative changes are most pronounced at the second through fourth tarsometatarsal joints. Bones are demineralized. Ligaments Suboptimally assessed by CT. Muscles and Tendons No acute musculotendinous abnormality by CT. Soft tissues Soft tissue swelling and edema. Small hematoma overlies the lateral malleolar fracture site measuring approximately 3.6 x 0.9 cm. Atherosclerotic vascular calcifications are present. IMPRESSION: 1. Acute comminuted nondisplaced fractures of the medial and lateral malleoli, as described above. 2. Small hematoma overlies the lateral malleolar fracture site. Electronically Signed   By: Duanne Guess D.O.   On: 06/15/2021 08:42    Assessment & Plan:   Problem List Items Addressed This Visit     Essential hypertension    BP Readings from Last 3 Encounters:  04/23/23 120/60  03/11/23 110/60  07/10/22 120/68         Relevant Medications   aspirin EC 81 MG tablet   warfarin (COUMADIN) 1 MG tablet   Congestive heart failure (HCC)     F/u w/Dr Mayford Knife Furosemide was stopped Given Bumex, Torsemide prn      Relevant Medications   aspirin EC 81 MG tablet   warfarin (COUMADIN) 1 MG tablet   Other Relevant Orders   Ambulatory referral to Home Health   Diarrhea - Primary    Refractory On fiber Try Budesonide 9 mg/d po empirically F/u w/GI      Alzheimer disease (HCC)     Worse They would like to see a geriatrician for her Alzheimer's Options reviewed.  Will refer [facility]      Relevant Orders   Ambulatory referral to Home Health   Weakness    Better after IVF         Meds ordered this encounter  Medications   budesonide (ENTOCORT EC) 3 MG 24 hr capsule    Sig: Take 3 capsules (9 mg total) by mouth daily.    Dispense:  90 capsule    Refill:  1      Follow-up: No follow-ups on file.  Sonda Primes, MD

## 2023-04-23 NOTE — Assessment & Plan Note (Signed)
Better after IVF

## 2023-04-23 NOTE — Assessment & Plan Note (Signed)
  F/u w/Dr Mayford Knife Furosemide was stopped Given Bumex, Torsemide prn

## 2023-04-23 NOTE — Telephone Encounter (Signed)
Noted! Thank you

## 2023-04-23 NOTE — Assessment & Plan Note (Signed)
BP Readings from Last 3 Encounters:  04/23/23 120/60  03/11/23 110/60  07/10/22 120/68

## 2023-04-23 NOTE — Assessment & Plan Note (Addendum)
Refractory On fiber Try Budesonide 9 mg/d po empirically F/u w/GI

## 2023-04-29 ENCOUNTER — Ambulatory Visit: Payer: Medicaid Other

## 2023-04-29 DIAGNOSIS — Z7901 Long term (current) use of anticoagulants: Secondary | ICD-10-CM | POA: Diagnosis not present

## 2023-04-29 LAB — POCT INR: INR: 4.5 — AB (ref 2.0–3.0)

## 2023-04-29 NOTE — Patient Instructions (Addendum)
Pre visit review using our clinic review tool, if applicable. No additional management support is needed unless otherwise documented below in the visit note.  Hold dose today and hold dose tomorrow and then change weekly dose to take 1 tablets daily except take 1 1/2 tablets on Mondays and Thursdays. Recheck INR in 1 week.

## 2023-04-29 NOTE — Progress Notes (Cosign Needed Addendum)
Pt's daughter, [name], reports pt is doing very well. She is eating better, playing suduko, and having normal bowel/urinary habits. She is also sleeping better and able to go to the bathroom and not just the bedside commode. Hold dose today and hold dose tomorrow and then change weekly dose to take 1 tablets daily except take 1 1/2 tablets on Mondays and Thursdays. Recheck INR in 1 week.  [name] denies pt having any s/s of bleeding or abnormal bruising. Advised if any s/s to call 911 or take pt to ER. [name] verbalized understanding.   Advised [name] that pt is to have a f/u with PCP. Scheduled coumadin clinic apt and f/u apt with PCP for one week.   Medical screening examination/treatment/procedure(s) were performed by non-physician practitioner and as supervising physician I was immediately available for consultation/collaboration.  I agree with above. Jacinta Shoe, MD

## 2023-05-06 ENCOUNTER — Encounter: Payer: Self-pay | Admitting: Internal Medicine

## 2023-05-06 ENCOUNTER — Ambulatory Visit: Payer: Medicaid Other

## 2023-05-06 ENCOUNTER — Ambulatory Visit: Payer: Medicaid Other | Admitting: Internal Medicine

## 2023-05-06 VITALS — BP 116/70 | HR 54 | Temp 98.2°F | Ht 59.0 in | Wt 171.6 lb

## 2023-05-06 DIAGNOSIS — Z7901 Long term (current) use of anticoagulants: Secondary | ICD-10-CM | POA: Diagnosis not present

## 2023-05-06 DIAGNOSIS — F0280 Dementia in other diseases classified elsewhere without behavioral disturbance: Secondary | ICD-10-CM | POA: Diagnosis not present

## 2023-05-06 DIAGNOSIS — I10 Essential (primary) hypertension: Secondary | ICD-10-CM | POA: Diagnosis not present

## 2023-05-06 DIAGNOSIS — R197 Diarrhea, unspecified: Secondary | ICD-10-CM

## 2023-05-06 DIAGNOSIS — G309 Alzheimer's disease, unspecified: Secondary | ICD-10-CM

## 2023-05-06 DIAGNOSIS — R413 Other amnesia: Secondary | ICD-10-CM | POA: Diagnosis not present

## 2023-05-06 DIAGNOSIS — I38 Endocarditis, valve unspecified: Secondary | ICD-10-CM

## 2023-05-06 LAB — POCT INR: INR: 1.8 — AB (ref 2.0–3.0)

## 2023-05-06 NOTE — Assessment & Plan Note (Addendum)
Better They would like to see a geriatrician for her Alzheimer's Options reviewed.  Will refer to the [facility] center

## 2023-05-06 NOTE — Assessment & Plan Note (Signed)
On Coumadin 

## 2023-05-06 NOTE — Assessment & Plan Note (Signed)
BP Readings from Last 3 Encounters:  05/06/23 116/70  04/23/23 120/60  03/11/23 110/60

## 2023-05-06 NOTE — Assessment & Plan Note (Signed)
  On fiber Better on Budesonide 9 mg/d po empirically F/u w/GI

## 2023-05-06 NOTE — Progress Notes (Signed)
Subjective:  Patient ID: Alexis [name], female    DOB: Sep 08, 1933  Age: 89 y.o. MRN: [id number]  CC: Follow-up   HPI Giulia Holtsclaw presents for diarrhea - better on Budesonide F/u anticoagulation Bumex may have caused diarrhea...  Outpatient Medications Prior to Visit  Medication Sig Dispense Refill   acetaminophen (TYLENOL) 500 MG tablet Take 500 mg by mouth every 6 (six) hours as needed.     allopurinol (ZYLOPRIM) 100 MG tablet Take 0.5 tablets (50 mg total) by mouth daily. Annual appt is due must see provider for future refills 45 tablet 3   aspirin EC 81 MG tablet Take 81 mg by mouth daily.     atorvastatin (LIPITOR) 10 MG tablet Take 1 tablet (10 mg total) by mouth at bedtime. 90 tablet 3   B Complex CAPS Take 1 capsule by mouth every evening.     budesonide (ENTOCORT EC) 3 MG 24 hr capsule Take 3 capsules (9 mg total) by mouth daily. 90 capsule 1   butalbital-acetaminophen-caffeine (FIORICET) 50-325-40 MG tablet Take 1-2 tablets by mouth every 6 (six) hours as needed for headache. 60 tablet 0   Cholecalciferol (VITAMIN D3) 50 MCG (2000 UT) capsule Take 1 capsule (2,000 Units total) by mouth daily. 100 capsule 3   diphenoxylate-atropine (LOMOTIL) 2.5-0.025 MG tablet Take 1 tablet by mouth 4 (four) times daily as needed for diarrhea or loose stools. 20 tablet 1   escitalopram (LEXAPRO) 5 MG tablet Take 1 tablet (5 mg total) by mouth daily. Follow-up appt due w/labs must see provider for future refills 90 tablet 1   ketoconazole (NIZORAL) 2 % cream Apply 1 Application topically 2 (two) times daily.     loperamide (IMODIUM) 2 MG capsule Take 2 mg by mouth as needed.     melatonin 3 MG TABS tablet Take 3 mg by mouth at bedtime as needed.     metoprolol succinate (TOPROL-XL) 50 MG 24 hr tablet Take 1 tablet (50 mg total) by mouth daily. Overdue for appt due must see provider for future refills 90 tablet 3   mineral oil-hydrophilic petrolatum (AQUAPHOR) ointment Apply  topically as needed for dry skin. 420 g 3   mupirocin ointment (BACTROBAN) 2 % Apply 1 Application topically 2 (two) times daily.     potassium chloride SA (KLOR-CON M) 20 MEQ tablet Take 1 tablet (20 mEq total) by mouth daily. 90 tablet 1   traMADol (ULTRAM) 50 MG tablet Take 1-2 tablets (50-100 mg total) by mouth every 12 (twelve) hours as needed. 120 tablet 1   triamcinolone cream (KENALOG) 0.1 % Apply 1 Application topically 2 (two) times daily. 160 g 3   warfarin (COUMADIN) 1 MG tablet Take 1 mg by mouth.     XARELTO 15 MG TABS tablet Take 1 tablet (15 mg total) by mouth daily. 30 tablet 5   bumetanide (BUMEX) 1 MG tablet Take 1 tablet (1 mg total) by mouth 2 (two) times daily. 180 tablet 1   No facility-administered medications prior to visit.    ROS: Review of Systems  Constitutional:  Negative for activity change, appetite change, chills, fatigue and unexpected weight change.  HENT:  Negative for congestion, mouth sores and sinus pressure.   Eyes:  Negative for visual disturbance.  Respiratory:  Negative for cough and chest tightness.   Gastrointestinal:  Negative for abdominal pain, blood in stool, constipation, diarrhea and nausea.  Genitourinary:  Positive for frequency. Negative for difficulty urinating and vaginal pain.  Musculoskeletal:  Negative for  back pain and gait problem.  Skin:  Negative for pallor and rash.  Neurological:  Negative for dizziness, tremors, weakness, numbness and headaches.  Psychiatric/Behavioral:  Negative for confusion and sleep disturbance.     Objective:  BP 116/70   Pulse (!) 54   Temp 98.2 F (36.8 C) (Oral)   Ht 4\' 11"  (1.499 m)   Wt 171 lb 9.6 oz (77.8 kg)   SpO2 95%   BMI 34.66 kg/m   BP Readings from Last 3 Encounters:  05/06/23 116/70  04/23/23 120/60  03/11/23 110/60    Wt Readings from Last 3 Encounters:  05/06/23 171 lb 9.6 oz (77.8 kg)  03/11/23 168 lb (76.2 kg)  03/03/23 180 lb 9.6 oz (81.9 kg)    Physical  Exam Constitutional:      General: She is not in acute distress.    Appearance: She is well-developed. She is obese.  HENT:     Head: Normocephalic.     Right Ear: External ear normal.     Left Ear: External ear normal.     Nose: Nose normal.  Eyes:     General:        Right eye: No discharge.        Left eye: No discharge.     Conjunctiva/sclera: Conjunctivae normal.     Pupils: Pupils are equal, round, and reactive to light.  Neck:     Thyroid: No thyromegaly.     Vascular: No JVD.     Trachea: No tracheal deviation.  Cardiovascular:     Rate and Rhythm: Normal rate and regular rhythm.     Heart sounds: Normal heart sounds.  Pulmonary:     Effort: No respiratory distress.     Breath sounds: No stridor. No wheezing.  Abdominal:     General: Bowel sounds are normal. There is no distension.     Palpations: Abdomen is soft. There is no mass.     Tenderness: There is no abdominal tenderness. There is no guarding or rebound.  Musculoskeletal:        General: No tenderness.     Cervical back: Normal range of motion and neck supple. No rigidity.  Lymphadenopathy:     Cervical: No cervical adenopathy.  Skin:    Findings: No erythema or rash.  Neurological:     Cranial Nerves: No cranial nerve deficit.     Motor: No abnormal muscle tone.     Coordination: Coordination normal.     Deep Tendon Reflexes: Reflexes normal.  Psychiatric:        Behavior: Behavior normal.        Thought Content: Thought content normal.        Judgment: Judgment normal.     Lab Results  Component Value Date   WBC 9.4 04/23/2023   HGB 12.2 04/23/2023   HCT 36.6 04/23/2023   PLT 253.0 04/23/2023   GLUCOSE 117 (H) 04/23/2023   CHOL 183 03/03/2023   TRIG 227.0 (H) 03/03/2023   HDL 52.10 03/03/2023   LDLDIRECT 90.0 01/10/2021   LDLCALC 86 03/03/2023   ALT 16 04/23/2023   AST 22 04/23/2023   NA 136 04/23/2023   K 3.8 04/23/2023   CL 109 04/23/2023   CREATININE 1.17 04/23/2023   BUN 13  04/23/2023   CO2 20 04/23/2023   TSH 3.07 03/03/2023   INR 1.8 (A) 05/06/2023   HGBA1C 6.0 03/03/2023    CT ANKLE RIGHT WO CONTRAST  Result Date: 06/15/2021 CLINICAL DATA:  Evaluate right ankle fracture EXAM: CT OF THE RIGHT ANKLE WITHOUT CONTRAST TECHNIQUE: Multidetector CT imaging of the right ankle was performed according to the standard protocol. Multiplanar CT image reconstructions were also generated. RADIATION DOSE REDUCTION: This exam was performed according to the departmental dose-optimization program which includes automated exposure control, adjustment of the mA and/or kV according to patient size and/or use of iterative reconstruction technique. COMPARISON:  X-ray 06/11/2021 FINDINGS: Bones/Joint/Cartilage Acute comminuted nondisplaced fracture of the medial malleolus with horizontal and vertical components. Obliquely oriented nondisplaced fracture through the lateral malleolus. No posterior malleolar fracture component. Ankle mortise is congruent without widening or dislocation. Talus and calcaneus are intact. Subtalar joints are aligned. No acute fracture or malalignment within the midfoot. Moderate degenerative changes are most pronounced at the second through fourth tarsometatarsal joints. Bones are demineralized. Ligaments Suboptimally assessed by CT. Muscles and Tendons No acute musculotendinous abnormality by CT. Soft tissues Soft tissue swelling and edema. Small hematoma overlies the lateral malleolar fracture site measuring approximately 3.6 x 0.9 cm. Atherosclerotic vascular calcifications are present. IMPRESSION: 1. Acute comminuted nondisplaced fractures of the medial and lateral malleoli, as described above. 2. Small hematoma overlies the lateral malleolar fracture site. Electronically Signed   By: Duanne Guess D.O.   On: 06/15/2021 08:42    Assessment & Plan:   Problem List Items Addressed This Visit     Essential hypertension    BP Readings from Last 3 Encounters:   05/06/23 116/70  04/23/23 120/60  03/11/23 110/60         Memory loss    Better       Diarrhea     On fiber Better on Budesonide 9 mg/d po empirically F/u w/GI      Alzheimer disease (HCC) - Primary    Better They would like to see a geriatrician for her Alzheimer's Options reviewed.  Will refer to the [facility] center      VHD (valvular heart disease)    On Coumadin         No orders of the defined types were placed in this encounter.     Follow-up: No follow-ups on file.  Sonda Primes, MD

## 2023-05-06 NOTE — Progress Notes (Cosign Needed Addendum)
Pt's daughter, [name], reports pt is doing very well. She is eating better, playing suduko, and having normal bowel/urinary habits. She is also sleeping better and able to go to the bathroom and not just the bedside commode. Pt also has PCP apt today.  Increase dose today to take 1 1/2 tablets and then continue 1 tablets daily except take 1 1/2 tablets on Mondays and Thursdays. Recheck INR in 1 week.   Medical screening examination/treatment/procedure(s) were performed by non-physician practitioner and as supervising physician I was immediately available for consultation/collaboration.  I agree with above. Jacinta Shoe, MD

## 2023-05-06 NOTE — Assessment & Plan Note (Signed)
Better  

## 2023-05-06 NOTE — Patient Instructions (Addendum)
Pre visit review using our clinic review tool, if applicable. No additional management support is needed unless otherwise documented below in the visit note.  Increase dose today to take 1 1/2 tablets and then continue 1 tablets daily except take 1 1/2 tablets on Mondays and Thursdays. Recheck INR in 1 week.

## 2023-05-08 DIAGNOSIS — Z789 Other specified health status: Secondary | ICD-10-CM | POA: Diagnosis not present

## 2023-05-08 DIAGNOSIS — K529 Noninfective gastroenteritis and colitis, unspecified: Secondary | ICD-10-CM | POA: Diagnosis not present

## 2023-05-16 ENCOUNTER — Ambulatory Visit (INDEPENDENT_AMBULATORY_CARE_PROVIDER_SITE_OTHER): Payer: Medicaid Other

## 2023-05-16 DIAGNOSIS — Z7901 Long term (current) use of anticoagulants: Secondary | ICD-10-CM | POA: Diagnosis not present

## 2023-05-16 LAB — POCT INR: INR: 1.9 — AB (ref 2.0–3.0)

## 2023-05-16 NOTE — Patient Instructions (Addendum)
Pre visit review using our clinic review tool, if applicable. No additional management support is needed unless otherwise documented below in the visit note.  Increase dose today to take 1 1/2 tablets and then change weekly dose to take 1 tablets daily except take 1 1/2 tablets on Mondays, Wednesdays and Fridays. Recheck INR in 2 week.

## 2023-05-16 NOTE — Progress Notes (Signed)
Pt's daughter, [name], reports pt has not had much of an appetite since last apt but is still eating. Increase dose today to take 1 1/2 tablets and then change weekly dose to take 1 tablets daily except take 1 1/2 tablets on Mondays, Wednesdays and Fridays. Recheck INR in 2 week.

## 2023-05-30 ENCOUNTER — Ambulatory Visit: Payer: Medicaid Other

## 2023-05-30 DIAGNOSIS — Z7901 Long term (current) use of anticoagulants: Secondary | ICD-10-CM | POA: Diagnosis not present

## 2023-05-30 LAB — POCT INR: INR: 2.1 (ref 2.0–3.0)

## 2023-05-30 MED ORDER — WARFARIN SODIUM 1 MG PO TABS: ORAL_TABLET | ORAL | 1 refills | Status: DC

## 2023-05-30 NOTE — Progress Notes (Addendum)
 Continue 1 tablets daily except take 1 1/2 tablets on Mondays, Wednesdays and Fridays. Recheck INR in 4 week.   Pt is compliant with warfarin management and PCP apts.  Sent in refill of warfarin to requested pharmacy.

## 2023-05-30 NOTE — Patient Instructions (Addendum)
 Pre visit review using our clinic review tool, if applicable. No additional management support is needed unless otherwise documented below in the visit note.  Continue 1 tablets daily except take 1 1/2 tablets on Mondays, Wednesdays and Fridays. Recheck INR in 4 week.

## 2023-06-03 ENCOUNTER — Ambulatory Visit: Payer: Self-pay | Admitting: Internal Medicine

## 2023-06-03 ENCOUNTER — Ambulatory Visit: Payer: Medicaid Other | Admitting: Family Medicine

## 2023-06-03 ENCOUNTER — Encounter: Payer: Self-pay | Admitting: Family Medicine

## 2023-06-03 VITALS — BP 118/74 | HR 68 | Temp 97.7°F | Ht 59.0 in | Wt 173.2 lb

## 2023-06-03 DIAGNOSIS — L03031 Cellulitis of right toe: Secondary | ICD-10-CM | POA: Diagnosis not present

## 2023-06-03 MED ORDER — CEPHALEXIN 500 MG PO CAPS: 500.0000 mg | ORAL_CAPSULE | Freq: Four times a day (QID) | ORAL | 0 refills | Status: AC

## 2023-06-03 NOTE — Telephone Encounter (Signed)
  Chief Complaint: Right great toe  Symptoms: right great toe pain, swelling, redness with  dark spot and yellow drainage along nailbed  Frequency: Since Sunday (today is 3rd day)  Pertinent Negatives: Patient's daughter denies rash, fever, numbness or pain in right leg.  Disposition: [] ED /[] Urgent Care (no appt availability in office) / [x] Appointment(In office/virtual)/ []  [facility]/ [] Home Care/ [] Refused Recommended Disposition /[] [facility]/ []  Follow-up with PCP Additional Notes: Patient's daughter agreeable to office visit, and requesting appointment today. No available appointments today with PCP office, agreeable to [facility].   Copied from CRM [id number]. Topic: Clinical - Red Word Triage >> Jun 03, 2023  9:30 AM Cherylynn B wrote: [facility] that prompted transfer to Nurse Triage: Patients daughter called- Mothers right foot big toe swollen, painful, and discolored. First noticed on Sunday 1/5 and it has gotten worse. Not sure if related to gout. Reason for Disposition  Yellow pus seen in skin around toenail (cuticle area), or pus seen under toenail  Answer Assessment - Initial Assessment Questions 1. ONSET: When did the pain start?      Since Sunday.  2. LOCATION: Where is the pain located?   (e.g., around nail, entire toe, at foot joint)      Right foot, great toe along nailbed.  3. PAIN: How bad is the pain?    (Scale 1-10; or mild, moderate, severe)   -  MILD (1-3): doesn't interfere with normal activities    -  MODERATE (4-7): interferes with normal activities (e.g., work or school) or awakens from sleep, limping    -  SEVERE (8-10): excruciating pain, unable to do any normal activities, unable to walk     7-8/10.  4. APPEARANCE: What does the toe look like? (e.g., redness, swelling, bruising, pallor)     Redness along nailbed of right great toe with dark spot on bottom right great toe. Daughter states there is a little bit of  swelling but it has improved since Sunday. Yellow liquid discharge from nail on Sunday.  5. CAUSE: What do you think is causing the toe pain?     Daughter thinks it is a gout flare up.  6. OTHER SYMPTOMS: Do you have any other symptoms? (e.g., leg pain, rash, fever, numbness)     Bruise on right foot above the ankle, denies pain there.  Protocols used: Toe Pain-A-AH

## 2023-06-03 NOTE — Progress Notes (Signed)
 [facility] [address] [place] [address] Dept: [phone] Dept Fax: [phone]  Office Visit  Subjective:    Patient ID: Alexis [name], female    DOB: 05-24-1934, 89 y.o..   MRN: [id number]  Chief Complaint  Patient presents with   Toe Pain    C/o having pain/redness in RT big toe x 2-3 days.    Medical Interpretation and history provided by her daughter at patient's request.  History of Present Illness:  Patient is in today for with a 3-day history of a red, swollen right great toe. Her daughter notes there had been a rim of yellow fluid under the skin surrounding the nail. She had been advised to have her mother soak her toe. This led to the toe draining, along with some bleeding along the nail margin. This has been tender for her mother.  Past Medical History: Patient Active Problem List   Diagnosis Date Noted   Acute kidney failure, unspecified (HCC) 04/16/2023   Headache 03/11/2023   Foot pain, left 01/17/2022   Rash 01/17/2022   Closed fracture of right hip with routine healing 07/09/2021   Closed fracture of right ankle 06/18/2021   Impacted ear wax 01/10/2021   Hearing loss 01/10/2021   Weakness 01/10/2021   Alzheimer disease (HCC) 06/26/2020   Hip pain 06/26/2020   S/P ORIF (open reduction internal fixation) fracture 06/07/2020   Anemia due to chronic kidney disease 06/03/2020   S/p left hip fracture 06/02/2020   Arthritis 05/27/2020   Hx of pulmonary embolus 05/27/2020   Closed fracture of right distal radius and ulna 05/23/2020   Abscess 05/10/2020   Diarrhea 01/13/2020   Neoplasm of uncertain behavior of skin 01/13/2020   Dysarthria 08/16/2019   Heel ulcer (HCC) 08/16/2019   PAD (peripheral artery disease) (HCC) 07/13/2019   Pressure ulcer of right heel, stage 3 (HCC) 07/13/2019   Right ventricular systolic dysfunction 07/13/2019   Elevated troponin 06/07/2019   Gram-negative  pneumonia (HCC) 06/07/2019   Multiple pulmonary emboli (HCC) 06/07/2019   Acute on chronic heart failure with preserved ejection fraction (HFpEF) (HCC) 06/06/2019   Severe mitral regurgitation 04/28/2019   VHD (valvular heart disease) 04/14/2019   Abnormal urinalysis 04/13/2019   Acute on chronic diastolic heart failure (HCC) 03/16/2019   Leg weakness 02/03/2019   S/P mitral valve repair 01/14/2019   Long-term (current) use of anticoagulants, INR goal 2.0-3.0 06/26/2017   Fever 12/03/2016   Abnormal brain MRI    Seizures (HCC)    Cerebrovascular disease    Adjustment disorder with mixed anxiety and depressed mood 07/09/2016   Medication reaction 07/08/2016   Streptococcal endocarditis 07/08/2016   Mitral valve replaced 07/08/2016   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(HCC)    Atrial fibrillation (HCC)    Shoulder blade pain 06/06/2016   Eczema 06/06/2016   Endocarditis 05/27/2016   Gastroenteritis 06/20/2014   Orthostatic hypotension 06/20/2014   Memory loss 08/16/2013   Encounter for monitoring Coumadin  therapy 03/30/2012   Essential hypertension 05/31/2008   Coronary atherosclerosis 05/31/2008   Congestive heart failure (HCC) 05/31/2008   Constipation 05/31/2008   Myalgia 05/31/2008   Other chest pain 05/31/2008   Chronic atrial fibrillation 05/31/2008   Past Surgical History:  Procedure Laterality Date   AORTIC VALVE REPLACEMENT  03/2008   CARDIAC VALVE REPLACEMENT     CATARACT EXTRACTION W/ INTRAOCULAR LENS IMPLANT Left    CORONARY ANGIOPLASTY  2009   CORONARY ARTERY BYPASS GRAFT  03/2008   MITRAL  VALVE REPAIR (MV)/CORONARY ARTERY BYPASS GRAFTING (CABG)  03/2008   2009 [facility]   TEE WITHOUT CARDIOVERSION N/A 06/26/2016   Procedure: TRANSESOPHAGEAL ECHOCARDIOGRAM (TEE);  Surgeon: Maude JAYSON Emmer, MD;  Location: [facility] ENDOSCOPY;  Service: Cardiovascular;  Laterality: N/A;   Family History  Problem Relation Age of Onset   Stroke Maternal Grandmother    Lung cancer  Brother    CAD Neg Hx    Outpatient Medications Prior to Visit  Medication Sig Dispense Refill   acetaminophen  (TYLENOL ) 500 MG tablet Take 500 mg by mouth every 6 (six) hours as needed.     allopurinol  (ZYLOPRIM ) 100 MG tablet Take 0.5 tablets (50 mg total) by mouth daily. Annual appt is due must see provider for future refills 45 tablet 3   aspirin  EC 81 MG tablet Take 81 mg by mouth daily.     atorvastatin  (LIPITOR) 10 MG tablet Take 1 tablet (10 mg total) by mouth at bedtime. 90 tablet 3   B Complex CAPS Take 1 capsule by mouth every evening.     budesonide  (ENTOCORT EC ) 3 MG 24 hr capsule Take 3 capsules (9 mg total) by mouth daily. 90 capsule 1   butalbital -acetaminophen -caffeine  (FIORICET) 50-325-40 MG tablet Take 1-2 tablets by mouth every 6 (six) hours as needed for headache. 60 tablet 0   Cholecalciferol  (VITAMIN D3) 50 MCG (2000 UT) capsule Take 1 capsule (2,000 Units total) by mouth daily. 100 capsule 3   diphenoxylate -atropine  (LOMOTIL ) 2.5-0.025 MG tablet Take 1 tablet by mouth 4 (four) times daily as needed for diarrhea or loose stools. 20 tablet 1   escitalopram  (LEXAPRO ) 5 MG tablet Take 1 tablet (5 mg total) by mouth daily. Follow-up appt due w/labs must see provider for future refills 90 tablet 1   ketoconazole (NIZORAL) 2 % cream Apply 1 Application topically 2 (two) times daily.     loperamide (IMODIUM) 2 MG capsule Take 2 mg by mouth as needed.     melatonin 3 MG TABS tablet Take 3 mg by mouth at bedtime as needed.     metoprolol  succinate (TOPROL -XL) 50 MG 24 hr tablet Take 1 tablet (50 mg total) by mouth daily. Overdue for appt due must see provider for future refills 90 tablet 3   mineral oil-hydrophilic petrolatum (AQUAPHOR) ointment Apply topically as needed for dry skin. 420 g 3   mupirocin  ointment (BACTROBAN ) 2 % Apply 1 Application topically 2 (two) times daily.     potassium chloride  SA (KLOR-CON  M) 20 MEQ tablet Take 1 tablet (20 mEq total) by mouth daily. 90  tablet 1   traMADol  (ULTRAM ) 50 MG tablet Take 1-2 tablets (50-100 mg total) by mouth every 12 (twelve) hours as needed. 120 tablet 1   triamcinolone  cream (KENALOG ) 0.1 % Apply 1 Application topically 2 (two) times daily. 160 g 3   warfarin (COUMADIN ) 1 MG tablet TAKE 1 TABLET BY MOUTH DAILY EXCEPT TAKE 1 1/2 TABLETS ON MONDAY, WEDNESDAY AND FRIDAY OR AS DIRECTED BY ANTICOAGULATION CLINIC 135 tablet 1   No facility-administered medications prior to visit.   Allergies  Allergen Reactions   Hydralazine Other (See Comments)    headache headache    Bumex  [Bumetanide ]     ?diarrhea   Ceftriaxone  Other (See Comments)    Per MD progress note 2/12:  Infusion reaction.  Denies other associated symptoms such as diaphoresis, nausea, vomiting, dysuria, diarrhea, lower abdominal pain, received palpitations, swelling, difficulty breathing   Eliquis [Apixaban]     Bad diarrhea  Hydralazine Hcl Other (See Comments)    Headache    Oxycodone      confusion     Objective:   Today's Vitals   06/03/23 1306  BP: 118/74  Pulse: 68  Temp: 97.7 F (36.5 C)  TempSrc: Temporal  SpO2: 100%  Weight: 173 lb 3.2 oz (78.6 kg)  Height: 4' 11 (1.499 m)   Body mass index is 34.98 kg/m.   General: Well developed, well nourished. No acute distress. Foot: There is a valgus deformity of the right great toe. There is no redness or swelling of the 1st MTP joint. There is   redness and swelling of the paronychium of the great toe with bloody drainage along the nail margin. There is no   clearly an ingrown nail, though the exam was difficult due to patient's pain.  Psych: Alert and oriented. Normal mood and affect.  There are no preventive care reminders to display for this patient.  Assessment & Plan:   Problem List Items Addressed This Visit   None Visit Diagnoses       Paronychia of great toe, right    -  Primary   I will have Ms. Calles continue twice daily soaks. I will prescribe cephalexin   for 7 days. Recommend she follow-up for nail care as needed.   Relevant Medications   cephALEXin  (KEFLEX ) 500 MG capsule       Return if symptoms worsen or fail to improve.   Garnette CHRISTELLA Simpler, MD

## 2023-06-03 NOTE — Patient Instructions (Signed)
 Soak toe for 15-20 minutes in warm water with Epsom salts twice daily.

## 2023-06-27 ENCOUNTER — Ambulatory Visit: Payer: Medicaid Other

## 2023-07-01 ENCOUNTER — Ambulatory Visit: Payer: Medicaid Other

## 2023-07-01 DIAGNOSIS — Z7901 Long term (current) use of anticoagulants: Secondary | ICD-10-CM

## 2023-07-01 LAB — POCT INR: INR: 1.8 — AB (ref 2.0–3.0)

## 2023-07-01 NOTE — Progress Notes (Addendum)
 Per pt's daughter, pt had a fall yesterday. Pt did not fall on the floor, she fell on top of her recliner. Pt denied hitting her head or any injuries. Daughter reports pt said she has been dizzy but she is not sure if the dizziness caused the fall or the fall caused the dizziness. Since fall pt has had a few occasions of nausea but no vomiting.  Also has had diarrhea occasionally for the last several days and is improving with imodium. Daughter reports pt is drinking plenty of water. She also reports pt has times when she just falls asleep in the chair very quickly, but this has been occurring for some time. Advised pt should be assessed for these symptoms. She would like to make an apt with PCP, since he is most familiar with pt's hx. Advised this nurse will talk to front office concerning an available apt. She said to make the apt and she will see it on mychart.   Medical screening examination/treatment/procedure(s) were performed by non-physician practitioner and as supervising physician I was immediately available for consultation/collaboration.  I agree with above. Karlynn Noel, MD

## 2023-07-01 NOTE — Patient Instructions (Addendum)
 Pre visit review using our clinic review tool, if applicable. No additional management support is needed unless otherwise documented below in the visit note.  Increase dose today to take 1 1/2 tablets and then continue 1 tablets daily except take 1 1/2 tablets on Mondays, Wednesdays and Fridays. Recheck INR in 2 week.

## 2023-07-04 ENCOUNTER — Ambulatory Visit: Payer: Medicaid Other | Admitting: Internal Medicine

## 2023-07-04 ENCOUNTER — Encounter: Payer: Self-pay | Admitting: Internal Medicine

## 2023-07-04 VITALS — BP 138/79 | HR 76 | Temp 98.4°F | Ht 59.0 in | Wt 172.0 lb

## 2023-07-04 DIAGNOSIS — R197 Diarrhea, unspecified: Secondary | ICD-10-CM

## 2023-07-04 DIAGNOSIS — Z7901 Long term (current) use of anticoagulants: Secondary | ICD-10-CM | POA: Diagnosis not present

## 2023-07-04 DIAGNOSIS — R29898 Other symptoms and signs involving the musculoskeletal system: Secondary | ICD-10-CM | POA: Diagnosis not present

## 2023-07-04 DIAGNOSIS — R413 Other amnesia: Secondary | ICD-10-CM

## 2023-07-04 DIAGNOSIS — R296 Repeated falls: Secondary | ICD-10-CM

## 2023-07-04 DIAGNOSIS — I679 Cerebrovascular disease, unspecified: Secondary | ICD-10-CM

## 2023-07-04 MED ORDER — DIPHENOXYLATE-ATROPINE 2.5-0.025 MG PO TABS: 1.0000 | ORAL_TABLET | Freq: Four times a day (QID) | ORAL | 1 refills | Status: AC | PRN

## 2023-07-04 MED ORDER — BUDESONIDE 3 MG PO CPEP: 9.0000 mg | ORAL_CAPSULE | Freq: Every day | ORAL | 5 refills | Status: DC

## 2023-07-04 NOTE — Progress Notes (Signed)
 Subjective:  Patient ID: Alexis [name], female    DOB: [birth year]  Age: 89 y.o. MRN: [id number]  CC: Medical Management of Chronic Issues   HPI Alexis [name] presents for a fall, diarrhea  Per pt's daughter, pt had a fall on 2/1. Pt did not fall on the floor, she fell on top of her recliner. Pt denied hitting her head or any injuries. Daughter reports pt said she has been dizzy but she is not sure if the dizziness caused the fall or the fall caused the dizziness. Since fall pt has had a few occasions of nausea but no vomiting.  Also has had diarrhea occasionally for the last several days and is improving with imodium. Daughter reports pt is drinking plenty of water. She also reports pt has times when she just falls asleep in the chair very quickly, but this has been occurring for some time.    Outpatient Medications Prior to Visit  Medication Sig Dispense Refill   acetaminophen  (TYLENOL ) 500 MG tablet Take 500 mg by mouth every 6 (six) hours as needed.     allopurinol  (ZYLOPRIM ) 100 MG tablet Take 0.5 tablets (50 mg total) by mouth daily. Annual appt is due must see provider for future refills 45 tablet 3   aspirin  EC 81 MG tablet Take 81 mg by mouth daily.     atorvastatin  (LIPITOR) 10 MG tablet Take 1 tablet (10 mg total) by mouth at bedtime. 90 tablet 3   B Complex CAPS Take 1 capsule by mouth every evening.     butalbital -acetaminophen -caffeine  (FIORICET) 50-325-40 MG tablet Take 1-2 tablets by mouth every 6 (six) hours as needed for headache. 60 tablet 0   Cholecalciferol  (VITAMIN D3) 50 MCG (2000 UT) capsule Take 1 capsule (2,000 Units total) by mouth daily. 100 capsule 3   escitalopram  (LEXAPRO ) 5 MG tablet Take 1 tablet (5 mg total) by mouth daily. Follow-up appt due w/labs must see provider for future refills 90 tablet 1   ketoconazole (NIZORAL) 2 % cream Apply 1 Application topically 2 (two) times daily.     loperamide (IMODIUM) 2 MG capsule Take 2 mg by mouth as  needed.     melatonin 3 MG TABS tablet Take 3 mg by mouth at bedtime as needed.     metoprolol  succinate (TOPROL -XL) 50 MG 24 hr tablet Take 1 tablet (50 mg total) by mouth daily. Overdue for appt due must see provider for future refills 90 tablet 3   mineral oil-hydrophilic petrolatum (AQUAPHOR) ointment Apply topically as needed for dry skin. 420 g 3   mupirocin  ointment (BACTROBAN ) 2 % Apply 1 Application topically 2 (two) times daily.     potassium chloride  SA (KLOR-CON  M) 20 MEQ tablet Take 1 tablet (20 mEq total) by mouth daily. 90 tablet 1   traMADol  (ULTRAM ) 50 MG tablet Take 1-2 tablets (50-100 mg total) by mouth every 12 (twelve) hours as needed. 120 tablet 1   triamcinolone  cream (KENALOG ) 0.1 % Apply 1 Application topically 2 (two) times daily. 160 g 3   warfarin (COUMADIN ) 1 MG tablet TAKE 1 TABLET BY MOUTH DAILY EXCEPT TAKE 1 1/2 TABLETS ON MONDAY, WEDNESDAY AND FRIDAY OR AS DIRECTED BY ANTICOAGULATION CLINIC 135 tablet 1   budesonide  (ENTOCORT EC ) 3 MG 24 hr capsule Take 3 capsules (9 mg total) by mouth daily. 90 capsule 1   diphenoxylate -atropine  (LOMOTIL ) 2.5-0.025 MG tablet Take 1 tablet by mouth 4 (four) times daily as needed for diarrhea or loose stools. 20 tablet  1   No facility-administered medications prior to visit.    ROS: Review of Systems  Constitutional:  Positive for fatigue. Negative for activity change, appetite change, chills and unexpected weight change.  HENT:  Negative for congestion, mouth sores and sinus pressure.   Eyes:  Negative for visual disturbance.  Respiratory:  Negative for cough and chest tightness.   Gastrointestinal:  Negative for abdominal pain and nausea.  Genitourinary:  Negative for difficulty urinating, frequency and vaginal pain.  Musculoskeletal:  Positive for arthralgias and gait problem. Negative for back pain and neck stiffness.  Skin:  Negative for color change, pallor and rash.  Neurological:  Positive for weakness. Negative for  dizziness, tremors, seizures, syncope, speech difficulty, light-headedness, numbness and headaches.  Hematological:  Bruises/bleeds easily.  Psychiatric/Behavioral:  Positive for confusion, decreased concentration and dysphoric mood. Negative for self-injury, sleep disturbance and suicidal ideas. The patient is not nervous/anxious.     Objective:  BP 138/79 (BP Location: Left Arm, Patient Position: Sitting, Cuff Size: Normal)   Pulse 76   Temp 98.4 F (36.9 C) (Oral)   Ht 4' 11 (1.499 m)   Wt 172 lb (78 kg)   SpO2 95%   BMI 34.74 kg/m   BP Readings from Last 3 Encounters:  07/04/23 138/79  06/03/23 118/74  05/06/23 116/70    Wt Readings from Last 3 Encounters:  07/04/23 172 lb (78 kg)  06/03/23 173 lb 3.2 oz (78.6 kg)  05/06/23 171 lb 9.6 oz (77.8 kg)    Physical Exam Constitutional:      General: She is not in acute distress.    Appearance: She is well-developed. She is obese.  HENT:     Head: Normocephalic.     Right Ear: External ear normal.     Left Ear: External ear normal.     Nose: Nose normal.  Eyes:     General:        Right eye: No discharge.        Left eye: No discharge.     Conjunctiva/sclera: Conjunctivae normal.     Pupils: Pupils are equal, round, and reactive to light.  Neck:     Thyroid : No thyromegaly.     Vascular: No JVD.     Trachea: No tracheal deviation.  Cardiovascular:     Rate and Rhythm: Normal rate and regular rhythm.     Heart sounds: Normal heart sounds.  Pulmonary:     Effort: No respiratory distress.     Breath sounds: No stridor. No wheezing.  Abdominal:     General: Bowel sounds are normal. There is no distension.     Palpations: Abdomen is soft. There is no mass.     Tenderness: There is no abdominal tenderness. There is no guarding or rebound.  Musculoskeletal:        General: No tenderness.     Cervical back: Normal range of motion and neck supple. No rigidity.     Right lower leg: No edema.     Left lower leg: No  edema.  Lymphadenopathy:     Cervical: No cervical adenopathy.  Skin:    Findings: No erythema or rash.  Neurological:     Mental Status: Mental status is at baseline.     Cranial Nerves: No cranial nerve deficit.     Motor: No abnormal muscle tone.     Coordination: Coordination normal.     Gait: Gait abnormal.     Deep Tendon Reflexes: Reflexes normal.  Psychiatric:  Behavior: Behavior normal.        Thought Content: Thought content normal.   In a w/c  Lab Results  Component Value Date   WBC 9.4 04/23/2023   HGB 12.2 04/23/2023   HCT 36.6 04/23/2023   PLT 253.0 04/23/2023   GLUCOSE 117 (H) 04/23/2023   CHOL 183 03/03/2023   TRIG 227.0 (H) 03/03/2023   HDL 52.10 03/03/2023   LDLDIRECT 90.0 01/10/2021   LDLCALC 86 03/03/2023   ALT 16 04/23/2023   AST 22 04/23/2023   NA 136 04/23/2023   K 3.8 04/23/2023   CL 109 04/23/2023   CREATININE 1.17 04/23/2023   BUN 13 04/23/2023   CO2 20 04/23/2023   TSH 3.07 03/03/2023   INR 1.8 (A) 07/01/2023   HGBA1C 6.0 03/03/2023    CT ANKLE RIGHT WO CONTRAST Result Date: 06/15/2021 CLINICAL DATA:  Evaluate right ankle fracture EXAM: CT OF THE RIGHT ANKLE WITHOUT CONTRAST TECHNIQUE: Multidetector CT imaging of the right ankle was performed according to the standard protocol. Multiplanar CT image reconstructions were also generated. RADIATION DOSE REDUCTION: This exam was performed according to the departmental dose-optimization program which includes automated exposure control, adjustment of the mA and/or kV according to patient size and/or use of iterative reconstruction technique. COMPARISON:  X-ray 06/11/2021 FINDINGS: Bones/Joint/Cartilage Acute comminuted nondisplaced fracture of the medial malleolus with horizontal and vertical components. Obliquely oriented nondisplaced fracture through the lateral malleolus. No posterior malleolar fracture component. Ankle mortise is congruent without widening or dislocation. Talus and calcaneus  are intact. Subtalar joints are aligned. No acute fracture or malalignment within the midfoot. Moderate degenerative changes are most pronounced at the second through fourth tarsometatarsal joints. Bones are demineralized. Ligaments Suboptimally assessed by CT. Muscles and Tendons No acute musculotendinous abnormality by CT. Soft tissues Soft tissue swelling and edema. Small hematoma overlies the lateral malleolar fracture site measuring approximately 3.6 x 0.9 cm. Atherosclerotic vascular calcifications are present. IMPRESSION: 1. Acute comminuted nondisplaced fractures of the medial and lateral malleoli, as described above. 2. Small hematoma overlies the lateral malleolar fracture site. Electronically Signed   By: Mabel Converse D.O.   On: 06/15/2021 08:42    Assessment & Plan:   Problem List Items Addressed This Visit     Memory loss   No change in MS       Cerebrovascular disease - Primary   No change      Long-term (current) use of anticoagulants, INR goal 2.0-3.0   Patient is on Coumadin .  F/u w/Coumadin  clinic      Leg weakness   In PT Use a walker, w/c      Diarrhea   Relapsed off Entocort On fiber Resume Budesonide  9 mg/d po. Lomotil  prn F/u w/GI      Falls   In PT Use a walker, w/c         Meds ordered this encounter  Medications   diphenoxylate -atropine  (LOMOTIL ) 2.5-0.025 MG tablet    Sig: Take 1 tablet by mouth 4 (four) times daily as needed for diarrhea or loose stools.    Dispense:  60 tablet    Refill:  1   budesonide  (ENTOCORT EC ) 3 MG 24 hr capsule    Sig: Take 3 capsules (9 mg total) by mouth daily.    Dispense:  90 capsule    Refill:  5      Follow-up: Return in about 3 months (around 10/01/2023) for a follow-up visit - ok VOV.  Marolyn Noel, MD

## 2023-07-08 DIAGNOSIS — K529 Noninfective gastroenteritis and colitis, unspecified: Secondary | ICD-10-CM | POA: Diagnosis not present

## 2023-07-11 DIAGNOSIS — R296 Repeated falls: Secondary | ICD-10-CM | POA: Insufficient documentation

## 2023-07-11 NOTE — Assessment & Plan Note (Signed)
In PT Use a walker, w/c

## 2023-07-11 NOTE — Assessment & Plan Note (Signed)
Relapsed off Entocort On fiber Resume Budesonide 9 mg/d po. Lomotil prn F/u w/GI

## 2023-07-11 NOTE — Assessment & Plan Note (Signed)
Patient is on Coumadin.  F/u w/Coumadin clinic

## 2023-07-11 NOTE — Assessment & Plan Note (Signed)
No change

## 2023-07-11 NOTE — Assessment & Plan Note (Signed)
No change in MS

## 2023-07-14 ENCOUNTER — Telehealth: Payer: Self-pay

## 2023-07-14 NOTE — Telephone Encounter (Signed)
 Pt's daughter, [name], requested change in coumadin clinic apt for pt scheduled for tomorrow due to conflict with another apt. Advised pt has apt with PCP on 2/21 and could have coumadin clinic the same morning. She requested PCP apt that was a video visit be changed to in office and make coumadin clinic apt for 8:30.  All apts made and changed.

## 2023-07-15 ENCOUNTER — Ambulatory Visit: Payer: Medicaid Other

## 2023-07-18 ENCOUNTER — Encounter: Payer: Self-pay | Admitting: Internal Medicine

## 2023-07-18 ENCOUNTER — Ambulatory Visit: Payer: Medicaid Other | Admitting: Internal Medicine

## 2023-07-18 ENCOUNTER — Ambulatory Visit: Payer: Medicaid Other

## 2023-07-18 VITALS — BP 110/68 | HR 68 | Temp 97.8°F | Ht 59.0 in | Wt 172.0 lb

## 2023-07-18 DIAGNOSIS — G309 Alzheimer's disease, unspecified: Secondary | ICD-10-CM | POA: Diagnosis not present

## 2023-07-18 DIAGNOSIS — Z7901 Long term (current) use of anticoagulants: Secondary | ICD-10-CM

## 2023-07-18 DIAGNOSIS — R296 Repeated falls: Secondary | ICD-10-CM | POA: Diagnosis not present

## 2023-07-18 DIAGNOSIS — F0280 Dementia in other diseases classified elsewhere without behavioral disturbance: Secondary | ICD-10-CM | POA: Diagnosis not present

## 2023-07-18 DIAGNOSIS — R197 Diarrhea, unspecified: Secondary | ICD-10-CM | POA: Diagnosis not present

## 2023-07-18 LAB — POCT INR: INR: 3.1 — AB (ref 2.0–3.0)

## 2023-07-18 NOTE — Assessment & Plan Note (Signed)
No recent falls In a w/c

## 2023-07-18 NOTE — Assessment & Plan Note (Signed)
 Pt has not started Entocort yet, taking Colestid Start Entocort F/u w/GI

## 2023-07-18 NOTE — Assessment & Plan Note (Signed)
 On Coumadin

## 2023-07-18 NOTE — Patient Instructions (Addendum)
 Pre visit review using our clinic review tool, if applicable. No additional management support is needed unless otherwise documented below in the visit note.  Decrease dose today to take 1/2 tablet and then continue 1 tablets daily except take 1 1/2 tablets on Mondays, Wednesdays and Fridays. Recheck INR in 3 week.

## 2023-07-18 NOTE — Assessment & Plan Note (Signed)
 Worse [id number] form was filled out: unable to learn English, unable to memorize history, civics in any language

## 2023-07-18 NOTE — Progress Notes (Signed)
 Subjective:  Patient ID: Alexis [name], female    DOB: Jan 20, 1934  Age: 89 y.o. MRN: [id number]  CC: Referral ([facility] at [facility]   HPI Alexis [name] presents for memory loss/dementia, diarrhea ( has not started Entocort yet, taking Colestid), falls Stools 5-6/d  Here w/dtr   Outpatient Medications Prior to Visit  Medication Sig Dispense Refill   acetaminophen (TYLENOL) 500 MG tablet Take 500 mg by mouth every 6 (six) hours as needed.     allopurinol (ZYLOPRIM) 100 MG tablet Take 0.5 tablets (50 mg total) by mouth daily. Annual appt is due must see provider for future refills 45 tablet 3   aspirin EC 81 MG tablet Take 81 mg by mouth daily.     atorvastatin (LIPITOR) 10 MG tablet Take 1 tablet (10 mg total) by mouth at bedtime. 90 tablet 3   B Complex CAPS Take 1 capsule by mouth every evening.     budesonide (ENTOCORT EC) 3 MG 24 hr capsule Take 3 capsules (9 mg total) by mouth daily. 90 capsule 5   butalbital-acetaminophen-caffeine (FIORICET) 50-325-40 MG tablet Take 1-2 tablets by mouth every 6 (six) hours as needed for headache. 60 tablet 0   Cholecalciferol (VITAMIN D3) 50 MCG (2000 UT) capsule Take 1 capsule (2,000 Units total) by mouth daily. 100 capsule 3   colestipol (COLESTID) 1 g tablet Take 1 g by mouth daily.     diphenoxylate-atropine (LOMOTIL) 2.5-0.025 MG tablet Take 1 tablet by mouth 4 (four) times daily as needed for diarrhea or loose stools. 60 tablet 1   escitalopram (LEXAPRO) 5 MG tablet Take 1 tablet (5 mg total) by mouth daily. Follow-up appt due w/labs must see provider for future refills 90 tablet 1   ketoconazole (NIZORAL) 2 % cream Apply 1 Application topically 2 (two) times daily.     loperamide (IMODIUM) 2 MG capsule Take 2 mg by mouth as needed.     melatonin 3 MG TABS tablet Take 3 mg by mouth at bedtime as needed.     metoprolol succinate (TOPROL-XL) 50 MG 24 hr tablet Take 1 tablet (50 mg total) by  mouth daily. Overdue for appt due must see provider for future refills 90 tablet 3   mineral oil-hydrophilic petrolatum (AQUAPHOR) ointment Apply topically as needed for dry skin. 420 g 3   mupirocin ointment (BACTROBAN) 2 % Apply 1 Application topically 2 (two) times daily.     potassium chloride SA (KLOR-CON M) 20 MEQ tablet Take 1 tablet (20 mEq total) by mouth daily. 90 tablet 1   traMADol (ULTRAM) 50 MG tablet Take 1-2 tablets (50-100 mg total) by mouth every 12 (twelve) hours as needed. 120 tablet 1   triamcinolone cream (KENALOG) 0.1 % Apply 1 Application topically 2 (two) times daily. 160 g 3   warfarin (COUMADIN) 1 MG tablet TAKE 1 TABLET BY MOUTH DAILY EXCEPT TAKE 1 1/2 TABLETS ON MONDAY, WEDNESDAY AND FRIDAY OR AS DIRECTED BY ANTICOAGULATION CLINIC 135 tablet 1   No facility-administered medications prior to visit.    ROS: Review of Systems  Constitutional:  Positive for fatigue. Negative for activity change, appetite change, chills and unexpected weight change.  HENT:  Negative for congestion, mouth sores and sinus pressure.   Eyes:  Negative for visual disturbance.  Respiratory:  Negative for cough and chest tightness.   Cardiovascular:  Negative for leg swelling.  Gastrointestinal:  Positive for diarrhea. Negative for abdominal pain, nausea and vomiting.  Genitourinary:  Positive  for urgency. Negative for difficulty urinating, frequency and vaginal pain.  Musculoskeletal:  Positive for back pain and gait problem.  Skin:  Negative for pallor and rash.  Neurological:  Positive for dizziness, weakness and light-headedness. Negative for tremors, numbness and headaches.  Hematological:  Bruises/bleeds easily.  Psychiatric/Behavioral:  Positive for confusion, decreased concentration and dysphoric mood. Negative for agitation, sleep disturbance and suicidal ideas. The patient is nervous/anxious.     Objective:  BP 110/68   Pulse 68   Temp 97.8 F (36.6 C) (Oral)   Ht 4\' 11"   (1.499 m)   Wt 172 lb (78 kg)   SpO2 96%   BMI 34.74 kg/m   BP Readings from Last 3 Encounters:  07/18/23 110/68  07/04/23 138/79  06/03/23 118/74    Wt Readings from Last 3 Encounters:  07/18/23 172 lb (78 kg)  07/04/23 172 lb (78 kg)  06/03/23 173 lb 3.2 oz (78.6 kg)    Physical Exam Constitutional:      General: She is not in acute distress.    Appearance: She is well-developed. She is obese.  HENT:     Head: Normocephalic.     Right Ear: External ear normal.     Left Ear: External ear normal.     Nose: Nose normal.  Eyes:     General:        Right eye: No discharge.        Left eye: No discharge.     Conjunctiva/sclera: Conjunctivae normal.     Pupils: Pupils are equal, round, and reactive to light.  Neck:     Thyroid: No thyromegaly.     Vascular: No JVD.     Trachea: No tracheal deviation.  Cardiovascular:     Rate and Rhythm: Normal rate and regular rhythm.     Heart sounds: Normal heart sounds.  Pulmonary:     Effort: No respiratory distress.     Breath sounds: No stridor. No wheezing.  Abdominal:     General: Bowel sounds are normal. There is no distension.     Palpations: Abdomen is soft. There is no mass.     Tenderness: There is no abdominal tenderness. There is no guarding or rebound.  Musculoskeletal:        General: No tenderness.     Cervical back: Normal range of motion and neck supple. No rigidity.     Right lower leg: No edema.     Left lower leg: No edema.  Lymphadenopathy:     Cervical: No cervical adenopathy.  Skin:    Findings: No erythema or rash.  Neurological:     Mental Status: She is disoriented.     Cranial Nerves: No cranial nerve deficit.     Motor: Weakness present. No abnormal muscle tone.     Coordination: Coordination abnormal.     Gait: Gait abnormal.     Deep Tendon Reflexes: Reflexes normal.  Psychiatric:        Mood and Affect: Mood normal.   Alert, disoriented, confused In a w/c    A total time of 45 minutes  was spent preparing to see the patient, reviewing tests, x-rays, and other medical records.  Also, obtaining history and performing comprehensive physical exam.  Additionally, counseling the patient regarding the above listed issues - diarrhea, dementia.   Finally, documenting clinical information in the health records, coordination of care, [phone] form was filled out.   Lab Results  Component Value Date   WBC 9.4 04/23/2023  HGB 12.2 04/23/2023   HCT 36.6 04/23/2023   PLT 253.0 04/23/2023   GLUCOSE 117 (H) 04/23/2023   CHOL 183 03/03/2023   TRIG 227.0 (H) 03/03/2023   HDL 52.10 03/03/2023   LDLDIRECT 90.0 01/10/2021   LDLCALC 86 03/03/2023   ALT 16 04/23/2023   AST 22 04/23/2023   NA 136 04/23/2023   K 3.8 04/23/2023   CL 109 04/23/2023   CREATININE 1.17 04/23/2023   BUN 13 04/23/2023   CO2 20 04/23/2023   TSH 3.07 03/03/2023   INR 3.1 (A) 07/18/2023   HGBA1C 6.0 03/03/2023    CT ANKLE RIGHT WO CONTRAST Result Date: 06/15/2021 CLINICAL DATA:  Evaluate right ankle fracture EXAM: CT OF THE RIGHT ANKLE WITHOUT CONTRAST TECHNIQUE: Multidetector CT imaging of the right ankle was performed according to the standard protocol. Multiplanar CT image reconstructions were also generated. RADIATION DOSE REDUCTION: This exam was performed according to the departmental dose-optimization program which includes automated exposure control, adjustment of the mA and/or kV according to patient size and/or use of iterative reconstruction technique. COMPARISON:  X-ray 06/11/2021 FINDINGS: Bones/Joint/Cartilage Acute comminuted nondisplaced fracture of the medial malleolus with horizontal and vertical components. Obliquely oriented nondisplaced fracture through the lateral malleolus. No posterior malleolar fracture component. Ankle mortise is congruent without widening or dislocation. Talus and calcaneus are intact. Subtalar joints are aligned. No acute fracture or malalignment within the midfoot. Moderate  degenerative changes are most pronounced at the second through fourth tarsometatarsal joints. Bones are demineralized. Ligaments Suboptimally assessed by CT. Muscles and Tendons No acute musculotendinous abnormality by CT. Soft tissues Soft tissue swelling and edema. Small hematoma overlies the lateral malleolar fracture site measuring approximately 3.6 x 0.9 cm. Atherosclerotic vascular calcifications are present. IMPRESSION: 1. Acute comminuted nondisplaced fractures of the medial and lateral malleoli, as described above. 2. Small hematoma overlies the lateral malleolar fracture site. Electronically Signed   By: Duanne Guess D.O.   On: 06/15/2021 08:42    Assessment & Plan:   Problem List Items Addressed This Visit     Long term current use of anticoagulant therapy   On Coumadin      Diarrhea - Primary   Pt has not started Entocort yet, taking Colestid Start Entocort F/u w/GI      Alzheimer disease (HCC)   Worse [id number] form was filled out: unable to learn English, unable to memorize history, civics in any language      Falls   No recent falls In a w/c         No orders of the defined types were placed in this encounter.     Follow-up: Return in about 3 months (around 10/15/2023) for a follow-up visit.  Sonda Primes, MD

## 2023-07-18 NOTE — Progress Notes (Cosign Needed Addendum)
 Daughter reports pt has had recent diarrhea. Pt has apt with PCP today also. Daughter was also c/o elevated BP last night and requested an apt today if possible. Scheduled pt with Moshe Cipro, NP.  Decrease dose today to take 1/2 tablet and then continue 1 tablets daily except take 1 1/2 tablets on Mondays, Wednesdays and Fridays. Recheck INR in 3 week.   Medical screening examination/treatment/procedure(s) were performed by non-physician practitioner and as supervising physician I was immediately available for consultation/collaboration.  I agree with above. Jacinta Shoe, MD

## 2023-08-05 ENCOUNTER — Ambulatory Visit: Payer: Medicaid Other

## 2023-08-05 ENCOUNTER — Telehealth: Payer: Self-pay | Admitting: Internal Medicine

## 2023-08-05 NOTE — Telephone Encounter (Unsigned)
 Copied from CRM [id number]. Topic: Appointments - Scheduling Inquiry for Clinic >> Aug 05, 2023  3:17 PM Fredrich Romans wrote: Reason for CRM: Patient missed INR today with shannon due to having the flu.She would ike a phone call to get rescheduled.

## 2023-08-05 NOTE — Telephone Encounter (Signed)
 LVM for pt's daughter

## 2023-08-06 ENCOUNTER — Telehealth: Payer: Self-pay | Admitting: Internal Medicine

## 2023-08-06 NOTE — Telephone Encounter (Signed)
 Copied from CRM [id number]. Topic: Referral - Status >> Aug 06, 2023 11:41 AM Ernst Spell wrote: Reason for CRM: pt's daughter called and stated that when the patient came in for an appointment with Dr. Posey Rea, he agreed to send in an referral for neurology at [facility]. However, I didn't see the referral in the patient's chart. Please call and advise.

## 2023-08-06 NOTE — Telephone Encounter (Signed)
 Pt's daughter called to [facility] coumadin clinic apt.

## 2023-08-07 NOTE — Telephone Encounter (Signed)
 The referral was made on 04/09/2023:  "Pls ref to [facility] Pls call dtr Peter Minium Thx"

## 2023-08-08 ENCOUNTER — Ambulatory Visit

## 2023-08-08 ENCOUNTER — Telehealth: Payer: Self-pay

## 2023-08-08 DIAGNOSIS — Z7901 Long term (current) use of anticoagulants: Secondary | ICD-10-CM

## 2023-08-08 LAB — POCT INR: INR: 2.4 (ref 2.0–3.0)

## 2023-08-08 NOTE — Telephone Encounter (Signed)
 Pt and her daughter in for coumadin clinic apt today. Pt's daughter inquired about budesonide dosing. She reports the prescription says to take 3 daily. But when the pt was prescribed it in the past by GI the script was tapered down. She reports it was 3 tablets daily x 4 week, then 2 tablets daily x 2 weeks, then 1 tablet daily x 2 weeks and then stop the medication.  Pt's daughter is wondering if pt should continue 3 tablets and if so how long. Advised a msg would be sent to PCP for clarification. Pt's daughter, [name], was appreciative.

## 2023-08-08 NOTE — Progress Notes (Signed)
 Pt's daughter reports her and the pt had the flu last week. Pt was prescribed budesomide on 2/21. This can increase or decrease INR. It appears it is not having any interaction with warfarin at this time. Pt's diarrhea has also stopped. Continue 1 tablets daily except take 1 1/2 tablets on Mondays, Wednesdays and Fridays. Recheck INR in 4 week.   Sent msg to PCP concerning budesonide prescription the pt's daughter had a question about.

## 2023-08-08 NOTE — Patient Instructions (Addendum)
 Pre visit review using our clinic review tool, if applicable. No additional management support is needed unless otherwise documented below in the visit note.  Continue 1 tablets daily except take 1 1/2 tablets on Mondays, Wednesdays and Fridays. Recheck INR in 4 week.

## 2023-08-12 NOTE — Telephone Encounter (Signed)
 The daughter can try to reduce budesonide to 2 capsules a day, assuming it will control her mother's diarrhea.  Keep follow-up appointment with me.  Thank you

## 2023-08-13 NOTE — Telephone Encounter (Signed)
 Contacted pt's daughter, [name], and advised of PCP msg. Currently there is not a f/u apt scheduled. Advised need for f/u apt with PCP. Offered to schedule while on phone. [name] said she will schedule it at a later time. She needs to look at pt's other apts. Advised if any changes to contact PCP office. [name] verbalized understanding.

## 2023-08-28 ENCOUNTER — Telehealth: Payer: Self-pay | Admitting: Internal Medicine

## 2023-08-28 NOTE — Telephone Encounter (Signed)
 Copied from CRM [id number]. Topic: Clinical - Medication Refill >> Aug 28, 2023  2:18 PM Sonny Dandy B wrote: Most Recent Primary Care Visit:  Provider: Sherrie George  Department: [facility]  Visit Type: [facility]MADIN CLINIC  Date: 08/08/2023  Medication: escitalopram (LEXAPRO) 5 MG tablet  Has the patient contacted their pharmacy? Yes (Agent: If no, request that the patient contact the pharmacy for the refill. If patient does not wish to contact the pharmacy document the reason why and proceed with request.) (Agent: If yes, when and what did the pharmacy advise?)  Is this the correct pharmacy for this prescription? Yes If no, delete pharmacy and type the correct one.  This is the patient's preferred pharmacy:  [facility] [address] [place] - [address] [place] [address] Phone: [phone] Fax: [phone]   Has the prescription been filled recently? Yes  Is the patient out of the medication? Yes  Has the patient been seen for an appointment in the last year OR does the patient have an upcoming appointment? Yes  Can we respond through MyChart? Yes  Agent: Please be advised that Rx refills may take up to 3 business days. We ask that you follow-up with your pharmacy.

## 2023-08-28 NOTE — Telephone Encounter (Signed)
 Existing refill.

## 2023-09-03 NOTE — Telephone Encounter (Signed)
 Copied from CRM [id number]. Topic: Clinical - Medication Question >> Sep 03, 2023 10:28 AM Cammy Copa D wrote: Reason for CRM: escitalopram (LEXAPRO) 5 MG tablet Pt's daughter called for status of rx refill currently in progress from 08/28/23

## 2023-09-04 ENCOUNTER — Other Ambulatory Visit: Payer: Self-pay | Admitting: Family

## 2023-09-04 MED ORDER — ESCITALOPRAM OXALATE 5 MG PO TABS: 5.0000 mg | ORAL_TABLET | Freq: Every day | ORAL | 1 refills | Status: AC

## 2023-09-05 ENCOUNTER — Ambulatory Visit

## 2023-09-05 DIAGNOSIS — Z7901 Long term (current) use of anticoagulants: Secondary | ICD-10-CM | POA: Diagnosis not present

## 2023-09-05 LAB — POCT INR: INR: 2.5 (ref 2.0–3.0)

## 2023-09-05 NOTE — Progress Notes (Signed)
 Continue 1 tablets daily except take 1 1/2 tablets on Mondays, Wednesdays and Fridays. Recheck INR in 4 week.

## 2023-09-05 NOTE — Patient Instructions (Addendum)
 Pre visit review using our clinic review tool, if applicable. No additional management support is needed unless otherwise documented below in the visit note.  Continue 1 tablets daily except take 1 1/2 tablets on Mondays, Wednesdays and Fridays. Recheck INR in 4 week.

## 2023-09-30 ENCOUNTER — Other Ambulatory Visit: Payer: Self-pay | Admitting: Internal Medicine

## 2023-09-30 ENCOUNTER — Ambulatory Visit

## 2023-09-30 DIAGNOSIS — Z7901 Long term (current) use of anticoagulants: Secondary | ICD-10-CM | POA: Diagnosis not present

## 2023-09-30 DIAGNOSIS — Z5181 Encounter for therapeutic drug level monitoring: Secondary | ICD-10-CM

## 2023-09-30 LAB — POCT INR: INR: 3.5 — AB (ref 2.0–3.0)

## 2023-09-30 NOTE — Progress Notes (Addendum)
 Hold dose today and then change weekly dose to take 1 tablets daily except take 1 1/2 tablets on Mondays and Fridays. Recheck INR in 2 week at appointment with Dr. Georgia Kipper. Pt and daughter requested to have INR lab at PCP apt on 5/21. Advised this nurse will f/u with dosing instructions when result is received.  Sent msg to PCP and made note on schedule.   Medical screening examination/treatment/procedure(s) were performed by non-physician practitioner and as supervising physician I was immediately available for consultation/collaboration.  I agree with above. Adelaide Holy, MD

## 2023-09-30 NOTE — Patient Instructions (Addendum)
 Pre visit review using our clinic review tool, if applicable. No additional management support is needed unless otherwise documented below in the visit note.  Hold dose today and then change weekly dose to take 1 tablets daily except take 1 1/2 tablets on Mondays and Fridays. Recheck INR in 2 week at appointment with Dr. Georgia Kipper.

## 2023-10-02 ENCOUNTER — Telehealth: Payer: Self-pay | Admitting: Internal Medicine

## 2023-10-02 NOTE — Telephone Encounter (Signed)
 Copied from CRM [id number]. Topic: Referral - Status >> Aug 06, 2023 11:41 AM Elita Guitar wrote: Reason for CRM: pt's daughter called and stated that when the patient came in for an appointment with Dr. Georgia Kipper, he agreed to send in an referral for neurology at [facility]. However, I didn't see the referral in the patient's chart. Please call and advise. >> Oct 02, 2023 11:23 AM Zipporah Him wrote: Patient calling in regard to this referral. She states it was never sent over. I do not see one in her chart. She states it should be for [facility] ph: [phone] f: [phone]. She said they are going to call her to schedule as soon as they receive the referral from the office. Please call patient if there are any issues with this request.

## 2023-10-03 NOTE — Telephone Encounter (Signed)
 Neurology referral was sent in November 2024.  Please see the referral.  Thanks

## 2023-10-03 NOTE — Telephone Encounter (Signed)
 Refraal has been sent into the clinic below on 04/09/2023 and is still active... attempted to reach pts daught to give her following information to try and reach out to the clinic and possibly get an apptmnt set up.  "Pls ref to [facility] call dtr Varya at  [facility] - [facility] [address] [place], [place] [address] [phone] fax"

## 2023-10-07 ENCOUNTER — Other Ambulatory Visit: Payer: Self-pay | Admitting: Internal Medicine

## 2023-10-07 ENCOUNTER — Ambulatory Visit: Payer: Self-pay

## 2023-10-07 NOTE — Telephone Encounter (Signed)
 Chief Complaint: lower back pain Symptoms: pain in lower back and both hips Frequency: 3-4 days Pertinent Negatives: Patient denies injury Disposition: [] ED /[] Urgent Care (no appt availability in office) / [x] Appointment(In office/virtual)/ []  [facility]/ [] Home Care/ [] Refused Recommended Disposition /[] [facility]/ []  Follow-up with PCP Additional Notes: Daughter states pt is complaining of back pain for 3-4 days and today she started complaining about pain in her hips. States pt said pain when trying to turn in her bed. States pain 5/10 and taking tramadol  and tylenol . States pt first noticed it when she was trying to turn in bed.   Copied from CRM [id number]. Topic: Clinical - Red Word Triage >> Oct 07, 2023  8:51 AM Howard Macho wrote: Red Word that prompted transfer to Nurse Triage: patient daughter called stating she is having lower back pain. Patient daughter states the patient is also having hip pain and maybe because she had surgery in the past Reason for Disposition  Caused by a twisting, bending, or lifting injury  Answer Assessment - Initial Assessment Questions 1. ONSET: "When did the pain begin?"      3-4 days 2. LOCATION: "Where does it hurt?" (upper, mid or lower back)     Lower back 3. SEVERITY: "How bad is the pain?"  (e.g., Scale 1-10; mild, moderate, or severe)   - MILD (1-3): Doesn't interfere with normal activities.    - MODERATE (4-7): Interferes with normal activities or awakens from sleep.    - SEVERE (8-10): Excruciating pain, unable to do any normal activities.      5/10 4. PATTERN: "Is the pain constant?" (e.g., yes, no; constant, intermittent)      intermittent 5. RADIATION: "Does the pain shoot into your legs or somewhere else?"     Both hips 6. CAUSE:  "What do you think is causing the back pain?"      unknown  8. MEDICINES: "What have you taken so far for the pain?" (e.g., nothing, acetaminophen , NSAIDS)     Tylenol  and tramadol  9.  NEUROLOGIC SYMPTOMS: "Do you have any weakness, numbness, or problems with bowel/bladder control?"     no 10. OTHER SYMPTOMS: "Do you have any other symptoms?" (e.g., fever, abdomen pain, burning with urination, blood in urine)       no  Protocols used: Back Pain-A-AH

## 2023-10-15 ENCOUNTER — Ambulatory Visit: Payer: Self-pay

## 2023-10-15 ENCOUNTER — Other Ambulatory Visit (INDEPENDENT_AMBULATORY_CARE_PROVIDER_SITE_OTHER)

## 2023-10-15 ENCOUNTER — Telehealth: Payer: Self-pay

## 2023-10-15 ENCOUNTER — Encounter: Payer: Self-pay | Admitting: Internal Medicine

## 2023-10-15 ENCOUNTER — Ambulatory Visit: Admitting: Internal Medicine

## 2023-10-15 ENCOUNTER — Other Ambulatory Visit (HOSPITAL_COMMUNITY): Payer: Self-pay

## 2023-10-15 VITALS — BP 128/74 | HR 74 | Temp 97.9°F | Ht 59.0 in

## 2023-10-15 DIAGNOSIS — I2510 Atherosclerotic heart disease of native coronary artery without angina pectoris: Secondary | ICD-10-CM | POA: Diagnosis not present

## 2023-10-15 DIAGNOSIS — R413 Other amnesia: Secondary | ICD-10-CM

## 2023-10-15 DIAGNOSIS — Z7901 Long term (current) use of anticoagulants: Secondary | ICD-10-CM

## 2023-10-15 DIAGNOSIS — Z5181 Encounter for therapeutic drug level monitoring: Secondary | ICD-10-CM | POA: Diagnosis not present

## 2023-10-15 DIAGNOSIS — R296 Repeated falls: Secondary | ICD-10-CM

## 2023-10-15 DIAGNOSIS — I10 Essential (primary) hypertension: Secondary | ICD-10-CM

## 2023-10-15 DIAGNOSIS — R197 Diarrhea, unspecified: Secondary | ICD-10-CM | POA: Diagnosis not present

## 2023-10-15 LAB — COMPREHENSIVE METABOLIC PANEL WITH GFR
ALT: 13 U/L (ref 0–35)
AST: 19 U/L (ref 0–37)
Albumin: 3.9 g/dL (ref 3.5–5.2)
Alkaline Phosphatase: 105 U/L (ref 39–117)
BUN: 22 mg/dL (ref 6–23)
CO2: 28 meq/L (ref 19–32)
Calcium: 9.2 mg/dL (ref 8.4–10.5)
Chloride: 103 meq/L (ref 96–112)
Creatinine, Ser: 1.27 mg/dL — ABNORMAL HIGH (ref 0.40–1.20)
GFR: 38.03 mL/min — ABNORMAL LOW (ref >60.00)
Glucose, Bld: 100 mg/dL — ABNORMAL HIGH (ref 70–99)
Potassium: 3.7 meq/L (ref 3.5–5.1)
Sodium: 140 meq/L (ref 135–145)
Total Bilirubin: 0.4 mg/dL (ref 0.2–1.2)
Total Protein: 7.1 g/dL (ref 6.0–8.3)

## 2023-10-15 LAB — CBC WITH DIFFERENTIAL/PLATELET
Basophils Absolute: 0 10*3/uL (ref 0.0–0.1)
Basophils Relative: 0.3 % (ref 0.0–3.0)
Eosinophils Absolute: 0.2 10*3/uL (ref 0.0–0.7)
Eosinophils Relative: 2.5 % (ref 0.0–5.0)
HCT: 38.2 % (ref 36.0–46.0)
Hemoglobin: 12.8 g/dL (ref 12.0–15.0)
Lymphocytes Relative: 16.9 % (ref 12.0–46.0)
Lymphs Abs: 1.6 10*3/uL (ref 0.7–4.0)
MCHC: 33.6 g/dL (ref 30.0–36.0)
MCV: 89.4 fl (ref 78.0–100.0)
Monocytes Absolute: 0.8 10*3/uL (ref 0.1–1.0)
Monocytes Relative: 8.6 % (ref 3.0–12.0)
Neutro Abs: 6.7 10*3/uL (ref 1.4–7.7)
Neutrophils Relative %: 71.7 % (ref 43.0–77.0)
Platelets: 292 10*3/uL (ref 150.0–400.0)
RBC: 4.28 Mil/uL (ref 3.87–5.11)
RDW: 14.9 % (ref 11.5–15.5)
WBC: 9.4 10*3/uL (ref 4.0–10.5)

## 2023-10-15 LAB — TSH: TSH: 3.88 u[IU]/mL (ref 0.35–5.50)

## 2023-10-15 LAB — PROTIME-INR
INR: 3.6 ratio — ABNORMAL HIGH (ref 0.8–1.0)
Prothrombin Time: 35.8 s — ABNORMAL HIGH (ref 9.6–13.1)

## 2023-10-15 MED ORDER — ERYTHROMYCIN 5 MG/GM OP OINT: 1.0000 | TOPICAL_OINTMENT | Freq: Three times a day (TID) | OPHTHALMIC | 0 refills | Status: AC

## 2023-10-15 MED ORDER — TRAMADOL HCL 50 MG PO TABS
50.0000 mg | ORAL_TABLET | Freq: Two times a day (BID) | ORAL | 1 refills | Status: AC | PRN
Start: 2023-10-15 — End: ?

## 2023-10-15 MED ORDER — DOXYCYCLINE HYCLATE 100 MG PO TABS: 100.0000 mg | ORAL_TABLET | Freq: Two times a day (BID) | ORAL | 1 refills | Status: DC

## 2023-10-15 NOTE — Telephone Encounter (Signed)
 Pharmacy Patient Advocate Encounter  Received notification from [facility] that Prior Authorization for traMADol  HCl 50MG  tablets has been APPROVED from 10/15/2023 to 04/12/2024.   PA #/Case ID/Reference #: [id number]

## 2023-10-15 NOTE — Patient Instructions (Addendum)
 Pre visit review using our clinic review tool, if applicable. No additional management support is needed unless otherwise documented below in the visit note.  Hold dose today and then change weekly dose to take 1 tablets daily. Recheck INR in 2 weeks.

## 2023-10-15 NOTE — Progress Notes (Unsigned)
 Subjective:  Patient ID: Alexis [name], female    DOB: [birth year]  Age: 89 y.o. MRN: [id number]  CC: Medical Management of Chronic Issues (Follow up, (has had lab already for INR). Patient still having ongoing back pain, treating with tylenol  and 2 tramadol . Daughter believes this is related to pulled muscle. Also having lower left back/flank)   HPI Alexis [name] presents for LBP C/o L eye pain No falls Here w/her dtr  Outpatient Medications Prior to Visit  Medication Sig Dispense Refill   acetaminophen  (TYLENOL ) 500 MG tablet Take 500 mg by mouth every 6 (six) hours as needed.     allopurinol  (ZYLOPRIM ) 100 MG tablet Take 0.5 tablets (50 mg total) by mouth daily. Annual appt is due must see provider for future refills 45 tablet 3   aspirin  EC 81 MG tablet Take 81 mg by mouth daily.     atorvastatin  (LIPITOR) 10 MG tablet Take 1 tablet (10 mg total) by mouth at bedtime. 90 tablet 3   B Complex CAPS Take 1 capsule by mouth every evening.     budesonide  (ENTOCORT EC ) 3 MG 24 hr capsule Take 3 capsules (9 mg total) by mouth daily. 90 capsule 5   butalbital -acetaminophen -caffeine  (FIORICET) 50-325-40 MG tablet Take 1-2 tablets by mouth every 6 (six) hours as needed for headache. 60 tablet 0   Cholecalciferol  (VITAMIN D3) 50 MCG (2000 UT) capsule Take 1 capsule (2,000 Units total) by mouth daily. 100 capsule 3   colestipol (COLESTID) 1 g tablet Take 1 g by mouth daily.     diphenoxylate -atropine  (LOMOTIL ) 2.5-0.025 MG tablet Take 1 tablet by mouth 4 (four) times daily as needed for diarrhea or loose stools. 60 tablet 1   escitalopram  (LEXAPRO ) 5 MG tablet Take 1 tablet (5 mg total) by mouth daily. 90 tablet 1   ketoconazole (NIZORAL) 2 % cream Apply 1 Application topically 2 (two) times daily.     loperamide (IMODIUM) 2 MG capsule Take 2 mg by mouth as needed.     melatonin 3 MG TABS tablet Take 3 mg by mouth at bedtime as needed.     metoprolol  succinate (TOPROL -XL) 50 MG 24 hr  tablet Take 1 tablet (50 mg total) by mouth daily. Overdue for appt due must see provider for future refills 90 tablet 3   mineral oil-hydrophilic petrolatum (AQUAPHOR) ointment Apply topically as needed for dry skin. 420 g 3   mupirocin  ointment (BACTROBAN ) 2 % Apply 1 Application topically 2 (two) times daily.     potassium chloride  SA (KLOR-CON  M) 20 MEQ tablet Take 1 tablet (20 mEq total) by mouth daily. 90 tablet 1   triamcinolone  cream (KENALOG ) 0.1 % Apply 1 Application topically 2 (two) times daily. 160 g 3   warfarin (COUMADIN ) 1 MG tablet TAKE 1 TABLET BY MOUTH DAILY EXCEPT TAKE 1 1/2 TABLETS ON MONDAY, WEDNESDAY AND FRIDAY OR AS DIRECTED BY ANTICOAGULATION CLINIC 135 tablet 1   traMADol  (ULTRAM ) 50 MG tablet Take 1-2 tablets (50-100 mg total) by mouth every 12 (twelve) hours as needed. 120 tablet 1   No facility-administered medications prior to visit.    ROS: Review of Systems  Constitutional:  Positive for fatigue. Negative for activity change, appetite change, chills and unexpected weight change.  HENT:  Negative for congestion, mouth sores and sinus pressure.   Eyes:  Positive for pain. Negative for redness and visual disturbance.  Respiratory:  Negative for cough and chest tightness.   Gastrointestinal:  Negative for abdominal pain and  nausea.  Genitourinary:  Positive for frequency. Negative for difficulty urinating and vaginal pain.  Musculoskeletal:  Positive for arthralgias, back pain and gait problem.  Skin:  Negative for pallor and rash.  Neurological:  Negative for dizziness, tremors, weakness, numbness and headaches.  Hematological:  Bruises/bleeds easily.  Psychiatric/Behavioral:  Positive for confusion and decreased concentration. Negative for behavioral problems and sleep disturbance.     Objective:  BP 128/74   Pulse 74   Temp 97.9 F (36.6 C) (Oral)   Ht 4\' 11"  (1.499 m)   SpO2 95%   BMI 34.74 kg/m   BP Readings from Last 3 Encounters:  10/15/23  128/74  07/18/23 110/68  07/04/23 138/79    Wt Readings from Last 3 Encounters:  07/18/23 172 lb (78 kg)  07/04/23 172 lb (78 kg)  06/03/23 173 lb 3.2 oz (78.6 kg)    Physical Exam Constitutional:      General: She is not in acute distress.    Appearance: She is well-developed. She is obese.  HENT:     Head: Normocephalic.     Right Ear: External ear normal.     Left Ear: External ear normal.     Nose: Nose normal.  Eyes:     General:        Right eye: No discharge.        Left eye: No discharge.     Conjunctiva/sclera: Conjunctivae normal.     Pupils: Pupils are equal, round, and reactive to light.  Neck:     Thyroid : No thyromegaly.     Vascular: No JVD.     Trachea: No tracheal deviation.  Cardiovascular:     Rate and Rhythm: Normal rate and regular rhythm.     Heart sounds: Normal heart sounds.  Pulmonary:     Effort: No respiratory distress.     Breath sounds: No stridor. No wheezing.  Abdominal:     General: Bowel sounds are normal. There is no distension.     Palpations: Abdomen is soft. There is no mass.     Tenderness: There is no abdominal tenderness. There is no guarding or rebound.  Musculoskeletal:        General: No tenderness.     Cervical back: Normal range of motion and neck supple. No rigidity.     Right lower leg: No edema.     Left lower leg: No edema.  Lymphadenopathy:     Cervical: No cervical adenopathy.  Skin:    Findings: No erythema or rash.  Neurological:     Mental Status: Mental status is at baseline.     Cranial Nerves: No cranial nerve deficit.     Motor: Weakness present. No abnormal muscle tone.     Coordination: Coordination abnormal.     Gait: Gait abnormal.     Deep Tendon Reflexes: Reflexes normal.  Psychiatric:        Behavior: Behavior normal.    L eye stye x 2 - lower and upper eyelid  Lab Results  Component Value Date   WBC 9.4 10/15/2023   HGB 12.8 10/15/2023   HCT 38.2 10/15/2023   PLT 292.0 10/15/2023    GLUCOSE 100 (H) 10/15/2023   CHOL 183 03/03/2023   TRIG 227.0 (H) 03/03/2023   HDL 52.10 03/03/2023   LDLDIRECT 90.0 01/10/2021   LDLCALC 86 03/03/2023   ALT 13 10/15/2023   AST 19 10/15/2023   NA 140 10/15/2023   K 3.7 10/15/2023   CL 103 10/15/2023  CREATININE 1.27 (H) 10/15/2023   BUN 22 10/15/2023   CO2 28 10/15/2023   TSH 3.88 10/15/2023   INR 3.6 (H) 10/15/2023   HGBA1C 6.0 03/03/2023    CT ANKLE RIGHT WO CONTRAST Result Date: 06/15/2021 CLINICAL DATA:  Evaluate right ankle fracture EXAM: CT OF THE RIGHT ANKLE WITHOUT CONTRAST TECHNIQUE: Multidetector CT imaging of the right ankle was performed according to the standard protocol. Multiplanar CT image reconstructions were also generated. RADIATION DOSE REDUCTION: This exam was performed according to the departmental dose-optimization program which includes automated exposure control, adjustment of the mA and/or kV according to patient size and/or use of iterative reconstruction technique. COMPARISON:  X-ray 06/11/2021 FINDINGS: Bones/Joint/Cartilage Acute comminuted nondisplaced fracture of the medial malleolus with horizontal and vertical components. Obliquely oriented nondisplaced fracture through the lateral malleolus. No posterior malleolar fracture component. Ankle mortise is congruent without widening or dislocation. Talus and calcaneus are intact. Subtalar joints are aligned. No acute fracture or malalignment within the midfoot. Moderate degenerative changes are most pronounced at the second through fourth tarsometatarsal joints. Bones are demineralized. Ligaments Suboptimally assessed by CT. Muscles and Tendons No acute musculotendinous abnormality by CT. Soft tissues Soft tissue swelling and edema. Small hematoma overlies the lateral malleolar fracture site measuring approximately 3.6 x 0.9 cm. Atherosclerotic vascular calcifications are present. IMPRESSION: 1. Acute comminuted nondisplaced fractures of the medial and lateral  malleoli, as described above. 2. Small hematoma overlies the lateral malleolar fracture site. Electronically Signed   By: Leverne Reading D.O.   On: 06/15/2021 08:42    Assessment & Plan:   Problem List Items Addressed This Visit     Essential hypertension   BP Readings from Last 3 Encounters:  10/15/23 128/74  07/18/23 110/68  07/04/23 138/79   On Toprol       Relevant Orders   Comprehensive metabolic panel with GFR (Completed)   CBC with Differential/Platelet (Completed)   TSH (Completed)   Urinalysis   Coronary atherosclerosis - Primary   Memory loss   No change in MS       Diarrhea     Started Entocort  - much better F/u w/GI      Relevant Orders   Comprehensive metabolic panel with GFR (Completed)   CBC with Differential/Platelet (Completed)   TSH (Completed)   Falls   LBP - contusion Will watch UA         Meds ordered this encounter  Medications   doxycycline  (VIBRA -TABS) 100 MG tablet    Sig: Take 1 tablet (100 mg total) by mouth 2 (two) times daily.    Dispense:  10 tablet    Refill:  1   erythromycin ophthalmic ointment    Sig: Place 1 Application into the left eye 3 (three) times daily.    Dispense:  3.5 g    Refill:  0   traMADol  (ULTRAM ) 50 MG tablet    Sig: Take 1-2 tablets (50-100 mg total) by mouth every 12 (twelve) hours as needed.    Dispense:  120 tablet    Refill:  1      Follow-up: No follow-ups on file.  Anitra Barn, MD

## 2023-10-15 NOTE — Progress Notes (Signed)
 Pt had PCP apt today and was prescribed doxycycline  and tramadol . Doxycycline  has a potential interaction with warfarin. Tramadol  interacts and causes an increase in INR.  Hold dose today and then change weekly dose to take 1 tablets daily. Recheck INR in 2 weeks.

## 2023-10-15 NOTE — Telephone Encounter (Signed)
 Pharmacy Patient Advocate Encounter   Received notification from CoverMyMeds that prior authorization for TRAMADOL  50MG  TABS  is required/requested.   Insurance verification completed.   The patient is insured through [facility] .   Per test claim: PA required; PA submitted to above mentioned insurance via CoverMyMeds Key/confirmation #/EOC [id number] Status is pending

## 2023-10-17 ENCOUNTER — Ambulatory Visit: Payer: Self-pay | Admitting: Internal Medicine

## 2023-10-21 ENCOUNTER — Encounter: Payer: Self-pay | Admitting: Internal Medicine

## 2023-10-21 NOTE — Assessment & Plan Note (Signed)
 LBP - contusion Will watch UA

## 2023-10-21 NOTE — Assessment & Plan Note (Signed)
   Started Entocort  - much better F/u w/GI

## 2023-10-21 NOTE — Assessment & Plan Note (Signed)
 BP Readings from Last 3 Encounters:  10/15/23 128/74  07/18/23 110/68  07/04/23 138/79   On Toprol 

## 2023-10-21 NOTE — Assessment & Plan Note (Signed)
 No change in MS

## 2023-10-27 ENCOUNTER — Ambulatory Visit

## 2023-10-27 ENCOUNTER — Other Ambulatory Visit (INDEPENDENT_AMBULATORY_CARE_PROVIDER_SITE_OTHER)

## 2023-10-27 DIAGNOSIS — I10 Essential (primary) hypertension: Secondary | ICD-10-CM | POA: Diagnosis not present

## 2023-10-27 DIAGNOSIS — Z7901 Long term (current) use of anticoagulants: Secondary | ICD-10-CM

## 2023-10-27 LAB — URINALYSIS, ROUTINE W REFLEX MICROSCOPIC
Bilirubin Urine: NEGATIVE
Hgb urine dipstick: NEGATIVE
Ketones, ur: NEGATIVE
Nitrite: NEGATIVE
Specific Gravity, Urine: 1.015 (ref 1.000–1.030)
Total Protein, Urine: NEGATIVE
Urine Glucose: NEGATIVE
Urobilinogen, UA: 0.2 (ref 0.0–1.0)
pH: 6 (ref 5.0–8.0)

## 2023-10-27 LAB — POCT INR: INR: 1.4 — AB (ref 2.0–3.0)

## 2023-10-27 NOTE — Patient Instructions (Addendum)
 Pre visit review using our clinic review tool, if applicable. No additional management support is needed unless otherwise documented below in the visit note.  Increase dose today to take 1 1/2 tablets and then change weekly dose to take 1 tablets daily except take 1 1/2 tablets on Mondays. Recheck INR in 2 weeks.

## 2023-10-27 NOTE — Progress Notes (Signed)
 Pt was prescribed doxycycline  on 5/21 for eye infection but did not take it. Increase dose today to take 1 1/2 tablets and then change weekly dose to take 1 tablets daily except take 1 1/2 tablets on Mondays. Recheck INR in 2 weeks.

## 2023-10-29 NOTE — Telephone Encounter (Signed)
 Copied from CRM [id number]. Topic: Clinical - Lab/Test Results >> Oct 29, 2023  9:06 AM Juleen Oakland F wrote: Reason for CRM: Patient had urinalysis done 10/27/23, her daughter Rozann Cornell would like a call to go over results at [phone]

## 2023-10-30 ENCOUNTER — Other Ambulatory Visit: Payer: Self-pay | Admitting: Internal Medicine

## 2023-10-30 NOTE — Telephone Encounter (Unsigned)
 Copied from CRM [id number]. Topic: Clinical - Medication Refill >> Oct 30, 2023  9:42 AM Clyde Darling P wrote: Medication: potassium chloride  SA (KLOR-CON  M) 20 MEQ tablet  Has the patient contacted their pharmacy? Yes- no more refills (Agent: If no, request that the patient contact the pharmacy for the refill. If patient does not wish to contact the pharmacy document the reason why and proceed with request.) (Agent: If yes, when and what did the pharmacy advise?)  This is the patient's preferred pharmacy:  [facility] [address] [place] - [address] [place] [address] Phone: [phone] Fax: [phone]  Is this the correct pharmacy for this prescription? Yes If no, delete pharmacy and type the correct one.   Has the prescription been filled recently? No  Is the patient out of the medication? Yes  Has the patient been seen for an appointment in the last year OR does the patient have an upcoming appointment? Yes  Can we respond through MyChart? Yes  Agent: Please be advised that Rx refills may take up to 3 business days. We ask that you follow-up with your pharmacy.

## 2023-10-31 MED ORDER — POTASSIUM CHLORIDE CRYS ER 20 MEQ PO TBCR: 20.0000 meq | EXTENDED_RELEASE_TABLET | Freq: Every day | ORAL | 1 refills | Status: DC

## 2023-11-02 ENCOUNTER — Ambulatory Visit: Payer: Self-pay | Admitting: Internal Medicine

## 2023-11-02 MED ORDER — NITROFURANTOIN MONOHYD MACRO 100 MG PO CAPS: 100.0000 mg | ORAL_CAPSULE | Freq: Two times a day (BID) | ORAL | 0 refills | Status: AC

## 2023-11-11 ENCOUNTER — Ambulatory Visit

## 2023-11-11 ENCOUNTER — Telehealth: Payer: Self-pay | Admitting: Internal Medicine

## 2023-11-11 DIAGNOSIS — Z7901 Long term (current) use of anticoagulants: Secondary | ICD-10-CM

## 2023-11-11 DIAGNOSIS — R350 Frequency of micturition: Secondary | ICD-10-CM

## 2023-11-11 LAB — POCT INR: INR: 1.5 — AB (ref 2.0–3.0)

## 2023-11-11 NOTE — Patient Instructions (Addendum)
 Pre visit review using our clinic review tool, if applicable. No additional management support is needed unless otherwise documented below in the visit note.  Increase dose today and tomorrow to take 1 1/2 tablets and then change weekly dose to take 1 tablets daily except take 1 1/2 tablets on Mondays and Friday. Recheck INR in 2 weeks.

## 2023-11-11 NOTE — Telephone Encounter (Unsigned)
 Copied from CRM [id number]. Topic: Clinical - Request for Lab/Test Order >> Nov 11, 2023 11:55 AM [place] J wrote: Reason for CRM: Patient's daughter calling in to see if the patient could repeat her urinanalysis test to see how she's doing now and if there is any improvement or if she would need an appointment in order for that to be completed.   Please call Lavera Postal (daughter) for an update.  Patient's daughter: [phone]

## 2023-11-11 NOTE — Progress Notes (Cosign Needed)
 Increase dose today and tomorrow to take 1 1/2 tablets and then change weekly dose to take 1 tablets daily except take 1 1/2 tablets on Mondays and Friday. Recheck INR in 2 weeks.

## 2023-11-12 ENCOUNTER — Ambulatory Visit (HOSPITAL_BASED_OUTPATIENT_CLINIC_OR_DEPARTMENT_OTHER)

## 2023-11-12 ENCOUNTER — Ambulatory Visit (HOSPITAL_BASED_OUTPATIENT_CLINIC_OR_DEPARTMENT_OTHER): Payer: Self-pay | Admitting: Orthopaedic Surgery

## 2023-11-12 ENCOUNTER — Ambulatory Visit (HOSPITAL_BASED_OUTPATIENT_CLINIC_OR_DEPARTMENT_OTHER): Admitting: Orthopaedic Surgery

## 2023-11-12 DIAGNOSIS — M19011 Primary osteoarthritis, right shoulder: Secondary | ICD-10-CM | POA: Diagnosis not present

## 2023-11-12 DIAGNOSIS — M25511 Pain in right shoulder: Secondary | ICD-10-CM | POA: Diagnosis not present

## 2023-11-12 DIAGNOSIS — G8929 Other chronic pain: Secondary | ICD-10-CM

## 2023-11-12 NOTE — Progress Notes (Signed)
 Chief Complaint: Right shoulder pain     History of Present Illness:    Alexis [name] is a 89 y.o. female right-hand-dominant female presents with progressive right shoulder pain over the last several years.  She has now had multiple injections from her primary care doctor which has given her several months of relief at a time although these are subsequently wearing off.  She does have quite a significant cardiac history although she has undergone several surgeries recently in the last several years.  She did tolerate these successfully despite her cardiac history.  She is here today with continued limited overhead range of motion and activity.  She is here today with her daughter    PMH/PSH/Family History/Social History/Meds/Allergies:    Past Medical History:  Diagnosis Date   Abnormal brain MRI    Adjustment disorder with mixed anxiety and depressed mood 07/09/2016   Anxiety    Atrial fibrillation (HCC)    CAD (coronary artery disease) of artery bypass graft    Cerebrovascular disease    Chronic atrial fibrillation (HCC) 05/31/2008   Atrial Fibrillation   Congestive heart failure (HCC) 05/31/2008   2011 - resolved s/p CABG and valve replacement Altace     CONSTIPATION 05/31/2008   Qualifier: Diagnosis of  By: Orvan Blanch     Coronary atherosclerosis 05/31/2008   S/p CABG 2011   Coronary Artery Disease  10/1 IMO update   CVA (cerebral vascular accident) (HCC) 05/2000   memory issues, dizziness since (07/08/2016   Depression    Dizzinesses    often since [facility] 2009 (07/08/2016)   Eczema 06/06/2016   1/18 back   Encounter for monitoring Coumadin  therapy 03/30/2012   2011    Endocarditis dx'd 05/2016   Essential hypertension 05/31/2008   Chronic      Fever 12/03/2016   2/18 h/o Strep viridance bacteremia in 2/18. PICC line is out.  We may need to remove her 2 remaining teeth   Foot fracture, left    no OR; from fall   Gastroenteritis 06/20/2014   Likely viral 1/16     GERD (gastroesophageal reflux disease)    hx   High cholesterol    History of blood transfusion ~ 1960   Hypertension    Long term current use of anticoagulant therapy 06/13/2017   Long-term (current) use of anticoagulants, INR goal 2.0-3.0 06/26/2017   Medication reaction 07/08/2016   Memory loss 08/16/2013   3/15 mild 2019 Worse Start Aricept   B complex qd   Mitral valve replaced 07/08/2016   Myalgia 05/31/2008   Qualifier: Diagnosis of  By: Orvan Blanch     Orthostatic hypotension 06/20/2014   Likely due to viral gastroenteritis 1/16 Hold Ramipril  and Spironolactone  x 3 days Labs    Other chest pain 05/31/2008   1/18 L scapula  MSK after raking leaves No CP now  Atypical Chest Pain   Pneumonia 1-2 X   Polyarthritis rheumatica (HCC)    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Seizures (HCC)    2/18 abn EEG ???thalamic CVA - somnolent On Keppra    Shoulder blade pain 06/06/2016   1/18 MSK   Streptococcal endocarditis 07/08/2016   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 Stop date for antibiotic treatment was March 12. Due to multiple reactions and borderline renal function  she is now on vancomycin  and genta   Past Surgical History:  Procedure Laterality Date   AORTIC VALVE REPLACEMENT  03/2008   CARDIAC VALVE REPLACEMENT     CATARACT EXTRACTION W/ INTRAOCULAR LENS IMPLANT Left    CORONARY ANGIOPLASTY  2009   CORONARY ARTERY BYPASS GRAFT  03/2008   MITRAL VALVE REPAIR (MV)/CORONARY ARTERY BYPASS GRAFTING (CABG)  03/2008   2009 [facility]   TEE WITHOUT CARDIOVERSION N/A 06/26/2016   Procedure: TRANSESOPHAGEAL ECHOCARDIOGRAM (TEE);  Surgeon: Loyde Rule, MD;  Location: [facility] ENDOSCOPY;  Service: Cardiovascular;  Laterality: N/A;   Social History   Socioeconomic History    Marital status: Widowed    Spouse name: Not on file   Number of children: Not on file   Years of education: Not on file   Highest education level: Associate degree: academic program  Occupational History   Not on file  Tobacco Use   Smoking status: Never   Smokeless tobacco: Never  Vaping Use   Vaping status: Never Used  Substance and Sexual Activity   Alcohol use: No   Drug use: No   Sexual activity: Not on file  Other Topics Concern   Not on file  Social History Narrative   Right handed    Caffeine : drinks 2 cups decaf coffee per day   2 cups tea per day   Social Drivers of Health   Financial Resource Strain: High Risk (05/05/2023)   Overall Financial Resource Strain (CARDIA)    Difficulty of Paying Living Expenses: Hard  Food Insecurity: Food Insecurity Present (05/05/2023)   Hunger Vital Sign    Worried About Running Out of Food in the Last Year: Sometimes true    Ran Out of Food in the Last Year: Sometimes true  Transportation Needs: Unmet Transportation Needs (05/05/2023)   PRAPARE - Transportation    Lack of Transportation (Medical): Yes    Lack of Transportation (Non-Medical): Yes  Physical Activity: Insufficiently Active (05/05/2023)   Exercise Vital Sign    Days of Exercise per Week: 2 days    Minutes of Exercise per Session: 20 min  Stress: Stress Concern Present (05/05/2023)   [employer] of Occupational Health - Occupational Stress Questionnaire    Feeling of Stress : To some extent  Social Connections: Socially Isolated (05/05/2023)   Social Connection and Isolation Panel    Frequency of Communication with Friends and Family: Once a week    Frequency of Social Gatherings with Friends and Family: Once a week    Attends Religious Services: 1 to 4 times per year    Active Member of [employer] or Organizations: No    Attends Banker Meetings: Not on file    Marital Status: Widowed   Family History  Problem Relation Age of Onset   Stroke Maternal  Grandmother    Lung cancer Brother    CAD Neg Hx    Allergies  Allergen Reactions   Hydralazine Other (See Comments)    headache headache    Bumex  [Bumetanide ]     ?diarrhea   Ceftriaxone  Other (See Comments)    Per MD progress note 2/12:  Infusion reaction.  Denies other associated symptoms such as diaphoresis, nausea, vomiting, dysuria, diarrhea, lower abdominal pain, received palpitations, swelling, difficulty breathing   Eliquis [Apixaban]     Bad diarrhea    Hydralazine Hcl Other (See Comments)    Headache    Oxycodone      confusion   Current Outpatient Medications  Medication  Sig Dispense Refill   acetaminophen  (TYLENOL ) 500 MG tablet Take 500 mg by mouth every 6 (six) hours as needed.     allopurinol  (ZYLOPRIM ) 100 MG tablet Take 0.5 tablets (50 mg total) by mouth daily. Annual appt is due must see provider for future refills 45 tablet 3   aspirin  EC 81 MG tablet Take 81 mg by mouth daily.     atorvastatin  (LIPITOR) 10 MG tablet Take 1 tablet (10 mg total) by mouth at bedtime. 90 tablet 3   B Complex CAPS Take 1 capsule by mouth every evening.     budesonide  (ENTOCORT EC ) 3 MG 24 hr capsule Take 3 capsules (9 mg total) by mouth daily. 90 capsule 5   butalbital -acetaminophen -caffeine  (FIORICET) 50-325-40 MG tablet Take 1-2 tablets by mouth every 6 (six) hours as needed for headache. 60 tablet 0   Cholecalciferol  (VITAMIN D3) 50 MCG (2000 UT) capsule Take 1 capsule (2,000 Units total) by mouth daily. 100 capsule 3   colestipol (COLESTID) 1 g tablet Take 1 g by mouth daily.     diphenoxylate -atropine  (LOMOTIL ) 2.5-0.025 MG tablet Take 1 tablet by mouth 4 (four) times daily as needed for diarrhea or loose stools. 60 tablet 1   doxycycline  (VIBRA -TABS) 100 MG tablet Take 1 tablet (100 mg total) by mouth 2 (two) times daily. 10 tablet 1   erythromycin  ophthalmic ointment Place 1 Application into the left eye 3 (three) times daily. 3.5 g 0   escitalopram  (LEXAPRO ) 5 MG tablet  Take 1 tablet (5 mg total) by mouth daily. 90 tablet 1   ketoconazole (NIZORAL) 2 % cream Apply 1 Application topically 2 (two) times daily.     loperamide (IMODIUM) 2 MG capsule Take 2 mg by mouth as needed.     melatonin 3 MG TABS tablet Take 3 mg by mouth at bedtime as needed.     metoprolol  succinate (TOPROL -XL) 50 MG 24 hr tablet Take 1 tablet (50 mg total) by mouth daily. Overdue for appt due must see provider for future refills 90 tablet 3   mineral oil-hydrophilic petrolatum (AQUAPHOR) ointment Apply topically as needed for dry skin. 420 g 3   mupirocin  ointment (BACTROBAN ) 2 % Apply 1 Application topically 2 (two) times daily.     nitrofurantoin , macrocrystal-monohydrate, (MACROBID ) 100 MG capsule Take 1 capsule (100 mg total) by mouth 2 (two) times daily. 20 capsule 0   potassium chloride  SA (KLOR-CON  M) 20 MEQ tablet Take 1 tablet (20 mEq total) by mouth daily. 90 tablet 1   traMADol  (ULTRAM ) 50 MG tablet Take 1-2 tablets (50-100 mg total) by mouth every 12 (twelve) hours as needed. 120 tablet 1   triamcinolone  cream (KENALOG ) 0.1 % Apply 1 Application topically 2 (two) times daily. 160 g 3   warfarin (COUMADIN ) 1 MG tablet TAKE 1 TABLET BY MOUTH DAILY EXCEPT TAKE 1 1/2 TABLETS ON MONDAY, WEDNESDAY AND FRIDAY OR AS DIRECTED BY ANTICOAGULATION CLINIC 135 tablet 1   No current facility-administered medications for this visit.   No results found.  Review of Systems:   A ROS was performed including pertinent positives and negatives as documented in the HPI.  Physical Exam :   Constitutional: NAD and appears stated age Neurological: Alert and oriented Psych: Appropriate affect and cooperative There were no vitals taken for this visit.   Comprehensive Musculoskeletal Exam:    Right shoulder with forward elevation to approximately 70 degrees external rotation at the side is to neutral.  Internal rotation deferred today she  is sitting in a wheelchair.  This is baseline for her.  Distal  neurosensory exam is intact.  There is painful crepitus with gentle passive motion   Imaging:   Xray (3 views right shoulder): Advanced rotator cuff arthropathy    I personally reviewed and interpreted the radiographs.   Assessment and Plan:   89 y.o. female with right shoulder pain consistent with advanced rotator cuff arthropathy.  At this time she is failing conservative management.  Unfortunately I did discuss that there would not really be any significant additional options as her injections are no longer helping her.  We did discuss the possibility of right shoulder reverse shoulder arthroplasty.  I did discuss that I would like her to extensively be seen with cardiology and primary care to assess her risk with anesthesia with interscalene block as this would be a general anesthesia.  I did ultimately discuss the risks and limitations associated with this.  Ultimately after long discussion with her and her family they are trending towards this  -Plan for possible right shoulder reverse shoulder arthroplasty   After a lengthy discussion of treatment options, including risks, benefits, alternatives, complications of surgical and nonsurgical conservative options, the patient elected surgical repair.   The patient  is aware of the material risks  and complications including, but not limited to injury to adjacent structures, neurovascular injury, infection, numbness, bleeding, implant failure, thermal burns, stiffness, persistent pain, failure to heal, disease transmission from allograft, need for further surgery, dislocation, anesthetic risks, blood clots, risks of death,and others. The probabilities of surgical success and failure discussed with patient given their particular co-morbidities.The time and nature of expected rehabilitation and recovery was discussed.The patient's questions were all answered preoperatively.  No barriers to understanding were noted. I explained the natural history  of the disease process and Rx rationale.  I explained to the patient what I considered to be reasonable expectations given their personal situation.  The final treatment plan was arrived at through a shared patient decision making process model.    I personally saw and evaluated the patient, and participated in the management and treatment plan.  Wilhelmenia Harada, MD Attending Physician, Orthopedic Surgery  This document was dictated using Dragon voice recognition software. A reasonable attempt at proof reading has been made to minimize errors.

## 2023-11-13 ENCOUNTER — Encounter (HOSPITAL_BASED_OUTPATIENT_CLINIC_OR_DEPARTMENT_OTHER): Payer: Self-pay | Admitting: Orthopaedic Surgery

## 2023-11-13 NOTE — Telephone Encounter (Signed)
 Okay to order urinalysis and urine culture.  Thanks

## 2023-11-17 ENCOUNTER — Inpatient Hospital Stay: Admission: RE | Admit: 2023-11-17 | Source: Ambulatory Visit

## 2023-11-19 ENCOUNTER — Other Ambulatory Visit (INDEPENDENT_AMBULATORY_CARE_PROVIDER_SITE_OTHER)

## 2023-11-19 DIAGNOSIS — R350 Frequency of micturition: Secondary | ICD-10-CM | POA: Diagnosis not present

## 2023-11-19 NOTE — Telephone Encounter (Signed)
 Spoke with pts daughter and she has understanding that UA and culture has been ordered. Pt is needing to drop off urine sample.

## 2023-11-20 LAB — URINE CULTURE

## 2023-11-20 LAB — URINALYSIS, ROUTINE W REFLEX MICROSCOPIC
Bilirubin Urine: NEGATIVE
Hgb urine dipstick: NEGATIVE
Ketones, ur: NEGATIVE
Nitrite: NEGATIVE
RBC / HPF: NONE SEEN (ref 0–?)
Specific Gravity, Urine: 1.01 (ref 1.000–1.030)
Total Protein, Urine: NEGATIVE
Urine Glucose: NEGATIVE
Urobilinogen, UA: 0.2 (ref 0.0–1.0)
pH: 6 (ref 5.0–8.0)

## 2023-11-21 ENCOUNTER — Ambulatory Visit: Payer: Self-pay | Admitting: Internal Medicine

## 2023-11-25 ENCOUNTER — Ambulatory Visit

## 2023-11-25 DIAGNOSIS — Z7901 Long term (current) use of anticoagulants: Secondary | ICD-10-CM

## 2023-11-25 LAB — POCT INR: INR: 2.1 (ref 2.0–3.0)

## 2023-11-25 MED ORDER — WARFARIN SODIUM 1 MG PO TABS: ORAL_TABLET | ORAL | 1 refills | Status: AC

## 2023-11-25 NOTE — Progress Notes (Cosign Needed Addendum)
 Pt and her daughter have discussed with ortho about proceeding with shoulder surgery. Pt has an apt tomorrow with PCP and cardiology for clearance. Pt's daughter will make coumadin  clinic aware if surgery is scheduled.  Continue 1 tablets daily except take 1 1/2 tablets on Mondays and Friday. Recheck INR in 4 weeks.  Pt is compliant with warfarin management and PCP apts.  Sent in refill of warfarin to requested pharmacy.    Medical screening examination/treatment/procedure(s) were performed by non-physician practitioner and as supervising physician I was immediately available for consultation/collaboration.  I agree with above. Karlynn Noel, MD

## 2023-11-25 NOTE — Patient Instructions (Addendum)
 Pre visit review using our clinic review tool, if applicable. No additional management support is needed unless otherwise documented below in the visit note.  Continue 1 tablets daily except take 1 1/2 tablets on Mondays and Friday. Recheck INR in 4 weeks.

## 2023-11-26 ENCOUNTER — Ambulatory Visit: Admitting: Internal Medicine

## 2023-11-26 ENCOUNTER — Encounter: Payer: Self-pay | Admitting: Internal Medicine

## 2023-11-26 VITALS — BP 102/78 | HR 73 | Temp 98.0°F | Ht 59.0 in

## 2023-11-26 DIAGNOSIS — I509 Heart failure, unspecified: Secondary | ICD-10-CM | POA: Diagnosis not present

## 2023-11-26 DIAGNOSIS — I10 Essential (primary) hypertension: Secondary | ICD-10-CM | POA: Diagnosis not present

## 2023-11-26 DIAGNOSIS — Z7901 Long term (current) use of anticoagulants: Secondary | ICD-10-CM | POA: Diagnosis not present

## 2023-11-26 DIAGNOSIS — Z01818 Encounter for other preprocedural examination: Secondary | ICD-10-CM | POA: Diagnosis not present

## 2023-11-26 DIAGNOSIS — R197 Diarrhea, unspecified: Secondary | ICD-10-CM | POA: Diagnosis not present

## 2023-11-26 NOTE — Progress Notes (Signed)
 Subjective:  Patient ID: Alexis [name], female    DOB: 1934-01-30  Age: 89 y.o. MRN: [id number]  CC: Medical Management of Chronic Issues (Surgical Clearance... Pt also has concerns about legs having numbness feeling in feet as well x2 weeks. Pt has complaints about above sxs more when she is laying down then sitting.)   HPI [name] presents for right shoulder reverse shoulder arthroplasty med clearance.  Requested by Dr Gonzella Follow-up on anticoagulation, congestive heart failure, dementia, A-fib  The patient is here with her daughter.  Outpatient Medications Prior to Visit  Medication Sig Dispense Refill   acetaminophen  (TYLENOL ) 500 MG tablet Take 500 mg by mouth every 6 (six) hours as needed.     allopurinol  (ZYLOPRIM ) 100 MG tablet Take 0.5 tablets (50 mg total) by mouth daily. Annual appt is due must see provider for future refills 45 tablet 3   aspirin  EC 81 MG tablet Take 81 mg by mouth daily.     atorvastatin  (LIPITOR) 10 MG tablet Take 1 tablet (10 mg total) by mouth at bedtime. 90 tablet 3   B Complex CAPS Take 1 capsule by mouth every evening.     budesonide  (ENTOCORT EC ) 3 MG 24 hr capsule Take 3 capsules (9 mg total) by mouth daily. 90 capsule 5   butalbital -acetaminophen -caffeine  (FIORICET) 50-325-40 MG tablet Take 1-2 tablets by mouth every 6 (six) hours as needed for headache. 60 tablet 0   Cholecalciferol  (VITAMIN D3) 50 MCG (2000 UT) capsule Take 1 capsule (2,000 Units total) by mouth daily. 100 capsule 3   colestipol (COLESTID) 1 g tablet Take 1 g by mouth daily.     diphenoxylate -atropine  (LOMOTIL ) 2.5-0.025 MG tablet Take 1 tablet by mouth 4 (four) times daily as needed for diarrhea or loose stools. 60 tablet 1   doxycycline  (VIBRA -TABS) 100 MG tablet Take 1 tablet (100 mg total) by mouth 2 (two) times daily. 10 tablet 1   erythromycin  ophthalmic ointment Place 1 Application into the left eye 3 (three) times daily. 3.5 g 0   escitalopram   (LEXAPRO ) 5 MG tablet Take 1 tablet (5 mg total) by mouth daily. 90 tablet 1   ketoconazole (NIZORAL) 2 % cream Apply 1 Application topically 2 (two) times daily.     loperamide (IMODIUM) 2 MG capsule Take 2 mg by mouth as needed.     melatonin 3 MG TABS tablet Take 3 mg by mouth at bedtime as needed.     metoprolol  succinate (TOPROL -XL) 50 MG 24 hr tablet Take 1 tablet (50 mg total) by mouth daily. Overdue for appt due must see provider for future refills 90 tablet 3   mineral oil-hydrophilic petrolatum (AQUAPHOR) ointment Apply topically as needed for dry skin. 420 g 3   mupirocin  ointment (BACTROBAN ) 2 % Apply 1 Application topically 2 (two) times daily.     nitrofurantoin , macrocrystal-monohydrate, (MACROBID ) 100 MG capsule Take 1 capsule (100 mg total) by mouth 2 (two) times daily. 20 capsule 0   potassium chloride  SA (KLOR-CON  M) 20 MEQ tablet Take 1 tablet (20 mEq total) by mouth daily. 90 tablet 1   traMADol  (ULTRAM ) 50 MG tablet Take 1-2 tablets (50-100 mg total) by mouth every 12 (twelve) hours as needed. 120 tablet 1   triamcinolone  cream (KENALOG ) 0.1 % Apply 1 Application topically 2 (two) times daily. 160 g 3   warfarin (COUMADIN ) 1 MG tablet TAKE 1 TABLET BY MOUTH DAILY EXCEPT TAKE 1 1/2 TABLETS ON MONDAY AND FRIDAY OR AS DIRECTED BY  ANTICOAGULATION CLINIC 115 tablet 1   No facility-administered medications prior to visit.    ROS: Review of Systems  Constitutional:  Positive for fatigue. Negative for activity change, appetite change, chills and unexpected weight change.  HENT:  Negative for congestion, mouth sores, sinus pressure, sore throat and trouble swallowing.   Eyes:  Negative for visual disturbance.  Respiratory:  Positive for shortness of breath. Negative for cough, chest tightness and wheezing.   Cardiovascular:  Positive for leg swelling. Negative for palpitations.  Gastrointestinal:  Negative for abdominal pain and nausea.  Genitourinary:  Negative for difficulty  urinating, frequency and vaginal pain.  Musculoskeletal:  Positive for arthralgias and gait problem. Negative for back pain.  Skin:  Negative for pallor and rash.  Neurological:  Negative for dizziness, tremors, weakness, numbness and headaches.  Hematological:  Bruises/bleeds easily.  Psychiatric/Behavioral:  Positive for confusion and decreased concentration. Negative for sleep disturbance and suicidal ideas. The patient is not nervous/anxious.     Objective:  BP 102/78   Pulse 73   Temp 98 F (36.7 C) (Oral)   Ht 4' 11 (1.499 m)   SpO2 98%   BMI 34.74 kg/m   BP Readings from Last 3 Encounters:  11/26/23 102/78  10/15/23 128/74  07/18/23 110/68    Wt Readings from Last 3 Encounters:  07/18/23 172 lb (78 kg)  07/04/23 172 lb (78 kg)  06/03/23 173 lb 3.2 oz (78.6 kg)    Physical Exam Constitutional:      General: She is not in acute distress.    Appearance: She is well-developed. She is obese.  HENT:     Head: Normocephalic.     Right Ear: External ear normal.     Left Ear: External ear normal.     Nose: Nose normal.  Eyes:     General:        Right eye: No discharge.        Left eye: No discharge.     Conjunctiva/sclera: Conjunctivae normal.     Pupils: Pupils are equal, round, and reactive to light.  Neck:     Thyroid : No thyromegaly.     Vascular: No JVD.     Trachea: No tracheal deviation.  Cardiovascular:     Rate and Rhythm: Normal rate and regular rhythm.     Heart sounds: Normal heart sounds.  Pulmonary:     Effort: No respiratory distress.     Breath sounds: No stridor. No wheezing.  Abdominal:     General: Bowel sounds are normal. There is no distension.     Palpations: Abdomen is soft. There is no mass.     Tenderness: There is no abdominal tenderness. There is no guarding or rebound.  Musculoskeletal:        General: No tenderness.     Cervical back: Normal range of motion and neck supple. No rigidity.     Right lower leg: Edema present.      Left lower leg: Edema present.  Lymphadenopathy:     Cervical: No cervical adenopathy.  Skin:    Findings: No erythema or rash.  Neurological:     Mental Status: She is oriented to person, place, and time. Mental status is at baseline.     Cranial Nerves: No cranial nerve deficit.     Motor: Weakness present. No abnormal muscle tone.     Coordination: Coordination abnormal.     Gait: Gait abnormal.     Deep Tendon Reflexes: Reflexes normal.  Psychiatric:  Behavior: Behavior normal.        Thought Content: Thought content normal.        Judgment: Judgment normal.   Needs a new w/c  In a wheelchair Trace ankle edema Alert, cooperative Right shoulder is painful with range of motion  Lab Results  Component Value Date   WBC 9.4 10/15/2023   HGB 12.8 10/15/2023   HCT 38.2 10/15/2023   PLT 292.0 10/15/2023   GLUCOSE 100 (H) 10/15/2023   CHOL 183 03/03/2023   TRIG 227.0 (H) 03/03/2023   HDL 52.10 03/03/2023   LDLDIRECT 90.0 01/10/2021   LDLCALC 86 03/03/2023   ALT 13 10/15/2023   AST 19 10/15/2023   NA 140 10/15/2023   K 3.7 10/15/2023   CL 103 10/15/2023   CREATININE 1.27 (H) 10/15/2023   BUN 22 10/15/2023   CO2 28 10/15/2023   TSH 3.88 10/15/2023   INR 2.1 11/25/2023   HGBA1C 6.0 03/03/2023    CT ANKLE RIGHT WO CONTRAST Result Date: 06/15/2021 CLINICAL DATA:  Evaluate right ankle fracture EXAM: CT OF THE RIGHT ANKLE WITHOUT CONTRAST TECHNIQUE: Multidetector CT imaging of the right ankle was performed according to the standard protocol. Multiplanar CT image reconstructions were also generated. RADIATION DOSE REDUCTION: This exam was performed according to the departmental dose-optimization program which includes automated exposure control, adjustment of the mA and/or kV according to patient size and/or use of iterative reconstruction technique. COMPARISON:  X-ray 06/11/2021 FINDINGS: Bones/Joint/Cartilage Acute comminuted nondisplaced fracture of the medial malleolus  with horizontal and vertical components. Obliquely oriented nondisplaced fracture through the lateral malleolus. No posterior malleolar fracture component. Ankle mortise is congruent without widening or dislocation. Talus and calcaneus are intact. Subtalar joints are aligned. No acute fracture or malalignment within the midfoot. Moderate degenerative changes are most pronounced at the second through fourth tarsometatarsal joints. Bones are demineralized. Ligaments Suboptimally assessed by CT. Muscles and Tendons No acute musculotendinous abnormality by CT. Soft tissues Soft tissue swelling and edema. Small hematoma overlies the lateral malleolar fracture site measuring approximately 3.6 x 0.9 cm. Atherosclerotic vascular calcifications are present. IMPRESSION: 1. Acute comminuted nondisplaced fractures of the medial and lateral malleoli, as described above. 2. Small hematoma overlies the lateral malleolar fracture site. Electronically Signed   By: Mabel Converse D.O.   On: 06/15/2021 08:42    Assessment & Plan:   Problem List Items Addressed This Visit     Essential hypertension   On Toprol .  Blood pressure is controlled      Congestive heart failure (HCC)   Chronic.  Compensated at present.  Currently off diuretics      Long term current use of anticoagulant therapy   On Coumadin       Diarrhea   Chronic diarrhea.  Likely due to microscopic colitis versus other.  Compensated on oral budesonide  9 mg a day      Preop exam for internal medicine - Primary   In my opinion, the patient is moderate to high risk for perioperative complications due to her advanced age, chronic anticoagulation, dementia with occasional confusion, congestive heart failure, A-fib. The patient's dtr Charlie will think about her mother's surgery and follow-up with Dr.Bokshan         No orders of the defined types were placed in this encounter.     Follow-up: Return in about 3 months (around 02/26/2024) for a  follow-up visit.  Marolyn Noel, MD

## 2023-12-03 ENCOUNTER — Ambulatory Visit
Admission: RE | Admit: 2023-12-03 | Discharge: 2023-12-03 | Disposition: A | Discharge status: Home / Self Care | Source: Ambulatory Visit | Attending: Orthopaedic Surgery | Admitting: Orthopaedic Surgery

## 2023-12-03 DIAGNOSIS — M19011 Primary osteoarthritis, right shoulder: Secondary | ICD-10-CM | POA: Diagnosis not present

## 2023-12-03 DIAGNOSIS — G8929 Other chronic pain: Secondary | ICD-10-CM

## 2023-12-12 ENCOUNTER — Telehealth: Payer: Self-pay

## 2023-12-12 NOTE — Telephone Encounter (Signed)
 Copied from CRM [id number]. Topic: Appointments - Scheduling Inquiry for Clinic >> Dec 12, 2023 11:21 AM Mia F wrote: Reason for CRM: Pt daughter called to reschedule pt appt. I believe [facility] is saying to send CRM for this type of appt

## 2023-12-12 NOTE — Telephone Encounter (Signed)
 Contacted pt's daughter, Charlie, and [facility] coumadin  clinic apt.

## 2023-12-14 ENCOUNTER — Encounter: Payer: Self-pay | Admitting: Internal Medicine

## 2023-12-14 DIAGNOSIS — Z01818 Encounter for other preprocedural examination: Secondary | ICD-10-CM | POA: Insufficient documentation

## 2023-12-14 NOTE — Assessment & Plan Note (Signed)
 In my opinion, the patient is moderate to high risk for perioperative complications due to her advanced age, chronic anticoagulation, dementia with occasional confusion, congestive heart failure, A-fib. The patient's dtr Charlie will think about her mother's surgery and follow-up with Dr.Bokshan

## 2023-12-14 NOTE — Assessment & Plan Note (Signed)
 On Coumadin

## 2023-12-14 NOTE — Assessment & Plan Note (Addendum)
 Chronic diarrhea.  Likely due to microscopic colitis versus other.  Compensated on oral budesonide  9 mg a day

## 2023-12-14 NOTE — Assessment & Plan Note (Signed)
 On Toprol .  Blood pressure is controlled

## 2023-12-14 NOTE — Assessment & Plan Note (Signed)
 Chronic.  Compensated at present.  Currently off diuretics

## 2023-12-23 ENCOUNTER — Ambulatory Visit

## 2023-12-23 DIAGNOSIS — Z951 Presence of aortocoronary bypass graft: Secondary | ICD-10-CM | POA: Diagnosis not present

## 2023-12-23 DIAGNOSIS — I2510 Atherosclerotic heart disease of native coronary artery without angina pectoris: Secondary | ICD-10-CM | POA: Diagnosis not present

## 2023-12-23 DIAGNOSIS — Z952 Presence of prosthetic heart valve: Secondary | ICD-10-CM | POA: Diagnosis not present

## 2023-12-23 DIAGNOSIS — I5033 Acute on chronic diastolic (congestive) heart failure: Secondary | ICD-10-CM | POA: Diagnosis not present

## 2023-12-23 DIAGNOSIS — I340 Nonrheumatic mitral (valve) insufficiency: Secondary | ICD-10-CM | POA: Diagnosis not present

## 2023-12-23 DIAGNOSIS — Z01810 Encounter for preprocedural cardiovascular examination: Secondary | ICD-10-CM | POA: Diagnosis not present

## 2023-12-26 ENCOUNTER — Ambulatory Visit

## 2023-12-26 DIAGNOSIS — Z7901 Long term (current) use of anticoagulants: Secondary | ICD-10-CM

## 2023-12-26 LAB — POCT INR: INR: 2.2 (ref 2.0–3.0)

## 2023-12-26 NOTE — Patient Instructions (Addendum)
 Pre visit review using our clinic review tool, if applicable. No additional management support is needed unless otherwise documented below in the visit note.  Continue 1 tablets daily except take 1 1/2 tablets on Mondays and Friday. Recheck INR in 4 weeks.

## 2023-12-26 NOTE — Progress Notes (Cosign Needed Addendum)
 Pt and her daughter have discussed with ortho about proceeding with shoulder surgery. Pt has an apt tomorrow with PCP and cardiology for clearance. Pt's daughter will make coumadin  clinic aware if surgery is scheduled.  Continue 1 tablets daily except take 1 1/2 tablets on Mondays and Friday. Recheck INR in 4 weeks.  Medical screening examination/treatment/procedure(s) were performed by non-physician practitioner and as supervising physician I was immediately available for consultation/collaboration.  I agree with above. Karlynn Noel, MD

## 2023-12-30 ENCOUNTER — Encounter: Payer: Self-pay | Admitting: Internal Medicine

## 2024-01-01 ENCOUNTER — Telehealth: Payer: Self-pay | Admitting: Internal Medicine

## 2024-01-01 NOTE — Telephone Encounter (Signed)
 Copied from CRM [id number]. Topic: General - Other >> Jan 01, 2024 10:31 AM Jasmin G wrote: Reason for CRM: Pt's daughter would like to know if clinic received recent fax from cardiologist as it is needed for upcoming surgery, please call pt back at [phone] to let her know.

## 2024-01-05 ENCOUNTER — Other Ambulatory Visit (HOSPITAL_BASED_OUTPATIENT_CLINIC_OR_DEPARTMENT_OTHER): Payer: Self-pay | Admitting: Orthopaedic Surgery

## 2024-01-05 DIAGNOSIS — G8929 Other chronic pain: Secondary | ICD-10-CM

## 2024-01-12 ENCOUNTER — Telehealth: Payer: Self-pay

## 2024-01-12 DIAGNOSIS — Z7901 Long term (current) use of anticoagulants: Secondary | ICD-10-CM

## 2024-01-12 NOTE — Telephone Encounter (Signed)
 Contacted pt's daughter, Alene, and advised warfarin dosing and whether a Lovenox  bridge will be used around the pt's surgery are being discussed with the PCP. Advised this nurse will f/u with her once that has been discussed and determined.  She requested coumadin  clinic apt be RS to next Tues. Apt has been Rs.  She also reports pt just had a fall earlier today. Pt is denying any pain and reported I already forgot I fell. Pt did not hit her head. Alene is aware of s/s to watch for and if any changes she will take pt to ER or contact the office.

## 2024-01-12 NOTE — Telephone Encounter (Signed)
 Copied from CRM [id number]. Topic: General - Other >> Jan 12, 2024 11:45 AM Turkey A wrote: Reason for CRM: Patient would like for [facility] at the Coumadin  Clinic to give her a call please

## 2024-01-13 NOTE — Telephone Encounter (Signed)
 Yes, please do the bridge: She has an artificial valve. S/p AVR 2009.  Thank you

## 2024-01-13 NOTE — Telephone Encounter (Signed)
 Agree  Thank you

## 2024-01-13 NOTE — Telephone Encounter (Signed)
 Shoulder surgery: 01/29/24  Current warfarin dosing; take 1 tablet (1 mg) except take 1 1/2 tablets (1.5 mg) on Monday and Friday  Actual wt: 78 kg (170% > ideal wt; will use adjusted wt for CrCl calculation Ideal wt: 46 kg  Adjusted wt: 59 kg  CrCl: 29.07 mL/min (using adjusted wt due to actual wt > 125% of ideal wt)  Due to CrCl < 30 mL/min, recommend Lovenox  dose of 1 x actual wt, once daily in the PM  Lovenox  80 mg once daily in the PM  Lovenox  Bridge:  8/30: Take last warfarin 8/31: NO warfarin, NO Lovenox  9/1: NO warfarin, inject Lovenox  once in the PM 9/2: NO warfarin, inject Lovenox  once in the PM 9/3: NO warfarin, NO Lovenox   9/4: SURGERY; NO WARFARIN, NO LOVENOX   9/5: Take 2 tablets (2 mg) warfarin, inject Lovenox  once in the PM 9/6: Take 1 1/2 tablets (1.5 mg) warfarin, inject Lovenox  once in the PM 9/7: Take 1 1/2 tablets (1.5 mg) warfarin, inject Lovenox  once in the PM 9/8: Take 2 tablets (2 mg) warfarin, inject Lovenox  once in the PM 9/9: Take 1 tablet (1 mg) warfarin, inject Lovenox  once in the PM 9/10: Take 1 tablet (1 mg) warfarin, inject Lovenox  once in the PM 9/11: Take 1 tablet ( mg) warfarin, inject Lovenox  once in the PM 9/12: INR CHECK; NO WARFARIN AND NO LOVENOX  UNTIL AFTER INR CHECK

## 2024-01-14 MED ORDER — ENOXAPARIN SODIUM 80 MG/0.8ML IJ SOSY: 80.0000 mg | PREFILLED_SYRINGE | INTRAMUSCULAR | 0 refills | Status: AC

## 2024-01-14 NOTE — Telephone Encounter (Signed)
 Contacted pt's daughter, Vavara, and advised pt will be placed on a Lovenox  bridge for her surgery. Advised instructions will be reviewed on 8/26 coumadin  clinic apt.  She requested Lovenox  prescription be sent to pharmacy now incase they need to order it.   Advised script would be sent and if any problems to contact the coumadin  clinic. Vavara verbalized understanding.   Sent in script to requested pharmacy

## 2024-01-20 ENCOUNTER — Ambulatory Visit

## 2024-01-20 ENCOUNTER — Encounter (HOSPITAL_BASED_OUTPATIENT_CLINIC_OR_DEPARTMENT_OTHER): Payer: Self-pay

## 2024-01-20 DIAGNOSIS — Z7901 Long term (current) use of anticoagulants: Secondary | ICD-10-CM

## 2024-01-20 LAB — POCT INR: INR: 2.4 (ref 2.0–3.0)

## 2024-01-20 NOTE — Progress Notes (Cosign Needed Addendum)
 Pt scheduled for shoulder surgery for 9/4 and will be placed on a lovenox  bridge. Continue 1 tablets daily except take 1 1/2 tablets on Mondays and Friday until starting instructions below. Recheck INR on 9/12. 8/30: Take last warfarin 8/31: NO warfarin, NO Lovenox  9/1: NO warfarin, inject Lovenox  once in the PM 9/2: NO warfarin, inject Lovenox  once in the PM 9/3: NO warfarin, NO Lovenox    9/4: SURGERY; NO WARFARIN, NO LOVENOX    9/5: Take 2 tablets (2 mg) warfarin, inject Lovenox  once in the PM 9/6: Take 1 1/2 tablets (1.5 mg) warfarin, inject Lovenox  once in the PM 9/7: Take 1 1/2 tablets (1.5 mg) warfarin, inject Lovenox  once in the PM 9/8: Take 2 tablets (2 mg) warfarin, inject Lovenox  once in the PM 9/9: Take 1 tablet (1 mg) warfarin, inject Lovenox  once in the PM 9/10: Take 1 tablet (1 mg) warfarin, inject Lovenox  once in the PM 9/11: Take 1 tablet ( mg) warfarin, inject Lovenox  once in the PM 9/12: INR CHECK; NO WARFARIN AND NO LOVENOX  UNTIL AFTER INR CHECK   Medical screening examination/treatment/procedure(s) were performed by non-physician practitioner and as supervising physician I was immediately available for consultation/collaboration.  I agree with above. Karlynn Noel, MD

## 2024-01-20 NOTE — Patient Instructions (Addendum)
 Pre visit review using our clinic review tool, if applicable. No additional management support is needed unless otherwise documented below in the visit note.  Continue 1 tablets daily except take 1 1/2 tablets on Mondays and Friday until starting instructions below. Recheck INR on 9/12. 8/30: Take last warfarin 8/31: NO warfarin, NO Lovenox  9/1: NO warfarin, inject Lovenox  once in the PM 9/2: NO warfarin, inject Lovenox  once in the PM 9/3: NO warfarin, NO Lovenox    9/4: SURGERY; NO WARFARIN, NO LOVENOX    9/5: Take 2 tablets (2 mg) warfarin, inject Lovenox  once in the PM 9/6: Take 1 1/2 tablets (1.5 mg) warfarin, inject Lovenox  once in the PM 9/7: Take 1 1/2 tablets (1.5 mg) warfarin, inject Lovenox  once in the PM 9/8: Take 2 tablets (2 mg) warfarin, inject Lovenox  once in the PM 9/9: Take 1 tablet (1 mg) warfarin, inject Lovenox  once in the PM 9/10: Take 1 tablet (1 mg) warfarin, inject Lovenox  once in the PM 9/11: Take 1 tablet ( mg) warfarin, inject Lovenox  once in the PM 9/12: INR CHECK; NO WARFARIN AND NO LOVENOX  UNTIL AFTER INR CHECK

## 2024-01-21 ENCOUNTER — Encounter (HOSPITAL_COMMUNITY): Payer: Self-pay | Admitting: Anesthesiology

## 2024-01-21 ENCOUNTER — Telehealth: Payer: Self-pay

## 2024-01-21 NOTE — Telephone Encounter (Signed)
 Copied from CRM [id number]. Topic: Appointments - Scheduling Inquiry for Clinic >> Jan 21, 2024 10:28 AM Alexis [name] wrote: Reason for CRM: Patients daughter wants to change patients coumadin  appnt to a earlier time, possibly 8 AM or 8:30 AM. On same day of Sept 12th, and a return call if possible.

## 2024-01-21 NOTE — Progress Notes (Signed)
 Pt ASA of 4 and extensive cardiac hx not day surgery center candidate based on our guidelines. LVM for April at Dr. Danetta office.

## 2024-01-22 NOTE — Telephone Encounter (Signed)
 LVM that apt has been RS for 8 am the same day.

## 2024-01-23 ENCOUNTER — Ambulatory Visit

## 2024-01-23 NOTE — Progress Notes (Addendum)
 Surgical Instructions   Your procedure is scheduled on Thursday, September 4th. Report to [facility] at 1:15 P.M., then check in with the Admitting office. Any questions or running late day of surgery: call [phone]  Questions prior to your surgery date: call [phone], Monday-Friday, 8am-4pm. If you experience any cold or flu symptoms such as cough, fever, chills, shortness of breath, etc. between now and your scheduled surgery, please notify us  at the above number.     Remember:  Do not eat after midnight the night before your surgery  You may drink clear liquids until 12:15 the morning of your surgery.   Clear liquids allowed are: Water, Non-Citrus Juices (without pulp), Carbonated Beverages, Clear Tea (no milk, honey, etc.), Black Coffee Only (NO MILK, CREAM OR POWDERED CREAMER of any kind), and Gatorade.    Take these medicines the morning of surgery with A SIP OF WATER  allopurinol  (ZYLOPRIM )  budesonide  (ENTOCORT EC )  escitalopram  (LEXAPRO )  metoprolol  succinate (TOPROL -XL)    May take these medicines IF NEEDED: acetaminophen  (TYLENOL )    Follow your surgeon's instructions on when to stop Asprin.  If no instructions were given by your surgeon then you will need to call the office to get those instructions.   Follow your prescribing physician's instructions regarding when to stop warfarin (COUMADIN ) and start the enoxaparin  (LOVENOX ) medications.     One week prior to surgery, STOP taking any Aleve, Naproxen, Ibuprofen , Motrin , Advil , Goody's, BC's, all herbal medications, fish oil, and non-prescription vitamins.   Oral Hygiene is also important to reduce your risk of infection.  Remember - BRUSH YOUR TEETH THE MORNING OF SURGERY WITH YOUR REGULAR TOOTHPASTE  [place]- Preparing for Total Shoulder Arthroplasty  Before surgery, you can play an important role. Because skin is not sterile, your skin needs to be as free of germs as possible. You  can reduce the number of germs on your skin by using the following products.   Benzoyl Peroxide Gel  o Reduces the number of germs present on the skin  o Applied twice a day to shoulder area starting two days before surgery   Chlorhexidine  Gluconate (CHG) Soap (instructions listed above on how to wash with CHG Soap)  o An antiseptic cleaner that kills germs and bonds with the skin to continue killing germs even after washing  o Used for showering the night before surgery and morning of surgery   ==================================================================  Please follow these instructions carefully:  BENZOYL PEROXIDE 5% GEL  Please do not use if you have an allergy to benzoyl peroxide. If your skin becomes reddened/irritated stop using the benzoyl peroxide.  Starting two days before surgery, apply as follows:  1. Apply benzoyl peroxide in the morning and at night. Apply after taking a shower. If you are not taking a shower clean entire shoulder front, back, and side along with the armpit with a clean wet washcloth.  2. Place a quarter-sized dollop on your SHOULDER and rub in thoroughly, making sure to cover the front, back, and side of your shoulder, along with the armpit.   2 Days prior to Surgery First Dose on _____________ Morning Second Dose on ______________ Night  Day Before Surgery First Dose on ______________ Morning Night before surgery wash (entire body except face and private areas) with CHG Soap THEN Second Dose on ____________ Night   Morning of Surgery  wash BODY AGAIN with CHG Soap   4. Do NOT apply benzoyl peroxide gel on the day  of surgery              Do NOT Smoke (Tobacco/Vaping) for 24 hours prior to your procedure.  If you use a CPAP at night, you may bring your mask/headgear for your overnight stay.   You will be asked to remove any contacts, glasses, piercing's, hearing aid's, dentures/partials prior to surgery. Please bring cases for  these items if needed.    Patients discharged the day of surgery will not be allowed to drive home, and someone needs to stay with them for 24 hours.  SURGICAL WAITING ROOM VISITATION Patients may have no more than 2 support people in the waiting area - these visitors may rotate.   Pre-op nurse will coordinate an appropriate time for 1 ADULT support person, who may not rotate, to accompany patient in pre-op.  Children under the age of 23 must have an adult with them who is not the patient and must remain in the main waiting area with an adult.  If the patient needs to stay at the hospital during part of their recovery, the visitor guidelines for inpatient rooms apply.  Please refer to the [facility] website for the visitor guidelines for any additional information.   If you received a COVID test during your pre-op visit  it is requested that you wear a mask when out in public, stay away from anyone that may not be feeling well and notify your surgeon if you develop symptoms. If you have been in contact with anyone that has tested positive in the last 10 days please notify you surgeon.      Pre-operative 5 CHG Bathing Instructions   You can play a key role in reducing the risk of infection after surgery. Your skin needs to be as free of germs as possible. You can reduce the number of germs on your skin by washing with CHG (chlorhexidine  gluconate) soap before surgery. CHG is an antiseptic soap that kills germs and continues to kill germs even after washing.   DO NOT use if you have an allergy to chlorhexidine /CHG or antibacterial soaps. If your skin becomes reddened or irritated, stop using the CHG and notify one of our RNs at [phone].   Please shower with the CHG soap starting 4 days before surgery using the following schedule:     Please keep in mind the following:  DO NOT shave, including legs and underarms, starting the day of your first shower.   You may shave your face at  any point before/day of surgery.  Place clean sheets on your bed the day you start using CHG soap. Use a clean washcloth (not used since being washed) for each shower. DO NOT sleep with pets once you start using the CHG.   CHG Shower Instructions:  Wash your face and private area with normal soap. If you choose to wash your hair, wash first with your normal shampoo.  After you use shampoo/soap, rinse your hair and body thoroughly to remove shampoo/soap residue.  Turn the water OFF and apply about 3 tablespoons (45 ml) of CHG soap to a CLEAN washcloth.  Apply CHG soap ONLY FROM YOUR NECK DOWN TO YOUR TOES (washing for 3-5 minutes)  DO NOT use CHG soap on face, private areas, open wounds, or sores.  Pay special attention to the area where your surgery is being performed.  If you are having back surgery, having someone wash your back for you may be helpful. Wait 2 minutes after CHG soap  is applied, then you may rinse off the CHG soap.  Pat dry with a clean towel  Put on clean clothes/pajamas   If you choose to wear lotion, please use ONLY the CHG-compatible lotions that are listed below.  Additional instructions for the day of surgery: DO NOT APPLY any lotions, deodorants, cologne, or perfumes.   Do not bring valuables to the hospital. [facility] is not responsible for any belongings/valuables. Do not wear nail polish, gel polish, artificial nails, or any other type of covering on natural nails (fingers and toes) Do not wear jewelry or makeup Put on clean/comfortable clothes.  Please brush your teeth.  Ask your nurse before applying any prescription medications to the skin.     CHG Compatible Lotions   Aveeno Moisturizing lotion  Cetaphil Moisturizing Cream  Cetaphil Moisturizing Lotion  Clairol Herbal Essence Moisturizing Lotion, Dry Skin  Clairol Herbal Essence Moisturizing Lotion, Extra Dry Skin  Clairol Herbal Essence Moisturizing Lotion, Normal Skin  Curel Age Defying  Therapeutic Moisturizing Lotion with Alpha Hydroxy  Curel Extreme Care Body Lotion  Curel Soothing Hands Moisturizing Hand Lotion  Curel Therapeutic Moisturizing Cream, Fragrance-Free  Curel Therapeutic Moisturizing Lotion, Fragrance-Free  Curel Therapeutic Moisturizing Lotion, Original Formula  Eucerin Daily Replenishing Lotion  Eucerin Dry Skin Therapy Plus Alpha Hydroxy Crme  Eucerin Dry Skin Therapy Plus Alpha Hydroxy Lotion  Eucerin Original Crme  Eucerin Original Lotion  Eucerin Plus Crme Eucerin Plus Lotion  Eucerin TriLipid Replenishing Lotion  Keri Anti-Bacterial Hand Lotion  Keri Deep Conditioning Original Lotion Dry Skin Formula Softly Scented  Keri Deep Conditioning Original Lotion, Fragrance Free Sensitive Skin Formula  Keri Lotion Fast Absorbing Fragrance Free Sensitive Skin Formula  Keri Lotion Fast Absorbing Softly Scented Dry Skin Formula  Keri Original Lotion  Keri Skin Renewal Lotion Keri Silky Smooth Lotion  Keri Silky Smooth Sensitive Skin Lotion  Nivea Body Creamy Conditioning Oil  Nivea Body Extra Enriched Lotion  Nivea Body Original Lotion  Nivea Body Sheer Moisturizing Lotion Nivea Crme  Nivea Skin Firming Lotion  NutraDerm 30 Skin Lotion  NutraDerm Skin Lotion  NutraDerm Therapeutic Skin Cream  NutraDerm Therapeutic Skin Lotion  ProShield Protective Hand Cream  Provon moisturizing lotion  Please read over the following fact sheets that you were given.

## 2024-01-27 ENCOUNTER — Telehealth (HOSPITAL_BASED_OUTPATIENT_CLINIC_OR_DEPARTMENT_OTHER): Payer: Self-pay | Admitting: Orthopaedic Surgery

## 2024-01-27 ENCOUNTER — Ambulatory Visit: Payer: Self-pay

## 2024-01-27 ENCOUNTER — Inpatient Hospital Stay (HOSPITAL_COMMUNITY)
Admission: RE | Admit: 2024-01-27 | Discharge: 2024-01-27 | Disposition: A | Discharge status: Home / Self Care | Source: Ambulatory Visit

## 2024-01-27 NOTE — Telephone Encounter (Signed)
 Patient daughters wants to get her resch for her surgery she states she is not doing well at all. I also transferred her to April to get this resch.

## 2024-01-27 NOTE — Telephone Encounter (Signed)
 FYI Only or Action Required?: FYI only for provider.  Patient was last seen in primary care on 11/26/2023 by Plotnikov, Karlynn GAILS, MD.  Called Nurse Triage reporting Shortness of Breath.  Symptoms began several weeks ago.  Interventions attempted: Nothing.  Symptoms are: gradually worsening.  Triage Disposition: Call EMS 911 Now  Patient/caregiver understands and will follow disposition?: Yes     Copied from CRM #[id number]. Topic: Clinical - Red Word Triage >> Jan 27, 2024  8:20 AM Turkey A wrote: [facility] that prompted transfer to Nurse Triage: Patient's daughter called patient is very dizzy has shortness of breath, and pain in her shoulder which she is having surgery for. Patient has pain has pain in her legs. Reason for Disposition  SEVERE difficulty breathing (e.g., struggling for each breath, speaks in single words)  Answer Assessment - Initial Assessment Questions 1. RESPIRATORY STATUS: Describe your breathing? (e.g., wheezing, shortness of breath, unable to speak, severe coughing)      Shortness of breath, can speak, wheezing (comes and goes) 2. ONSET: When did this breathing problem begin?      Hx cardiology patient, sob for a long time, on Sunday they cancelled the coumadin  and she got worse.  She became dizzy, constantly.  She is scheduled for surgery on Thursday for ortho 3. PATTERN Does the difficult breathing come and go, or has it been constant since it started?      Comes and goes with movement, 4. SEVERITY: How bad is your breathing? (e.g., mild, moderate, severe)      severe 5. RECURRENT SYMPTOM: Have you had difficulty breathing before? If Yes, ask: When was the last time? and What happened that time?      yes 6. CARDIAC HISTORY: Do you have any history of heart disease? (e.g., heart attack, angina, bypass surgery, angioplasty)      Bypass surgery 7. LUNG HISTORY: Do you have any history of lung disease?  (e.g., pulmonary embolus, asthma,  emphysema)     denies 8. CAUSE: What do you think is causing the breathing problem?      unknown 9. OTHER SYMPTOMS: Do you have any other symptoms? (e.g., chest pain, cough, dizziness, fever, runny nose)     Dizziness, chest pain within the last two weeks. 10. O2 SATURATION MONITOR:  Do you use an oxygen saturation monitor (pulse oximeter) at home? If Yes, ask: What is your reading (oxygen level) today? What is your usual oxygen saturation reading? (e.g., 95%)       na 11. PREGNANCY: Is there any chance you are pregnant? When was your last menstrual period?       na 12. TRAVEL: Have you traveled out of the country in the last month? (e.g., travel history, exposures)       no  Protocols used: Breathing Difficulty-A-AH

## 2024-01-28 DIAGNOSIS — R0602 Shortness of breath: Secondary | ICD-10-CM | POA: Diagnosis not present

## 2024-01-28 DIAGNOSIS — R0609 Other forms of dyspnea: Secondary | ICD-10-CM | POA: Diagnosis not present

## 2024-01-30 DIAGNOSIS — R42 Dizziness and giddiness: Secondary | ICD-10-CM | POA: Diagnosis not present

## 2024-02-04 ENCOUNTER — Telehealth: Payer: Self-pay

## 2024-02-04 NOTE — Telephone Encounter (Signed)
 Pt's daughter, Charlie, reports pt did not have scheduled shoulder surgery due to not feeling well. She reports she took pt to her cardiologist, with [facility], for evaluation due to SOB. She reports they were concerned that pt was only going to inject Lovenox  once daily before and after the surgery. They said pt should be injecting twice daily. Advised this dosing was due to pt's kidney function and is best practice. She reports she does not think the cardiologist knew anything about her kidney function. She reports she will make sure they are aware. She reports right now she would like the coumadin  clinic to provide Lovenox  bridge instructions for RS surgery but may have to get instructions from [facility] cardiologist.   Surgery is now rescheduled for 11/3, but she may push it back even further due to them going out of the country and returning so close to the surgery date. They will be out of the country from 9/28 to 10/30.   She reported pt only missed one dose of warfarin. Pt was scheduled for INR check f/u after surgery for 9/12. She will not need this apt now and it has been Rs for 9/26. Advised if any other changes to contact the coumadin  clinic. [name] verbalized understanding.

## 2024-02-06 ENCOUNTER — Ambulatory Visit: Payer: Self-pay

## 2024-02-06 ENCOUNTER — Ambulatory Visit

## 2024-02-06 DIAGNOSIS — Z79899 Other long term (current) drug therapy: Secondary | ICD-10-CM | POA: Diagnosis not present

## 2024-02-06 NOTE — Telephone Encounter (Signed)
 FYI Only or Action Required?: FYI only for provider.  Patient was last seen in primary care on 11/26/2023 by Plotnikov, Karlynn GAILS, MD.  Called Nurse Triage reporting Altered Mental Status.  Symptoms began several weeks ago.  Interventions attempted: Nothing.  Symptoms are: gradually worsening.  Triage Disposition: See Physician Within 24 Hours  Patient/caregiver understands and will follow disposition?: No Reason for Disposition  [1] Confusion getting worse AND [2] slow onset (days to weeks)  Answer Assessment - Initial Assessment Questions Patients daughter calling on behalf of patient. Daughter was advised from cardiology to see PCP for evaluation of possible UTI as that can cause confusion in the elderly. Advised UC as it is the weekend and to get urinalysis competed., patients daughter said no no no no, cannot go to UC, means picking up covid, flu, whatever else, will cause more stress than good will wait to call back Monday. Patients daughter adamant on booking with PCP, scheduled first available with PCP.  1. MAIN CONCERN OR SYMPTOM:  What is your main concern right now? What questions do you have? What's the main symptom you're worried about? (e.g., confusion, memory loss)     Memory worsening, and sudden emotional outbursts, patient's daughter stated she never cries, and in last 3 days she cried 3 times.  2. ONSET:  When did the symptom start (or worsen)? (minutes, hours, days, weeks)     Worsening within last week, not remembering things from moment to moment  3. BETTER-SAME-WORSE: Are you (the patient) getting better, staying the same, or getting worse compared to the day you (they) were diagnosed or most recent hospital discharge?     Worse  Protocols used: Dementia Symptoms and Questions-A-AH  Copied from CRM #[id number]. Topic: Clinical - Red Word Triage >> Feb 06, 2024 12:21 PM Pinkey ORN wrote: Red Word that prompted transfer to Nurse Triage: UTI >> Feb 06, 2024 12:23 PM Pinkey ORN wrote: Patient's daughter Charlie called on behalf of patient states she possibly has an UTI. States the only symptoms shown is a change in her behavior.

## 2024-02-09 NOTE — Telephone Encounter (Signed)
 Alexis [name] contacted coumadin  clinic to report [facility] cardiology is advising they agree to transition pt to Eliquis, even though this will be off label. INR will need to be at 2.0 or below before starting Eliquis. Alexis [name] reports she will not give pt any warfarin today and would like INR check tomorrow.  Placed pt on schedule for INR check for tomorrow.   Cardiology note reports they will instruct pt on Eliquis dosing around surgery.

## 2024-02-10 ENCOUNTER — Ambulatory Visit (INDEPENDENT_AMBULATORY_CARE_PROVIDER_SITE_OTHER)

## 2024-02-10 ENCOUNTER — Ambulatory Visit: Admitting: Internal Medicine

## 2024-02-10 ENCOUNTER — Encounter: Payer: Self-pay | Admitting: Internal Medicine

## 2024-02-10 ENCOUNTER — Ambulatory Visit

## 2024-02-10 VITALS — BP 122/70 | HR 57 | Temp 98.3°F | Ht 59.0 in | Wt 169.2 lb

## 2024-02-10 DIAGNOSIS — I951 Orthostatic hypotension: Secondary | ICD-10-CM

## 2024-02-10 DIAGNOSIS — F0280 Dementia in other diseases classified elsewhere without behavioral disturbance: Secondary | ICD-10-CM | POA: Diagnosis not present

## 2024-02-10 DIAGNOSIS — R7881 Bacteremia: Secondary | ICD-10-CM | POA: Diagnosis not present

## 2024-02-10 DIAGNOSIS — Z7901 Long term (current) use of anticoagulants: Secondary | ICD-10-CM | POA: Diagnosis not present

## 2024-02-10 DIAGNOSIS — Z23 Encounter for immunization: Secondary | ICD-10-CM | POA: Diagnosis not present

## 2024-02-10 DIAGNOSIS — R531 Weakness: Secondary | ICD-10-CM

## 2024-02-10 DIAGNOSIS — G309 Alzheimer's disease, unspecified: Secondary | ICD-10-CM

## 2024-02-10 DIAGNOSIS — R197 Diarrhea, unspecified: Secondary | ICD-10-CM | POA: Diagnosis not present

## 2024-02-10 DIAGNOSIS — I10 Essential (primary) hypertension: Secondary | ICD-10-CM

## 2024-02-10 DIAGNOSIS — B955 Unspecified streptococcus as the cause of diseases classified elsewhere: Secondary | ICD-10-CM

## 2024-02-10 LAB — URINALYSIS, ROUTINE W REFLEX MICROSCOPIC
Bilirubin Urine: NEGATIVE
Hgb urine dipstick: NEGATIVE
Ketones, ur: NEGATIVE
Nitrite: NEGATIVE
Specific Gravity, Urine: <=1.005 — AB (ref 1.000–1.030)
Total Protein, Urine: NEGATIVE
Urine Glucose: NEGATIVE
Urobilinogen, UA: 0.2 (ref 0.0–1.0)
pH: 6 (ref 5.0–8.0)

## 2024-02-10 LAB — POCT INR: INR: 2.2 (ref 2.0–3.0)

## 2024-02-10 NOTE — Assessment & Plan Note (Signed)
 Hydrate well. Off Coumadin 

## 2024-02-10 NOTE — Assessment & Plan Note (Signed)
 Doing well

## 2024-02-10 NOTE — Assessment & Plan Note (Signed)
 No relapse on Budesonide  2/day

## 2024-02-10 NOTE — Assessment & Plan Note (Signed)
 Going to start Eliquis again per [facility], Off Coumadin 

## 2024-02-10 NOTE — Assessment & Plan Note (Signed)
 On Toprol .  Blood pressure is controlled

## 2024-02-10 NOTE — Telephone Encounter (Signed)
 Noted! Thank you

## 2024-02-10 NOTE — Assessment & Plan Note (Signed)
 Recent worsening NS changes, crying, SOB, wheezing and more confusion. Surgery on 01/29/24 was cancelled and rescheduled. They saw Cardiology - no new issues. Hydrating her for possible dehydration. Check UA

## 2024-02-10 NOTE — Progress Notes (Signed)
 Subjective:  Patient ID: Alexis [name], female    DOB: [birth year]  Age: 89 y.o. MRN: [id number]  CC: Medical Management of Chronic Issues (States she is here for confusion as well as pain in legs. )   HPI Alexis [name] presents for worsening MS changes, crying, SOB, wheezing and more confusion. Surgery on 01/29/24 was cancelled. They saw Cardiology - no new issues.  She is here with her daughter.  Outpatient Medications Prior to Visit  Medication Sig Dispense Refill   acetaminophen  (TYLENOL ) 500 MG tablet Take 500 mg by mouth every 6 (six) hours as needed for moderate pain (pain score 4-6).     allopurinol  (ZYLOPRIM ) 100 MG tablet Take 0.5 tablets (50 mg total) by mouth daily. Annual appt is due must see provider for future refills 45 tablet 3   aspirin  EC 81 MG tablet Take 81 mg by mouth daily.     atorvastatin  (LIPITOR) 10 MG tablet Take 1 tablet (10 mg total) by mouth at bedtime. 90 tablet 3   B Complex CAPS Take 1 capsule by mouth every evening.     budesonide  (ENTOCORT EC ) 3 MG 24 hr capsule Take 3 capsules (9 mg total) by mouth daily. (Patient taking differently: Take 6 mg by mouth daily.) 90 capsule 5   Cholecalciferol  (VITAMIN D -3) 125 MCG (5000 UT) TABS Take 5,000 Units by mouth daily.     escitalopram  (LEXAPRO ) 5 MG tablet Take 1 tablet (5 mg total) by mouth daily. 90 tablet 1   furosemide  (LASIX ) 40 MG tablet Take 40 mg by mouth 2 (two) times daily.     melatonin 3 MG TABS tablet Take 3 mg by mouth at bedtime as needed (sleep).     metoprolol  succinate (TOPROL -XL) 50 MG 24 hr tablet Take 1 tablet (50 mg total) by mouth daily. Overdue for appt due must see provider for future refills 90 tablet 3   potassium chloride  SA (KLOR-CON  M) 20 MEQ tablet Take 1 tablet (20 mEq total) by mouth daily. 90 tablet 1   traMADol  (ULTRAM ) 50 MG tablet Take 1-2 tablets (50-100 mg total) by mouth every 12 (twelve) hours as needed. 120 tablet 1   triamcinolone  cream (KENALOG ) 0.1 % Apply 1  Application topically 2 (two) times daily. 160 g 3   butalbital -acetaminophen -caffeine  (FIORICET) 50-325-40 MG tablet Take 1-2 tablets by mouth every 6 (six) hours as needed for headache. (Patient not taking: Reported on 02/10/2024) 60 tablet 0   Cholecalciferol  (VITAMIN D3) 50 MCG (2000 UT) capsule Take 1 capsule (2,000 Units total) by mouth daily. (Patient not taking: Reported on 02/10/2024) 100 capsule 3   diphenoxylate -atropine  (LOMOTIL ) 2.5-0.025 MG tablet Take 1 tablet by mouth 4 (four) times daily as needed for diarrhea or loose stools. (Patient not taking: Reported on 02/10/2024) 60 tablet 1   enoxaparin  (LOVENOX ) 80 MG/0.8ML injection Inject 0.8 mLs (80 mg total) into the skin daily. USE AS DIRECTED BY ANTICOAGULATION CLINIC 7.2 mL 0   erythromycin  ophthalmic ointment Place 1 Application into the left eye 3 (three) times daily. (Patient not taking: Reported on 02/10/2024) 3.5 g 0   mineral oil-hydrophilic petrolatum (AQUAPHOR) ointment Apply topically as needed for dry skin. (Patient not taking: Reported on 02/10/2024) 420 g 3   nitrofurantoin , macrocrystal-monohydrate, (MACROBID ) 100 MG capsule Take 1 capsule (100 mg total) by mouth 2 (two) times daily. (Patient not taking: Reported on 02/10/2024) 20 capsule 0   warfarin (COUMADIN ) 1 MG tablet TAKE 1 TABLET BY MOUTH DAILY EXCEPT TAKE 1 1/2  TABLETS ON MONDAY AND FRIDAY OR AS DIRECTED BY ANTICOAGULATION CLINIC (Patient not taking: Reported on 02/10/2024) 115 tablet 1   doxycycline  (VIBRA -TABS) 100 MG tablet Take 1 tablet (100 mg total) by mouth 2 (two) times daily. (Patient not taking: Reported on 02/10/2024) 10 tablet 1   No facility-administered medications prior to visit.    ROS: Review of Systems  Constitutional:  Negative for activity change, appetite change, chills, fatigue and unexpected weight change.  HENT:  Negative for congestion, mouth sores and sinus pressure.   Eyes:  Negative for visual disturbance.  Respiratory:  Negative for cough  and chest tightness.   Cardiovascular:  Negative for leg swelling.  Gastrointestinal:  Negative for abdominal pain and nausea.  Genitourinary:  Negative for difficulty urinating, frequency and vaginal pain.  Musculoskeletal:  Positive for arthralgias. Negative for back pain and gait problem.  Skin:  Negative for pallor and rash.  Neurological:  Negative for dizziness, tremors, weakness, numbness and headaches.  Psychiatric/Behavioral:  Positive for agitation, behavioral problems, confusion and decreased concentration. Negative for hallucinations, self-injury, sleep disturbance and suicidal ideas. The patient is not nervous/anxious.     Objective:  BP 122/70   Pulse (!) 57   Temp 98.3 F (36.8 C) (Oral)   Ht 4' 11 (1.499 m)   Wt 169 lb 3.2 oz (76.7 kg)   SpO2 94%   BMI 34.17 kg/m   BP Readings from Last 3 Encounters:  02/10/24 122/70  11/26/23 102/78  10/15/23 128/74    Wt Readings from Last 3 Encounters:  02/10/24 169 lb 3.2 oz (76.7 kg)  07/18/23 172 lb (78 kg)  07/04/23 172 lb (78 kg)    Physical Exam Constitutional:      General: She is not in acute distress.    Appearance: She is well-developed. She is not toxic-appearing.  HENT:     Head: Normocephalic.     Right Ear: External ear normal.     Left Ear: External ear normal.     Nose: Nose normal.  Eyes:     General:        Right eye: No discharge.        Left eye: No discharge.     Conjunctiva/sclera: Conjunctivae normal.     Pupils: Pupils are equal, round, and reactive to light.  Neck:     Thyroid : No thyromegaly.     Vascular: No JVD.     Trachea: No tracheal deviation.  Cardiovascular:     Rate and Rhythm: Normal rate and regular rhythm.     Heart sounds: Normal heart sounds.  Pulmonary:     Effort: No respiratory distress.     Breath sounds: No stridor. No wheezing.  Abdominal:     General: Bowel sounds are normal. There is no distension.     Palpations: Abdomen is soft. There is no mass.      Tenderness: There is no abdominal tenderness. There is no guarding or rebound.  Musculoskeletal:        General: No tenderness.     Cervical back: Normal range of motion and neck supple. No rigidity.     Right lower leg: No edema.     Left lower leg: No edema.  Lymphadenopathy:     Cervical: No cervical adenopathy.  Skin:    Findings: No erythema or rash.  Neurological:     Mental Status: Mental status is at baseline. She is disoriented.     Cranial Nerves: No cranial nerve deficit.  Motor: No abnormal muscle tone.     Coordination: Coordination abnormal.     Gait: Gait abnormal.     Deep Tendon Reflexes: Reflexes normal.  Psychiatric:        Behavior: Behavior normal.        Thought Content: Thought content normal.   In a w/c The patient is pleasantly confused.  Alert, cooperative  Lab Results  Component Value Date   WBC 9.4 10/15/2023   HGB 12.8 10/15/2023   HCT 38.2 10/15/2023   PLT 292.0 10/15/2023   GLUCOSE 100 (H) 10/15/2023   CHOL 183 03/03/2023   TRIG 227.0 (H) 03/03/2023   HDL 52.10 03/03/2023   LDLDIRECT 90.0 01/10/2021   LDLCALC 86 03/03/2023   ALT 13 10/15/2023   AST 19 10/15/2023   NA 140 10/15/2023   K 3.7 10/15/2023   CL 103 10/15/2023   CREATININE 1.27 (H) 10/15/2023   BUN 22 10/15/2023   CO2 28 10/15/2023   TSH 3.88 10/15/2023   INR 2.2 02/10/2024   HGBA1C 6.0 03/03/2023    No results found.  Assessment & Plan:   Problem List Items Addressed This Visit     Alzheimer disease (HCC) - Primary   Recent worsening NS changes, crying, SOB, wheezing and more confusion. Surgery on 01/29/24 was cancelled and rescheduled. They saw Cardiology - no new issues. Hydrating her for possible dehydration. Check UA      RESOLVED: Bacteremia due to Streptococcus   Going to start Eliquis again per [facility], Off Coumadin       Diarrhea   No relapse on Budesonide  2/day      Essential hypertension   On Toprol .  Blood pressure is controlled       Orthostatic hypotension   Doing well      Weakness   Hydrate well. Off Coumadin          No orders of the defined types were placed in this encounter.     Follow-up: Return in about 3 months (around 05/11/2024) for a follow-up visit.  Marolyn Noel, MD

## 2024-02-10 NOTE — Patient Instructions (Signed)
Pre visit review using our clinic review tool, if applicable. No additional management support is needed unless otherwise documented below in the visit note. 

## 2024-02-10 NOTE — Progress Notes (Addendum)
 [facility] cardiology is recommending pt transition to Eliquis, even though this will be off label. INR will need to be at 2.0 or below before starting Eliquis. [name] reports she did not give pt any warfarin yesterday and is in today to check INR if less than 2.0. pt is still scheduled for shoulder surgery on 11/3 and cardiology note reports they will manage Eliquis around surgery.  Pt also has an apt with PCP today. INR is 2.2 today, higher than needed to start Eliquis. Advised to discuss with PCP today to inquire if pt should have another INR check tomorrow with a lab or if he has another suggestion. [name] verbalized understanding.   No warfarin dosing instructions were given. Advised to f/u with PCP today and also [facility] cardiology concerning when to start Eliquis.  Resolved anticoagulation encounters.  Medical screening examination/treatment/procedure(s) were performed by non-physician practitioner and as supervising physician I was immediately available for consultation/collaboration.  I agree with above. Karlynn Noel, MD

## 2024-02-12 ENCOUNTER — Telehealth: Payer: Self-pay | Admitting: Radiology

## 2024-02-12 ENCOUNTER — Encounter (HOSPITAL_BASED_OUTPATIENT_CLINIC_OR_DEPARTMENT_OTHER): Admitting: Orthopaedic Surgery

## 2024-02-12 NOTE — Telephone Encounter (Signed)
 Message sent to patient

## 2024-02-12 NOTE — Telephone Encounter (Signed)
 Copied from CRM #[id number]. Topic: Clinical - Medication Question >> Feb 11, 2024  3:46 PM Franky GRADE wrote: Reason for CRM: Patient's daughter is calling because she noticed nitrofurantoin , macrocrystal-monohydrate, (MACROBID ) 100 MG capsule [[id number]] on patient's list after visit on 02/10/2024. She is aware patient had a urine test but they have not gotten a call with the results and are wondering if this medication was prescribed based on the results.

## 2024-02-13 ENCOUNTER — Telehealth: Payer: Self-pay

## 2024-02-13 ENCOUNTER — Other Ambulatory Visit: Payer: Self-pay | Admitting: Internal Medicine

## 2024-02-13 NOTE — Telephone Encounter (Signed)
 Copied from CRM [id number]. Topic: Clinical - Lab/Test Results >> Feb 13, 2024  9:27 AM Tonda B wrote: Reason for CRM: please call pt to go over results [phone] (M)

## 2024-02-13 NOTE — Telephone Encounter (Signed)
 Message sent to patient informing her I can not go over results without provider viewing them and leaving his comment.

## 2024-02-16 ENCOUNTER — Ambulatory Visit: Payer: Self-pay | Admitting: Internal Medicine

## 2024-02-17 NOTE — Telephone Encounter (Signed)
  I replied-  Dear Denman, Your mom's urine test was okay. Sincerely, AP

## 2024-02-18 ENCOUNTER — Telehealth: Payer: Self-pay | Admitting: Radiology

## 2024-02-18 NOTE — Telephone Encounter (Signed)
 Copied from CRM #[id number]. Topic: Clinical - Medication Question >> Feb 18, 2024 10:34 AM Dedra B wrote: Reason for CRM: Pt daughter called to follow up on pt refill request that was placed 9/19.

## 2024-02-20 ENCOUNTER — Ambulatory Visit

## 2024-02-20 MED ORDER — POTASSIUM CHLORIDE CRYS ER 20 MEQ PO TBCR: 20.0000 meq | EXTENDED_RELEASE_TABLET | Freq: Every day | ORAL | 1 refills | Status: AC

## 2024-02-20 MED ORDER — BUDESONIDE 3 MG PO CPEP: 6.0000 mg | ORAL_CAPSULE | Freq: Every day | ORAL | 1 refills | Status: AC

## 2024-02-20 MED ORDER — METOPROLOL SUCCINATE ER 50 MG PO TB24: 50.0000 mg | ORAL_TABLET | Freq: Every day | ORAL | 1 refills | Status: AC

## 2024-02-20 NOTE — Telephone Encounter (Signed)
 Patient daughter, okay per DPR, called in needing RX of patient medications. She has called Pharmacy who has told her that since 9/19 and 9/21 but we only have the one request on 9/26 9:41am for the Motporol xl.   Reviewed in detail medications need for refill, refilled at preferred pharmacy for 90 days with 1 refill as patient due back in 3 months for follow up.

## 2024-02-20 NOTE — Addendum Note (Signed)
 Addended by: CLEATUS SUZEN SAILOR on: 02/20/2024 10:32 AM   Modules accepted: Orders

## 2024-03-10 ENCOUNTER — Telehealth: Payer: Self-pay | Admitting: *Deleted

## 2024-03-10 DIAGNOSIS — I509 Heart failure, unspecified: Secondary | ICD-10-CM

## 2024-03-10 NOTE — Progress Notes (Signed)
 Complex Care Management Note Care Guide Note  03/10/2024 Name: Alexis [name] MRN: [id number] DOB: 1933/08/14   Complex Care Management Outreach Attempts: An unsuccessful telephone outreach was attempted today to offer the patient information about available complex care management services.  Used [employer] PI#[id number] named Alexis [name]  Follow Up Plan:  Additional outreach attempts will be made to offer the patient complex care management information and services.   Encounter Outcome:  No Answer   Alexis [name]  [facility]  [facility], [facility] Guide  Direct Dial: [phone]  Fax [phone]

## 2024-03-16 NOTE — Progress Notes (Unsigned)
 Complex Care Management Note Care Guide Note  03/16/2024 Name: Alexis [name] MRN: [id number] DOB: [birth year]   Complex Care Management Outreach Attempts: A second unsuccessful outreach was attempted today to offer the patient with information about available complex care management services.  Follow Up Plan:  Additional outreach attempts will be made to offer the patient complex care management information and services.   Encounter Outcome:  No Answer  Harlene Satterfield  [facility]  [facility], [facility] Guide  Direct Dial: [phone]  Fax [phone]

## 2024-03-18 NOTE — Progress Notes (Signed)
 Complex Care Management Note Care Guide Note  03/18/2024 Name: Alexis [name] MRN: [id number] DOB: February 15, 1934   Complex Care Management Outreach Attempts: A third unsuccessful outreach was attempted today to offer the patient with information about available complex care management services.  Follow Up Plan:  No further outreach attempts will be made at this time. We have been unable to contact the patient to offer or enroll patient in complex care management services.  Encounter Outcome:  No Answer  Harlene Satterfield  [facility]  [facility], [facility] Guide  Direct Dial: [phone]  Fax [phone]

## 2024-03-29 ENCOUNTER — Encounter (HOSPITAL_COMMUNITY): Admission: RE | Payer: Self-pay | Source: Home / Self Care

## 2024-03-29 ENCOUNTER — Ambulatory Visit (HOSPITAL_COMMUNITY): Admission: RE | Admit: 2024-03-29 | Source: Home / Self Care | Admitting: Orthopaedic Surgery

## 2024-03-29 ENCOUNTER — Encounter: Payer: Self-pay | Admitting: Radiology

## 2024-03-29 SURGERY — ARTHROPLASTY, SHOULDER, TOTAL, REVERSE
Anesthesia: General | Site: Shoulder | Laterality: Right

## 2024-04-08 ENCOUNTER — Encounter (HOSPITAL_BASED_OUTPATIENT_CLINIC_OR_DEPARTMENT_OTHER): Admitting: Orthopaedic Surgery
